# Patient Record
Sex: Male | Born: 1950
Health system: Southern US, Community
[De-identification: ages and names within clinical notes are randomized; demographics above are authoritative.]

## PROBLEM LIST (undated history)

## (undated) DIAGNOSIS — M1990 Unspecified osteoarthritis, unspecified site: Secondary | ICD-10-CM

## (undated) DIAGNOSIS — R112 Nausea with vomiting, unspecified: Secondary | ICD-10-CM

## (undated) DIAGNOSIS — K5730 Diverticulosis of large intestine without perforation or abscess without bleeding: Secondary | ICD-10-CM

## (undated) DIAGNOSIS — I2510 Atherosclerotic heart disease of native coronary artery without angina pectoris: Secondary | ICD-10-CM

## (undated) DIAGNOSIS — H269 Unspecified cataract: Secondary | ICD-10-CM

## (undated) DIAGNOSIS — Z5189 Encounter for other specified aftercare: Secondary | ICD-10-CM

## (undated) DIAGNOSIS — D509 Iron deficiency anemia, unspecified: Secondary | ICD-10-CM

## (undated) DIAGNOSIS — C801 Malignant (primary) neoplasm, unspecified: Secondary | ICD-10-CM

## (undated) DIAGNOSIS — IMO0002 Reserved for concepts with insufficient information to code with codable children: Secondary | ICD-10-CM

## (undated) DIAGNOSIS — E785 Hyperlipidemia, unspecified: Secondary | ICD-10-CM

## (undated) DIAGNOSIS — M545 Low back pain, unspecified: Secondary | ICD-10-CM

## (undated) DIAGNOSIS — N189 Chronic kidney disease, unspecified: Secondary | ICD-10-CM

## (undated) DIAGNOSIS — R945 Abnormal results of liver function studies: Principal | ICD-10-CM

## (undated) DIAGNOSIS — N200 Calculus of kidney: Secondary | ICD-10-CM

## (undated) DIAGNOSIS — E119 Type 2 diabetes mellitus without complications: Secondary | ICD-10-CM

## (undated) DIAGNOSIS — K219 Gastro-esophageal reflux disease without esophagitis: Secondary | ICD-10-CM

## (undated) DIAGNOSIS — K759 Inflammatory liver disease, unspecified: Secondary | ICD-10-CM

## (undated) DIAGNOSIS — Z9889 Other specified postprocedural states: Secondary | ICD-10-CM

## (undated) DIAGNOSIS — I10 Essential (primary) hypertension: Secondary | ICD-10-CM

## (undated) HISTORY — DX: Gastro-esophageal reflux disease without esophagitis: K21.9

## (undated) HISTORY — DX: Encounter for other specified aftercare: Z51.89

## (undated) HISTORY — DX: Unspecified cataract: H26.9

## (undated) HISTORY — DX: Type 2 diabetes mellitus without complications: E11.9

## (undated) HISTORY — DX: Diverticulosis of large intestine without perforation or abscess without bleeding: K57.30

## (undated) HISTORY — DX: Iron deficiency anemia, unspecified: D50.9

## (undated) HISTORY — PX: COLONOSCOPY: SHX174

## (undated) HISTORY — DX: Reserved for concepts with insufficient information to code with codable children: IMO0002

## (undated) HISTORY — PX: OTHER SURGICAL HISTORY: SHX169

## (undated) HISTORY — DX: Essential (primary) hypertension: I10

## (undated) HISTORY — DX: Atherosclerotic heart disease of native coronary artery without angina pectoris: I25.10

## (undated) HISTORY — DX: Low back pain, unspecified: M54.50

## (undated) HISTORY — DX: Calculus of kidney: N20.0

## (undated) HISTORY — PX: UPPER GASTROINTESTINAL ENDOSCOPY: SHX188

## (undated) HISTORY — DX: Malignant (primary) neoplasm, unspecified: C80.1

## (undated) HISTORY — DX: Hyperlipidemia, unspecified: E78.5

## (undated) HISTORY — DX: Abnormal results of liver function studies: R94.5

## (undated) HISTORY — PX: CORONARY ANGIOPLASTY WITH STENT PLACEMENT: SHX49

## (undated) HISTORY — DX: Low back pain: M54.5

---

## 1998-08-08 ENCOUNTER — Emergency Department (HOSPITAL_COMMUNITY): Admission: EM | Admit: 1998-08-08 | Discharge: 1998-08-08 | Payer: Self-pay | Admitting: Emergency Medicine

## 1998-08-08 ENCOUNTER — Ambulatory Visit (HOSPITAL_COMMUNITY): Admission: RE | Admit: 1998-08-08 | Discharge: 1998-08-08 | Payer: Self-pay | Admitting: Endocrinology

## 1998-09-13 ENCOUNTER — Other Ambulatory Visit: Admission: RE | Admit: 1998-09-13 | Discharge: 1998-09-13 | Payer: Self-pay | Admitting: Gastroenterology

## 2002-11-25 ENCOUNTER — Encounter: Payer: Self-pay | Admitting: Endocrinology

## 2002-11-25 ENCOUNTER — Ambulatory Visit (HOSPITAL_COMMUNITY): Admission: RE | Admit: 2002-11-25 | Discharge: 2002-11-25 | Payer: Self-pay | Admitting: Endocrinology

## 2004-10-05 ENCOUNTER — Ambulatory Visit: Payer: Self-pay | Admitting: Endocrinology

## 2004-10-12 ENCOUNTER — Ambulatory Visit: Payer: Self-pay | Admitting: Endocrinology

## 2004-11-14 ENCOUNTER — Ambulatory Visit: Payer: Self-pay | Admitting: *Deleted

## 2004-11-21 ENCOUNTER — Ambulatory Visit: Payer: Self-pay | Admitting: Internal Medicine

## 2004-11-30 ENCOUNTER — Ambulatory Visit: Payer: Self-pay

## 2004-11-30 HISTORY — PX: OTHER SURGICAL HISTORY: SHX169

## 2004-12-07 ENCOUNTER — Ambulatory Visit: Payer: Self-pay | Admitting: Cardiology

## 2004-12-07 ENCOUNTER — Ambulatory Visit: Payer: Self-pay | Admitting: *Deleted

## 2004-12-13 ENCOUNTER — Ambulatory Visit (HOSPITAL_COMMUNITY): Admission: RE | Admit: 2004-12-13 | Discharge: 2004-12-13 | Payer: Self-pay | Admitting: Cardiology

## 2004-12-13 ENCOUNTER — Ambulatory Visit: Payer: Self-pay | Admitting: *Deleted

## 2004-12-18 ENCOUNTER — Inpatient Hospital Stay (HOSPITAL_COMMUNITY): Admission: RE | Admit: 2004-12-18 | Discharge: 2004-12-20 | Payer: Self-pay | Admitting: *Deleted

## 2004-12-23 ENCOUNTER — Emergency Department (HOSPITAL_COMMUNITY): Admission: EM | Admit: 2004-12-23 | Discharge: 2004-12-23 | Payer: Self-pay | Admitting: Emergency Medicine

## 2004-12-28 ENCOUNTER — Ambulatory Visit: Payer: Self-pay | Admitting: Internal Medicine

## 2005-01-02 ENCOUNTER — Ambulatory Visit: Payer: Self-pay | Admitting: *Deleted

## 2005-02-21 ENCOUNTER — Ambulatory Visit: Payer: Self-pay | Admitting: *Deleted

## 2005-02-28 ENCOUNTER — Ambulatory Visit: Payer: Self-pay | Admitting: *Deleted

## 2005-03-22 ENCOUNTER — Ambulatory Visit: Payer: Self-pay | Admitting: Endocrinology

## 2005-05-16 ENCOUNTER — Ambulatory Visit: Payer: Self-pay | Admitting: Endocrinology

## 2005-05-24 ENCOUNTER — Ambulatory Visit: Payer: Self-pay | Admitting: *Deleted

## 2005-05-29 ENCOUNTER — Ambulatory Visit: Payer: Self-pay | Admitting: *Deleted

## 2005-09-16 ENCOUNTER — Ambulatory Visit: Payer: Self-pay | Admitting: *Deleted

## 2005-09-24 ENCOUNTER — Ambulatory Visit: Payer: Self-pay | Admitting: *Deleted

## 2005-09-27 ENCOUNTER — Ambulatory Visit: Payer: Self-pay | Admitting: Cardiology

## 2005-09-27 ENCOUNTER — Inpatient Hospital Stay (HOSPITAL_BASED_OUTPATIENT_CLINIC_OR_DEPARTMENT_OTHER): Admission: RE | Admit: 2005-09-27 | Discharge: 2005-09-27 | Payer: Self-pay | Admitting: Cardiology

## 2005-10-10 ENCOUNTER — Ambulatory Visit: Payer: Self-pay | Admitting: *Deleted

## 2005-10-14 ENCOUNTER — Ambulatory Visit: Payer: Self-pay | Admitting: Endocrinology

## 2005-10-22 ENCOUNTER — Ambulatory Visit: Payer: Self-pay | Admitting: Internal Medicine

## 2005-11-07 ENCOUNTER — Ambulatory Visit: Payer: Self-pay | Admitting: Internal Medicine

## 2005-11-07 HISTORY — PX: ESOPHAGOGASTRODUODENOSCOPY: SHX1529

## 2005-11-07 LAB — HM COLONOSCOPY

## 2005-12-16 ENCOUNTER — Ambulatory Visit: Payer: Self-pay | Admitting: Endocrinology

## 2006-01-20 ENCOUNTER — Ambulatory Visit: Payer: Self-pay | Admitting: Endocrinology

## 2006-02-05 ENCOUNTER — Ambulatory Visit: Payer: Self-pay | Admitting: *Deleted

## 2006-02-10 ENCOUNTER — Ambulatory Visit: Payer: Self-pay

## 2006-02-10 ENCOUNTER — Ambulatory Visit: Payer: Self-pay | Admitting: *Deleted

## 2006-02-11 ENCOUNTER — Ambulatory Visit: Payer: Self-pay | Admitting: Endocrinology

## 2006-02-13 ENCOUNTER — Ambulatory Visit: Payer: Self-pay | Admitting: Internal Medicine

## 2006-02-13 ENCOUNTER — Observation Stay (HOSPITAL_COMMUNITY): Admission: EM | Admit: 2006-02-13 | Discharge: 2006-02-14 | Payer: Self-pay | Admitting: *Deleted

## 2006-02-14 ENCOUNTER — Encounter: Payer: Self-pay | Admitting: Gastroenterology

## 2006-02-17 ENCOUNTER — Ambulatory Visit: Payer: Self-pay | Admitting: Endocrinology

## 2006-02-17 ENCOUNTER — Ambulatory Visit: Payer: Self-pay | Admitting: Gastroenterology

## 2006-02-24 ENCOUNTER — Ambulatory Visit (HOSPITAL_COMMUNITY): Admission: RE | Admit: 2006-02-24 | Discharge: 2006-02-24 | Payer: Self-pay | Admitting: Gastroenterology

## 2006-02-24 ENCOUNTER — Encounter: Payer: Self-pay | Admitting: Internal Medicine

## 2006-03-03 ENCOUNTER — Ambulatory Visit: Payer: Self-pay | Admitting: Endocrinology

## 2006-03-18 ENCOUNTER — Ambulatory Visit: Payer: Self-pay | Admitting: Internal Medicine

## 2006-04-21 ENCOUNTER — Ambulatory Visit: Payer: Self-pay | Admitting: Internal Medicine

## 2006-05-26 ENCOUNTER — Ambulatory Visit: Payer: Self-pay | Admitting: Endocrinology

## 2006-07-11 ENCOUNTER — Ambulatory Visit: Payer: Self-pay | Admitting: Endocrinology

## 2006-08-01 ENCOUNTER — Ambulatory Visit: Payer: Self-pay | Admitting: Endocrinology

## 2007-02-10 ENCOUNTER — Ambulatory Visit: Payer: Self-pay | Admitting: Endocrinology

## 2007-03-23 ENCOUNTER — Ambulatory Visit: Payer: Self-pay | Admitting: Endocrinology

## 2007-03-23 LAB — CONVERTED CEMR LAB
ALT: 30 units/L (ref 0–40)
AST: 24 units/L (ref 0–37)
Albumin: 4.3 g/dL (ref 3.5–5.2)
Alkaline Phosphatase: 39 units/L (ref 39–117)
BUN: 16 mg/dL (ref 6–23)
Basophils Absolute: 0.1 10*3/uL (ref 0.0–0.1)
Basophils Relative: 0.8 % (ref 0.0–1.0)
Bilirubin Urine: NEGATIVE
Bilirubin, Direct: 0.1 mg/dL (ref 0.0–0.3)
CO2: 33 meq/L — ABNORMAL HIGH (ref 19–32)
Calcium: 9.3 mg/dL (ref 8.4–10.5)
Chloride: 108 meq/L (ref 96–112)
Cholesterol: 153 mg/dL (ref 0–200)
Creatinine, Ser: 0.7 mg/dL (ref 0.4–1.5)
Eosinophils Absolute: 0.1 10*3/uL (ref 0.0–0.6)
Eosinophils Relative: 1.1 % (ref 0.0–5.0)
GFR calc Af Amer: 151 mL/min
GFR calc non Af Amer: 125 mL/min
Glucose, Bld: 98 mg/dL (ref 70–99)
HCT: 43.9 % (ref 39.0–52.0)
HDL: 45.8 mg/dL (ref >39.0)
Hemoglobin, Urine: NEGATIVE
Hemoglobin: 15.3 g/dL (ref 13.0–17.0)
Iron: 59 ug/dL (ref 42–165)
Ketones, ur: NEGATIVE mg/dL
LDL Cholesterol: 74 mg/dL (ref 0–99)
Leukocytes, UA: NEGATIVE
Lymphocytes Relative: 16.1 % (ref 12.0–46.0)
MCHC: 34.8 g/dL (ref 30.0–36.0)
MCV: 93.5 fL (ref 78.0–100.0)
Monocytes Absolute: 0.5 10*3/uL (ref 0.2–0.7)
Monocytes Relative: 7.8 % (ref 3.0–11.0)
Neutro Abs: 4.7 10*3/uL (ref 1.4–7.7)
Neutrophils Relative %: 74.2 % (ref 43.0–77.0)
Nitrite: NEGATIVE
PSA: 0.39 ng/mL (ref 0.10–4.00)
Platelets: 240 10*3/uL (ref 150–400)
Potassium: 4.4 meq/L (ref 3.5–5.1)
RBC: 4.69 M/uL (ref 4.22–5.81)
RDW: 13.7 % (ref 11.5–14.6)
Saturation Ratios: 14.4 % — ABNORMAL LOW (ref 20.0–50.0)
Sodium: 144 meq/L (ref 135–145)
Specific Gravity, Urine: 1.02 (ref 1.000–1.03)
TSH: 1.61 microintl units/mL (ref 0.35–5.50)
Total Bilirubin: 0.7 mg/dL (ref 0.3–1.2)
Total CHOL/HDL Ratio: 3.3
Total Protein, Urine: NEGATIVE mg/dL
Total Protein: 6.8 g/dL (ref 6.0–8.3)
Transferrin: 293.1 mg/dL (ref >=212.0)
Triglycerides: 168 mg/dL — ABNORMAL HIGH (ref 0–149)
Urine Glucose: NEGATIVE mg/dL
Urobilinogen, UA: 0.2 (ref 0.0–1.0)
VLDL: 34 mg/dL (ref 0–40)
WBC: 6.4 10*3/uL (ref 4.5–10.5)
pH: 6 (ref 5.0–8.0)

## 2007-03-30 ENCOUNTER — Ambulatory Visit: Payer: Self-pay | Admitting: Endocrinology

## 2007-03-30 HISTORY — PX: ELECTROCARDIOGRAM: SHX264

## 2007-06-02 ENCOUNTER — Encounter: Payer: Self-pay | Admitting: Endocrinology

## 2007-06-02 DIAGNOSIS — I10 Essential (primary) hypertension: Secondary | ICD-10-CM | POA: Insufficient documentation

## 2007-06-02 DIAGNOSIS — I2510 Atherosclerotic heart disease of native coronary artery without angina pectoris: Secondary | ICD-10-CM | POA: Insufficient documentation

## 2007-06-02 DIAGNOSIS — K219 Gastro-esophageal reflux disease without esophagitis: Secondary | ICD-10-CM | POA: Insufficient documentation

## 2007-06-02 DIAGNOSIS — E785 Hyperlipidemia, unspecified: Secondary | ICD-10-CM | POA: Insufficient documentation

## 2007-08-11 ENCOUNTER — Ambulatory Visit: Payer: Self-pay | Admitting: Cardiology

## 2007-08-17 ENCOUNTER — Ambulatory Visit: Payer: Self-pay

## 2007-11-10 ENCOUNTER — Encounter: Payer: Self-pay | Admitting: Internal Medicine

## 2007-11-19 ENCOUNTER — Telehealth (INDEPENDENT_AMBULATORY_CARE_PROVIDER_SITE_OTHER): Payer: Self-pay | Admitting: *Deleted

## 2007-11-20 ENCOUNTER — Ambulatory Visit: Payer: Self-pay | Admitting: Internal Medicine

## 2007-11-20 DIAGNOSIS — J069 Acute upper respiratory infection, unspecified: Secondary | ICD-10-CM | POA: Insufficient documentation

## 2008-01-20 ENCOUNTER — Ambulatory Visit: Payer: Self-pay | Admitting: Endocrinology

## 2008-01-20 DIAGNOSIS — E119 Type 2 diabetes mellitus without complications: Secondary | ICD-10-CM | POA: Insufficient documentation

## 2008-01-20 DIAGNOSIS — R05 Cough: Secondary | ICD-10-CM

## 2008-01-21 LAB — CONVERTED CEMR LAB: Hgb A1c MFr Bld: 5.8 % (ref 4.6–6.0)

## 2008-02-12 ENCOUNTER — Ambulatory Visit: Payer: Self-pay | Admitting: Endocrinology

## 2008-02-17 ENCOUNTER — Encounter: Payer: Self-pay | Admitting: Endocrinology

## 2008-05-19 ENCOUNTER — Ambulatory Visit: Payer: Self-pay | Admitting: Endocrinology

## 2008-06-06 ENCOUNTER — Ambulatory Visit: Payer: Self-pay | Admitting: Endocrinology

## 2008-06-07 LAB — CONVERTED CEMR LAB
ALT: 40 units/L (ref 0–53)
AST: 34 units/L (ref 0–37)
Albumin: 4.4 g/dL (ref 3.5–5.2)
Alkaline Phosphatase: 57 units/L (ref 39–117)
BUN: 19 mg/dL (ref 6–23)
Basophils Absolute: 0 10*3/uL (ref 0.0–0.1)
Basophils Relative: 0.7 % (ref 0.0–3.0)
Bilirubin Urine: NEGATIVE
Bilirubin, Direct: 0.1 mg/dL (ref 0.0–0.3)
CO2: 28 meq/L (ref 19–32)
Calcium: 9.1 mg/dL (ref 8.4–10.5)
Chloride: 108 meq/L (ref 96–112)
Cholesterol: 135 mg/dL (ref 0–200)
Creatinine, Ser: 0.8 mg/dL (ref 0.4–1.5)
Eosinophils Absolute: 0.1 10*3/uL (ref 0.0–0.7)
Eosinophils Relative: 1.8 % (ref 0.0–5.0)
GFR calc Af Amer: 129 mL/min
GFR calc non Af Amer: 106 mL/min
Glucose, Bld: 118 mg/dL — ABNORMAL HIGH (ref 70–99)
HCT: 42.6 % (ref 39.0–52.0)
HDL: 42.2 mg/dL (ref >39.0)
Hemoglobin, Urine: NEGATIVE
Hemoglobin: 14.5 g/dL (ref 13.0–17.0)
Ketones, ur: NEGATIVE mg/dL
LDL Cholesterol: 63 mg/dL (ref 0–99)
Leukocytes, UA: NEGATIVE
Lymphocytes Relative: 21.7 % (ref 12.0–46.0)
MCHC: 34.1 g/dL (ref 30.0–36.0)
MCV: 96.1 fL (ref 78.0–100.0)
Monocytes Absolute: 0.6 10*3/uL (ref 0.1–1.0)
Monocytes Relative: 10.4 % (ref 3.0–12.0)
Neutro Abs: 4.2 10*3/uL (ref 1.4–7.7)
Neutrophils Relative %: 65.4 % (ref 43.0–77.0)
Nitrite: NEGATIVE
PSA: 0.35 ng/mL (ref 0.10–4.00)
Platelets: 243 10*3/uL (ref 150–400)
Potassium: 4.2 meq/L (ref 3.5–5.1)
RBC: 4.43 M/uL (ref 4.22–5.81)
RDW: 13.5 % (ref 11.5–14.6)
Sodium: 143 meq/L (ref 135–145)
Specific Gravity, Urine: 1.015 (ref 1.000–1.03)
TSH: 1.36 microintl units/mL (ref 0.35–5.50)
Total Bilirubin: 0.5 mg/dL (ref 0.3–1.2)
Total CHOL/HDL Ratio: 3.2
Total Protein, Urine: NEGATIVE mg/dL
Total Protein: 6.9 g/dL (ref 6.0–8.3)
Triglycerides: 149 mg/dL (ref 0–149)
Urine Glucose: NEGATIVE mg/dL
Urobilinogen, UA: 0.2 (ref 0.0–1.0)
VLDL: 30 mg/dL (ref 0–40)
WBC: 6.2 10*3/uL (ref 4.5–10.5)
pH: 6 (ref 5.0–8.0)

## 2008-06-13 ENCOUNTER — Ambulatory Visit: Payer: Self-pay | Admitting: Endocrinology

## 2008-06-13 LAB — CONVERTED CEMR LAB: Hgb A1c MFr Bld: 5.7 % (ref 4.6–6.0)

## 2008-07-26 ENCOUNTER — Ambulatory Visit: Payer: Self-pay | Admitting: Endocrinology

## 2008-07-26 DIAGNOSIS — IMO0002 Reserved for concepts with insufficient information to code with codable children: Secondary | ICD-10-CM | POA: Insufficient documentation

## 2008-07-26 DIAGNOSIS — R61 Generalized hyperhidrosis: Secondary | ICD-10-CM | POA: Insufficient documentation

## 2008-07-26 LAB — CONVERTED CEMR LAB
Bilirubin Urine: NEGATIVE
Blood in Urine, dipstick: NEGATIVE
Glucose, Urine, Semiquant: NEGATIVE
Ketones, urine, test strip: NEGATIVE
Nitrite: NEGATIVE
Protein, U semiquant: NEGATIVE
Specific Gravity, Urine: <1.005
Urobilinogen, UA: 0.2
WBC Urine, dipstick: NEGATIVE
pH: 5

## 2008-07-28 LAB — CONVERTED CEMR LAB
Basophils Absolute: 0.1 10*3/uL (ref 0.0–0.1)
Basophils Relative: 0.8 % (ref 0.0–3.0)
Eosinophils Absolute: 0.1 10*3/uL (ref 0.0–0.7)
Eosinophils Relative: 1.9 % (ref 0.0–5.0)
HCT: 42.7 % (ref 39.0–52.0)
Hemoglobin: 15.2 g/dL (ref 13.0–17.0)
Lymphocytes Relative: 15.9 % (ref 12.0–46.0)
MCHC: 35.7 g/dL (ref 30.0–36.0)
MCV: 94.3 fL (ref 78.0–100.0)
Monocytes Absolute: 0.6 10*3/uL (ref 0.1–1.0)
Monocytes Relative: 7.6 % (ref 3.0–12.0)
Neutro Abs: 5.7 10*3/uL (ref 1.4–7.7)
Neutrophils Relative %: 73.8 % (ref 43.0–77.0)
Platelets: 205 10*3/uL (ref 150–400)
RBC: 4.52 M/uL (ref 4.22–5.81)
RDW: 13.1 % (ref 11.5–14.6)
WBC: 7.7 10*3/uL (ref 4.5–10.5)

## 2008-08-29 ENCOUNTER — Ambulatory Visit: Payer: Self-pay | Admitting: Endocrinology

## 2008-08-29 LAB — CONVERTED CEMR LAB
Basophils Absolute: 0 10*3/uL (ref 0.0–0.1)
Basophils Relative: 0.3 % (ref 0.0–3.0)
Eosinophils Absolute: 0.1 10*3/uL (ref 0.0–0.7)
Eosinophils Relative: 2.1 % (ref 0.0–5.0)
HCT: 43.8 % (ref 39.0–52.0)
Hemoglobin: 15.4 g/dL (ref 13.0–17.0)
Hgb A1c MFr Bld: 5.6 % (ref 4.6–6.0)
Iron: 74 ug/dL (ref 42–165)
Lymphocytes Relative: 20 % (ref 12.0–46.0)
MCHC: 35.1 g/dL (ref 30.0–36.0)
MCV: 95 fL (ref 78.0–100.0)
Monocytes Absolute: 0.7 10*3/uL (ref 0.1–1.0)
Monocytes Relative: 10.2 % (ref 3.0–12.0)
Neutro Abs: 4.3 10*3/uL (ref 1.4–7.7)
Neutrophils Relative %: 67.4 % (ref 43.0–77.0)
Platelets: 202 10*3/uL (ref 150–400)
RBC: 4.62 M/uL (ref 4.22–5.81)
RDW: 13.1 % (ref 11.5–14.6)
WBC: 6.4 10*3/uL (ref 4.5–10.5)

## 2008-09-01 ENCOUNTER — Ambulatory Visit: Payer: Self-pay | Admitting: Endocrinology

## 2008-09-01 DIAGNOSIS — M545 Low back pain: Secondary | ICD-10-CM

## 2008-09-01 LAB — CONVERTED CEMR LAB
Bilirubin Urine: NEGATIVE
Glucose, Urine, Semiquant: NEGATIVE
Ketones, urine, test strip: NEGATIVE
Nitrite: NEGATIVE
Protein, U semiquant: NEGATIVE
Specific Gravity, Urine: <1.005
Urobilinogen, UA: 0.2
WBC Urine, dipstick: NEGATIVE
pH: 5

## 2008-09-06 ENCOUNTER — Telehealth (INDEPENDENT_AMBULATORY_CARE_PROVIDER_SITE_OTHER): Payer: Self-pay | Admitting: *Deleted

## 2008-09-07 ENCOUNTER — Telehealth (INDEPENDENT_AMBULATORY_CARE_PROVIDER_SITE_OTHER): Payer: Self-pay | Admitting: *Deleted

## 2008-09-08 ENCOUNTER — Ambulatory Visit: Payer: Self-pay | Admitting: Endocrinology

## 2008-09-08 LAB — CONVERTED CEMR LAB
Bilirubin Urine: NEGATIVE
Glucose, Urine, Semiquant: NEGATIVE
Ketones, urine, test strip: NEGATIVE
Nitrite: NEGATIVE
Protein, U semiquant: NEGATIVE
Specific Gravity, Urine: 1.015
Urobilinogen, UA: 0.2
WBC Urine, dipstick: NEGATIVE
pH: 5

## 2008-09-09 ENCOUNTER — Ambulatory Visit: Payer: Self-pay | Admitting: Internal Medicine

## 2008-09-14 ENCOUNTER — Encounter: Payer: Self-pay | Admitting: Endocrinology

## 2008-11-18 ENCOUNTER — Ambulatory Visit: Payer: Self-pay | Admitting: Internal Medicine

## 2008-11-18 DIAGNOSIS — D509 Iron deficiency anemia, unspecified: Secondary | ICD-10-CM | POA: Insufficient documentation

## 2008-11-18 DIAGNOSIS — IMO0002 Reserved for concepts with insufficient information to code with codable children: Secondary | ICD-10-CM | POA: Insufficient documentation

## 2008-11-18 DIAGNOSIS — K5730 Diverticulosis of large intestine without perforation or abscess without bleeding: Secondary | ICD-10-CM | POA: Insufficient documentation

## 2008-11-18 HISTORY — DX: Iron deficiency anemia, unspecified: D50.9

## 2009-02-18 ENCOUNTER — Ambulatory Visit: Payer: Self-pay | Admitting: Family Medicine

## 2009-02-18 DIAGNOSIS — J019 Acute sinusitis, unspecified: Secondary | ICD-10-CM | POA: Insufficient documentation

## 2009-06-08 ENCOUNTER — Ambulatory Visit: Payer: Self-pay | Admitting: Internal Medicine

## 2009-06-30 ENCOUNTER — Ambulatory Visit: Payer: Self-pay | Admitting: Internal Medicine

## 2009-06-30 LAB — CONVERTED CEMR LAB
ALT: 42 units/L (ref 0–53)
AST: 31 units/L (ref 0–37)
Albumin: 4.5 g/dL (ref 3.5–5.2)
Alkaline Phosphatase: 52 units/L (ref 39–117)
BUN: 16 mg/dL (ref 6–23)
Basophils Absolute: 0.1 10*3/uL (ref 0.0–0.1)
Basophils Relative: 1.2 % (ref 0.0–3.0)
Bilirubin Urine: NEGATIVE
Bilirubin, Direct: 0 mg/dL (ref 0.0–0.3)
CO2: 29 meq/L (ref 19–32)
Calcium: 9.7 mg/dL (ref 8.4–10.5)
Chloride: 112 meq/L (ref 96–112)
Cholesterol: 140 mg/dL (ref 0–200)
Creatinine, Ser: 0.8 mg/dL (ref 0.4–1.5)
Eosinophils Absolute: 0.1 10*3/uL (ref 0.0–0.7)
Eosinophils Relative: 2.3 % (ref 0.0–5.0)
GFR calc non Af Amer: 105.81 mL/min (ref >60)
Glucose, Bld: 121 mg/dL — ABNORMAL HIGH (ref 70–99)
HCT: 44.6 % (ref 39.0–52.0)
HDL: 46.3 mg/dL (ref >39.00)
Hemoglobin, Urine: NEGATIVE
Hemoglobin: 15.5 g/dL (ref 13.0–17.0)
Ketones, ur: NEGATIVE mg/dL
LDL Cholesterol: 60 mg/dL (ref 0–99)
Leukocytes, UA: NEGATIVE
Lymphocytes Relative: 19.4 % (ref 12.0–46.0)
Lymphs Abs: 1.3 10*3/uL (ref 0.7–4.0)
MCHC: 34.7 g/dL (ref 30.0–36.0)
MCV: 94.8 fL (ref 78.0–100.0)
Monocytes Absolute: 0.7 10*3/uL (ref 0.1–1.0)
Monocytes Relative: 11.1 % (ref 3.0–12.0)
Neutro Abs: 4.3 10*3/uL (ref 1.4–7.7)
Neutrophils Relative %: 66 % (ref 43.0–77.0)
Nitrite: NEGATIVE
Platelets: 184 10*3/uL (ref 150.0–400.0)
Potassium: 4.3 meq/L (ref 3.5–5.1)
RBC: 4.7 M/uL (ref 4.22–5.81)
RDW: 13 % (ref 11.5–14.6)
Sodium: 145 meq/L (ref 135–145)
Specific Gravity, Urine: >=1.03 (ref 1.000–1.030)
TSH: 1.91 microintl units/mL (ref 0.35–5.50)
Total Bilirubin: 0.6 mg/dL (ref 0.3–1.2)
Total CHOL/HDL Ratio: 3
Total Protein, Urine: NEGATIVE mg/dL
Total Protein: 7.1 g/dL (ref 6.0–8.3)
Triglycerides: 170 mg/dL — ABNORMAL HIGH (ref 0.0–149.0)
Urine Glucose: NEGATIVE mg/dL
Urobilinogen, UA: 0.2 (ref 0.0–1.0)
VLDL: 34 mg/dL (ref 0.0–40.0)
WBC: 6.5 10*3/uL (ref 4.5–10.5)
pH: 5.5 (ref 5.0–8.0)

## 2009-07-01 ENCOUNTER — Encounter: Payer: Self-pay | Admitting: Internal Medicine

## 2009-07-11 DIAGNOSIS — R42 Dizziness and giddiness: Secondary | ICD-10-CM | POA: Insufficient documentation

## 2009-07-11 DIAGNOSIS — F17200 Nicotine dependence, unspecified, uncomplicated: Secondary | ICD-10-CM | POA: Insufficient documentation

## 2009-07-14 ENCOUNTER — Ambulatory Visit: Payer: Self-pay | Admitting: Cardiology

## 2009-07-19 ENCOUNTER — Telehealth: Payer: Self-pay | Admitting: Cardiology

## 2009-07-28 ENCOUNTER — Ambulatory Visit: Payer: Self-pay | Admitting: Endocrinology

## 2009-07-28 LAB — CONVERTED CEMR LAB: Hgb A1c MFr Bld: 5.9 % (ref 4.6–6.5)

## 2009-08-18 ENCOUNTER — Ambulatory Visit: Payer: Self-pay | Admitting: Internal Medicine

## 2009-08-18 ENCOUNTER — Encounter: Payer: Self-pay | Admitting: Cardiology

## 2009-08-18 ENCOUNTER — Ambulatory Visit (HOSPITAL_COMMUNITY): Admission: RE | Admit: 2009-08-18 | Discharge: 2009-08-18 | Payer: Self-pay | Admitting: Internal Medicine

## 2009-08-18 ENCOUNTER — Ambulatory Visit: Payer: Self-pay

## 2010-01-18 ENCOUNTER — Ambulatory Visit: Payer: Self-pay | Admitting: Endocrinology

## 2010-01-18 DIAGNOSIS — M25551 Pain in right hip: Secondary | ICD-10-CM | POA: Insufficient documentation

## 2010-01-18 DIAGNOSIS — M2550 Pain in unspecified joint: Secondary | ICD-10-CM | POA: Insufficient documentation

## 2010-01-18 LAB — CONVERTED CEMR LAB
BUN: 22 mg/dL (ref 6–23)
Basophils Absolute: 0.1 10*3/uL (ref 0.0–0.1)
Basophils Relative: 1.2 % (ref 0.0–3.0)
CO2: 29 meq/L (ref 19–32)
Calcium: 8.9 mg/dL (ref 8.4–10.5)
Chloride: 106 meq/L (ref 96–112)
Creatinine, Ser: 0.6 mg/dL (ref 0.4–1.5)
Eosinophils Absolute: 0.1 10*3/uL (ref 0.0–0.7)
Eosinophils Relative: 1.4 % (ref 0.0–5.0)
GFR calc non Af Amer: 147.19 mL/min (ref >60)
Glucose, Bld: 118 mg/dL — ABNORMAL HIGH (ref 70–99)
HCT: 25.6 % — ABNORMAL LOW (ref 39.0–52.0)
Hemoglobin: 8.3 g/dL — ABNORMAL LOW (ref 13.0–17.0)
Hgb A1c MFr Bld: 4.4 % — ABNORMAL LOW (ref 4.6–6.5)
Lymphocytes Relative: 15.3 % (ref 12.0–46.0)
Lymphs Abs: 1.2 10*3/uL (ref 0.7–4.0)
MCHC: 32.5 g/dL (ref 30.0–36.0)
MCV: 98.1 fL (ref 78.0–100.0)
Monocytes Absolute: 0.6 10*3/uL (ref 0.1–1.0)
Monocytes Relative: 8.1 % (ref 3.0–12.0)
Neutro Abs: 6 10*3/uL (ref 1.4–7.7)
Neutrophils Relative %: 74 % (ref 43.0–77.0)
Platelets: 240 10*3/uL (ref 150.0–400.0)
Potassium: 4.5 meq/L (ref 3.5–5.1)
RBC: 2.61 M/uL — ABNORMAL LOW (ref 4.22–5.81)
RDW: 14.7 % — ABNORMAL HIGH (ref 11.5–14.6)
Sed Rate: 13 mm/hr (ref 0–22)
Sodium: 141 meq/L (ref 135–145)
WBC: 8 10*3/uL (ref 4.5–10.5)

## 2010-01-19 ENCOUNTER — Telehealth (INDEPENDENT_AMBULATORY_CARE_PROVIDER_SITE_OTHER): Payer: Self-pay | Admitting: *Deleted

## 2010-01-19 ENCOUNTER — Ambulatory Visit: Payer: Self-pay | Admitting: Endocrinology

## 2010-01-22 LAB — CONVERTED CEMR LAB
Basophils Absolute: 0.1 10*3/uL (ref 0.0–0.1)
Basophils Relative: 0.9 % (ref 0.0–3.0)
Eosinophils Absolute: 0.1 10*3/uL (ref 0.0–0.7)
Eosinophils Relative: 1.1 % (ref 0.0–5.0)
HCT: 25.8 % — ABNORMAL LOW (ref 39.0–52.0)
Hemoglobin: 8.6 g/dL — ABNORMAL LOW (ref 13.0–17.0)
Iron: 28 ug/dL — ABNORMAL LOW (ref 42–165)
Lymphocytes Relative: 14.4 % (ref 12.0–46.0)
Lymphs Abs: 1.1 10*3/uL (ref 0.7–4.0)
MCHC: 33.4 g/dL (ref 30.0–36.0)
MCV: 99.3 fL (ref 78.0–100.0)
Monocytes Absolute: 0.6 10*3/uL (ref 0.1–1.0)
Monocytes Relative: 8.5 % (ref 3.0–12.0)
Neutro Abs: 5.4 10*3/uL (ref 1.4–7.7)
Neutrophils Relative %: 75.1 % (ref 43.0–77.0)
Platelets: 240 10*3/uL (ref 150.0–400.0)
RBC: 2.6 M/uL — ABNORMAL LOW (ref 4.22–5.81)
RDW: 15.2 % — ABNORMAL HIGH (ref 11.5–14.6)
Saturation Ratios: 6.4 % — ABNORMAL LOW (ref 20.0–50.0)
Transferrin: 313.2 mg/dL (ref 212.0–360.0)
Vitamin B-12: 520 pg/mL (ref 211–911)
WBC: 7.3 10*3/uL (ref 4.5–10.5)

## 2010-02-01 ENCOUNTER — Ambulatory Visit: Payer: Self-pay | Admitting: Endocrinology

## 2010-02-01 LAB — CONVERTED CEMR LAB
Basophils Absolute: 0.1 10*3/uL (ref 0.0–0.1)
Basophils Relative: 1.1 % (ref 0.0–3.0)
Eosinophils Absolute: 0.1 10*3/uL (ref 0.0–0.7)
Eosinophils Relative: 1.1 % (ref 0.0–5.0)
HCT: 31 % — ABNORMAL LOW (ref 39.0–52.0)
Hemoglobin: 10.1 g/dL — ABNORMAL LOW (ref 13.0–17.0)
Iron: 181 ug/dL — ABNORMAL HIGH (ref 42–165)
Lymphocytes Relative: 16.8 % (ref 12.0–46.0)
Lymphs Abs: 0.9 10*3/uL (ref 0.7–4.0)
MCHC: 32.5 g/dL (ref 30.0–36.0)
MCV: 102.5 fL — ABNORMAL HIGH (ref 78.0–100.0)
Monocytes Absolute: 0.5 10*3/uL (ref 0.1–1.0)
Monocytes Relative: 9.9 % (ref 3.0–12.0)
Neutro Abs: 3.7 10*3/uL (ref 1.4–7.7)
Neutrophils Relative %: 71.1 % (ref 43.0–77.0)
Platelets: 251 10*3/uL (ref 150.0–400.0)
RBC: 3.02 M/uL — ABNORMAL LOW (ref 4.22–5.81)
RDW: 17.2 % — ABNORMAL HIGH (ref 11.5–14.6)
Saturation Ratios: 43.5 % (ref 20.0–50.0)
Transferrin: 297.2 mg/dL (ref 212.0–360.0)
WBC: 5.3 10*3/uL (ref 4.5–10.5)

## 2010-02-16 ENCOUNTER — Encounter: Payer: Self-pay | Admitting: Endocrinology

## 2010-03-09 ENCOUNTER — Ambulatory Visit: Payer: Self-pay | Admitting: Endocrinology

## 2010-03-09 LAB — CONVERTED CEMR LAB
ALT: 30 units/L (ref 0–53)
AST: 23 units/L (ref 0–37)
Albumin: 4.5 g/dL (ref 3.5–5.2)
Alkaline Phosphatase: 46 units/L (ref 39–117)
BUN: 16 mg/dL (ref 6–23)
Basophils Absolute: 0.1 10*3/uL (ref 0.0–0.1)
Basophils Relative: 1.1 % (ref 0.0–3.0)
Bilirubin Urine: NEGATIVE
Bilirubin, Direct: 0 mg/dL (ref 0.0–0.3)
CO2: 28 meq/L (ref 19–32)
Calcium: 9.1 mg/dL (ref 8.4–10.5)
Chloride: 106 meq/L (ref 96–112)
Creatinine, Ser: 0.7 mg/dL (ref 0.4–1.5)
Eosinophils Absolute: 0 10*3/uL (ref 0.0–0.7)
Eosinophils Relative: 0.8 % (ref 0.0–5.0)
GFR calc non Af Amer: 123.14 mL/min (ref >60)
Glucose, Bld: 88 mg/dL (ref 70–99)
HCT: 32.4 % — ABNORMAL LOW (ref 39.0–52.0)
Hemoglobin, Urine: NEGATIVE
Hemoglobin: 10.8 g/dL — ABNORMAL LOW (ref 13.0–17.0)
Hgb A1c MFr Bld: 3.9 % — ABNORMAL LOW (ref 4.6–6.5)
Iron: 29 ug/dL — ABNORMAL LOW (ref 42–165)
Ketones, ur: NEGATIVE mg/dL
Lymphocytes Relative: 14.4 % (ref 12.0–46.0)
Lymphs Abs: 0.9 10*3/uL (ref 0.7–4.0)
MCHC: 33.3 g/dL (ref 30.0–36.0)
MCV: 98 fL (ref 78.0–100.0)
Monocytes Absolute: 0.6 10*3/uL (ref 0.1–1.0)
Monocytes Relative: 9.5 % (ref 3.0–12.0)
Neutro Abs: 4.5 10*3/uL (ref 1.4–7.7)
Neutrophils Relative %: 74.2 % (ref 43.0–77.0)
Nitrite: NEGATIVE
Platelets: 292 10*3/uL (ref 150.0–400.0)
Potassium: 4 meq/L (ref 3.5–5.1)
RBC: 3.31 M/uL — ABNORMAL LOW (ref 4.22–5.81)
RDW: 15.1 % — ABNORMAL HIGH (ref 11.5–14.6)
Saturation Ratios: 6.9 % — ABNORMAL LOW (ref 20.0–50.0)
Sed Rate: 10 mm/hr (ref 0–22)
Sodium: 141 meq/L (ref 135–145)
Specific Gravity, Urine: >=1.03 (ref 1.000–1.030)
TSH: 1.83 microintl units/mL (ref 0.35–5.50)
Total Bilirubin: 0.3 mg/dL (ref 0.3–1.2)
Total Protein, Urine: NEGATIVE mg/dL
Total Protein: 6.5 g/dL (ref 6.0–8.3)
Transferrin: 300.7 mg/dL (ref 212.0–360.0)
Urine Glucose: NEGATIVE mg/dL
Urobilinogen, UA: 0.2 (ref 0.0–1.0)
WBC: 6 10*3/uL (ref 4.5–10.5)
pH: 5.5 (ref 5.0–8.0)

## 2010-03-10 ENCOUNTER — Encounter: Payer: Self-pay | Admitting: Endocrinology

## 2010-06-15 ENCOUNTER — Emergency Department (HOSPITAL_COMMUNITY): Admission: EM | Admit: 2010-06-15 | Discharge: 2010-06-16 | Payer: Self-pay | Admitting: Emergency Medicine

## 2010-06-15 ENCOUNTER — Ambulatory Visit: Payer: Self-pay | Admitting: Endocrinology

## 2010-06-15 DIAGNOSIS — R109 Unspecified abdominal pain: Secondary | ICD-10-CM | POA: Insufficient documentation

## 2010-06-15 LAB — CONVERTED CEMR LAB
ALT: 26 units/L (ref 0–53)
AST: 20 units/L (ref 0–37)
Albumin: 4.3 g/dL (ref 3.5–5.2)
Alkaline Phosphatase: 35 units/L — ABNORMAL LOW (ref 39–117)
Amylase: 17 units/L — ABNORMAL LOW (ref 27–131)
BUN: 18 mg/dL (ref 6–23)
Basophils Absolute: 0 10*3/uL (ref 0.0–0.1)
Basophils Relative: 0.4 % (ref 0.0–3.0)
Bilirubin, Direct: 0.1 mg/dL (ref 0.0–0.3)
CO2: 27 meq/L (ref 19–32)
Calcium: 9.1 mg/dL (ref 8.4–10.5)
Chloride: 108 meq/L (ref 96–112)
Creatinine, Ser: 0.7 mg/dL (ref 0.4–1.5)
Eosinophils Absolute: 0 10*3/uL (ref 0.0–0.7)
Eosinophils Relative: 0.7 % (ref 0.0–5.0)
GFR calc non Af Amer: 119.09 mL/min (ref >60)
Glucose, Bld: 107 mg/dL — ABNORMAL HIGH (ref 70–99)
HCT: 23.6 % — CL (ref 39.0–52.0)
Hemoglobin: 7.9 g/dL — CL (ref 13.0–17.0)
Hgb A1c MFr Bld: 3.4 % — ABNORMAL LOW (ref 4.6–6.5)
Iron: 53 ug/dL (ref 42–165)
Lymphocytes Relative: 13.8 % (ref 12.0–46.0)
Lymphs Abs: 1 10*3/uL (ref 0.7–4.0)
MCHC: 33.4 g/dL (ref 30.0–36.0)
MCV: 101.5 fL — ABNORMAL HIGH (ref 78.0–100.0)
Monocytes Absolute: 0.6 10*3/uL (ref 0.1–1.0)
Monocytes Relative: 7.7 % (ref 3.0–12.0)
Neutro Abs: 5.6 10*3/uL (ref 1.4–7.7)
Neutrophils Relative %: 77.4 % — ABNORMAL HIGH (ref 43.0–77.0)
Platelets: 278 10*3/uL (ref 150.0–400.0)
Potassium: 4 meq/L (ref 3.5–5.1)
RBC: 2.33 M/uL — ABNORMAL LOW (ref 4.22–5.81)
RDW: 15.5 % — ABNORMAL HIGH (ref 11.5–14.6)
Saturation Ratios: 13.1 % — ABNORMAL LOW (ref 20.0–50.0)
Sodium: 142 meq/L (ref 135–145)
Total Bilirubin: 0.2 mg/dL — ABNORMAL LOW (ref 0.3–1.2)
Total Protein: 6.3 g/dL (ref 6.0–8.3)
Transferrin: 288.7 mg/dL (ref 212.0–360.0)
WBC: 7.3 10*3/uL (ref 4.5–10.5)

## 2010-06-18 ENCOUNTER — Telehealth: Payer: Self-pay | Admitting: Internal Medicine

## 2010-06-18 ENCOUNTER — Ambulatory Visit: Payer: Self-pay | Admitting: Internal Medicine

## 2010-06-18 DIAGNOSIS — K625 Hemorrhage of anus and rectum: Secondary | ICD-10-CM | POA: Insufficient documentation

## 2010-06-18 DIAGNOSIS — K648 Other hemorrhoids: Secondary | ICD-10-CM | POA: Insufficient documentation

## 2010-06-18 DIAGNOSIS — D649 Anemia, unspecified: Secondary | ICD-10-CM | POA: Insufficient documentation

## 2010-06-19 ENCOUNTER — Encounter (HOSPITAL_COMMUNITY): Admission: RE | Admit: 2010-06-19 | Discharge: 2010-06-20 | Payer: Self-pay | Admitting: Internal Medicine

## 2010-06-19 ENCOUNTER — Telehealth: Payer: Self-pay | Admitting: Nurse Practitioner

## 2010-06-21 ENCOUNTER — Telehealth: Payer: Self-pay | Admitting: Internal Medicine

## 2010-06-21 ENCOUNTER — Encounter (INDEPENDENT_AMBULATORY_CARE_PROVIDER_SITE_OTHER): Payer: Self-pay | Admitting: *Deleted

## 2010-07-04 ENCOUNTER — Telehealth: Payer: Self-pay | Admitting: Internal Medicine

## 2010-07-05 ENCOUNTER — Encounter (INDEPENDENT_AMBULATORY_CARE_PROVIDER_SITE_OTHER): Payer: Self-pay | Admitting: *Deleted

## 2010-07-06 ENCOUNTER — Ambulatory Visit: Payer: Self-pay | Admitting: Internal Medicine

## 2010-07-06 ENCOUNTER — Telehealth: Payer: Self-pay | Admitting: Internal Medicine

## 2010-07-09 LAB — CONVERTED CEMR LAB
Basophils Absolute: 0.1 10*3/uL (ref 0.0–0.1)
Basophils Relative: 1 % (ref 0.0–3.0)
Eosinophils Absolute: 0.1 10*3/uL (ref 0.0–0.7)
Eosinophils Relative: 1.3 % (ref 0.0–5.0)
Folate: 18.4 ng/mL
HCT: 28.9 % — ABNORMAL LOW (ref 39.0–52.0)
Hemoglobin: 9.6 g/dL — ABNORMAL LOW (ref 13.0–17.0)
Lymphocytes Relative: 13.6 % (ref 12.0–46.0)
Lymphs Abs: 0.8 10*3/uL (ref 0.7–4.0)
MCHC: 33.2 g/dL (ref 30.0–36.0)
MCV: 100 fL (ref 78.0–100.0)
Monocytes Absolute: 0.5 10*3/uL (ref 0.1–1.0)
Monocytes Relative: 8.5 % (ref 3.0–12.0)
Neutro Abs: 4.5 10*3/uL (ref 1.4–7.7)
Neutrophils Relative %: 75.6 % (ref 43.0–77.0)
Platelets: 307 10*3/uL (ref 150.0–400.0)
RBC: 2.89 M/uL — ABNORMAL LOW (ref 4.22–5.81)
RDW: 15.2 % — ABNORMAL HIGH (ref 11.5–14.6)
Vitamin B-12: 720 pg/mL (ref 211–911)
WBC: 6 10*3/uL (ref 4.5–10.5)

## 2010-07-13 ENCOUNTER — Ambulatory Visit: Payer: Self-pay | Admitting: Oncology

## 2010-07-13 ENCOUNTER — Telehealth: Payer: Self-pay | Admitting: Internal Medicine

## 2010-07-20 ENCOUNTER — Telehealth: Payer: Self-pay | Admitting: Internal Medicine

## 2010-07-20 ENCOUNTER — Ambulatory Visit (HOSPITAL_COMMUNITY): Admission: RE | Admit: 2010-07-20 | Discharge: 2010-07-20 | Payer: Self-pay | Admitting: Internal Medicine

## 2010-07-20 ENCOUNTER — Ambulatory Visit: Payer: Self-pay | Admitting: Internal Medicine

## 2010-07-24 ENCOUNTER — Ambulatory Visit: Payer: Self-pay | Admitting: Internal Medicine

## 2010-07-24 LAB — CONVERTED CEMR LAB
Basophils Absolute: 0.1 10*3/uL (ref 0.0–0.1)
Basophils Relative: 1.4 % (ref 0.0–3.0)
Eosinophils Absolute: 0.1 10*3/uL (ref 0.0–0.7)
Eosinophils Relative: 1.8 % (ref 0.0–5.0)
HCT: 36.8 % — ABNORMAL LOW (ref 39.0–52.0)
Hemoglobin: 12.3 g/dL — ABNORMAL LOW (ref 13.0–17.0)
Lymphocytes Relative: 12.9 % (ref 12.0–46.0)
Lymphs Abs: 0.6 10*3/uL — ABNORMAL LOW (ref 0.7–4.0)
MCHC: 33.5 g/dL (ref 30.0–36.0)
MCV: 97.1 fL (ref 78.0–100.0)
Monocytes Absolute: 0.6 10*3/uL (ref 0.1–1.0)
Monocytes Relative: 12.2 % — ABNORMAL HIGH (ref 3.0–12.0)
Neutro Abs: 3.4 10*3/uL (ref 1.4–7.7)
Neutrophils Relative %: 71.7 % (ref 43.0–77.0)
Platelets: 250 10*3/uL (ref 150.0–400.0)
RBC: 3.79 M/uL — ABNORMAL LOW (ref 4.22–5.81)
RDW: 14.6 % (ref 11.5–14.6)
WBC: 4.8 10*3/uL (ref 4.5–10.5)

## 2010-08-15 ENCOUNTER — Encounter: Payer: Self-pay | Admitting: Internal Medicine

## 2010-09-05 ENCOUNTER — Telehealth: Payer: Self-pay | Admitting: Internal Medicine

## 2010-09-05 ENCOUNTER — Ambulatory Visit: Payer: Self-pay | Admitting: Internal Medicine

## 2010-09-05 LAB — CONVERTED CEMR LAB
Basophils Absolute: 0 10*3/uL (ref 0.0–0.1)
Basophils Relative: 0.7 % (ref 0.0–3.0)
Eosinophils Absolute: 0.1 10*3/uL (ref 0.0–0.7)
Eosinophils Relative: 1.8 % (ref 0.0–5.0)
Ferritin: 69.8 ng/mL (ref 22.0–322.0)
HCT: 42.8 % (ref 39.0–52.0)
Hemoglobin: 14.5 g/dL (ref 13.0–17.0)
Lymphocytes Relative: 14.5 % (ref 12.0–46.0)
Lymphs Abs: 0.9 10*3/uL (ref 0.7–4.0)
MCHC: 33.8 g/dL (ref 30.0–36.0)
MCV: 90.8 fL (ref 78.0–100.0)
Monocytes Absolute: 0.5 10*3/uL (ref 0.1–1.0)
Monocytes Relative: 7.9 % (ref 3.0–12.0)
Neutro Abs: 4.4 10*3/uL (ref 1.4–7.7)
Neutrophils Relative %: 75.1 % (ref 43.0–77.0)
Platelets: 209 10*3/uL (ref 150.0–400.0)
RBC: 4.71 M/uL (ref 4.22–5.81)
RDW: 14 % (ref 11.5–14.6)
WBC: 5.9 10*3/uL (ref 4.5–10.5)

## 2010-09-28 ENCOUNTER — Encounter: Payer: Self-pay | Admitting: Cardiology

## 2010-10-03 ENCOUNTER — Telehealth: Payer: Self-pay | Admitting: Cardiology

## 2010-10-09 ENCOUNTER — Telehealth: Payer: Self-pay | Admitting: Cardiology

## 2010-10-12 ENCOUNTER — Ambulatory Visit: Payer: Self-pay | Admitting: Cardiology

## 2010-10-12 DIAGNOSIS — I4510 Unspecified right bundle-branch block: Secondary | ICD-10-CM | POA: Insufficient documentation

## 2010-12-11 NOTE — Letter (Signed)
Summary: [name] [name] Instructions  [facility]  [place], [name] [address]   Phone: [phone]  Fax: [phone]       [name] [name]    04/29/1951    MRN: [id number]        Procedure Day /Date:  07/20/10  Friday     Arrival Time:  8:00am      Procedure Time:  9:00am     Location of Procedure:                     _x _  [facility] ( Outpatient Registration)                        [name]   Starting 5 days prior to your procedure _ 9/4/11_ do not eat nuts, seeds, popcorn, corn, beans, peas,  salads, or any raw vegetables.  Do not take any fiber supplements (e.g. Metamucil, Citrucel, and Benefiber).  THE DAY BEFORE YOUR PROCEDURE         DATE:   07/19/10   DAY:  Thursday  1.  Drink clear liquids the entire day-NO SOLID FOOD  2.  Do not drink anything colored red or purple.  Avoid juices with pulp.  No orange juice.  3.  Drink at least 64 oz. (8 glasses) of fluid/clear liquids during the day to prevent dehydration and help the prep work efficiently.  CLEAR LIQUIDS INCLUDE: Water Jello Ice Popsicles Tea (sugar ok, no milk/cream) Powdered fruit flavored drinks Coffee (sugar ok, no milk/cream) Gatorade Juice: apple, white grape, white cranberry  Lemonade Clear bullion, consomm, broth Carbonated beverages (any kind) Strained chicken noodle soup Hard Candy                             4.  In the morning, mix first dose of MoviPrep solution:    Empty 1 Pouch A and 1 Pouch B into the disposable container    Add lukewarm drinking water to the top line of the container. Mix to dissolve    Refrigerate (mixed solution should be used within 24 hrs)  5.  Begin drinking the prep at 5:00 p.m. The MoviPrep container is divided by 4 marks.   Every 15 minutes drink the solution down to the next mark (approximately 8 oz) until the full liter is complete.   6.  Follow completed prep with 16 oz of clear liquid of  your choice (Nothing red or purple).  Continue to drink clear liquids until bedtime.  7.  Before going to bed, mix second dose of MoviPrep solution:    Empty 1 Pouch A and 1 Pouch B into the disposable container    Add lukewarm drinking water to the top line of the container. Mix to dissolve    Refrigerate  THE DAY OF YOUR PROCEDURE      DATE:   07/20/10  DAY:  Friday  Beginning at 4:00 a.m. (5 hours before procedure):         1. Every 15 minutes, drink the solution down to the next mark (approx 8 oz) until the full liter is complete.  2. Follow completed prep with 16 oz. of clear liquid of your choice.    3. You may drink clear liquids until  5:00am  (4 HOURS BEFORE PROCEDURE).   MEDICATION INSTRUCTIONS  Unless otherwise instructed, you should take regular prescription medications  with a small sip of water   as early as possible the morning of your procedure.    Stop taking Plavix on  _  _  (7 days before procedure)(Dr.Gessner's office will contact you about plavix, call  us on 07-12-10 thursday if you have not heard from [place]!)      Additional medication instructions: n/a         OTHER INSTRUCTIONS  You will need a responsible adult at least 59 years of age to accompany you and drive you home.   This person must remain in the waiting room during your procedure.  Wear loose fitting clothing that is easily removed.  Leave jewelry and other valuables at home.  However, you may wish to bring a book to read or  an iPod/MP3 player to listen to music as you wait for your procedure to start.  Remove all body piercing jewelry and leave at home.  Total time from sign-in until discharge is approximately 2-3 hours.  You should go home directly after your procedure and rest.  You can resume normal activities the  day after your procedure.  The day of your procedure you should not:   Drive   Make legal decisions   Operate machinery   Drink alcohol   Return to  work  You will receive specific instructions about eating, activities and medications before you leave.    The above instructions have been reviewed and explained to me by   [name] Aland RN  July 06, 2010 4:21 PM     I fully understand and can verbalize these instructions _____________________________ Date _________

## 2010-12-11 NOTE — Procedures (Signed)
Summary: Small Bowel Endoscopy  Patient: [name] [name] Note: All result statuses are Final unless otherwise noted.  Tests: (1) Small Bowel Endoscopy (SBE)  SBE Small Bowel Endoscopy                             DONE (C)     [facility]     [address] [place], [place]  [address]           OPERATIVE PROCEDURE REPORT           PATIENT:  [name], [name]  MR#:  [id number]     BIRTHDATE:  Sep 07, 1951, 42 yrs. old  GENDER:  male           ENDOSCOPIST:  Gatha Mayer, MD, [facility]           PROCEDURE DATE:  07/20/2010     PROCEDURE:  Small Bowel Endoscopy with Ablation of Lesions     ASA CLASS:  Class III     INDICATIONS:  1) anemia  2) melenic bleeding           MEDICATIONS:   Fentanyl 100 mcg, Versed 9 mg     TOPICAL ANESTHETIC:  Cetacaine Spray           DESCRIPTION OF PROCEDURE:   After the risks benefits and     alternatives of the procedure were thoroughly explained, informed     consent was obtained.  The  endoscope was introduced through the     mouth and advanced to the proximal jejunum, without limitations.     The instrument was slowly withdrawn as the mucosa was fully     examined.     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 Two small     AVM's (57m maximum) seen. Argon plasma coagulation ablation     performed.  Otherwise normal exam.    Retroflexed views revealed     no abnormalities.    The scope was then withdrawn from the patient     and the procedure terminated.           COMPLICATIONS:  None           ENDOSCOPIC IMPRESSION:     1) Av malformations (2) in the proximal jejunum - ablated with     APC     2) ADDENDUM: Submucosal pharyngeal nodules - appear benign, will     have ENT evaluation arranged     2) Otherwise normal exam     RECOMMENDATIONS:     resume Plavix     see colonoscopy report also           REPEAT EXAM:  In for as needed.           CGatha Mayer MD, FMarval Regal          CC:  SHilliard Clark A. ELoanne Drilling M.D.     The Patient           n.     REVISED:  07/20/2010 11:30 AM     eSIGNED:   CGatha Mayerat 07/20/2010 11:30 AM           SEber Jones [id number] Note: An exclamation mark (!) indicates a result that was not dispersed into the flowsheet. Document Creation Date: 07/20/2010 11:31 AM _______________________________________________________________________  (1) Order result status:  Final Collection or observation date-time: 07/20/2010 10:30 Requested date-time:  Receipt date-time:  Reported date-time:  Referring Physician:   Ordering Physician: Silvano Rusk [phone]) Specimen Source:  Source: Tawanna Cooler Order Number: [id number] Lab site:

## 2010-12-11 NOTE — Procedures (Signed)
Summary: Capsule: AVM x 2   Capsule Endoscopy  Procedure date:  02/24/2006  Findings:      Abnormal:AVM x 2  Performing Location: [facility]   FINDINGS:  1.  This is a completed study in which the capsule reached the colon.  2.  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 They were not actively bleeding but certainly could be a cause for his blood loss.  There may be other AVMs as well not seen on this study.  3.  Otherwise normal examination.  NAME:  EUSTACE, HUR NO.:  [id number]   MEDICAL RECORD NO.:  [id number]          PATIENT TYPE:  AMB   LOCATION:  ENDO                         FACILITY:  [facility]   PHYSICIAN:  Gatha Mayer, M.D. LHCDATE OF BIRTH:  16-Oct-1951   DATE OF PROCEDURE:  02/24/2006  DATE OF DISCHARGE:  02/24/2006                                 OPERATIVE REPORT   PROCEDURE:  Small bowel capsule endoscopy.   INDICATIONS:  Anemia, hemoglobin of 5, heme-positive stools.  Prior  colonoscopy and EGD have been unrevealing.   PROCEDURE DATA:  1.  Height:  The patient was 68 inches tall.  2.  Weight:  224 pounds.  3.  Build:  Stocky.   Gastric passage time 35 minutes.  Small bowel passage time 3 hours, 44  minutes.  These are both normal.   FINDINGS:  1.  This is a completed study in which the capsule reached the colon.  2.  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 They were not      actively bleeding but certainly could be a cause for his blood loss.      There may be other AVMs as well not seen on this study.  3.  Otherwise normal examination.   RECOMMENDATIONS:  Push enteroscopy and ablation of the AVMs will be  considered.  This needs to be discussed with the patient.      Gatha Mayer, M.D. [facility]  Electronically Signed     CEG/MEDQ  D:  03/04/2006  T:  03/05/2006  Job:  [id number]

## 2010-12-11 NOTE — Assessment & Plan Note (Signed)
Summary: FREQ NIGHT SWEATS/SAE/$50/CD   Vital Signs:  Patient profile:   60 year old male Height:      68 inches Weight:      239.13 pounds BMI:     36.49 O2 Sat:      94 % Temp:     97.7 degrees F oral Pulse rate:   69 / minute BP sitting:   114 / 80  (left arm) Cuff size:   regular  Vitals Entered By: Hector Brunswick (June 08, 2009 1:39 PM) CC: cough and congestion for 2 days   Primary Care Provider:  Loanne Drilling  CC:  cough and congestion for 2 days.  History of Present Illness: here with acute onset fever, prod cough and chest congestion for 2 days, with marked sweat at night, as some day as well; no CP, wheezing, sob, doe, orthopnea, pnd or LE edema , or ST; good compliacne with meds and denies polys or low sugars, cbg;s in the low 100's, trying to follow diet, wt overall stable   Problems Prior to Update: 1)  Bronchitis-acute  (ICD-466.0) 2)  Acute Sinusitis, Unspecified  (ICD-461.9) 3)  Cellulitis and Abscess of Upper Arm and Forearm  (ICD-682.3) 4)  Diverticulosis, Colon  (ICD-562.10) 5)  Anemia-iron Deficiency  (ICD-280.9) 6)  Hematuria Unspecified  (ICD-599.70) 7)  Back Pain, Lumbar  (ICD-724.2) 8)  Unspecified Disorder of Urethra&urinary Tract  (ICD-599.9) 9)  Sweating  (ICD-780.8) 10)  Routine General Medical Exam@health  Care Facl  (ICD-V70.0) 11)  Dm  (ICD-250.00) 12)  Screening Examination Unspec Infectious Disease  (ICD-V75.9) 13)  Cough  (ICD-786.2) 14)  Uri  (ICD-465.9) 15)  Hypertension  (ICD-401.9) 16)  Hyperlipidemia  (ICD-272.4) 17)  Gerd  (ICD-530.81) 18)  Coronary Artery Disease  (ICD-414.00)  Medications Prior to Update: 1)  Atenolol 25 Mg  Tabs (Atenolol) .... Take 1 By Mouth Qd 2)  Plavix 75 Mg  Tabs (Clopidogrel Bisulfate) .... Take 1 By Mouth Qd 3)  Lisinopril-Hydrochlorothiazide 20-12.5 Mg  Tabs (Lisinopril-Hydrochlorothiazide) .... Take 1 By Mouth Qd 4)  Aspirin 81 Mg  Tbec (Aspirin) .... Take 1 By Mouth Qg 5)  Ferrous Sulfate 324 Mg  Tabs  (Ferrous Sulfate) .... Take 1 By Mouth Qd 6)  Simvastatin 80 Mg Tabs (Simvastatin) .... Take 1 By Mouth Qd 7)  Metformin Hcl 500 Mg  Tabs (Metformin Hcl) .... Take 4 By Mouth Qd 8)  Nexium 40 Mg  Cpdr (Esomeprazole Magnesium) .... Qd  Current Medications (verified): 1)  Atenolol 25 Mg  Tabs (Atenolol) .... Take 1 By Mouth Qd 2)  Plavix 75 Mg  Tabs (Clopidogrel Bisulfate) .... Take 1 By Mouth Qd 3)  Lisinopril-Hydrochlorothiazide 20-12.5 Mg  Tabs (Lisinopril-Hydrochlorothiazide) .... Take 1 By Mouth Qd 4)  Aspirin 81 Mg  Tbec (Aspirin) .... Take 1 By Mouth Qg 5)  Ferrous Sulfate 324 Mg  Tabs (Ferrous Sulfate) .... Take 1 By Mouth Qd 6)  Simvastatin 80 Mg Tabs (Simvastatin) .... Take 1 By Mouth Qd 7)  Metformin Hcl 500 Mg  Tabs (Metformin Hcl) .... Take 4 By Mouth Qd 8)  Nexium 40 Mg  Cpdr (Esomeprazole Magnesium) .... Qd 9)  Ceftin 250 Mg Tabs (Cefuroxime Axetil) .Marland Kitchen.. 1 By Mouth Two Times A Day 10)  Tessalon Perles 100 Mg Caps (Benzonatate) .Marland Kitchen.. 1 - 2 By Mouth Three Times A Day As Needed  Allergies (verified): 1)  ! Iodine 2)  ! * Shellfish  Past History:  Past Medical History: Last updated: 11/18/2008 Smoker Chronic sinusitis DM (ICD-250.00)  SCREENING EXAMINATION UNSPEC INFECTIOUS DISEASE (ICD-V75.9) COUGH (ICD-786.2) URI (ICD-465.9) HYPERTENSION (ICD-401.9) HYPERLIPIDEMIA (ICD-272.4) GERD (ICD-530.81) CORONARY ARTERY DISEASE (ICD-414.00) Hyperlipidemia Coronary artery disease Anemia-iron deficiency DJD Diverticulosis, colon  Past Surgical History: Last updated: 06/02/2007 PTCA/stent EDG (11/07/2005) Stress Cardiolite (11/30/2004) EKG (03/30/2007)  Social History: Last updated: 11/18/2008 single works [employer] Alcohol use-yes - rare Current Smoker no children  Risk Factors: Smoking Status: current (11/18/2008)  Review of Systems       all otherwise negative   Physical Exam  General:  alert and overweight-appearing.  , mild ill appearing,  nontoxic Head:  normocephalic and atraumatic.   Eyes:  vision grossly intact, pupils equal, and pupils round.   Ears:  bilat tm's mild erythema, sinus nontender Nose:  nasal dischargemucosal pallor and mucosal erythema.   Mouth:  pharyngeal erythema and fair dentition.   Neck:  supple and no masses.   Lungs:  normal respiratory effort and normal breath sounds.   Heart:  normal rate and regular rhythm.   Extremities:  no edema, no ulcers    Impression & Recommendations:  Problem # 1:  BRONCHITIS-ACUTE (ICD-466.0)  His updated medication list for this problem includes:    Ceftin 250 Mg Tabs (Cefuroxime axetil) .Marland Kitchen... 1 by mouth two times a day    Tessalon Perles 100 Mg Caps (Benzonatate) .Marland Kitchen... 1 - 2 by mouth three times a day as needed treat as above, f/u any worsening signs or symptoms   Problem # 2:  HYPERTENSION (ICD-401.9)  His updated medication list for this problem includes:    Atenolol 25 Mg Tabs (Atenolol) .Marland Kitchen... Take 1 by mouth qd    Lisinopril-hydrochlorothiazide 20-12.5 Mg Tabs (Lisinopril-hydrochlorothiazide) .Marland Kitchen... Take 1 by mouth qd  BP today: 114/80 Prior BP: 102/62 (02/18/2009)  Labs Reviewed: K+: 4.2 (06/06/2008) Creat: : 0.8 (06/06/2008)   Chol: 135 (06/06/2008)   HDL: 42.2 (06/06/2008)   LDL: 63 (06/06/2008)   TG: 149 (06/06/2008) stable overall by hx and exam, ok to continue meds/tx as is   Problem # 3:  DM (ICD-250.00)  His updated medication list for this problem includes:    Lisinopril-hydrochlorothiazide 20-12.5 Mg Tabs (Lisinopril-hydrochlorothiazide) .Marland Kitchen... Take 1 by mouth qd    Aspirin 81 Mg Tbec (Aspirin) .Marland Kitchen... Take 1 by mouth qg    Metformin Hcl 500 Mg Tabs (Metformin hcl) .Marland Kitchen... Take 4 by mouth qd  Labs Reviewed: Creat: 0.8 (06/06/2008)    Reviewed HgBA1c results: 5.6 (08/29/2008)  5.7 (06/13/2008) stable overall by hx and exam, ok to continue meds/tx as is   Complete Medication List: 1)  Atenolol 25 Mg Tabs (Atenolol) .... Take 1 by mouth qd  2)  Plavix 75 Mg Tabs (Clopidogrel bisulfate) .... Take 1 by mouth qd 3)  Lisinopril-hydrochlorothiazide 20-12.5 Mg Tabs (Lisinopril-hydrochlorothiazide) .... Take 1 by mouth qd 4)  Aspirin 81 Mg Tbec (Aspirin) .... Take 1 by mouth qg 5)  Ferrous Sulfate 324 Mg Tabs (Ferrous sulfate) .... Take 1 by mouth qd 6)  Simvastatin 80 Mg Tabs (Simvastatin) .... Take 1 by mouth qd 7)  Metformin Hcl 500 Mg Tabs (Metformin hcl) .... Take 4 by mouth qd 8)  Nexium 40 Mg Cpdr (Esomeprazole magnesium) .... Qd 9)  Ceftin 250 Mg Tabs (Cefuroxime axetil) .Marland Kitchen.. 1 by mouth two times a day 10)  Tessalon Perles 100 Mg Caps (Benzonatate) .Marland Kitchen.. 1 - 2 by mouth three times a day as needed  Patient Instructions: 1)  Please take all new medications as prescribed 2)  Continue all medications that  you may have been taking previously  3)  Please schedule an appointment with your primary doctor as planned Prescriptions: TESSALON PERLES 100 MG CAPS (BENZONATATE) 1 - 2 by mouth three times a day as needed  #60 x 0   Entered and Authorized by:   Biagio Borg MD   Signed by:   Biagio Borg MD on 06/08/2009   Method used:   Electronically to        [facility] [phone]* (retail)       [address], [place]  [address]       Ph: [phone] or [phone]       Fax: [phone]   RxID:   [id number] CEFTIN 250 MG TABS (CEFUROXIME AXETIL) 1 by mouth two times a day  #20 x 0   Entered and Authorized by:   Biagio Borg MD   Signed by:   Biagio Borg MD on 06/08/2009   Method used:   Electronically to        [facility]. [phone]* (retail)       [address]       [place], [place]  [address]       Ph: [phone] or [phone]       Fax: [phone]   RxID:   [id number]

## 2010-12-11 NOTE — Assessment & Plan Note (Signed)
Summary: PHYSICAL---$50---STC   Vital Signs:  Patient Profile:   60 Years Old Male Height:     68 inches Weight:      240.0 pounds Temp:     98.5 degrees F oral Pulse rate:   78 / minute BP sitting:   136 / 89  (right arm) Cuff size:   regular  Pt. in pain?   no  Vitals Entered By: Tomma Lightning (June 13, 2008 3:57 PM)                  PCP:  Loanne Drilling  Chief Complaint:  CPX.  History of Present Illness: feels well.  rarely consumes etoh    Current Allergies: ! IODINE  Past Medical History:    Reviewed history from 06/02/2007 and no changes required:       Smoker       Chronic sinusitis       DM (ICD-250.00)       SCREENING EXAMINATION UNSPEC INFECTIOUS DISEASE (ICD-V75.9)       COUGH (ICD-786.2)       URI (ICD-465.9)       HYPERTENSION (ICD-401.9)       HYPERLIPIDEMIA (ICD-272.4)       GERD (ICD-530.81)       CORONARY ARTERY DISEASE (ICD-414.00)   Family History:    Reviewed history and no changes required:       sister had breast       mother had colon cancer at advanced age  Social History:    Reviewed history and no changes required:       single       works [employer]    Review of Systems       denies weight loss, blurry vision, headache, chest pain, sob, urinary frequency, cramps, excessive diaphoresis, memory loss, depression, hypoglycemia, has some easy bruising, which he attributes to plavix     Physical Exam  General:     obese.   Head:     head is normocephalic eyes: no scleral icterus no periorbital swelling perrl external ears are normal nose normal externally mouth has no lesion, including normal tongue  Neck:     no masses, thyromegaly, or abnormal cervical nodes Lungs:     clear to auscultation.  no respiratory distress  Heart:     regular rate and rhythm, S1, S2 without murmurs, rubs, gallops, or clicks Abdomen:     abdomen is soft, nontender.  no hepatosplenomegaly.   not distended.  no hernia  Rectal:      normal internal exam Prostate:     normal Msk:     no deformity or scoliosis noted with normal posture and gait Pulses:     dorsalis pedis intact bilat.  no carotid bruit  Extremities:     no deformity.  no ulcer on the feet.  feet are of normal color and temp.  no edema  Neurologic:     cn 2-12 grossly intact.   readily moves all 4's.    Skin:     normal texture and temp.  no rash.  not diaphoretic  Cervical Nodes:     no significant adenopathy Inguinal Nodes:     no significant adenopathy Psych:     alert and cooperative; normal mood and affect; normal attention span and concentration Additional Exam:     A1C%  5.7 %      Impression & Recommendations:  Problem # 1:  ROUTINE GENERAL MEDICAL EXAM@HEALTH  CARE FACL (ICD-V70.0)  Orders: Est. Patient 40-64 years ([address])   Other Orders: TLB-A1C / Hgb A1C (Glycohemoglobin) (83036-A1C)   Patient Instructions: 1)  we discussed the recommendations of the preventive services task force 2)  reduce fe to 325/d. 3)  in 3 mos come in for a1c 250.00  iron panel and cbc 280.9   ]  Appended Document: PHYSICAL---$50---STC Labs already entered/LMB

## 2010-12-11 NOTE — Assessment & Plan Note (Signed)
Summary: POST ER VISIT/ANEMIA AND RECTAL BLEEDING   (JeffreyGESSNER PT)   ...    History of Present Illness Visit Type: Initial Visit Primary GI MD: Silvano Rusk MD [facility] Primary Provider: [name], MD Chief Complaint: post ER Visit for anemia, rectal bleeding with abdominal tenderness/SOB History of Present Illness:   [name] [name] is a 60 year old male with multiple medical problems referred here for evaluation of anemia. Patient known to Dr Carlean [name] for [facility], diverticulosis, chronic anemia believed to be secondary to AVMs (jejunum) seen on capsule endoscopy in April 2007. Patient has been on Plavix for years. Stools always black on iron. No history of rectal bleeding until last Wednesday night when patient developed acute nausea, vomiting (non-bloody), mid abdominal pain and rectal bleeding.  He recovered from that but went to see PCP on 06/15/10 for SOB. Labs revealed hgb of 7.3 and patient advised to go to ER. There he was found to be heme positive with hgb. of 7.6. MCV of 100.9. WBC was normal. Normal renal function studies.    GI Review of Systems    Reports nausea.      Denies abdominal pain, acid reflux, belching, bloating, chest pain, dysphagia with liquids, dysphagia with solids, heartburn, loss of appetite, vomiting, vomiting blood, and  weight loss.        Denies anal fissure, black tarry stools, change in bowel habit, constipation, diarrhea, diverticulosis, fecal incontinence, heme positive stool, hemorrhoids, irritable bowel syndrome, jaundice, light color stool, liver problems, rectal bleeding, and  rectal pain.    Current Medications (verified): 1)  Atenolol 25 Mg  Tabs (Atenolol) .... Take 1 By Mouth Qd 2)  Plavix 75 Mg  Tabs (Clopidogrel Bisulfate) .... Take 1 By Mouth Qd 3)  Aspirin 81 Mg  Tbec (Aspirin) .... Take 1 By Mouth Qg 4)  Ferrous Sulfate 325 (65 Fe) Mg Tabs (Ferrous Sulfate) .... 3 Tablets By Mouth Once Daily 5)  Simvastatin 80 Mg Tabs (Simvastatin) .... Take 1  By Mouth Qd 6)  Nexium 40 Mg  Cpdr (Esomeprazole Magnesium) .... As Needed 7)  Lisinopril-Hydrochlorothiazide 10-12.5 Mg Tabs (Lisinopril-Hydrochlorothiazide) .Marland Kitchen.. 1 Once Daily 8)  Cefuroxime Axetil 250 Mg Tabs (Cefuroxime Axetil) .Marland Kitchen.. 1 Tab Two Times A Day 9)  Promethazine-Dm 6.25-15 Mg/55m Syrp (Promethazine-Dm) ..Marland Kitchen. 1 Teaspoon Every Every 4 Hrs As Needed For Cough  Allergies (verified): 1)  ! Iodine 2)  ! * Shellfish  Past History:  Past Medical History: CORONARY ARTERY DISEASE (ICD-414.00) POSTURAL LIGHTHEADEDNESS (ICD-780.4) HYPERTENSION (ICD-401.9) HYPERLIPIDEMIA (ICD-272.4) GERD (ICD-530.81) DM (ICD-250.00) TOBACCO USER (ICD-305.1) FEVER UNSPECIFIED (ICD-780.60) ACUTE SINUSITIS, UNSPECIFIED (ICD-461.9) CELLULITIS AND ABSCESS OF UPPER ARM AND FOREARM (ICD-682.3) DIVERTICULOSIS, COLON (ICD-562.10) ANEMIA-IRON DEFICIENCY (ICD-280.9) HEMATURIA UNSPECIFIED (ICD-599.70) BACK PAIN, LUMBAR (ICD-724.2) UNSPECIFIED DISORDER OF URETHRA&URINARY TRACT (ICD-599.9) SWEATING (ICD-780.8) ROUTINE GENERAL MEDICAL EXAM@HEALTH  CARE FACL (ICD-V70.0) SCREENING EXAMINATION UNSPEC INFECTIOUS DISEASE (ICD-V75.9) COUGH (ICD-786.2) URI (ICD-465.9) SMALL BOWEL AVMS  Past Surgical History: Reviewed history from 06/02/2007 and no changes required. PTCA/stent EDG (11/07/2005) Stress Cardiolite (11/30/2004) EKG (03/30/2007)  Family History: Reviewed history from 07/11/2009 and no changes required. sister had breast cancer mother had colon cancer at advanced age  Social History: Reviewed history from 07/11/2009 and no changes required. single works [employer] Alcohol use-yes - rare Current Smoker no children Daily Caffeine Use  Review of Systems       The patient complains of anemia, fatigue, fever, night sweats, and shortness of breath.  The patient denies allergy/sinus, anxiety-new, arthritis/joint pain, back pain, blood  in urine, breast changes/lumps, change in vision,  confusion, cough, coughing up blood, depression-new, fainting, headaches-new, hearing problems, heart murmur, heart rhythm changes, itching, menstrual pain, muscle pains/cramps, nosebleeds, pregnancy symptoms, skin rash, sleeping problems, sore throat, swelling of feet/legs, swollen lymph glands, thirst - excessive , urination - excessive , urination changes/pain, urine leakage, vision changes, and voice change.    Vital Signs:  Patient profile:   60 year old male Height:      68 inches Weight:      230 pounds BMI:     35.10 Pulse rate:   80 / minute Pulse rhythm:   regular BP sitting:   112 / 68  (left arm)  Vitals Entered By: Randye Lobo NCMA (June 18, 2010 3:19 PM)  Physical Exam  General:  Well developed, well nourished, no acute distress. Head:  Normocephalic and atraumatic. Eyes:  Conjunctiva pink, no icterus.  Neck:  no obvious masses  Lungs:  Clear throughout to auscultation. Heart:  Regular rate and rhythm; no murmurs, rubs,  or bruits. Abdomen:  Obese, soft, Non-tender, non-distended. No obvious masses.  Rectal:  No fissures. Small internal hemorrhoids on anoscopic view.  Msk:  Symmetrical with no gross deformities. Normal posture. Extremities:  No palmar erythema, no edema. ] Neurologic:  Alert and  oriented x4;  grossly normal neurologically. Skin:  Intact without significant lesions or rashes. Cervical Nodes:  No significant cervical adenopathy. Psych:  Alert and cooperative. Normal mood and affect.   Impression & Recommendations:  Problem # 1:  ANEMIA-UNSPECIFIED (ZOX-096.9) Assessment Deteriorated Chronic anemia, macrocytic at present. Profound anemia with hemoglobin of 5.0 on Plavix in April 2007. Enteroscopy and colonoscopy unrevealing. Capsule study showed two non-bleeding AVMs in first hour of study. Stlll on Plavix. Now with falling hgb which was 8.6 in March 2011. Iron started three times a day at that time. Hgb up to 10. 16 February 2010. Now down to 7.3  (still on iron). TIBC 288, 13.1 %sat.  Stool black on iron but heme positive. He complains of mild SOB but overall looks fine. Will arrange for transfusion of 2 units PRBC. Continue iron. Will talk with patient's primary gastroenterologist [name] [name] to see if he wants to pursue another enteroscopy. Will let patient know something in next few days.  Orders: Blood Transfusion (Bld Trans)  Problem # 2:  HEMORRHOIDS-INTERNAL (ICD-455.0) Assessment: Comment Only Recent episode of nausea, vomiting, abdominal pain, rectal bleeding. Symptoms have resolved. May have been viral. He has small internal hemorrhoids on exam and they may have bled.   Problem # 3:  CORONARY ARTERY DISEASE (ICD-414.00) Assessment: Comment Only On chronic Plavix  Patient Instructions: 1)  Go to [facility] Short Stay Department on Tues 8-9- at 1:00 PM. 2)  We made the appt for the Transfusion for Wed 8-10 at 8:30 AM.  3)  Copy sent to : [name] Shin, MD 4)  The medication list was reviewed and reconciled.  All changed / newly prescribed medications were explained.  A complete medication list was provided to the patient / caregiver.  Appended Document: POST ER VISIT/ANEMIA AND RECTAL BLEEDING   (JeffreyGESSNER PT)   ... I need to see his paper chart his prior endoscopic evaluation needs to be copy/pasted (preferred) or pasted into the EMR records his anemia is macrocytic and he has not been iron deficient so probably needs hematology evaluation not clear to me he needs another endoscopic eval (possibly) has he had B12/Folate? - did not see but will recheck  Appended Document: POST ER VISIT/ANEMIA AND RECTAL BLEEDING   (JeffreyGESSNER PT)   ... B12 was ok min March if he has not seen hematology he should get an appt there I will review all endoscopic records and the past notes and decide about further endo eval please get that chart in my office so I can see it before Mon (I am working this weekend)  Appended Document: POST ER  VISIT/ANEMIA AND RECTAL BLEEDING   ([name] [name] PT)   ... Ordered chart for Dr Carlean [name]  Appended Document: POST ER VISIT/ANEMIA AND RECTAL BLEEDING   (JeffreyGESSNER PT)   ... [name] [name], can you please paste endoscopy records in EMR, ask patient to come for B12 and Folate, and refer to hematology for anemia (if he hasn't ever seen one and I don't think he has) Thanks

## 2010-12-11 NOTE — Progress Notes (Signed)
Summary: re appt- appt made on 12/22    Phone Note Call from Patient Call back at Home Phone [phone]   Caller: Patient Reason for Call: Talk to Nurse Summary of Call: pt states he can not make his appt on 11/30. pt would like to be seen the following week. pt does not want to wait to long for his appt. pt states we cancel his first appt because doctor was not able to see him that day.  Initial call taken by: Regan Lemming,  October 03, 2010 2:46 PM  Follow-up for Phone Call        returned call [phone] Lorenda Hatchet  October 03, 2010 3:28 PM  Pt aware of appt on 12/22 with Dr. Romie Jumper  October 09, 2010 1:02 PM   Additional Follow-up for Phone Call Additional follow up Details #1::        I talked with patient.  Thank you! Horton Chin RN

## 2010-12-11 NOTE — Assessment & Plan Note (Signed)
Summary: F6B    Allergies: 1)  ! Iodine 2)  ! * Shellfish

## 2010-12-11 NOTE — Progress Notes (Signed)
Summary: triage   Phone Note Call from Patient Call back at Home Phone [phone]   Caller: Patient Call For: Dr. [name] Reason for Call: Talk to Nurse Summary of Call: ER advised pt to make an appt with GI today... low hemoglobin and blood in stool Initial call taken by: Lucien Mons,  June 18, 2010 9:17 AM  Follow-up for Phone Call        Pt. will see Tye Savoy NP today at 3pm. He will bring med.list/insurance card/co-pay. Follow-up by: Vivia Ewing LPN,  June 18, 9037 9:55 AM

## 2010-12-11 NOTE — Procedures (Signed)
Summary: Colonoscopy: Diverticulosis   Colonoscopy  Procedure date:  11/07/2005  Findings:      Results: Diverticulosis. Location:  [place].   Comments: NO POLYPS OR CANCER SEEN  Comments:      Repeat colonoscopy in 5 years.   Procedures Next Due Date:    Colonoscopy: 11/2010   Patient Name: [name] [name], [name] [name] MRN:  Procedure Procedures: Colonoscopy CPT: 86381.  Personnel: Endoscopist: Gatha Mayer, MD, [facility].  Referred By: Jacelyn Pi Loanne Drilling, MD.  Exam Location: Exam performed in Outpatient Clinic. Outpatient  Patient Consent: Procedure, Alternatives, Risks and Benefits discussed, consent obtained, from patient. Consent was obtained by the RN.  Indications  Increased Risk Screening: For family history of colorectal neoplasia, in  parent age at onset: 21's.  History  Current Medications: Patient is taking an non-steroidal medication. Patient is not currently taking Coumadin. Antiplatelet: Plavix (Clopidrogel) 75 mg, QD,  Allergies: Allergic to iodine.  Comments: Drug-eluting stent in LAD so procedure performed on Plavix and ASA. Pre-Exam Physical: Performed Nov 07, 2005. Cardio-pulmonary exam, Rectal exam, HEENT exam , Abdominal exam, Mental status exam WNL.  Comments: Pt. history reviewed/updated, physical exam performed prior to initiation of sedation? YES  Exam Exam: Extent of exam reached: Cecum, extent intended: Cecum.  The cecum was identified by appendiceal orifice and IC valve. Patient position: on left side. Colon retroflexion performed. Images taken. ASA Classification: III. Tolerance: excellent.  Monitoring: Pulse and BP monitoring, Oximetry used. Supplemental O2 given.  Colon Prep Used MiraLax for colon prep. Prep results: good.  Sedation Meds: Patient assessed and found to be appropriate for moderate (conscious) sedation. Fentanyl 50 mcg. given IV. Versed 6 mg. given IV.  Findings - DIVERTICULOSIS: Descending Colon to  Sigmoid Colon. ICD9: Diverticulosis, Colon: 562.10.  - NORMAL EXAM: Cecum to Descending Colon.  NORMAL EXAM: Rectum.   Assessment  Diagnoses: 562.10: Diverticulosis, Colon.   Comments: NO POLYPS OR CANCER SEEN Events  Unplanned Interventions: No intervention was required.  Plans Patient Education: Patient given standard instructions for: Diverticulosis.  Disposition: After procedure patient sent to recovery. After recovery patient sent home.  Scheduling/Referral: Colonoscopy, to Gatha Mayer, MD, [facility], 5 yrs due to family history,  EGD, to Gatha Mayer, MD, [facility] next,   CC:   [name], MD  This report was created from the original endoscopy report, which was reviewed and signed by the above listed endoscopist.

## 2010-12-11 NOTE — Letter (Signed)
Summary: [place]   Imported By: Phillis Knack 02/21/2010 08:45:41  _____________________________________________________________________  External Attachment:    Type:   Image     Comment:   External Document

## 2010-12-11 NOTE — Progress Notes (Signed)
Summary: Says pt is not scheuled todya   Phone Note Call from Patient   Call For: Tye Savoy, NP Reason for Call: Talk to Nurse Summary of Call: Pt is at short stay and they are saying he is NOT schedule and they need for [place] to call [phone] and speak to nurse on Section B Initial call taken by: Irwin Brakeman [facility],  June 19, 2010 1:21 PM  Follow-up for Phone Call        I called and checked with the nurse , section B, at [facility] for Short Stay and the pt did get his Type and Cross today.  He is scheduled for tom 06-20-10 at 8:30 AM at [facility] short stay for the transfusion.  [facility] did recieve my faxed order from yesterday. Follow-up by: Sharol Roussel,  June 19, 2010 2:46 PM

## 2010-12-11 NOTE — Progress Notes (Signed)
Summary: hematuria  Phone Note Call from Patient   Summary of Call: Pt can not get an apt for urology untill November 4th. Blood in urine is lighter in am and become darker as day progresses.  Initial call taken by: Charlsie Quest,  September 07, 2008 12:30 PM  Follow-up for Phone Call        please offer pt appt with me next 2 days if pt wishes Follow-up by: Donavan Foil MD,  September 07, 2008 4:00 PM  Additional Follow-up for Phone Call Additional follow up Details #1::        Pt coming today for appt 10/29 @3 :15pm Additional Follow-up by: Denice Paradise,  September 08, 2008 8:22 AM

## 2010-12-11 NOTE — Letter (Signed)
Summary: Out of Work  [facility]  [address] [place], [place] [address]   Phone: [phone]  Fax: [phone]    June 21, 2010   Employee:    [name] [name] DOB:    [birth year]    To Whom It May Concern:   For medical reasons, please excuse the above named employee from work through Saturday, June 23, 2010.  He may return to work on Sunday, June 24, 2010.  If you need additional information, please feel free to contact our office.        Sincerely,    Abelino Derrick CMA ([place])

## 2010-12-11 NOTE — Letter (Signed)
Summary: Anticoagulation Modification Letter  [facility]  [place], [place] [address]   Phone: [phone]  Fax: [phone]    July 05, 2010  Re:    [name] [name] DOB:    05/15/1951 MRN:    [id number]    Dear Dr.Wall:  We have scheduled the above patient for an endoscopic procedure. Our records show that  he is on anticoagulation therapy. Please advise as to how long the patient may come off their therapy of Plavix, prior to the scheduled procedure(s) on 07-20-10.   Please fax back/or route the completed form to: [phone]. Vivia Ewing LPN  Thank you for your help with this matter.  Sincerely,  Vivia Ewing LPN   GI Physician Recommendation:  Hold Plavix 7 days prior ________________  Other ______________________________     Appended Document: Anticoagulation Modification Letter ok to hold plavix for 5 days prior to procedure, restart once procedure over.  Appended Document: Anticoagulation Modification Letter Message left for pt. with above MD instructions. Pt. instructed to call back as needed.

## 2010-12-11 NOTE — Letter (Signed)
Summary: Generic Letter  Press photographer at [place]. [address], [place] [address]   Phone: [phone]  Fax: [phone]        October 12, 2010 MRN: [id number]    [facility] [place], [place]  [address]    To Whom This may Concern,       [name] may hold Plavix dose for 5 days prior to dental   procedures and resume the night of procedure.          Sincerely,      This letter has been electronically signed by your physician.

## 2010-12-11 NOTE — Procedures (Signed)
Summary: Colonoscopy  Patient: [name] [name] Note: All result statuses are Final unless otherwise noted.  Tests: (1) Colonoscopy (COL)   COL Colonoscopy           [facility]     [place], [place]  [address]           COLONOSCOPY PROCEDURE REPORT           PATIENT:  [name] [name], [name] [name]  MR#:  [id number]     BIRTHDATE:  December 26, 1950, 59 yrs. old  GENDER:  male     ENDOSCOPIST:  Gatha Mayer, MD, [facility]           PROCEDURE DATE:  07/20/2010     PROCEDURE:  Colonoscopy [phone]     ASA CLASS:  Class III     INDICATIONS:  melena, Anemia, family history of colon cancer     Having melena and anemia. Has been on chronic iron. Takes Plavix.           MEDICATIONS:   There was residual sedation effect present from     prior procedure., Versed 1 mg           DESCRIPTION OF PROCEDURE:   After the risks benefits and     alternatives of the procedure were thoroughly explained, informed     consent was obtained.  Digital rectal exam was performed and     revealed no abnormalities and normal prostate.   The  endoscope     was introduced through the anus and advanced to the terminal ileum     which was intubated for a short distance, without limitations.     The quality of the prep was excellent, using MoviPrep.  The     instrument was then slowly withdrawn as the colon was fully     examined. Insertion: 3:30 minutes Withdrawal: 7 minutes     <<PROCEDUREIMAGES>>           FINDINGS:  Moderate diverticulosis was found.  This was otherwise     a normal examination of the colon.   Retroflexed views in the     rectum revealed no abnormalities.    The scope was then withdrawn     from the patient and the procedure completed.           COMPLICATIONS:  None     ENDOSCOPIC IMPRESSION:     1) Moderate diverticulosis     2) Otherwise normal examination     RECOMMENDATIONS:     CBC today.     Continue iron.     See hematology as planned as I am not convinced that all  of     (macrocytic) anemia is due to blood loss.     REPEAT EXAM:  In 5 year(s) for routine screening colonoscopy.     Family hx colon cancer           Gatha Mayer, MD, [facility]           CC:  Donavan Foil, MD     The Patient           n.     Lorrin Mais:   Gatha Mayer at 07/20/2010 11:07 AM           Eber Jones, [id number]  Note: An exclamation mark (!) indicates a result that was not dispersed into the flowsheet. Document Creation Date: 07/20/2010 11:08 AM _______________________________________________________________________  (1)  Order result status: Final Collection or observation date-time: 07/20/2010 10:52 Requested date-time:  Receipt date-time:  Reported date-time:  Referring Physician:   Ordering Physician: Silvano Rusk [phone]) Specimen Source:  Source: [name] [name] Order Number: [id number] Lab site:   Appended Document: Colonoscopy Recall is in [place] for 07/2015.  Appended Document: Colonoscopy CBC was not obtained at hospital will arrange for him to have next week at [place]: Colonoscopy Pt. will have lab redone here at [facility] this week. Pt. instructed to call back as needed.

## 2010-12-11 NOTE — Assessment & Plan Note (Signed)
Summary: COUGH AND CONGESTION FOR A WEEK/ SOB/ $50/NWS   Vital Signs:  Patient Profile:   60 Years Old Male Height:     68 inches Weight:      241.4 pounds Temp:     97.7 degrees F oral Pulse rate:   69 / minute BP sitting:   120 / 77  (right arm) Cuff size:   regular  Pt. in pain?   no  Vitals Entered By: Tomma Lightning (May 19, 2008 9:26 AM)                  PCP:  Loanne Drilling  Chief Complaint:  cough/ chest congestion/ not able to cough up phlegm.  History of Present Illness: 15d slightly prod cough, slight sore throat, and doe.  denies earache.    Current Allergies: ! IODINE  Past Medical History:    Reviewed history from 06/02/2007 and no changes required:       Coronary artery disease       GERD       Hyperlipidemia       Hypertension       Smoker       Chronic sinusitis       Hyperglycemia     Review of Systems  The patient denies fever.     Physical Exam  General:     well developed, well nourished, in no acute distress Head:     head is normocephalic eyes: no scleral icterus no periorbital swelling perrl external ears are normal nose normal externally mouth has no lesion, including normal tongue  Ears:     TMs intact and clear with normal canals and hearing Lungs:     clear to auscultation.  no respiratory distress  Additional Exam:     cxr: nad    Impression & Recommendations:  Problem # 1:  COUGH (ICD-786.2) due to uri  Medications Added to Medication List This Visit: 1)  Vibramycin 100 Mg Caps (Doxycycline hyclate) .... Bid 2)  Nexium 40 Mg Cpdr (Esomeprazole magnesium) .... Qd 3)  Tessalon Perles 100 Mg Caps (Benzonatate) .... Three times a day as needed cough   Patient Instructions: 1)  doxyxycline 100 two times a day 2)  tessalon 100 three times a day prn   Prescriptions: TESSALON PERLES 100 MG  CAPS (BENZONATATE) three times a day as needed cough  #30 x 0   Entered and Authorized by:   Donavan Foil MD   Signed by:    Donavan Foil MD on 05/19/2008   Method used:   Electronically sent to ...       [facility] [phone]*       [address] [place], [place]  [address]       Ph: [phone] or [phone]       Fax: [phone]   RxID:   [id number] VIBRAMYCIN 100 MG  CAPS (DOXYCYCLINE HYCLATE) bid  #14 x 0   Entered and Authorized by:   Donavan Foil MD   Signed by:   Donavan Foil MD on 05/19/2008   Method used:   Electronically sent to ...       [facility] [phone]*       [address]       [place], [place]  [address]       Ph: [phone] or [phone]       Fax: [phone]  RxID:   [id number]  ]

## 2010-12-11 NOTE — Progress Notes (Signed)
Summary: referral?   Phone Note Call from Patient Call back at Home Phone [phone]   Caller: Patient Call For: Dr. [name] Reason for Call: Talk to Nurse Summary of Call: pt says he has been informed by the cancer center that he has a referral to a blood specialist by Dr. [name]... pt says he was unaware of this Initial call taken by: Lucien Mons,  July 13, 2010 2:17 PM  Follow-up for Phone Call        pt received phone call from [place] to schedule appt, but pt is unsure why he is seeing hematologist.  Advised pt appt is to eval chronic anemia.  Pt is agreeable and states appt is scheduled for 9/21. Reviewed appt time of colon with patient.  Pt want to know if colonoscopy has been approved by [employer].  Advised pt I can't see where precert has been done, but I will check with precert coordinator.   Follow-up by: Abelino Derrick CMA Deborra Medina),  July 17, 2010 12:20 PM     Appended Document: Hematology referral Clinical Lists Changes  Orders: Added new Test order of [facility]) - Signed

## 2010-12-11 NOTE — Progress Notes (Signed)
Summary: hold iron?   ---- Converted from flag ---- ---- 07/06/2010 4:43 PM, Gatha Mayer MD, [facility] wrote: yes, at least a few days, not critical but best  ---- 07/06/2010 4:25 PM, Sundra Aland RN wrote: patient in p.v. should he hold iron pills he takes 3tabs daily before his proocedures. thanks ------------------------------  Appended Document: hold iron? Left message for patient letting him know this.Sundra Aland RN  July 06, 2010 4:51 PM    Clinical Lists Changes

## 2010-12-11 NOTE — Assessment & Plan Note (Signed)
Summary: fever--sweats--sdr--stc   Vital Signs:  Patient profile:   60 year old male Height:      68 inches (172.72 cm) Weight:      233.13 pounds (105.97 kg) O2 Sat:      97 % on Room air Temp:     97.6 degrees F (36.44 degrees C) oral Pulse rate:   74 / minute BP sitting:   104 / 72  (left arm) Cuff size:   large  Vitals Entered By: Gardenia Phlegm RMA (March 09, 2010 3:35 PM)  O2 Flow:  Room air CC: Fever, sweats X1 year off and on/ CF   Primary Provider:  Loanne Drilling  CC:  Fever and sweats X1 year off and on/ CF.  History of Present Illness: he takes metformin as rx'ed. pt states 1 year of intermittent diaporesis.  he has intermittent dry-quality cough, but no pain at the ears.  no associated sore throat.  cxr was normal since the start of his sxs. he takes fe as rx'ed.  Current Medications (verified): 1)  Atenolol 25 Mg  Tabs (Atenolol) .... Take 1 By Mouth Qd 2)  Plavix 75 Mg  Tabs (Clopidogrel Bisulfate) .... Take 1 By Mouth Qd 3)  Aspirin 81 Mg  Tbec (Aspirin) .... Take 1 By Mouth Qg 4)  Ferrous Sulfate 325 (65 Fe) Mg Tabs (Ferrous Sulfate) .... Once Daily 5)  Simvastatin 80 Mg Tabs (Simvastatin) .... Take 1 By Mouth Qd 6)  Metformin Hcl 500 Mg  Tabs (Metformin Hcl) .... 2 Tab Once Daily 7)  Nexium 40 Mg  Cpdr (Esomeprazole Magnesium) .... As Needed 8)  Lisinopril-Hydrochlorothiazide 10-12.5 Mg Tabs (Lisinopril-Hydrochlorothiazide) .Marland Kitchen.. 1 Once Daily  Allergies (verified): 1)  ! Iodine 2)  ! * Shellfish  Past History:  Past Medical History: Last updated: 07/11/2009 CORONARY ARTERY DISEASE (ICD-414.00) POSTURAL LIGHTHEADEDNESS (ICD-780.4) HYPERTENSION (ICD-401.9) HYPERLIPIDEMIA (ICD-272.4) GERD (ICD-530.81) DM (ICD-250.00) TOBACCO USER (ICD-305.1) FEVER UNSPECIFIED (ICD-780.60) ACUTE SINUSITIS, UNSPECIFIED (ICD-461.9) CELLULITIS AND ABSCESS OF UPPER ARM AND FOREARM (ICD-682.3) DIVERTICULOSIS, COLON (ICD-562.10) ANEMIA-IRON DEFICIENCY (ICD-280.9) HEMATURIA  UNSPECIFIED (ICD-599.70) BACK PAIN, LUMBAR (ICD-724.2) UNSPECIFIED DISORDER OF URETHRA&URINARY TRACT (ICD-599.9) SWEATING (ICD-780.8) ROUTINE GENERAL MEDICAL EXAM@HEALTH  CARE FACL (ICD-V70.0) SCREENING EXAMINATION UNSPEC INFECTIOUS DISEASE (ICD-V75.9) COUGH (ICD-786.2) URI (ICD-465.9)  Review of Systems  The patient denies abdominal pain.         denies dysuria, n/v, rash.  he has lost a few lbs, due to his efforts  Physical Exam  General:  no distress  Head:  head: no deformity eyes: no periorbital swelling, no proptosis external nose and ears are normal mouth: no lesion seen Neck:  Supple without thyroid enlargement or tenderness.  Lungs:  Clear to auscultation bilaterally. Normal respiratory effort.  Heart:  Regular rate and rhythm without murmurs or gallops noted. Normal S1,S2.   Abdomen:  abdomen is soft, nontender.  no hepatosplenomegaly.   not distended.  no hernia  Skin:  rash Cervical Nodes:  No significant adenopathy.  Additional Exam:  Hemoglobin           [L]  10.8 g/dL                   13.0-17.0   Hematocrit           [L]  32.4 %    Hemoglobin A1C       [L]  3.9 %    Impression & Recommendations:  Problem # 1:  FEVER UNSPECIFIED (ZOX-096.04) uncertain etiology  Problem # 2:  ANEMIA-IRON DEFICIENCY (ICD-280.9) needs  increased rx  Problem # 3:  DM (ICD-250.00) overcontrolled  Medications Added to Medication List This Visit: 1)  Ferrous Sulfate 325 (65 Fe) Mg Tabs (Ferrous sulfate) .... Once daily 2)  Ferrous Sulfate 325 (65 Fe) Mg Tabs (Ferrous sulfate) .... 2 tabs two times a day  Other Orders: T-Culture, Blood Routine ([id number]) TB Skin Test (92119) Admin 1st Vaccine (41740) TLB-BMP (Basic Metabolic Panel-BMET) (81448-JEHUDJS) TLB-CBC Platelet - w/Differential (85025-CBCD) TLB-Hepatic/Liver Function Pnl (80076-HEPATIC) TLB-TSH (Thyroid Stimulating Hormone) (84443-TSH) TLB-Sedimentation Rate (ESR) (85652-ESR) TLB-A1C / Hgb A1C  (Glycohemoglobin) (83036-A1C) TLB-Udip w/ Micro (81001-URINE) TLB-IBC Pnl (Iron/FE;Transferrin) (83550-IBC) Est. Patient Level IV (97026)  Patient Instructions: 1)  blood tests today. 2)  "ppd" skin test today. 3)  pending the test results, please continue the same medications for now. 4)  (update: i left message on phone-tree:  increase fe tabs to 2 tabs two times a day.  also d/c metformin, as this may help your sxs.) Prescriptions: ATENOLOL 25 MG  TABS (ATENOLOL) take 1 by mouth qd  #90 x 3   Entered and Authorized by:   Donavan Foil MD   Signed by:   Donavan Foil MD on 03/09/2010   Method used:   Print then Give to Patient   RxID:   [id number]    Immunizations Administered:  PPD Skin Test:    Vaccine Type: PPD    Site: right forearm    Mfr: [place]    Dose: 0.1 ml    Route: ID    Given by: Gardenia Phlegm RMA    Exp. Date: 10/27/2011    Lot #: [id number]  PPD Results    Date of reading: 03/12/2010    Results: < 41m    Interpretation: negative

## 2010-12-11 NOTE — Progress Notes (Signed)
  Medications Added METFORMIN HCL 500 MG TB24 (METFORMIN HCL)  SIMVASTATIN 80 MG TABS (SIMVASTATIN)        Phone Note Call from Patient Call back at Home Phone [phone]   Caller: Patient Call For: ellison Summary of Call: chills and and fever  for about an week now per pt want to see dr Loanne Drilling can see him Initial call taken by: Nonah Mattes,  November 19, 2007 1:17 PM  Follow-up for Phone Call        i called pt 1/8/9-- ov any md 1/9/8, here or urgent care please call pt tomorrow am  ? appt avail here 1/9/9 Follow-up by: Donavan Foil MD,  November 19, 2007 5:32 PM  Additional Follow-up for Phone Call Additional follow up Details #1::        November 20, 2007 8:52 AM.. phone busy November 20, 2007 4:04 PM called left msg on machine to call dr Loanne Drilling office for follow up on msg that was left  Additional Follow-up by: Nonah Mattes,  November 20, 2007 4:05 PM    New/Updated Medications: METFORMIN HCL 500 MG TB24 (METFORMIN HCL)  SIMVASTATIN 80 MG TABS (SIMVASTATIN)

## 2010-12-11 NOTE — Procedures (Signed)
Summary: EGD:    EGD  Procedure date:  11/07/2005  Findings:      Findings: Esophagitis  Findings: Gastritis  Findings: Hiatal Hernia Location: [place]   Comments: 1) Grade B Reflux Esophagitis  2) Erosive gastrits (ASA vs. H. pylori or both) 3) Small, sliding hiatal hernia  RUT=Negative  Patient Name: [name] [name], [name] [name] MRN:  Procedure Procedures: Panendoscopy (EGD) CPT: 25956.  Personnel: Endoscopist: Gatha Mayer, MD, [facility].  Referred By: Jacelyn Pi Loanne Drilling, MD.  Exam Location: Exam performed in Outpatient Clinic. Outpatient  Patient Consent: Procedure, Alternatives, Risks and Benefits discussed, consent obtained, from patient. Consent was obtained by the RN.  Indications Symptoms: Reflux symptoms  History  Current Medications: Patient is taking a non-steroidal medication. Patient is not currently taking Coumadin. Antiplatelet: Plavix (Clopidrogel) 75 mg QD,  Allergies: Patient is allergic to iodine.  Comments: Chronic heartburn. Remains on Plavix and ASA due to drug-eluting stent in LAD. Pre-Exam Physical: Performed Nov 07, 2005  Cardio-pulmonary exam, HEENT exam, Abdominal exam, Mental status exam WNL.  Comments: Pt. history reviewed/updated, physical exam performed prior to initiation of sedation? YES Exam Exam Info: Maximum depth of insertion Duodenum, intended Duodenum. Patient position: on left side. Gastric retroflexion performed. Images taken. ASA Classification: III. Tolerance: good.  Sedation Meds: Patient assessed and found to be appropriate for moderate (conscious) sedation. Residual sedation present from prior procedure today. Cetacaine Spray 2 sprays given aerosolized.  Monitoring: BP and pulse monitoring done. Oximetry used. Supplemental O2 given  Findings Normal: Proximal Esophagus to Mid Esophagus.  ESOPHAGEAL INFLAMMATION: suspected as a result of reflux. Proximal margin 39 cm from mouth,  distal margin 40 cm.  Length of inflammation: 1 cm. [place] Classification: Grade B. ICD9: Esophagitis, Reflux: 530.11.  HIATAL HERNIA: Z-line/GE Junction 40 cm from mouth. Comments: small, sliding 1-2 cm.  Normal: Fundus to Body.  MUCOSAL ABNORMALITY: Antrum. Erosions present. Erythematous mucosa. Biopsy/Mucosal Abn taken. RUT done, results pending. ICD9: Gastritis, Unspecified: 535.50.  Normal: Duodenal Bulb to Duodenal 2nd Portion.   Assessment  Diagnoses: 530.11: Esophagitis, Reflux.  535.50: Gastritis, Unspecified.   Comments: 1) Grade B Reflux Esophagitis  2) Erosive gastrits (ASA vs. H. pylori or both) 3) Small, sliding hiatal hernia Events  Unplanned Intervention: No unplanned interventions were required.  Plans Comments: Treat H. pylori if positive. Start PPI: Prilosec OTC vs. Nexium (Tier 2). Disposition: After procedure patient sent to recovery. After recovery patient sent home.  Scheduling: Clinic Visit, to Gatha Mayer, MD, [facility], 2 months to discuss reflux   CC:   [name], MD  This report was created from the original endoscopy report, which was reviewed and signed by the above listed endoscopist.

## 2010-12-11 NOTE — Progress Notes (Signed)
Summary: Triage/Labs/Procedures Scheduled   Phone Note Call from Patient Call back at Home Phone [phone] Call back at Work Phone [phone]   Caller: Patient Call For: Dr. [name] Reason for Call: Talk to Nurse Summary of Call: would like to know what the "next step" is Initial call taken by: Lucien Mons,  July 04, 2010 11:54 AM  Follow-up for Phone Call        Pt. saw Tye Savoy NP on 06-18-10. He did have his blood transfusion. Now calling to find out "What is he going to do next?"  DR.Choua Ikner--Please advise  Follow-up by: Vivia Ewing LPN,  July 04, 6700 12:23 PM  Additional Follow-up for Phone Call Additional follow up Details #1::        I have reviewed charts 1) cbc to follow-up anemia 2) he should have a push enteroscopy and colonoscopy with possible AVM ablation (at hospital where APC available) OFF PLAVIX so we need to get clearance to hold 5-7 days 3) is he feeling better? Additional Follow-up by: Gatha Mayer MD, Marval Regal,  July 04, 2010 3:46 PM    Additional Follow-up for Phone Call Additional follow up Details #2::    Message left for patient to callback. Vivia Ewing LPN  July 04, 4102 4:34 PM  Message left for patient to callback. Vivia Ewing LPN  July 05, 130 9:11 AM  Message left for patient to callback. Vivia Ewing LPN  July 06, 4387 1:13 PM   Additional Follow-up for Phone Call Additional follow up Details #3:: Details for Additional Follow-up Action Taken: 1) Pt. will have CBC done at [facility] on 07-06-10  2) Push Enteroscopy/Colonoscopy w/ERBE standby is scheduled at [facility] on 07-20-10 at 9:00am. I will get Plavix hold clearance from Dr.Wall. (pre visit is 07-06-10 at 4pm)   3) Pt. states he is feeling better, but still some fatigue and SOB. Additional Follow-up by: Vivia Ewing LPN,  July 05, 8756 2:57 PM  OK  Gatha Mayer MD, [facility]  July 05, 2010 6:04 PM  Appended Document: Triage/Labs/Procedures  Scheduled Colletta Maryland will add B12 and folate to his CBC today. Pam is arranging for patient to see hematology.  Appended Document: Triage/Labs/Procedures Scheduled Called Dr. Azucena Freed nurse and Lm on voice mail for her to please call me. I advised Dr. Silvano Rusk and Tye Savoy RNP feel the pt should see a hemotologist due to Anemia.  Will fax records to them once they call me about appt for this pt.  Appended Document: Triage/Labs/Procedures Scheduled I got a call from Dr. Azucena Freed nurse and I faxed the pt's demographics, insurance cards,and  labs and office notes from [facility] RNP, Dr Loanne Drilling, Dr [name].

## 2010-12-11 NOTE — Assessment & Plan Note (Signed)
Summary: shortness of breathe/light headed-lb   Vital Signs:  Patient profile:   60 year old male Height:      68 inches (172.72 cm) Weight:      231.25 pounds (105.11 kg) BMI:     35.29 O2 Sat:      98 % on Room air Temp:     98.6 degrees F (37.00 degrees C) oral Pulse rate:   66 / minute BP sitting:   120 / 82  (left arm) Cuff size:   large  Vitals Entered By: Rebeca Alert MA (June 15, 2010 3:23 PM)  O2 Flow:  Room air CC: SOB x 1 week/aj Is Patient Diabetic? Yes   Primary Provider:  Loanne Drilling  CC:  SOB x 1 week/aj.  History of Present Illness: 5 days of slight wheezing in the chest, and associated pain at the periumbilical area.  he had 1 episode of n/v, but the nausea is improved.     Current Medications (verified): 1)  Atenolol 25 Mg  Tabs (Atenolol) .... Take 1 By Mouth Qd 2)  Plavix 75 Mg  Tabs (Clopidogrel Bisulfate) .... Take 1 By Mouth Qd 3)  Aspirin 81 Mg  Tbec (Aspirin) .... Take 1 By Mouth Qg 4)  Ferrous Sulfate 325 (65 Fe) Mg Tabs (Ferrous Sulfate) .... 2 Tabs Two Times A Day 5)  Simvastatin 80 Mg Tabs (Simvastatin) .... Take 1 By Mouth Qd 6)  Nexium 40 Mg  Cpdr (Esomeprazole Magnesium) .... As Needed 7)  Lisinopril-Hydrochlorothiazide 10-12.5 Mg Tabs (Lisinopril-Hydrochlorothiazide) .Marland Kitchen.. 1 Once Daily  Allergies (verified): 1)  ! Iodine 2)  ! * Shellfish  Past History:  Past Medical History: Last updated: 07/11/2009 CORONARY ARTERY DISEASE (ICD-414.00) POSTURAL LIGHTHEADEDNESS (ICD-780.4) HYPERTENSION (ICD-401.9) HYPERLIPIDEMIA (ICD-272.4) GERD (ICD-530.81) DM (ICD-250.00) TOBACCO USER (ICD-305.1) FEVER UNSPECIFIED (ICD-780.60) ACUTE SINUSITIS, UNSPECIFIED (ICD-461.9) CELLULITIS AND ABSCESS OF UPPER ARM AND FOREARM (ICD-682.3) DIVERTICULOSIS, COLON (ICD-562.10) ANEMIA-IRON DEFICIENCY (ICD-280.9) HEMATURIA UNSPECIFIED (ICD-599.70) BACK PAIN, LUMBAR (ICD-724.2) UNSPECIFIED DISORDER OF URETHRA&URINARY TRACT (ICD-599.9) SWEATING  (ICD-780.8) ROUTINE GENERAL MEDICAL EXAM@HEALTH  CARE FACL (ICD-V70.0) SCREENING EXAMINATION UNSPEC INFECTIOUS DISEASE (ICD-V75.9) COUGH (ICD-786.2) URI (ICD-465.9)  Review of Systems  The patient denies syncope and hematochezia.         he has slight lightheadedness.    Physical Exam  General:  obese.  no distress  Head:  head: no deformity eyes: no periorbital swelling, no proptosis external nose and ears are normal mouth: no lesion seen Lungs:  Clear to auscultation bilaterally. Normal respiratory effort.  Abdomen:  abdomen is soft, nontender.  no hepatosplenomegaly.   not distended.  no hernia  Additional Exam:  (update: i am advised at 16:55 that hemoglobin is 7.9)   Impression & Recommendations:  Problem # 1:  ABDOMINAL PAIN, UNSPECIFIED SITE (RDE-081.44) uncertain etiology  Problem # 2:  severe anemia ? related to #1  Problem # 3:  DM (ICD-250.00) usually well-controlled  Medications Added to Medication List This Visit: 1)  Cefuroxime Axetil 250 Mg Tabs (Cefuroxime axetil) .Marland Kitchen.. 1 tab two times a day 2)  Promethazine-dm 6.25-15 Mg/46m Syrp (Promethazine-dm) ..Marland Kitchen. 1 teaspoon every every 4 hrs as needed for cough  Other Orders: TLB-A1C / Hgb A1C (Glycohemoglobin) (83036-A1C) TLB-IBC Pnl (Iron/FE;Transferrin) (83550-IBC) TLB-CBC Platelet - w/Differential (85025-CBCD) TLB-Amylase (82150-AMYL) TLB-BMP (Basic Metabolic Panel-BMET) ([id number] TLB-Hepatic/Liver Function Pnl (80076-HEPATIC) Est. Patient Level IV ((63785   Patient Instructions: 1)  blood tests are being ordered for you today.  please call [phone] hear your test results. 2)  Please schedule a  physical appointment in 3 months. 3)  cefuroxime 250 mg two times a day. 4)  here is a sample of symbicort-160.  take 1 puff two times a day.  rinse mouth after using. 5)  (update: i called pt 06/15/10, at 17:01 at work.  he is not there.  i called him at home, and left message on voice mail.  go to er  now.) Prescriptions: PROMETHAZINE-DM 6.25-15 MG/5ML SYRP (PROMETHAZINE-DM) 1 teaspoon every every 4 hrs as needed for cough  #8 oz x 1   Entered and Authorized by:   Donavan Foil MD   Signed by:   Donavan Foil MD on 06/15/2010   Method used:   Electronically to        [facility]. [phone]* (retail)       [address] [place]  [address]       Ph: [phone] or [phone]       Fax: [phone]   RxID:   [id number] CEFUROXIME AXETIL 250 MG TABS (CEFUROXIME AXETIL) 1 tab two times a day  #14 x 0   Entered and Authorized by:   Donavan Foil MD   Signed by:   Donavan Foil MD on 06/15/2010   Method used:   Electronically to        [facility]. [phone]* (retail)       [address]       [place], [place]  [address]       Ph: [phone] or [phone]       Fax: [phone]   RxID:   [id number]

## 2010-12-11 NOTE — Assessment & Plan Note (Signed)
Summary: f1y/dfg      Allergies Added:   Visit Type:  Follow-up Primary Provider:  [name], MD  CC:  60 yr fu.  History of Present Illness: [name] [name] comes in today for evaluation and management of his coronary artery disease.  He is having no angina or ischemic symptoms.  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 His hemoglobin got down to 7. He did not have any angina or coronary symptoms. He has been on iron therapy after ablation and his last hemoglobin was above 14. All these records reviewed and also reviewed with the patient today.  His question is should he remain on Plavix. He has been on Plavix for a number of years for her coronary disease and previous stent. His stress echocardiogram last year was normal with good LV function and no ischemia. His maximum heart rate during that study was 112% of maximum.  He denies any orthopnea, PND or edema.  Patient had dental surgery soon and would like to have permission to come off the Plavix for 5 days.  Current Medications (verified): 1)  Atenolol 25 Mg  Tabs (Atenolol) .... Take 1 By Mouth Qd 2)  Plavix 75 Mg  Tabs (Clopidogrel Bisulfate) .... Take 1 By Mouth Qd 3)  Aspirin 81 Mg  Tbec (Aspirin) .... Take 1 By Mouth Qg 4)  Simvastatin 80 Mg Tabs (Simvastatin) .... Take 1 By Mouth Qd 5)  Nexium 40 Mg  Cpdr (Esomeprazole Magnesium) .... As Needed 6)  Lisinopril-Hydrochlorothiazide 10-12.5 Mg Tabs (Lisinopril-Hydrochlorothiazide) .Marland Kitchen.. 1 Once Daily  Allergies (verified): 1)  ! Iodine 2)  ! * Shellfish  Comments:  Nurse/Medical Assistant: patient reviewed med list and stated the meds are correct the only change  Dr.Gessener stopped his ferrous sulfate 325  Past History:  Past Medical History: Last updated: 06/18/2010 CORONARY ARTERY DISEASE (ICD-414.00) POSTURAL LIGHTHEADEDNESS (ICD-780.4) HYPERTENSION (ICD-401.9) HYPERLIPIDEMIA (ICD-272.4) GERD (ICD-530.81) DM (ICD-250.00) TOBACCO USER (ICD-305.1) FEVER  UNSPECIFIED (ICD-780.60) ACUTE SINUSITIS, UNSPECIFIED (ICD-461.9) CELLULITIS AND ABSCESS OF UPPER ARM AND FOREARM (ICD-682.3) DIVERTICULOSIS, COLON (ICD-562.10) ANEMIA-IRON DEFICIENCY (ICD-280.9) HEMATURIA UNSPECIFIED (ICD-599.70) BACK PAIN, LUMBAR (ICD-724.2) UNSPECIFIED DISORDER OF URETHRA&URINARY TRACT (ICD-599.9) SWEATING (ICD-780.8) ROUTINE GENERAL MEDICAL EXAM@HEALTH  CARE FACL (ICD-V70.0) SCREENING EXAMINATION UNSPEC INFECTIOUS DISEASE (ICD-V75.9) COUGH (ICD-786.2) URI (ICD-465.9) SMALL BOWEL AVMS  Past Surgical History: Last updated: 06/02/2007 PTCA/stent EDG (11/07/2005) Stress Cardiolite (11/30/2004) EKG (03/30/2007)  Family History: Last updated: 07/11/2009 sister had breast cancer mother had colon cancer at advanced age  Social History: Last updated: 06/18/2010 single works [employer] Alcohol use-yes - rare Current Smoker no children Daily Caffeine Use  Risk Factors: Smoking Status: current (11/18/2008)  Review of Systems       negative other than history of present illness  Vital Signs:  Patient profile:   60 year old male Weight:      236 pounds O2 Sat:      97 % on Room air Pulse rate:   66 / minute BP sitting:   155 / 98  (right arm)  Vitals Entered By: Doretha Sou, CNA (October 12, 2010 2:15 PM)  O2 Flow:  Room air  Physical Exam  General:  obese.   Head:  normocephalic and atraumatic Eyes:  PERRLA/EOM intact; conjunctiva and lids normal. Neck:  Neck supple, no JVD. No masses, thyromegaly or abnormal cervical nodes. Chest Travanti Mcmanus:  no deformities or breast masses noted Lungs:  Clear bilaterally to auscultation and percussion. Heart:  PMI nondisplaced, regular rate and rhythm, normal S1-S2, no  gallop, no carotid bruits Msk:  Back normal, normal gait. Muscle strength and tone normal. Pulses:  pulses normal in all 4 extremities Extremities:  No clubbing or cyanosis. Neurologic:  Alert and oriented x 3. Skin:  Intact without  lesions or rashes. Psych:  Normal affect.   EKG  Procedure date:  10/12/2010  Findings:      normal sinus rhythm, right bundle branch block, no acute changes  Impression & Recommendations:  Problem # 1:  CORONARY ARTERY DISEASE (ICD-414.00) Assessment Unchanged Currently stable. After long discussion, we decided to keep him on both Plavix and aspirin. If he has recurrent bleeding in the near future, we could discontinue it. His risk of stent thrombosis would be quite low. He is more comfortable with taking. We'll stop it for 5 days for his dental surgery. Note written. Followup with me in 59 years. His updated medication list for this problem includes:    Atenolol 25 Mg Tabs (Atenolol) .Marland Kitchen... Take 1 by mouth qd    Plavix 75 Mg Tabs (Clopidogrel bisulfate) .Marland Kitchen... Take 1 by mouth qd    Aspirin 81 Mg Tbec (Aspirin) .Marland Kitchen... Take 1 by mouth qg    Lisinopril-hydrochlorothiazide 10-12.5 Mg Tabs (Lisinopril-hydrochlorothiazide) .Marland Kitchen... 1 once daily  Problem # 2:  RBBB (ICD-426.4) Assessment: Unchanged  His updated medication list for this problem includes:    Atenolol 25 Mg Tabs (Atenolol) .Marland Kitchen... Take 1 by mouth qd    Plavix 75 Mg Tabs (Clopidogrel bisulfate) .Marland Kitchen... Take 1 by mouth qd    Aspirin 81 Mg Tbec (Aspirin) .Marland Kitchen... Take 1 by mouth qg    Lisinopril-hydrochlorothiazide 10-12.5 Mg Tabs (Lisinopril-hydrochlorothiazide) .Marland Kitchen... 1 once daily  Patient Instructions: 1)  Your physician recommends that you schedule a follow-up appointment in: 12 months 2)  Your physician recommends that you continue on your current medications as directed. Please refer to the Current Medication list given to you today.

## 2010-12-11 NOTE — Procedures (Signed)
Summary: Preparation for Colonoscopy / [facility]  Preparation for Colonoscopy / [facility]   Imported By: Rise Patience 07/10/2010 14:08:37  _____________________________________________________________________  External Attachment:    Type:   Image     Comment:   External Document

## 2010-12-11 NOTE — Progress Notes (Signed)
Summary: needs to change appt   Phone Note Call from Patient Call back at Work Phone [phone]   Caller: Patient Reason for Call: Talk to Nurse, Talk to Doctor Summary of Call: pt made an appt to see Dr. Verl Blalock earlier and cant make it cause of work wants to know can he go to [place] that day Initial call taken by: Shelda Pal,  October 09, 2010 3:31 PM  Follow-up for Phone Call        Phone Call Completed PT AWARE MD NOT IN [place] OFFICE THAT DAY (11/01/10)PT TO CALL [place] OFFICE  LATER TODAY TO RESCHEDULE APPT . Follow-up by: Devra Dopp, LPN,  October 10, 8471 3:57 PM

## 2010-12-11 NOTE — Progress Notes (Signed)
Summary: needs doc note  Phone Note Call from Patient Call back at Home Phone [phone]   Caller: Patient Call For: DR [name] Reason for Call: Talk to Nurse Summary of Call: Patient needs  note faxed over to his employer stating that it's oj for him to return back to work. fax [phone] Att: Lattie Haw Initial call taken by: Ronalee Red,  June 21, 2010 1:12 PM  Follow-up for Phone Call        per patient he needs note to return to work on Sunday, 06/24/10.  Fax number verified and it is correct. Per Nevin Bloodgood, pt is OK to return to work.  Note is written and faxed per patient request. Follow-up by: Abelino Derrick CMA Deborra Medina),  June 21, 2010 2:41 PM     Appended Document: needs doc note PC to patient @ 8:52 this morning advising him that our fax is not going through to his employer after 20 attempts by our fax machine.  Advised pt to call me with a different number or to come by the office to pick up the note.  Appended Document: needs doc note pt called with correct fax number [phone].  letter faxed to employer. (this was not done sooner as I was on vacation and pt waited to speak with me.)

## 2010-12-11 NOTE — Letter (Signed)
Summary: Chart Note / [place]  Chart Note / [place]   Imported By: Rise Patience 09/20/2010 13:57:33  _____________________________________________________________________  External Attachment:    Type:   Image     Comment:   External Document

## 2010-12-11 NOTE — Progress Notes (Signed)
Summary: cbc and ferritin normal  Phone Note Outgoing Call   Summary of Call: let him know Hgb and iron are normal. HJe should take iron for 1 more month and then can stop. Repeat CBC in 4 months Gatha Mayer MD, [facility]  September 05, 2010 6:22 PM   Follow-up for Phone Call        LM to [facility] at home number Conner Deborra Medina)  September 07, 2010 10:11 AM   Pt aware.  He is agreeable.  Order entered in [place] [facility] for labs 01/06/11 Follow-up by: Abelino Derrick CMA Deborra Medina),  September 07, 2010 10:18 AM

## 2010-12-11 NOTE — Progress Notes (Signed)
  Phone Note Other Incoming   Summary of Call: Pt came in and dropped off a letter stating that when MD sent him to [facility] when blood level was down to 4, it was found that he has a slow leak in blood vessel in upper intestines? Pt would like to know if he should up his Iron again? Please advise? Initial call taken by: Gardenia Phlegm RMA,  January 19, 2010 3:55 PM  Follow-up for Phone Call        i am aware of the old problem.  please make sure pt had lab drawn today as i requested.  if not, please ask him to do.  if so, i'll let you know about results. Follow-up by: Donavan Foil MD,  January 19, 2010 4:02 PM  Additional Follow-up for Phone Call Additional follow up Details #1::        Left message for pt to return my call. Additional Follow-up by: Gardenia Phlegm RMA,  January 19, 2010 4:15 PM    Additional Follow-up for Phone Call Additional follow up Details #2::    Phone doesn't even ring, it goes straight to voicemail. Left another message to return my call. 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 Follow-up by: Gardenia Phlegm RMA,  January 22, 2010 12:48 PM  Additional Follow-up for Phone Call Additional follow up Details #3:: Details for Additional Follow-up Action Taken: Informed pt. Additional Follow-up by: Gardenia Phlegm RMA,  January 23, 2010 3:47 PM

## 2010-12-11 NOTE — Progress Notes (Signed)
Summary: QUESTION ABOUT PREMEDS FOR DENTAL VISIT FRIDAY/LM    Phone Note Call from Patient Call back at Home Phone [phone]   Caller: Patient Reason for Call: Talk to Nurse Summary of Call: PATIENT [name] [name] KitchenMarland KitchenSHOULD HE BE PREMEDICATED PRIOR?  WOULD LIKE RECOMMENDATION IN WRITING.   Initial call taken by: Alexander Bergeron,  July 19, 2009 4:30 PM  Follow-up for Phone Call        [facility]. Carollee Sires, RN, BSN  July 19, 2009 4:57 PM   Additional Follow-up for Phone Call Additional follow up Details #1::        No antibiotic needed.  Reviewed Mar Daring, MD  Additional Follow-up by: Renella Cunas, MD, [facility],  July 19, 2009 5:26 PM     Appended Document: QUESTION ABOUT PREMEDS FOR DENTAL VISIT FRIDAY/LM  PT AWARE OKAY TO PROCEED  WITH DENTAL PROCEDURE  WITHOUT OUT ABT./CY

## 2010-12-11 NOTE — Miscellaneous (Signed)
Summary: Previsit prep/RM  Clinical Lists Changes  Medications: Added new medication of MOVIPREP 100 GM  SOLR (PEG-KCL-NACL-NASULF-NA ASC-C) As per prep instructions. - Signed Rx of MOVIPREP 100 GM  SOLR (PEG-KCL-NACL-NASULF-NA ASC-C) As per prep instructions.;  #1 x 0;  Signed;  Entered by: Sundra Aland RN;  Authorized by: Gatha Mayer MD, FACG;  Method used: Electronically to [facility] [phone] [address], [place], [place]  [address], Ph: [phone] or [phone], Fax: [phone] Observations: Added new observation of ALLERGY REV: Done (07/06/2010 16:12)    Prescriptions: MOVIPREP 100 GM  SOLR (PEG-KCL-NACL-NASULF-NA ASC-C) As per prep instructions.  #1 x 0   Entered by:   Sundra Aland RN   Authorized by:   Gatha Mayer MD, [facility]   Signed by:   Sundra Aland RN on 07/06/2010   Method used:   Electronically to        [facility] [phone]* (retail)       [address]       [place], [place]  [address]       Ph: [phone] or [phone]       Fax: [phone]   RxID:   [id number]

## 2010-12-11 NOTE — Assessment & Plan Note (Signed)
Summary: LEFT LOWER BACK PAIN--$50--ER IF WORSEN-STC   Vital Signs:  Patient Profile:   60 Years Old Male Height:     68 inches Weight:      238.6 pounds O2 Sat:      93 % O2 treatment:    Room Air Temp:     97.7 degrees F oral Pulse rate:   76 / minute BP sitting:   138 / 84  (left arm) Cuff size:   regular  Vitals Entered By: Tomma Lightning (September 01, 2008 11:00 AM)                 PCP:  Loanne Drilling  Chief Complaint:  (L) back pain.  History of Present Illness: 1 day left lumbar paravertebral pain.  he says he was sweaty, had chills, and coworker said he felt warm.  now feels much better.    Prior Medications Reviewed Using: Patient Recall  Updated Prior Medication List: ATENOLOL 25 MG  TABS (ATENOLOL) take 1 by mouth qd PLAVIX 75 MG  TABS (CLOPIDOGREL BISULFATE) take 1 by mouth qd LISINOPRIL-HYDROCHLOROTHIAZIDE 20-12.5 MG  TABS (LISINOPRIL-HYDROCHLOROTHIAZIDE) take 1 by mouth qd ASPIRIN 81 MG  TBEC (ASPIRIN) take 1 by mouth qg ZOCOR 80 MG  TABS (SIMVASTATIN) take 1 by mouth qd FERROUS SULFATE 324 MG  TABS (FERROUS SULFATE) take 1 by mouth qd SIMVASTATIN 80 MG TABS (SIMVASTATIN) take 1 by mouth qd METFORMIN HCL 500 MG  TABS (METFORMIN HCL) TAKE 4 by mouth QD [BMN] NEXIUM 40 MG  CPDR (ESOMEPRAZOLE MAGNESIUM) qd  Current Allergies (reviewed today): ! IODINE  Past Medical History:    Reviewed history from 06/13/2008 and no changes required:       Smoker       Chronic sinusitis       DM (ICD-250.00)       SCREENING EXAMINATION UNSPEC INFECTIOUS DISEASE (ICD-V75.9)       COUGH (ICD-786.2)       URI (ICD-465.9)       HYPERTENSION (ICD-401.9)       HYPERLIPIDEMIA (ICD-272.4)       GERD (ICD-530.81)       CORONARY ARTERY DISEASE (ICD-414.00)     Review of Systems  The patient denies fever.     Physical Exam  General:     healthy appearing.   Msk:     back is nontender    Impression & Recommendations:  Problem # 1:  BACK PAIN, LUMBAR (ICD-724.2)   Other Orders: UA Dipstick w/o Micro (manual) (81002) Admin 1st Vaccine (95638) Flu Vaccine 72yr + (([address] Est. Patient Level III ((32951   Patient Instructions: 1)  ret if sxs recur  Flu Vaccine Consent Questions     Do you have a history of severe allergic reactions to this vaccine? no    Any prior history of allergic reactions to egg and/or gelatin? no    Do you have a sensitivity to the preservative Thimersol? no    Do you have a past history of Guillan-Barre Syndrome? no    Do you currently have an acute febrile illness? no    Have you ever had a severe reaction to latex? no    Vaccine information given and explained to patient? yes    Are you currently pregnant? no    Lot Number:[id number]   Exp Date:05/10/2009   Site Given  Left Deltoid IM  ] Laboratory Results   Urine Tests    Routine Urinalysis   Color: yellow Appearance: Clear Glucose: negative   (  Normal Range: Negative) Bilirubin: negative   (Normal Range: Negative) Ketone: negative   (Normal Range: Negative) Spec. Gravity: <1.005   (Normal Range: 1.003-1.035) Blood: moderate   (Normal Range: Negative) pH: 5.0   (Normal Range: 5.0-8.0) Protein: negative   (Normal Range: Negative) Urobilinogen: 0.2   (Normal Range: 0-1) Nitrite: negative   (Normal Range: Negative) Leukocyte Esterace: negative   (Normal Range: Negative)

## 2010-12-11 NOTE — Assessment & Plan Note (Signed)
Summary: FEVERS AND SWEATS/NWS $50   Vital Signs:  Patient profile:   60 year old male Height:      68 inches Weight:      237 pounds BMI:     36.17 O2 Sat:      96 % Temp:     97.8 degrees F oral Pulse rate:   63 / minute BP sitting:   150 / 98  (right arm) Cuff size:   regular  Vitals Entered By: Hector Brunswick (June 30, 2009 10:51 AM) CC: fever and sweats x 1-2 weeks   Primary Care Provider:  Loanne Drilling  CC:  fever and sweats x 1-2 weeks.  History of Present Illness: here after tx last visit with course of ceftin; felt better for several days to start, but then about mid course begain feeling poorly again it seems with recurrent low grade but significant fevers , sweats, malaise and general feeling weak and bad.  Specficially denies headache, ear problems, ST, cough, wheezing, CP, sob, abd pain, dysuria, freq or urgency or hesitancy, diarrhea, rash, joint pains, wt loss, or other constitutional symtpoms.  States he has hx of anemia and would like to be checked for that as well as any other blood work that might be important.  Does not feel he needs checked for HIV as he states he was checked last year and denies any risk behavior since then.  No one else sick at home or near him otherwise that he is aware.  He does mention and wonders about TB for some reason but denies exposure in the past or hx of pos PPD .  No overt bleeding or orthostasis  Problems Prior to Update: 1)  Fever Unspecified  (ICD-780.60) 2)  Acute Sinusitis, Unspecified  (ICD-461.9) 3)  Cellulitis and Abscess of Upper Arm and Forearm  (ICD-682.3) 4)  Diverticulosis, Colon  (ICD-562.10) 5)  Anemia-iron Deficiency  (ICD-280.9) 6)  Hematuria Unspecified  (ICD-599.70) 7)  Back Pain, Lumbar  (ICD-724.2) 8)  Unspecified Disorder of Urethra&urinary Tract  (ICD-599.9) 9)  Sweating  (ICD-780.8) 10)  Routine General Medical Exam@health  Care Facl  (ICD-V70.0) 11)  Dm  (ICD-250.00) 12)  Screening Examination Unspec  Infectious Disease  (ICD-V75.9) 13)  Cough  (ICD-786.2) 14)  Uri  (ICD-465.9) 15)  Hypertension  (ICD-401.9) 16)  Hyperlipidemia  (ICD-272.4) 17)  Gerd  (ICD-530.81) 18)  Coronary Artery Disease  (ICD-414.00)  Medications Prior to Update: 1)  Atenolol 25 Mg  Tabs (Atenolol) .... Take 1 By Mouth Qd 2)  Plavix 75 Mg  Tabs (Clopidogrel Bisulfate) .... Take 1 By Mouth Qd 3)  Lisinopril-Hydrochlorothiazide 20-12.5 Mg  Tabs (Lisinopril-Hydrochlorothiazide) .... Take 1 By Mouth Qd 4)  Aspirin 81 Mg  Tbec (Aspirin) .... Take 1 By Mouth Qg 5)  Ferrous Sulfate 324 Mg  Tabs (Ferrous Sulfate) .... Take 1 By Mouth Qd 6)  Simvastatin 80 Mg Tabs (Simvastatin) .... Take 1 By Mouth Qd 7)  Metformin Hcl 500 Mg  Tabs (Metformin Hcl) .... Take 4 By Mouth Qd 8)  Nexium 40 Mg  Cpdr (Esomeprazole Magnesium) .... Qd 9)  Ceftin 250 Mg Tabs (Cefuroxime Axetil) .Marland Kitchen.. 1 By Mouth Two Times A Day 10)  Tessalon Perles 100 Mg Caps (Benzonatate) .Marland Kitchen.. 1 - 2 By Mouth Three Times A Day As Needed  Current Medications (verified): 1)  Atenolol 25 Mg  Tabs (Atenolol) .... Take 1 By Mouth Qd 2)  Plavix 75 Mg  Tabs (Clopidogrel Bisulfate) .... Take 1 By Mouth Qd 3)  Lisinopril-Hydrochlorothiazide 20-12.5 Mg  Tabs (Lisinopril-Hydrochlorothiazide) .... Take 1 By Mouth Qd 4)  Aspirin 81 Mg  Tbec (Aspirin) .... Take 1 By Mouth Qg 5)  Ferrous Sulfate 324 Mg  Tabs (Ferrous Sulfate) .... Take 1 By Mouth Qd 6)  Simvastatin 80 Mg Tabs (Simvastatin) .... Take 1 By Mouth Qd 7)  Metformin Hcl 500 Mg  Tabs (Metformin Hcl) .... Take 4 By Mouth Qd 8)  Nexium 40 Mg  Cpdr (Esomeprazole Magnesium) .... Qd 9)  Ceftin 250 Mg Tabs (Cefuroxime Axetil) .Marland Kitchen.. 1 By Mouth Two Times A Day 10)  Tessalon Perles 100 Mg Caps (Benzonatate) .Marland Kitchen.. 1 - 2 By Mouth Three Times A Day As Needed  Allergies (verified): 1)  ! Iodine 2)  ! * Shellfish  Past History:  Past Medical History: Last updated: 11/18/2008 Smoker Chronic sinusitis DM  (ICD-250.00) SCREENING EXAMINATION UNSPEC INFECTIOUS DISEASE (ICD-V75.9) COUGH (ICD-786.2) URI (ICD-465.9) HYPERTENSION (ICD-401.9) HYPERLIPIDEMIA (ICD-272.4) GERD (ICD-530.81) CORONARY ARTERY DISEASE (ICD-414.00) Hyperlipidemia Coronary artery disease Anemia-iron deficiency DJD Diverticulosis, colon  Past Surgical History: Last updated: 06/02/2007 PTCA/stent EDG (11/07/2005) Stress Cardiolite (11/30/2004) EKG (03/30/2007)  Social History: Last updated: 11/18/2008 single works [employer] Alcohol use-yes - rare Current Smoker no children  Risk Factors: Smoking Status: current (11/18/2008)  Review of Systems       all otherwise negative - 12 system review done   Physical Exam  General:  alert and overweight-appearing.  , somewhat clammy and perspiring, uncomfortable, restless Head:  normocephalic and atraumatic.   Eyes:  vision grossly intact, pupils equal, and pupils round.   Ears:  R ear normal and L ear normal.   Nose:  no external deformity and no nasal discharge.   Mouth:  no gingival abnormalities and pharynx pink and moist.   Neck:  supple and no masses.   Lungs:  normal respiratory effort and normal breath sounds.   Heart:  normal rate, regular rhythm, and no murmur.   Abdomen:  soft, non-tender, and normal bowel sounds.   Msk:  no joint tenderness and no joint swelling.   Extremities:  no edema, no erythema  Skin:  no rashes and no ulcerations.     Impression & Recommendations:  Problem # 1:  FEVER UNSPECIFIED (ICD-780.60) subjective and etiology unclear - history and PE o/w unrevealing, Ok to check labs, follow with expectant management - ? viral illness, consider echo Orders: T-Culture, Urine ([id number]) TLB-BMP (Basic Metabolic Panel-BMET) (44010-[id number]) TLB-CBC Platelet - w/Differential (85025-CBCD) TLB-Hepatic/Liver Function Pnl (80076-HEPATIC) TLB-Udip w/ Micro (81001-URINE) T-2 View CXR, Same Day (71020.5TC)  Problem # 2:   ANEMIA-IRON DEFICIENCY (ICD-280.9)  His updated medication list for this problem includes:    Ferrous Sulfate 324 Mg Tabs (Ferrous sulfate) .Marland Kitchen... Take 1 by mouth qd to check cbc as above  Problem # 3:  HYPERTENSION (ICD-401.9)  His updated medication list for this problem includes:    Atenolol 25 Mg Tabs (Atenolol) .Marland Kitchen... Take 1 by mouth qd    Lisinopril-hydrochlorothiazide 20-12.5 Mg Tabs (Lisinopril-hydrochlorothiazide) .Marland Kitchen... Take 1 by mouth qd  BP today: 150/98 Prior BP: 114/80 (06/08/2009)  Labs Reviewed: K+: 4.2 (06/06/2008) Creat: : 0.8 (06/06/2008)   Chol: 135 (06/06/2008)   HDL: 42.2 (06/06/2008)   LDL: 63 (06/06/2008)   TG: 149 (06/06/2008) mild elev today, likely situational, ok to follow, continue same treatment   Complete Medication List: 1)  Atenolol 25 Mg Tabs (Atenolol) .... Take 1 by mouth qd 2)  Plavix 75 Mg Tabs (Clopidogrel bisulfate) .... Take 1 by mouth  qd 3)  Lisinopril-hydrochlorothiazide 20-12.5 Mg Tabs (Lisinopril-hydrochlorothiazide) .... Take 1 by mouth qd 4)  Aspirin 81 Mg Tbec (Aspirin) .... Take 1 by mouth qg 5)  Ferrous Sulfate 324 Mg Tabs (Ferrous sulfate) .... Take 1 by mouth qd 6)  Simvastatin 80 Mg Tabs (Simvastatin) .... Take 1 by mouth qd 7)  Metformin Hcl 500 Mg Tabs (Metformin hcl) .... Take 4 by mouth qd 8)  Nexium 40 Mg Cpdr (Esomeprazole magnesium) .... Qd 9)  Ceftin 250 Mg Tabs (Cefuroxime axetil) .Marland Kitchen.. 1 by mouth two times a day 10)  Tessalon Perles 100 Mg Caps (Benzonatate) .Marland Kitchen.. 1 - 2 by mouth three times a day as needed  Other Orders: TLB-Lipid Panel (80061-LIPID) TLB-TSH (Thyroid Stimulating Hormone) (95844-BNL)  Patient Instructions: 1)  Please go to the Lab in the basement for your blood and/or urine tests today  2)  Please go to Radiology in the basement level for your X-Ray today  3)  depending on results, you may need further antibiotics 4)  Please schedule an appointment with your primary doctor as needed

## 2010-12-11 NOTE — Progress Notes (Signed)
Summary: ENT Appt/Labs    Phone Note Outgoing Call   Call placed by: Vivia Ewing LPN,  July 21, 6787 3:07 PM Call placed to: Patient Summary of Call: Per Endoscopy report 07-20-10/Dr.Yashvi Jasinski- 1)Pt. will see Dr.Byers at [facility] on 07-31-10 at 3:10pm. (I have faxed pt. records over)  2) Pt. will need a CBC week of 07-23-10.  I will call pt. on Monday to give him appt. information. Initial call taken by: Vivia Ewing LPN,  July 20, 9337 3:10 PM  Follow-up for Phone Call        Message left for patient to callback. Vivia Ewing LPN  July 23, 8265 10:00 AM  Above appt. informtion reviewed w/pt. He will have labs drawn this week. Pt. instructed to call back as needed.  Follow-up by: Vivia Ewing LPN,  July 24, 6647 11:27 AM

## 2010-12-11 NOTE — Procedures (Signed)
Summary: EGD:   EGD  Procedure date:  02/14/2006  Findings:      Findings: Stricture:  Location: [facility]  Findings - Normal: Proximal Esophagus to Distal Esophagus.  Stricture in Distal Esophagus.  40 cm from mouth.  Patient Name: Labib, Cwynar MRN:  Procedure Procedures: Small Bowel Enteroscopy CPT: [phone].  Personnel: Endoscopist: Sandy Salaam. Deatra Ina, MD.  Indications  Evaluation of: Anemia,   History  Current Medications: Patient is taking a non-steroidal medication. Patient is not currently taking Coumadin. Antiplatelet: Plavix (Clopidrogel) 75 mg QD,  Allergies: Patient is allergic to iodine.  Pre-Exam Physical: Performed Feb 14, 2006  Cardio-pulmonary exam, Cardio-pulmonary exam, HEENT exam, HEENT exam, Abdominal exam, Abdominal exam, Mental status exam WNL.  Exam Exam Info: Maximum depth of insertion Jejunum, intended Jejunum. Vocal cords visualized. Gastric retroflexion performed. ASA Classification: II. Tolerance: good.  Sedation Meds: Patient assessed and found to be appropriate for moderate (conscious) sedation. Robinul 0.2 given IV. Fentanyl 50 mcg. given IV. Versed 6 mg. given IV. Cetacaine Spray 2 sprays given aerosolized.  Monitoring: BP and pulse monitoring done. Oximetry used. Supplemental O2 given at 2 Liters.  Findings - Normal: Proximal Esophagus to Distal Esophagus.  STRICTURE / STENOSIS: Stricture in Distal Esophagus.  40 cm from mouth. ICD9: Esophageal Stricture: 530.3. Comment: Schatzke's Ring. Scope easily passes through.  - Normal: Fundus to Jejunum.   Assessment Abnormal examination, see findings above.  Diagnoses: 530.3: Esophageal Stricture.   Events  Unplanned Intervention: No unplanned interventions were required.  Unplanned Events: There were no complications. Plans Patient Education: Patient given standard instructions for: Stenosis / Stricture.  Scheduling: Capsule Endoscopy, to Gatha Mayer, MD,  [facility], around Feb 19, 2006.  Office Visit, to Gatha Mayer, MD, [name], around Feb 21, 2006.   CC:   Gwendolyn Grant, MD   Silvano Rusk, MD  This report was created from the original endoscopy report, which was reviewed and signed by the above listed endoscopist.

## 2010-12-11 NOTE — Assessment & Plan Note (Signed)
Summary: chills-fever-sob---told pt side door--stc   Vital Signs:  Patient profile:   60 year old male Height:      68 inches (172.72 cm) Weight:      239.13 pounds (108.70 kg) O2 Sat:      97 % on Room air Temp:     98 degrees F (36.67 degrees C) oral Pulse rate:   88 / minute BP sitting:   112 / 62  (left arm) Cuff size:   large  Vitals Entered By: Felipa Evener (January 18, 2010 2:16 PM)  O2 Flow:  Room air   Primary Provider:  Loanne Drilling   History of Present Illness: pt states his chronic intermittent doe.  he thinks he might have fever also, as he has night sweats and chills.  no associated pain in the chest.   he has 1 month of dizziness upon bending over.   no cbg record, but states cbg's are 140-200. he also says his arthralgias are worse recently.  Current Medications (verified): 1)  Atenolol 25 Mg  Tabs (Atenolol) .... Take 1 By Mouth Qd 2)  Plavix 75 Mg  Tabs (Clopidogrel Bisulfate) .... Take 1 By Mouth Qd 3)  Lisinopril-Hydrochlorothiazide 20-12.5 Mg  Tabs (Lisinopril-Hydrochlorothiazide) .... Take 1 By Mouth Qd 4)  Aspirin 81 Mg  Tbec (Aspirin) .... Take 1 By Mouth Qg 5)  Ferrous Sulfate 324 Mg  Tabs (Ferrous Sulfate) .... Take 1 By Mouth Qd 6)  Simvastatin 80 Mg Tabs (Simvastatin) .... Take 1 By Mouth Qd 7)  Metformin Hcl 500 Mg  Tabs (Metformin Hcl) .... 2 Tab Once Daily 8)  Nexium 40 Mg  Cpdr (Esomeprazole Magnesium) .... As Needed  Allergies (verified): 1)  ! Iodine 2)  ! * Shellfish  Past History:  Past Medical History: Last updated: 07/11/2009 CORONARY ARTERY DISEASE (ICD-414.00) POSTURAL LIGHTHEADEDNESS (ICD-780.4) HYPERTENSION (ICD-401.9) HYPERLIPIDEMIA (ICD-272.4) GERD (ICD-530.81) DM (ICD-250.00) TOBACCO USER (ICD-305.1) FEVER UNSPECIFIED (ICD-780.60) ACUTE SINUSITIS, UNSPECIFIED (ICD-461.9) CELLULITIS AND ABSCESS OF UPPER ARM AND FOREARM (ICD-682.3) DIVERTICULOSIS, COLON (ICD-562.10) ANEMIA-IRON DEFICIENCY (ICD-280.9) HEMATURIA  UNSPECIFIED (ICD-599.70) BACK PAIN, LUMBAR (ICD-724.2) UNSPECIFIED DISORDER OF URETHRA&URINARY TRACT (ICD-599.9) SWEATING (ICD-780.8) ROUTINE GENERAL MEDICAL EXAM@HEALTH  CARE FACL (ICD-V70.0) SCREENING EXAMINATION UNSPEC INFECTIOUS DISEASE (ICD-V75.9) COUGH (ICD-786.2) URI (ICD-465.9)  Review of Systems  The patient denies syncope.         denies n/v/brbpr  Physical Exam  General:  obese.  no distress  Head:  head: no deformity eyes: no periorbital swelling, no proptosis external nose and ears are normal mouth: no lesion seen Lungs:  Clear to auscultation bilaterally. Normal respiratory effort.  Heart:  Regular rate and rhythm without murmurs or gallops noted. Normal S1,S2.   Abdomen:  abdomen is soft, nontender.  no hepatosplenomegaly.   not distended.  no hernia. Additional Exam:  Hemoglobin           [L]  8.3 L g/dL                  13.0-17.0 Hematocrit           [L]  25.6 L %     Hemoglobin A1C       [L]  4.4 %      Impression & Recommendations:  Problem # 1:  dyspnea prob due to #3  Problem # 2:  dizziness uncertain etiology.  could be #3, or lisinopril-hctz  Problem # 3:  ANEMIA-IRON DEFICIENCY (ICD-280.9) Assessment: Deteriorated  Problem # 4:  ARTHRALGIA (ICD-719.40) Assessment: Deteriorated  Problem # 5:  DM (  ICD-250.00) well-controlled  Medications Added to Medication List This Visit: 1)  Ferrous Sulfate 325 (65 Fe) Mg Tabs (Ferrous sulfate) .Marland Kitchen.. 1 three times a day 2)  Lisinopril-hydrochlorothiazide 10-12.5 Mg Tabs (Lisinopril-hydrochlorothiazide) .Marland Kitchen.. 1 once daily  Other Orders: TLB-BMP (Basic Metabolic Panel-BMET) (20802-MVVKPQA) TLB-CBC Platelet - w/Differential (85025-CBCD) TLB-A1C / Hgb A1C (Glycohemoglobin) (83036-A1C) TLB-Sedimentation Rate (ESR) (85652-ESR) Est. Patient Level IV (44975)  Patient Instructions: 1)  reduce lisinopril-hctz to 10/12.5, 1 per day. 2)  blood tests today. 3)  tests are being ordered for you today.  a few days  after the test(s), please call [phone] to hear your test results. 4)  physical in 3-4 months 5)  (update: i left message on phone-tree:  i tried to call you at home, but it went to ans machine.  come in for iron panel, b-12, and cbc (all 285.9)). Prescriptions: FERROUS SULFATE 325 (65 FE) MG TABS (FERROUS SULFATE) 1 three times a day  #100 x 11   Entered and Authorized by:   Donavan Foil MD   Signed by:   Donavan Foil MD on 01/18/2010   Method used:   Electronically to        [facility]. [phone]* (retail)       [address], [place]  [address]       Ph: [phone] or [phone]       Fax: [phone]   RxID:   [id number] LISINOPRIL-HYDROCHLOROTHIAZIDE 10-12.5 MG TABS (LISINOPRIL-HYDROCHLOROTHIAZIDE) 1 once daily  #30 x 11   Entered and Authorized by:   Donavan Foil MD   Signed by:   Donavan Foil MD on 01/18/2010   Method used:   Electronically to        [facility]. [phone]* (retail)       [address]       [place], [place]  [address]       Ph: [phone] or [phone]       Fax: [phone]   RxID:   [id number]

## 2010-12-11 NOTE — Assessment & Plan Note (Signed)
Summary: VIRUS/NWS   Vital Signs:  Patient Profile:   60 Years Old Male Height:     68 inches Weight:      248 pounds Temp:     97.7 degrees F oral Pulse rate:   68 / minute BP sitting:   125 / 90  (left arm) Cuff size:   large  Vitals Entered By: Tomma Lightning (January 20, 2008 1:30 PM)                 Visit Type:  Acute Visit PCP:  Loanne Drilling  Chief Complaint:  cough.  History of Present Illness: 2 mos intermittently productive cough, slight sob patient states his glucoses in the morning or in the high 100s, then the low 200s in the afternoon. Patient states he is at risk for HIV    Current Allergies: ! IODINE  Past Medical History:    Reviewed history from 06/02/2007 and no changes required:       Coronary artery disease       GERD       Hyperlipidemia       Hypertension       Smoker       Chronic sinusitis       Hyperglycemia     Review of Systems       denies otalgia.  he has some diaphoresis, but is unaware of any fever.   Physical Exam  General:     normal appearance.   Ears:     external ears are normal  Mouth:     no lesion of the oral mucosa.  no erythema.  mucous membranes are moist  Lungs:     clear to auscultation  Additional Exam:     cxr: nad to my reading    Impression & Recommendations:  Problem # 1:  COUGH (ICD-786.2) dur to acute bronchitis Orders: T-2 View CXR, Same Day (38250.5TC)   Problem # 2:  DM (ICD-250.00)  The following medications were removed from the medication list:    Metformin Hcl 500 Mg Tb24 (Metformin hcl)  His updated medication list for this problem includes:    Metformin Hcl 1000 Mg Tabs (Metformin hcl) .Marland Kitchen... Take 1 by mouth qd  Orders: TLB-A1C / Hgb A1C (Glycohemoglobin) (83036-A1C) Est. Patient Level IV (53976)   Problem # 3:  at risk for hiv  Medications Added to Medication List This Visit: 1)  Simvastatin 80 Mg Tabs (Simvastatin) .... Take 1 by mouth qd 2)  Zithromax 500 Mg Tabs  (Azithromycin) .... Qd 3)  Promethazine-codeine 6.25-10 Mg/83m Syrp (Promethazine-codeine) ..Marland Kitchen. 1 teaspoon every 4 hours as needed for cough  Other Orders: T-HIV Antibody  (Reflex) (([id number]   Patient Instructions: 1)  zithromax 500/d, x3d 2)  phenergan-codeine 8 oz 3)  check hiv 4)  check a1c    Prescriptions: ZITHROMAX 500 MG  TABS (AZITHROMYCIN) qd  #3 x 0   Entered and Authorized by:   SDonavan FoilMD   Signed by:   SDonavan FoilMD on 01/20/2008   Method used:   Print then Give to Patient   RxID:   [id number] Hill6.25-10 MG/5ML  SYRP (PROMETHAZINE-CODEINE) 1 teaspoon every 4 hours as needed for cough  #8 x 0   Entered and Authorized by:   SDonavan FoilMD   Signed by:   SDonavan FoilMD on 01/20/2008   Method used:   Print then Give to Patient   RxID::   [id number] ]

## 2010-12-11 NOTE — Assessment & Plan Note (Signed)
Summary: cough,cold/sae/cd   Vital Signs:  Patient profile:   60 year old male Weight:      240 pounds BMI:     36.62 Temp:     98.4 degrees F oral BP sitting:   102 / 62  (left arm) Cuff size:   large  Vitals Entered By: Townsend Roger, CMA (February 18, 2009 10:03 AM) CC: cough, congestion x1 mth off and on   History of Present Illness: For one month has had sinus pressure, PND, ST, and a dry cough. No fever. Currently on 3 days of Cipro from his Urologist for blood in the urine.   Allergies: 1)  ! Iodine 2)  ! * Shellfish  Past History:  Past Medical History:    Reviewed history from 11/18/2008 and no changes required:    Smoker    Chronic sinusitis    DM (ICD-250.00)    SCREENING EXAMINATION UNSPEC INFECTIOUS DISEASE (ICD-V75.9)    COUGH (ICD-786.2)    URI (ICD-465.9)    HYPERTENSION (ICD-401.9)    HYPERLIPIDEMIA (ICD-272.4)    GERD (ICD-530.81)    CORONARY ARTERY DISEASE (ICD-414.00)    Hyperlipidemia    Coronary artery disease    Anemia-iron deficiency    DJD    Diverticulosis, colon  Review of Systems  The patient denies anorexia, fever, weight loss, weight gain, vision loss, decreased hearing, hoarseness, chest pain, syncope, dyspnea on exertion, peripheral edema, hemoptysis, abdominal pain, melena, hematochezia, severe indigestion/heartburn, hematuria, incontinence, genital sores, muscle weakness, suspicious skin lesions, transient blindness, difficulty walking, depression, unusual weight change, abnormal bleeding, enlarged lymph nodes, angioedema, breast masses, and testicular masses.    Physical Exam  General:  Well-developed,well-nourished,in no acute distress; alert,appropriate and cooperative throughout examination Head:  Normocephalic and atraumatic without obvious abnormalities. No apparent alopecia or balding. Eyes:  No corneal or conjunctival inflammation noted. EOMI. Perrla. Funduscopic exam benign, without hemorrhages, exudates or papilledema. Vision  grossly normal. Ears:  External ear exam shows no significant lesions or deformities.  Otoscopic examination reveals clear canals, tympanic membranes are intact bilaterally without bulging, retraction, inflammation or discharge. Hearing is grossly normal bilaterally. Nose:  External nasal examination shows no deformity or inflammation. Nasal mucosa are pink and moist without lesions or exudates. Mouth:  Oral mucosa and oropharynx without lesions or exudates.  Teeth in good repair. Neck:  No deformities, masses, or tenderness noted. Lungs:  Normal respiratory effort, chest expands symmetrically. Lungs are clear to auscultation, no crackles or wheezes.   Impression & Recommendations:  Problem # 1:  ACUTE SINUSITIS, UNSPECIFIED (ICD-461.9)  The following medications were removed from the medication list:    Doxycycline Hyclate 100 Mg Tabs (Doxycycline hyclate) .Marland Kitchen... 1 by mouth two times a day His updated medication list for this problem includes:    Augmentin 875-125 Mg Tabs (Amoxicillin-pot clavulanate) .Marland Kitchen..Marland Kitchen Two times a day  Complete Medication List: 1)  Atenolol 25 Mg Tabs (Atenolol) .... Take 1 by mouth qd 2)  Plavix 75 Mg Tabs (Clopidogrel bisulfate) .... Take 1 by mouth qd 3)  Lisinopril-hydrochlorothiazide 20-12.5 Mg Tabs (Lisinopril-hydrochlorothiazide) .... Take 1 by mouth qd 4)  Aspirin 81 Mg Tbec (Aspirin) .... Take 1 by mouth qg 5)  Ferrous Sulfate 324 Mg Tabs (Ferrous sulfate) .... Take 1 by mouth qd 6)  Simvastatin 80 Mg Tabs (Simvastatin) .... Take 1 by mouth qd 7)  Metformin Hcl 500 Mg Tabs (Metformin hcl) .... Take 4 by mouth qd 8)  Nexium 40 Mg Cpdr (Esomeprazole magnesium) .... Qd 9)  Augmentin 875-125 Mg Tabs (Amoxicillin-pot clavulanate) .... Two times a day  Patient Instructions: 1)  Please schedule a follow-up appointment as needed .  Prescriptions: AUGMENTIN 875-125 MG TABS (AMOXICILLIN-POT CLAVULANATE) two times a day  #20 x 0   Entered and Authorized by:    Laurey Morale MD   Signed by:   Laurey Morale MD on 02/18/2009   Method used:   Electronically to        [facility]. [phone]* (retail)       [address]       [place], [place]  [address]       Ph: [phone] or [phone]       Fax: [phone]   RxID:   [id number]

## 2011-01-04 ENCOUNTER — Other Ambulatory Visit: Payer: Self-pay | Admitting: Internal Medicine

## 2011-01-04 ENCOUNTER — Other Ambulatory Visit: Payer: Self-pay

## 2011-01-04 ENCOUNTER — Encounter (INDEPENDENT_AMBULATORY_CARE_PROVIDER_SITE_OTHER): Payer: Self-pay | Admitting: *Deleted

## 2011-01-04 DIAGNOSIS — D649 Anemia, unspecified: Secondary | ICD-10-CM

## 2011-01-04 LAB — CBC WITH DIFFERENTIAL/PLATELET
Basophils Absolute: 0 10*3/uL (ref 0.0–0.1)
Basophils Relative: 0.8 % (ref 0.0–3.0)
Eosinophils Absolute: 0.1 10*3/uL (ref 0.0–0.7)
Eosinophils Relative: 2 % (ref 0.0–5.0)
HCT: 43.8 % (ref 39.0–52.0)
Hemoglobin: 15.3 g/dL (ref 13.0–17.0)
Lymphocytes Relative: 18.1 % (ref 12.0–46.0)
Lymphs Abs: 1.1 10*3/uL (ref 0.7–4.0)
MCHC: 34.8 g/dL (ref 30.0–36.0)
MCV: 91.6 fl (ref 78.0–100.0)
Monocytes Absolute: 0.7 10*3/uL (ref 0.1–1.0)
Monocytes Relative: 12.1 % — ABNORMAL HIGH (ref 3.0–12.0)
Neutro Abs: 4.1 10*3/uL (ref 1.4–7.7)
Neutrophils Relative %: 67 % (ref 43.0–77.0)
Platelets: 206 10*3/uL (ref 150.0–400.0)
RBC: 4.78 Mil/uL (ref 4.22–5.81)
RDW: 15.8 % — ABNORMAL HIGH (ref 11.5–14.6)
WBC: 6.1 10*3/uL (ref 4.5–10.5)

## 2011-01-25 LAB — BASIC METABOLIC PANEL
BUN: 18 mg/dL (ref 6–23)
CO2: 27 mEq/L (ref 19–32)
Calcium: 9.1 mg/dL (ref 8.4–10.5)
Chloride: 107 mEq/L (ref 96–112)
Creatinine, Ser: 0.94 mg/dL (ref 0.4–1.5)
GFR calc Af Amer: >60 mL/min (ref >60)
GFR calc non Af Amer: >60 mL/min (ref >60)
Glucose, Bld: 118 mg/dL — ABNORMAL HIGH (ref 70–99)
Potassium: 3.6 mEq/L (ref 3.5–5.1)
Sodium: 141 mEq/L (ref 135–145)

## 2011-01-25 LAB — CBC
HCT: 22.9 % — ABNORMAL LOW (ref 39.0–52.0)
Hemoglobin: 7.6 g/dL — ABNORMAL LOW (ref 13.0–17.0)
MCH: 33.6 pg (ref 26.0–34.0)
MCHC: 33.3 g/dL (ref 30.0–36.0)
MCV: 100.9 fL — ABNORMAL HIGH (ref 78.0–100.0)
Platelets: 300 10*3/uL (ref 150–400)
RBC: 2.27 MIL/uL — ABNORMAL LOW (ref 4.22–5.81)
RDW: 15.7 % — ABNORMAL HIGH (ref 11.5–15.5)
WBC: 7.2 10*3/uL (ref 4.0–10.5)

## 2011-01-25 LAB — CROSSMATCH
ABO/RH(D): O POS
Antibody Screen: NEGATIVE

## 2011-01-25 LAB — DIFFERENTIAL
Basophils Absolute: 0 10*3/uL (ref 0.0–0.1)
Basophils Relative: 0 % (ref 0–1)
Eosinophils Absolute: 0.1 10*3/uL (ref 0.0–0.7)
Eosinophils Relative: 1 % (ref 0–5)
Lymphocytes Relative: 12 % (ref 12–46)
Lymphs Abs: 0.9 10*3/uL (ref 0.7–4.0)
Monocytes Absolute: 0.4 10*3/uL (ref 0.1–1.0)
Monocytes Relative: 6 % (ref 3–12)
Neutro Abs: 5.8 10*3/uL (ref 1.7–7.7)
Neutrophils Relative %: 81 % — ABNORMAL HIGH (ref 43–77)

## 2011-01-25 LAB — TYPE AND SCREEN
ABO/RH(D): O POS
Antibody Screen: NEGATIVE

## 2011-01-25 LAB — HEMOCCULT GUIAC POC 1CARD (OFFICE): Fecal Occult Bld: POSITIVE

## 2011-01-25 LAB — ABO/RH: ABO/RH(D): O POS

## 2011-02-06 ENCOUNTER — Other Ambulatory Visit: Payer: Self-pay | Admitting: Endocrinology

## 2011-03-14 ENCOUNTER — Other Ambulatory Visit: Payer: Self-pay | Admitting: Endocrinology

## 2011-03-26 NOTE — Assessment & Plan Note (Signed)
[facility]                            CARDIOLOGY OFFICE NOTE   [name] [name], [name] [name]                      MRN:          [id number]  DATE:08/11/2007                            DOB:          [birth year]    [name] [name] comes in today to establish with me as his cardiologist.  He  has been a former patient of Dr. Coralie Keens.   He last saw Dr. Velora Heckler on February 05, 2006.   PROBLEM LIST:  1. History of coronary artery disease with a positive stress Myoview      in 2006 showing inferior wall ischemia.  At the time, he was having      dyspnea on exertion primarily.  Catheterization showed a total      occlusion of a small right coronary artery, and he had 80% in the      proximal LAD.  He had stenting of the LAD with a Zomax study stent.      A re-look cath in November, 2007 showed a patent stent.  2. Postural dizziness.  3. Tobacco use.  4. Hyperlipidemia.  5. Hypertension.  6. Type 2 diabetes.   He is followed by Dr. Loanne Drilling as well.   He has been having increased dyspnea on exertion.  Some of this may be  smoking, as he admits.   MEDICATIONS:  1. Atenolol 25 mg  2. Plavix 75 mg a day.  3. Metformin 1000 mg a day.  4. Aspirin 81 mg a day.  5. Nexium 40 mg a day.  6. Zocor 80 mg a day.  7. Iron sulfate 2 daily.   PHYSICAL EXAMINATION:  His blood pressure today is 130/90.  His pulse is  64 and regular.  His weight is 241.  HEENT:  Normocephalic and atraumatic.  PERRLA.  Extraocular movements  are intact.  Sclerae are clear.  Facial symmetry is normal.  His skin is  slightly pale, dry.  NECK:  Carotid upstrokes are equal bilaterally without bruits.  No JVD.  Thyroid is not enlarged.  Trachea is midline.  LUNGS:  Inspiratory/expiratory rhonchi.  HEART:  A nondisplaced PMI.  He has a normal S1 and S2 without murmur,  rub or gallop.  ABDOMEN:  Protuberant.  EXTREMITIES:  No clubbing, cyanosis or edema.  Pulses are intact.  NEUROLOGIC:  Intact.  SKIN:  Unremarkable.   EKG demonstrates sinus rhythm with no ST segment changes.   ASSESSMENT:  Dyspnea on exertion:  This could be multifactorial.  It  could just be pulmonary; however, with his coronary disease and with his  history of a positive Myoview in the setting of dyspnea on exertion in  the past, we will repeat this.  It has been several years since he has  had a surveillance study.   PLAN:  1. Exercise rest-stress Myoview off of atenolol.  2. Continue current meds.  3. Reinforce not smoking.     Thomas C. Verl Blalock, MD, [facility]  Electronically Signed    TCW/MedQ  DD: 08/11/2007  DT: 08/11/2007  Job #: [id number]

## 2011-03-29 NOTE — Discharge Summary (Signed)
NAME:  [name], [name]               ACCOUNT NO.:  [id number]   MEDICAL RECORD NO.:  [id number]          PATIENT TYPE:  OIB   LOCATION:  7619                         FACILITY:  [facility]   PHYSICIAN:  Junious Silk, M.D. LHCDATE OF BIRTH:  Oct 04, 1951   DATE OF ADMISSION:  12/18/2004  DATE OF DISCHARGE:  12/20/2004                                 DISCHARGE SUMMARY   PROCEDURES:  1.  Cardiac catheterization.  2.  Coronary arteriogram.  3.  Percutaneous transluminal coronary angioplasty and drug-eluting stent to      the left anterior descending.  4.  Intravascular ultrasound.  5.  Thrombin injection and pseudoaneurysm compression.   HOSPITAL COURSE:  [name] is a 59 year old male with no previous history  of coronary artery disease.  He had a routine stress Myoview in January 2006  and it was abnormal with hypoperfusion of the inferior wall.  There was  significant ST depression  inferiorly during the test as well.  A cardiac  catheterization was performed which showed greater than 80% stenosis in the  LAD.  [name] was referred to [facility] for percutaneous intervention and  arrived for this on December 18, 2004.   On examination, he was noted to have a large area of ecchymosis in his right  groin and there was a bruit noted which had not been present on previous  exams.  An ultrasound showed a multi-chamber pseudoaneurysm.  Thrombin  injection was tried twice and two of the compartments of the pseudoaneurysm  were thrombosed.  However, the third compartment in the neck were still  active.  Manual compression was used and pseudoaneurysm was thrombosed after  30-45 minutes of compression.  A recheck on the a.m. of December 19, 2004  showed that it remained thrombosed.   [name] was taken to the cath lab and had a drug-eluting stent placed to  the proximal LAD with intravascular ultrasound guidance under the Zomax  study.  He tolerated the procedure well and the stenosis in the  LAD was  reduced from 80% to 0 with TIMI-3 flow.  He is to continue Plavix and 325  aspirin for a year.   Post procedure [name] is not having any chest pain or shortness of  breath.  His catheterization was done from the left side and this is  angiosealed.  The Angioseal dressing is without oozing or hematoma.  Mr.  [name] [name] is to be held overnight and is tentatively considered stable for  discharge on December 20, 2004 without outpatient followup arranged.   DISCHARGE DIAGNOSES:  1.  Unstable anginal pain, status post percutaneous transluminal coronary      angioplasty and drug-eluting stent to the proximal left anterior      descending under the Zomax study.  2.  Hyperlipidemia.  3.  Pseudoaneurysm, status post thrombin injection and compression.  4.  Hypertension.  5.  Dyslipidemia.  6.  Diabetes.  7.  Tobacco use.  8.  Family history of premature coronary artery disease.   ALLERGIES:  IV CONTRAST.   DISCHARGE INSTRUCTIONS:  1.  His activity  level is to include no driving for two days.  No strenuous      activity for a week.  2.  He is to call the office for any problems with the cath site.  3.  He is to stick to a low-fat, diabetic diet.  4.  He is to follow up with Dr. Lia Foyer on February 22 at 3:15 and with Dr.      Loanne Drilling as scheduled.   DISCHARGE MEDICATIONS:  1.  Plavix 75 mg daily.  2.  Coated aspirin 325 mg daily.  3.  Lipitor 40 mg daily.  4.  Atenolol 25 mg daily.  5.  Procardia 30 mg daily.  6.  Lisinopril/HCT 20/12.5 mg two daily.  7.  Nicotine patch 14 mg daily p.r.n.  8.  Sublingual nitroglycerin p.r.n.      RB/MEDQ  D:  12/19/2004  T:  12/20/2004  Job:  [id number]   cc:   Hilliard Clark A. Loanne Drilling, M.D. [facility]   Marcello Moores D. Lia Foyer, M.D. [facility]

## 2011-03-29 NOTE — Cardiovascular Report (Signed)
NAME:  TAMARICK, KOVALCIK NO.:  [id number]   MEDICAL RECORD NO.:  [id number]          PATIENT TYPE:  OIB   LOCATION:  1962                         FACILITY:  [facility]   PHYSICIAN:  Eustace Quail, M.D. [facility] DATE OF BIRTH:  [birth year]   DATE OF PROCEDURE:  09/27/2005  DATE OF DISCHARGE:                              CARDIAC CATHETERIZATION   CLINICAL HISTORY:  Mr. Laminack is 60 years old and in February of this year  had a stent placed to the proximal LAD by Dr. Vicenta Aly as part of the Zomax  trial.  He has done quite well since that time, has had no recurrent chest  pain, and was brought back today for a follow up catheterization as part of  the Zomax protocol.   PROCEDURE:  The procedure was performed via the left femoral artery using an  arterial sheath and 6 French preformed coronary catheters.  We chose the  left femoral artery because the patient had a previous false aneurysm on the  right coronary artery following an early procedure.  We first took pictures  of the right coronary artery and then did the left ventriculogram.  We then  used a 6 [place] FL4 guiding catheter with sideholes.  The patient was given  weight adjusted heparin for an ACT greater than 200 seconds.  We passed a  Luge wire down the LAD across the stent in the proximal LAD without  difficulty.  After giving nitroglycerin, we passed the Atlantis catheter  distal to the stent and did automatic pull back.  Final diagnostic studies  were then performed through the guiding catheter.  The left femoral artery  was closed with Angioseal at the end of the procedure.  The patient  tolerated the procedure well and left the laboratory in satisfactory  condition.   RESULTS:  The aortic pressure was 100/65 with a mean of 82 and the left  ventricular pressure was 100/16.   Left main coronary artery was free of significant disease.   Left anterior descending artery gave rise to four diagonal branches and  several septal perforators.  There was 0% stenosis at the stent site in the  proximal LAD.  The mid and distal LAD were small caliber and diffusely  diseased with tandem 30% stenosis in the mid vessel.   The circumflex artery gave rise to a small ramus branch, a large marginal  branch, an atrial branch, and a posterolateral branch.  There was 40%  narrowing in the proximal marginal branch which had not changed  significantly from prior studies.   The right coronary artery is completely occluded in its mid portion.  This  was unchanged from previous studies.   The left ventriculogram performed in the RAO projection showed good wall  motion with no areas of hypokinesis.  The estimated ejection fraction was  60%.   Endovascular ultrasound of the stent in the proximal LAD showed good  expansion and good apposition of the stent with dimensions that range from  3.25 to 3.5.  The transition both at the distal edge and proximal edge of  the stent was quite  smooth.  There was no tissue in growth seen.   CONCLUSION:  Nonobstructive coronary artery disease with 0% stenosis in the  Zomax study stent in the proximal LAD, 30% stenosis in the mid left anterior  descending artery, 40% narrowing in the marginal branch of the circumflex  artery, and total occlusion of a small right coronary artery with good left  ventricular function.   RECOMMENDATIONS:  The patient has developed no restenosis at the stent site  and no significant progression of disease.  We will plan to continue  secondary risk factor modification and reassurance.  We will arrange follow  up with Dr. Velora Heckler.           ______________________________  Eustace Quail, M.D. [facility]     BB/MEDQ  D:  09/27/2005  T:  09/27/2005  Job:  [id number]   cc:   Signa Kell, M.D.  [address]  [address] [place] [address]   Sean A. Loanne Drilling, M.D. [facility]  [address]  [place] [address]

## 2011-03-29 NOTE — Op Note (Signed)
NAME:  [name] [name], [name] [name] NO.:  [id number]   MEDICAL RECORD NO.:  [id number]          PATIENT TYPE:  AMB   LOCATION:  ENDO                         FACILITY:  [facility]   PHYSICIAN:  Gatha Mayer, M.D. LHCDATE OF BIRTH:  November 14, 1950   DATE OF PROCEDURE:  02/24/2006  DATE OF DISCHARGE:  02/24/2006                                 OPERATIVE REPORT   PROCEDURE:  Small bowel capsule endoscopy.   INDICATIONS:  Anemia, hemoglobin of 5, heme-positive stools.  Prior  colonoscopy and EGD have been unrevealing.   PROCEDURE DATA:  1.  Height:  The patient was 68 inches tall.  2.  Weight:  224 pounds.  3.  Build:  Stocky.   Gastric passage time 35 minutes.  Small bowel passage time 3 hours, 44  minutes.  These are both normal.   FINDINGS:  1.  This is a completed study in which the capsule reached the colon.  2.  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 They were not      actively bleeding but certainly could be a cause for his blood loss.      There may be other AVMs as well not seen on this study.  3.  Otherwise normal examination.   RECOMMENDATIONS:  Push enteroscopy and ablation of the AVMs will be  considered.  This needs to be discussed with the patient.      Gatha Mayer, M.D. [facility]  Electronically Signed     CEG/MEDQ  D:  03/04/2006  T:  03/05/2006  Job:  [id number]

## 2011-03-29 NOTE — Cardiovascular Report (Signed)
NAME:  [name] [name], DEBOW NO.:  [id number]   MEDICAL RECORD NO.:  [id number]          PATIENT TYPE:  OIB   LOCATION:  8127                         FACILITY:  [facility]   PHYSICIAN:  Junious Silk, M.D. LHCDATE OF BIRTH:  02/06/1951   DATE OF PROCEDURE:  12/19/2004  DATE OF DISCHARGE:                              CARDIAC CATHETERIZATION   REPORT:  On [facility] medical number is [id number].   DATA:  Procedure is 2806 of present, copies to Dr. [name] with Dr.  Fara Olden of hour and the cardiac catheter.   PROCEDURE PERFORMED:  Percutaneous coronary intervention with placement of a  drug-eluting stent in the proximal left anterior descending artery.  Intravascular ultrasound was utilized. The patient was enrolled in the Zomax  II study.   INDICATIONS:  Ms. Palin is a 60 year old male who has had exertional chest  pain and dyspnea. A recent stress nuclear study showed inferior ischemia.  Cardiac catheterization performed last week revealed an 80% stenosis in the  proximal LAD and a 100% occlusion of a very small codominant right coronary  artery. The patient returned for elective intervention on the left anterior  descending artery. On arrival yesterday, he was found to have a  pseudoaneurysm of the right femoral artery at the previous access site. This  was successfully treated with a combination of thrombin injection and manual  compression. This morning, a repeat ultrasound confirmed that the  pseudoaneurysm remained thrombosed. The patient was therefore brought to the  laboratory for intervention of the left anterior descending artery.   PROCEDURE NOTE:  A 6-French sheath was placed in the left femoral artery.  Angiomax was administered per protocol. Used a 6-French COS-4 guiding  catheter. An Asahi soft coronary guidewire was advanced under fluoroscopic  guidance into the distal LAD. We then performed PTCA of the proximal LAD  with a 3.5 x 12 mm Quantum  balloon inflated to 14 atmospheres. We then  positioned a 3.0 x 18 mm Zomax study stent across the diseased segment of  vessel was deployed the stent at 9 atmospheres. Following this, we went in  with a for a 3.0 x 12 mm Quantum balloon and inflated to 14 atmospheres  distal aspect of stent and 20 atmospheres in the midportion of the stent and  22 atmospheres in the proximal portion of the stent. We then performed  intravascular ultrasound. This revealed that the distal portion of the stent  was relatively well deployed. However, the very proximal stent struts were  unopposed as the vessel became significantly larger at that point. Therefore  went back in with our 3.0 x 12 mm Quantum balloon and inflated this to 22  atmospheres in  the distal edge of the stent. We then went in with a 3.5 x  12 mm Quantum balloon and inflated this to 20 atmospheres in the proximal  edge of stent and 10 atmospheres in the midportion of the stent. We  performed a second intravascular ultrasound. This revealed improvement in  the stent appearance; however, it appeared that the very proximal strut of  the stent was  not quite fully opposed. We then went back in with our 3.5 x  12 mm Quantum balloon and inflated this to 20 atmospheres in the proximal  edge of the stent,  14 atmospheres in the midportion of the stent, and then  again to 22 atmospheres in the proximal edge of the stent. Intravascular  ultrasound was then repeated revealing that the stent did appear to be well  deployed and fully opposed that point. Intermittent doses of intracoronary  nitroglycerin and verapamil were administered. Final angiographic images  were obtained revealing patency of the LAD with 0% residual stenosis at the  stent site and TIMI III flow into the distal vessel.   At the conclusion of the procedure an AngioSeal vascular closure device was  placed in the left femoral artery with good hemostasis.   COMPLICATIONS:  None.    RESULTS:  Successful PTCA with placement of a drug-eluting stent in the  proximal left anterior descending artery. An 80% stenosis was reduced to 0%  residual with TIMI III flow.   PLAN:  Angiomax will be discontinued. The patient will be treated  combination of aspirin and Plavix for one full year. He will return in nine  months for protocol mandated coronary angiography and intravascular  ultrasound.      MWP/MEDQ  D:  12/19/2004  T:  12/19/2004  Job:  [id number]   cc:   Hilliard Clark A. Loanne Drilling, M.D. [facility]   Johnette Abraham Raymond Gurney, M.D.

## 2011-03-29 NOTE — Consult Note (Signed)
NAME:  [name] [name], [name] [name] NO.:  [id number]   MEDICAL RECORD NO.:  [id number]          PATIENT TYPE:  OIB   LOCATION:  2899                         FACILITY:  [facility]   PHYSICIAN:  Junious Silk, M.D. LHCDATE OF BIRTH:  October 21, 1951   DATE OF CONSULTATION:  12/13/2004  DATE OF DISCHARGE:                                   CONSULTATION   PROCEDURE PERFORMED:  Left heart catheterization with coronary angiography  and left ventriculography.   INDICATIONS FOR PROCEDURE:  [name] is a 60 year old male with diabetes,  hypertension, dyslipidemia, and tobacco abuse,  He has been having somewhat  atypical chest pain.  A stress nuclear scan showed evidence of an inferior  perfusion defect which was partly reversible.  He was referred for cardiac  catheterization to assess his coronary anatomy.   PROCEDURAL NOTE:  A 6 French sheath was placed in the right femoral artery.  Coronary angiography was performed using Judkins 6 French catheters.  Left  ventriculography was performed with an angled pigtail catheter.  Contrast  was Omnipaque.  There were no complications.   RESULTS:  Hemodynamics:  Left ventricular pressure 144/21, aortic pressure  160/96, there is no aortic valve gradient.   Left ventriculogram:  Wall motion is normal.  Ejection fraction estimated at  60%.  There is no mitral regurgitation.   Coronary arteriography (co-dominant):  Left main is normal.  Left anterior descending artery has a 20% stenosis in the ostium followed by  an 80% stenosis in the proximal vessel.  The mid LAD has a diffuse 20 to 30%  stenosis.  The LAD gives rise to two small diagonal branches.  Left circumflex is a large co-dominant vessel.  It gives rise to a small  ramus intermedius, a large bifurcating obtuse marginal, a large first  posterolateral branch and a small second posterolateral branch.  The first  obtuse marginal branch has a tubular 30% stenosis proximally.  Right coronary  artery is very small caliber, co-dominant vessel.  There is  100% occlusion in the mid vessel with right-to-right collaterals filling the  short segment of the mid vessel.  Following this, there was a second  occlusion of the right coronary artery at the acute margin.  The distal  right coronary artery consisting of what appears to be a small posterior  descending artery fills via left-to-right collaterals.   IMPRESSION:  1.  Preserved left ventricular systolic function.  2.  Two vessel coronary artery disease characterized by chronic total      occlusion of a very small co-dominant right coronary artery.  There is      also significant disease in the proximal left anterior descending      artery.   PLAN:  Patient will be scheduled for an elective percutaneous coronary  intervention of the left anterior descending artery.  Because the patient is  unable to stay in the hospital overnight today, we will need to schedule  this on an elective basis in the next one to two weeks.  He will be started  on Plavix therapy in addition to his ongoing  aspirin and other medical  therapy.      MWP/MEDQ  D:  12/13/2004  T:  12/13/2004  Job:  [id number]   cc:   Hilliard Clark A. Loanne Drilling, M.D. [facility]   Johnette Abraham Raymond Gurney, M.D.

## 2011-04-22 ENCOUNTER — Other Ambulatory Visit: Payer: Self-pay | Admitting: Endocrinology

## 2011-05-11 ENCOUNTER — Other Ambulatory Visit: Payer: Self-pay | Admitting: Endocrinology

## 2011-05-14 ENCOUNTER — Other Ambulatory Visit: Payer: Self-pay

## 2011-05-14 MED ORDER — ATENOLOL 25 MG PO TABS: 25.0000 mg | ORAL_TABLET | Freq: Every day | ORAL | Status: DC

## 2011-05-16 ENCOUNTER — Other Ambulatory Visit: Payer: Self-pay | Admitting: *Deleted

## 2011-05-16 ENCOUNTER — Telehealth: Payer: Self-pay

## 2011-05-16 MED ORDER — CLOPIDOGREL BISULFATE 75 MG PO TABS: 75.0000 mg | ORAL_TABLET | Freq: Every day | ORAL | Status: DC

## 2011-05-16 NOTE — Telephone Encounter (Signed)
Refill done.  

## 2011-05-16 NOTE — Telephone Encounter (Signed)
Pt requested refill for Plavix on 05/14/11 and was told that it would be phoned in to pharmacy. Pharmacy does not have request. Rx done.

## 2011-05-16 NOTE — Telephone Encounter (Signed)
Please refill x 1

## 2011-05-16 NOTE — Telephone Encounter (Signed)
[facility] Triage Call Report Triage Record Num: [id number] Operator: Vaughan Sine Patient Name: [name] [name] Call Date & Time: 05/14/2011 7:05:46PM Patient Phone: [phone] PCP: [name] Patient Gender: Male PCP Fax : [phone] Patient DOB: 1951-01-30 Practice Name: [facility] Reason for Call: Pt calling about needing Plavix refill, per Pt I left message w/ Triage RN today, 05-14-11, Pt is completely out of meds. Per Pt, "I was advised to come into the office for appt w/n next 2 mths, but was informed I would have my meds refilled until I come into office". Pt has HX of Stents. Called refill for Plavix 75m Daily #3 into [facility], [phone] per standing orders. Advised Pt to f/u w/ office once reopen after holiday. Pt verbalized understanding. Protocol(s) Used: Office Note Recommended Outcome per Protocol: Information Noted and Sent to Office Reason for Outcome: Caller information to office Care Advice: ~ 05/14/2011 7:14:16PM Page 1 of 1 [email or web address]

## 2011-05-31 ENCOUNTER — Telehealth: Payer: Self-pay | Admitting: *Deleted

## 2011-05-31 DIAGNOSIS — D649 Anemia, unspecified: Secondary | ICD-10-CM

## 2011-05-31 DIAGNOSIS — E119 Type 2 diabetes mellitus without complications: Secondary | ICD-10-CM

## 2011-05-31 NOTE — Telephone Encounter (Signed)
Request for Iron panel in labs for 07/05/11 CPE

## 2011-06-01 NOTE — Telephone Encounter (Signed)
i ordered

## 2011-06-03 NOTE — Telephone Encounter (Signed)
Pt advised that labs have been order per his request via VM.

## 2011-06-18 ENCOUNTER — Other Ambulatory Visit: Payer: Self-pay | Admitting: *Deleted

## 2011-06-18 MED ORDER — LISINOPRIL-HYDROCHLOROTHIAZIDE 10-12.5 MG PO TABS: 1.0000 | ORAL_TABLET | Freq: Every day | ORAL | Status: DC

## 2011-06-18 NOTE — Telephone Encounter (Signed)
R'cd fax from [facility] for refill of pt's Lisinopril-HCTZ  Last OV-06/15/2010

## 2011-06-26 ENCOUNTER — Encounter: Payer: Self-pay | Admitting: *Deleted

## 2011-06-28 ENCOUNTER — Ambulatory Visit: Payer: BC Managed Care – PPO

## 2011-06-28 DIAGNOSIS — Z0000 Encounter for general adult medical examination without abnormal findings: Secondary | ICD-10-CM

## 2011-06-28 DIAGNOSIS — E119 Type 2 diabetes mellitus without complications: Secondary | ICD-10-CM

## 2011-06-28 DIAGNOSIS — D649 Anemia, unspecified: Secondary | ICD-10-CM

## 2011-06-28 LAB — URINALYSIS
Bilirubin Urine: NEGATIVE
Hgb urine dipstick: NEGATIVE
Ketones, ur: NEGATIVE
Leukocytes, UA: NEGATIVE
Nitrite: NEGATIVE
Specific Gravity, Urine: 1.02 (ref 1.000–1.030)
Total Protein, Urine: NEGATIVE
Urine Glucose: NEGATIVE
Urobilinogen, UA: 0.2 (ref 0.0–1.0)
pH: 7.5 (ref 5.0–8.0)

## 2011-06-28 LAB — HEMOGLOBIN A1C: Hgb A1c MFr Bld: 6.2 % (ref 4.6–6.5)

## 2011-06-28 LAB — HEPATIC FUNCTION PANEL
ALT: 65 U/L — ABNORMAL HIGH (ref 0–53)
AST: 45 U/L — ABNORMAL HIGH (ref 0–37)
Albumin: 4.6 g/dL (ref 3.5–5.2)
Alkaline Phosphatase: 58 U/L (ref 39–117)
Bilirubin, Direct: 0.1 mg/dL (ref 0.0–0.3)
Total Bilirubin: 0.6 mg/dL (ref 0.3–1.2)
Total Protein: 7.4 g/dL (ref 6.0–8.3)

## 2011-06-28 LAB — MICROALBUMIN / CREATININE URINE RATIO
Creatinine,U: 124.8 mg/dL
Microalb Creat Ratio: 0.2 mg/g (ref 0.0–30.0)
Microalb, Ur: 0.2 mg/dL (ref 0.0–1.9)

## 2011-06-28 LAB — CBC WITH DIFFERENTIAL/PLATELET
Basophils Absolute: 0 10*3/uL (ref 0.0–0.1)
Basophils Relative: 0.7 % (ref 0.0–3.0)
Eosinophils Absolute: 0.1 10*3/uL (ref 0.0–0.7)
Eosinophils Relative: 1.2 % (ref 0.0–5.0)
HCT: 46.3 % (ref 39.0–52.0)
Hemoglobin: 15.5 g/dL (ref 13.0–17.0)
Lymphocytes Relative: 17.6 % (ref 12.0–46.0)
Lymphs Abs: 1.1 10*3/uL (ref 0.7–4.0)
MCHC: 33.5 g/dL (ref 30.0–36.0)
MCV: 94.4 fl (ref 78.0–100.0)
Monocytes Absolute: 0.6 10*3/uL (ref 0.1–1.0)
Monocytes Relative: 9.5 % (ref 3.0–12.0)
Neutro Abs: 4.5 10*3/uL (ref 1.4–7.7)
Neutrophils Relative %: 71 % (ref 43.0–77.0)
Platelets: 208 10*3/uL (ref 150.0–400.0)
RBC: 4.9 Mil/uL (ref 4.22–5.81)
RDW: 14.4 % (ref 11.5–14.6)
WBC: 6.4 10*3/uL (ref 4.5–10.5)

## 2011-06-28 LAB — BASIC METABOLIC PANEL
BUN: 14 mg/dL (ref 6–23)
CO2: 28 mEq/L (ref 19–32)
Calcium: 9.3 mg/dL (ref 8.4–10.5)
Chloride: 107 mEq/L (ref 96–112)
Creatinine, Ser: 0.8 mg/dL (ref 0.4–1.5)
GFR: 99.33 mL/min (ref >60.00)
Glucose, Bld: 90 mg/dL (ref 70–99)
Potassium: 4.6 mEq/L (ref 3.5–5.1)
Sodium: 144 mEq/L (ref 135–145)

## 2011-06-28 LAB — LIPID PANEL
Cholesterol: 148 mg/dL (ref 0–200)
HDL: 54.1 mg/dL (ref >39.00)
LDL Cholesterol: 62 mg/dL (ref 0–99)
Total CHOL/HDL Ratio: 3
Triglycerides: 159 mg/dL — ABNORMAL HIGH (ref 0.0–149.0)
VLDL: 31.8 mg/dL (ref 0.0–40.0)

## 2011-06-28 LAB — IBC PANEL
Iron: 81 ug/dL (ref 42–165)
Saturation Ratios: 18.7 % — ABNORMAL LOW (ref 20.0–50.0)
Transferrin: 309.4 mg/dL (ref 212.0–360.0)

## 2011-06-28 LAB — TSH: TSH: 1.31 u[IU]/mL (ref 0.35–5.50)

## 2011-07-05 ENCOUNTER — Encounter: Payer: Self-pay | Admitting: Endocrinology

## 2011-07-05 ENCOUNTER — Ambulatory Visit (INDEPENDENT_AMBULATORY_CARE_PROVIDER_SITE_OTHER): Payer: BC Managed Care – PPO | Admitting: Endocrinology

## 2011-07-05 ENCOUNTER — Ambulatory Visit (INDEPENDENT_AMBULATORY_CARE_PROVIDER_SITE_OTHER)
Admission: RE | Admit: 2011-07-05 | Discharge: 2011-07-05 | Disposition: A | Discharge status: Home / Self Care | Payer: BC Managed Care – PPO | Source: Ambulatory Visit | Attending: Endocrinology | Admitting: Endocrinology

## 2011-07-05 VITALS — BP 142/92 | HR 69 | Temp 98.3°F | Ht 68.0 in | Wt 237.8 lb

## 2011-07-05 DIAGNOSIS — M2550 Pain in unspecified joint: Secondary | ICD-10-CM

## 2011-07-05 DIAGNOSIS — R5383 Other fatigue: Secondary | ICD-10-CM | POA: Insufficient documentation

## 2011-07-05 DIAGNOSIS — R062 Wheezing: Secondary | ICD-10-CM

## 2011-07-05 DIAGNOSIS — K769 Liver disease, unspecified: Secondary | ICD-10-CM

## 2011-07-05 DIAGNOSIS — R5381 Other malaise: Secondary | ICD-10-CM

## 2011-07-05 LAB — PSA: PSA: 0.54 ng/mL (ref 0.10–4.00)

## 2011-07-05 MED ORDER — TRAMADOL-ACETAMINOPHEN 37.5-325 MG PO TABS: 1.0000 | ORAL_TABLET | Freq: Four times a day (QID) | ORAL | Status: AC | PRN

## 2011-07-05 MED ORDER — ATORVASTATIN CALCIUM 40 MG PO TABS: 40.0000 mg | ORAL_TABLET | Freq: Every day | ORAL | Status: DC

## 2011-07-05 NOTE — Patient Instructions (Addendum)
Go to lab in 1-2 weeks for repeat liver blood tests.  Then please call [phone] to hear your test results.  You will be prompted to enter the 9-digit "MRN" number that appears at the top left of this page, followed by #.  Then you will hear the message. If your liver is better, i will advise you to resume the metformin.   please consider these measures for your health:  minimize alcohol.  do not use tobacco products.  have a colonoscopy at least every 10 years from age 27.   keep firearms safely stored.  always use seat belts.  have working smoke alarms in your home.  see an eye doctor and dentist regularly.  never drive under the influence of alcohol or drugs (including prescription drugs).  those with fair skin should take precautions against the sun. please let me know what your wishes would be, if artificial life support measures should become necessary.  it is critically important to prevent falling down (keep floor areas well-lit, dry, and free of loose objects) i have sent a prescription to your pharmacy, for joint pain. Change simvastatin to generic lipitor 40 mg daily. A chest-x-ray is being requested for you today.  please call [phone] to hear your test results.  You will be prompted to enter the 9-digit "MRN" number that appears at the top left of this page, followed by #.  Then you will hear the message. (update: we discussed code status.  pt requests full code, but would not want to be started or maintained on artificial life-support measures if there was not a reasonable chance of recovery).  We'll follow bp for now)

## 2011-07-05 NOTE — Progress Notes (Signed)
Subjective:    Patient ID: [name] [name], male    DOB: 1951-01-23, 60 y.o.   MRN: [id number]  HPI here for regular wellness examination.  He's feeling pretty well in general, and says chronic med probs are stable, except as noted below Past Medical History  Diagnosis Date  . CORONARY ARTERY DISEASE 06/02/2007  . HYPERTENSION 06/02/2007  . HYPERLIPIDEMIA 06/02/2007  . DM 01/20/2008  . ANEMIA-IRON DEFICIENCY 11/18/2008  . ANEMIA-UNSPECIFIED 06/18/2010  . RBBB 10/12/2010  . HEMORRHOIDS-INTERNAL 06/18/2010  . GERD 06/02/2007  . DIVERTICULOSIS, COLON 11/18/2008  . RECTAL BLEEDING 06/18/2010  . ARTHRALGIA 01/18/2010  . BACK PAIN, LUMBAR 09/01/2008  . POSTURAL LIGHTHEADEDNESS 07/11/2009  . Unspecified disorder of urethra and urinary tract   . Hematuria, unspecified     Past Surgical History  Procedure Date  . Coronary angioplasty with stent placement   . Esophagogastroduodenoscopy 11/07/2005  . Stress cardiolite 11/30/2004  . Electrocardiogram 03/30/2007    History   Social History  . Marital Status: Single    Spouse Name: N/A    Number of Children: 0  . 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 Smoking status: Current Everyday Smoker -- 1.0 packs/day    Types: Cigarettes  . Smokeless tobacco: Not on file  . Alcohol Use: Yes     rare  . Drug Use: Not on file  . Sexually Active: Not on file   Other Topics Concern  . Not on file   Social History Narrative  . No narrative on file    Current Outpatient Prescriptions on File Prior to Visit  Medication Sig Dispense Refill  . aspirin 81 MG tablet Take 81 mg by mouth daily.        Marland Kitchen atenolol (TENORMIN) 25 MG tablet Take 1 tablet (25 mg total) by mouth daily.  90 tablet  0  . clopidogrel (PLAVIX) 75 MG tablet Take 1 tablet (75 mg total) by mouth daily.  30 tablet  2  . esomeprazole (NEXIUM) 40 MG capsule Take 40 mg by mouth as needed.        Marland Kitchen  lisinopril-hydrochlorothiazide (PRINZIDE,ZESTORETIC) 10-12.5 MG per tablet Take 1 tablet by mouth daily.  30 tablet  1    Allergies  Allergen Reactions  . Iodine     Family History  Problem Relation Age of Onset  . Cancer Mother     Colon Cancer-at advanced age  . Cancer Sister     Breast Cancer    BP 142/92  Pulse 69  Temp(Src) 98.3 F (36.8 C) (Oral)  Ht 5' 8"  (1.727 m)  Wt 237 lb 12.8 oz (107.865 kg)  BMI 36.16 kg/m2  SpO2 98%  Review of Systems  HENT: Negative for hearing loss.   Eyes: Negative for visual disturbance.  Cardiovascular: Negative for chest pain.  Gastrointestinal: Negative for anal bleeding.  Genitourinary: Negative for hematuria.  Musculoskeletal: Negative for myalgias.  Skin: Negative for rash.  Neurological: Negative for syncope.  Hematological: Bruises/bleeds easily.  Psychiatric/Behavioral: Negative for sleep disturbance.  pt c/o fatigue     Objective:   Physical Exam VS: see vs page GEN: no distress HEAD: head: no deformity eyes: no periorbital swelling, no proptosis external nose and ears are normal mouth: no lesion seen NECK: supple, thyroid is not enlarged CHEST WALL: no deformity BREASTS:  No gynecomastia CV: reg rate and rhythm, no murmur ABD: abdomen is  soft, nontender.  no hepatosplenomegaly.  not distended.  no hernia RECTAL: normal external and internal exam.  heme neg. PROSTATE:  Normal size.  No nodule MUSCULOSKELETAL: muscle bulk and strength are grossly normal.  no obvious joint swelling.   EXTEMITIES: no deformity.  no ulcer on the feet.  feet are of normal color and temp.  no edema PULSES: dorsalis pedis intact bilat.  no carotid bruit NEURO:  cn 2-12 grossly intact.   readily moves all 4's.  sensation is intact to touch on the feet SKIN:  Normal texture and temperature.  No rash or suspicious lesion is visible.   NODES:  None palpable at the neck. PSYCH: alert, oriented x3.  Does not appear anxious nor  depressed.      Assessment & Plan:  Wellness visit today, with problems stable, except as noted:   SEPARATE EVALUATION FOLLOWS--EACH PROBLEM HERE IS NEW, NOT RESPONDING TO TREATMENT, OR POSES SIGNIFICANT RISK TO THE PATIENT'S HEALTH: HISTORY OF THE PRESENT ILLNESS: Pt states few years of slight wheezing in the chest, and assoc prod-quality cough.  He has chronic arthralgias, worst at the right knee.   He wants to change zocor to lipitor, to minimize drug interactions elev LFT, uncertain etiology PAST MEDICAL HISTORY reviewed and up to date today REVIEW OF SYSTEMS: Denies weight change and abd pain PHYSICAL EXAMINATION: VITAL SIGNS:  See vs page GENERAL: no distress LUNGS:  Clear to auscultation GAIT: normal and steady LAB/XRAY RESULTS: i reviewed hepatic panel Cxr: nad LDL=62 i reviewed spirometry IMPRESSION: elev LFT, uncertain etiology Dyslipidemia, well-controlled, but lipitor would have fever drug intractions Restrictive lung dz, possibly due to obesity Chronic oa, pain needs increased rx PLAN: See instruction page

## 2011-07-08 ENCOUNTER — Other Ambulatory Visit: Payer: Self-pay

## 2011-07-12 ENCOUNTER — Telehealth: Payer: Self-pay | Admitting: Gastroenterology

## 2011-07-12 NOTE — Telephone Encounter (Signed)
The patient was informed per Dr. Celesta Aver orders.

## 2011-07-12 NOTE — Telephone Encounter (Signed)
Message copied by Antonieta Pert on Fri Jul 12, 2011  9:33 AM ------      Message from: Silvano Rusk E      Created: Thu Jul 11, 2011 10:11 PM      Regarding: cbc ok       Please let him know that Hgb ok again      I do not plan to keep checking that he can do through Dr. Loanne Drilling

## 2011-07-12 NOTE — Telephone Encounter (Signed)
Left a message on voicemail for the patient to return my call.

## 2011-07-12 NOTE — Telephone Encounter (Signed)
Message copied by Antonieta Pert on Fri Jul 12, 2011 12:05 PM ------      Message from: Silvano Rusk E      Created: Thu Jul 11, 2011 10:11 PM      Regarding: cbc ok       Please let him know that Hgb ok again      I do not plan to keep checking that he can do through Dr. Loanne Drilling

## 2011-07-19 ENCOUNTER — Other Ambulatory Visit (INDEPENDENT_AMBULATORY_CARE_PROVIDER_SITE_OTHER): Payer: BC Managed Care – PPO

## 2011-07-19 ENCOUNTER — Other Ambulatory Visit: Payer: Self-pay | Admitting: *Deleted

## 2011-07-19 DIAGNOSIS — K769 Liver disease, unspecified: Secondary | ICD-10-CM

## 2011-07-19 DIAGNOSIS — R5383 Other fatigue: Secondary | ICD-10-CM

## 2011-07-19 DIAGNOSIS — R5381 Other malaise: Secondary | ICD-10-CM

## 2011-07-19 DIAGNOSIS — M2550 Pain in unspecified joint: Secondary | ICD-10-CM

## 2011-07-19 LAB — URIC ACID: Uric Acid, Serum: 5 mg/dL (ref 4.0–7.8)

## 2011-07-19 LAB — HEPATIC FUNCTION PANEL
ALT: 57 U/L — ABNORMAL HIGH (ref 0–53)
AST: 36 U/L (ref 0–37)
Albumin: 4.4 g/dL (ref 3.5–5.2)
Alkaline Phosphatase: 75 U/L (ref 39–117)
Bilirubin, Direct: 0.1 mg/dL (ref 0.0–0.3)
Total Bilirubin: 0.5 mg/dL (ref 0.3–1.2)
Total Protein: 7.2 g/dL (ref 6.0–8.3)

## 2011-07-19 LAB — SEDIMENTATION RATE: Sed Rate: 8 mm/h (ref 0–22)

## 2011-07-20 LAB — HEPATITIS B SURFACE ANTIBODY,QUALITATIVE: Hep B S Ab: POSITIVE — AB

## 2011-07-20 LAB — HEPATITIS C ANTIBODY: HCV Ab: NEGATIVE

## 2011-07-20 LAB — HEPATITIS B SURFACE ANTIGEN: Hepatitis B Surface Ag: NEGATIVE

## 2011-07-22 ENCOUNTER — Telehealth: Payer: Self-pay | Admitting: Endocrinology

## 2011-07-22 DIAGNOSIS — E291 Testicular hypofunction: Secondary | ICD-10-CM

## 2011-07-22 LAB — TESTOSTERONE: Testosterone: 152.99 ng/dL — ABNORMAL LOW (ref 350.00–890.00)

## 2011-07-22 NOTE — Telephone Encounter (Signed)
i left message on phone tree i've ordered LH and prolactin Caryl Pina, please notify lab to add-on.  Call pt for re-draw if necessary

## 2011-07-23 NOTE — Telephone Encounter (Signed)
Lab add on sheet faxed to lab. They will notify me if they do not still have specimen.

## 2011-07-23 NOTE — Telephone Encounter (Signed)
Lab did not have enough specimen left to run test. Pt states that he will come back in this week to have labs drawn.

## 2011-07-25 ENCOUNTER — Other Ambulatory Visit (INDEPENDENT_AMBULATORY_CARE_PROVIDER_SITE_OTHER): Payer: BC Managed Care – PPO

## 2011-07-25 DIAGNOSIS — E291 Testicular hypofunction: Secondary | ICD-10-CM

## 2011-07-25 LAB — LUTEINIZING HORMONE: LH: 2.67 m[IU]/mL (ref 1.50–9.30)

## 2011-07-26 LAB — PROLACTIN: Prolactin: 4 ng/mL (ref 2.1–17.1)

## 2011-07-29 ENCOUNTER — Telehealth: Payer: Self-pay | Admitting: *Deleted

## 2011-07-29 DIAGNOSIS — E291 Testicular hypofunction: Secondary | ICD-10-CM

## 2011-07-29 MED ORDER — CLOMIPHENE CITRATE 50 MG PO TABS: ORAL_TABLET | ORAL | Status: DC

## 2011-07-29 NOTE — Telephone Encounter (Signed)
[facility]  It prob is not covered by ins.  It is cheapest at [facility]. Go to lab in approx 6 weeks to recheck tetosterone.

## 2011-07-29 NOTE — Telephone Encounter (Signed)
Left message for pt to callback office.  

## 2011-07-29 NOTE — Telephone Encounter (Signed)
Patient requesting testosterone replacement as suggested on results message.

## 2011-07-30 NOTE — Telephone Encounter (Addendum)
Pt  Informed of MD's advisement and to return in 6 weeks for labs.

## 2011-08-14 ENCOUNTER — Other Ambulatory Visit: Payer: Self-pay | Admitting: Endocrinology

## 2011-08-20 ENCOUNTER — Telehealth: Payer: Self-pay | Admitting: *Deleted

## 2011-08-20 NOTE — Telephone Encounter (Signed)
ok 

## 2011-08-20 NOTE — Telephone Encounter (Signed)
Left message for pt's dentist regarding MD's advisement.

## 2011-08-20 NOTE — Telephone Encounter (Signed)
Pt's dentist called regarding upcoming dental extraction surgery. She wants pt to d/c Plavix 5 days pre-op to minimize any bleeding.

## 2011-09-05 ENCOUNTER — Other Ambulatory Visit: Payer: Self-pay | Admitting: Endocrinology

## 2011-09-13 ENCOUNTER — Other Ambulatory Visit (INDEPENDENT_AMBULATORY_CARE_PROVIDER_SITE_OTHER): Payer: BC Managed Care – PPO

## 2011-09-13 DIAGNOSIS — E291 Testicular hypofunction: Secondary | ICD-10-CM

## 2011-09-13 LAB — TESTOSTERONE: Testosterone: 358.18 ng/dL (ref 350.00–890.00)

## 2011-10-25 ENCOUNTER — Encounter: Payer: Self-pay | Admitting: Endocrinology

## 2011-10-25 ENCOUNTER — Ambulatory Visit (INDEPENDENT_AMBULATORY_CARE_PROVIDER_SITE_OTHER): Payer: BC Managed Care – PPO | Admitting: Endocrinology

## 2011-10-25 ENCOUNTER — Other Ambulatory Visit (INDEPENDENT_AMBULATORY_CARE_PROVIDER_SITE_OTHER): Payer: BC Managed Care – PPO

## 2011-10-25 VITALS — BP 132/72 | HR 62 | Temp 99.1°F | Ht 68.0 in | Wt 237.0 lb

## 2011-10-25 DIAGNOSIS — K769 Liver disease, unspecified: Secondary | ICD-10-CM

## 2011-10-25 DIAGNOSIS — E119 Type 2 diabetes mellitus without complications: Secondary | ICD-10-CM

## 2011-10-25 DIAGNOSIS — E291 Testicular hypofunction: Secondary | ICD-10-CM

## 2011-10-25 DIAGNOSIS — Z23 Encounter for immunization: Secondary | ICD-10-CM

## 2011-10-25 NOTE — Progress Notes (Signed)
Subjective:    Patient ID: [name] [name], male    DOB: 1951/10/26, 60 y.o.   MRN: [id number]  HPI The state of at least three ongoing medical problems is addressed today: DM: pt says his diet is good.  elev lft:  Denies abd pain.  Htn:  He takes meds as rx'ed.  Denies edema.   Past Medical History  Diagnosis Date  . CORONARY ARTERY DISEASE 06/02/2007  . HYPERTENSION 06/02/2007  . HYPERLIPIDEMIA 06/02/2007  . DM 01/20/2008  . ANEMIA-IRON DEFICIENCY 11/18/2008  . ANEMIA-UNSPECIFIED 06/18/2010  . RBBB 10/12/2010  . HEMORRHOIDS-INTERNAL 06/18/2010  . GERD 06/02/2007  . DIVERTICULOSIS, COLON 11/18/2008  . RECTAL BLEEDING 06/18/2010  . ARTHRALGIA 01/18/2010  . BACK PAIN, LUMBAR 09/01/2008  . POSTURAL LIGHTHEADEDNESS 07/11/2009  . Unspecified disorder of urethra and urinary tract   . Hematuria, unspecified     Past Surgical History  Procedure Date  . Coronary angioplasty with stent placement   . Esophagogastroduodenoscopy 11/07/2005  . Stress cardiolite 11/30/2004  . Electrocardiogram 03/30/2007    History   Social History  . Marital Status: Single    Spouse Name: N/A    Number of Children: 0  . 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 Smoking status: Current Everyday Smoker -- 1.0 packs/day    Types: Cigarettes  . Smokeless tobacco: Not on file  . Alcohol Use: Yes     rare  . Drug Use: Not on file  . Sexually Active: Not on file   Other Topics Concern  . Not on file   Social History Narrative  . No narrative on file    Current Outpatient Prescriptions on File Prior to Visit  Medication Sig Dispense Refill  . aspirin 81 MG tablet Take 81 mg by mouth daily.        Marland Kitchen atenolol (TENORMIN) 25 MG tablet TAKE 1 TABLET BY MOUTH EVERY DAY  90 tablet  2  . atorvastatin (LIPITOR) 40 MG tablet Take 1 tablet (40 mg total) by mouth daily.  30 tablet  11  . clomiPHENE (CLOMID) 50 MG tablet 1/4 tab daily  10  tablet  2  . clopidogrel (PLAVIX) 75 MG tablet TAKE 1 TABLET BY MOUTH DAILY.  30 tablet  9  . esomeprazole (NEXIUM) 40 MG capsule Take 40 mg by mouth as needed.        Marland Kitchen lisinopril-hydrochlorothiazide (PRINZIDE,ZESTORETIC) 10-12.5 MG per tablet TAKE 1 TABLET BY MOUTH DAILY.  30 tablet  9    Allergies  Allergen Reactions  . Actos (Pioglitazone Hydrochloride)     Weight gain  . Iodine     Family History  Problem Relation Age of Onset  . Cancer Mother     Colon Cancer-at advanced age  . Cancer Sister     Breast Cancer    BP 132/72  Pulse 62  Temp(Src) 99.1 F (37.3 C) (Oral)  Ht 5' 8"  (1.727 m)  Wt 237 lb (107.502 kg)  BMI 36.04 kg/m2  SpO2 94%   Review of Systems Denies chest pain and sob.      Objective:   Physical Exam VITAL SIGNS:  See vs page GENERAL: no distress Pulses: dorsalis pedis intact bilat.   Feet: no deformity.  no ulcer on the feet.  feet are of normal color and temp.  no edema Neuro: sensation is intact to touch on the feet.  Lab Results  Component Value Date   WBC 6.4 06/28/2011   HGB 15.5 06/28/2011   HCT 46.3 06/28/2011   PLT 208.0 06/28/2011   GLUCOSE 90 06/28/2011   CHOL 148 06/28/2011   TRIG 159.0* 06/28/2011   HDL 54.10 06/28/2011   LDLCALC 62 06/28/2011   ALT 57* 07/19/2011   AST 36 07/19/2011   NA 144 06/28/2011   K 4.6 06/28/2011   CL 107 06/28/2011   CREATININE 0.8 06/28/2011   BUN 14 06/28/2011   CO2 28 06/28/2011   TSH 1.31 06/28/2011   PSA 0.54 06/28/2011   HGBA1C 6.2 06/28/2011   MICROALBUR 0.2 06/28/2011      Assessment & Plan:  DM, well-controlled HTN, well-controlled NASH, persistent

## 2011-10-25 NOTE — Patient Instructions (Addendum)
blood tests are being requested for you today.  please call [phone] to hear your test results.  You will be prompted to enter the 9-digit "MRN" number that appears at the top left of this page, followed by #.  Then you will hear the message. Based on the results, i would probably advise you to resume the metformin.   Clomid [facility]

## 2011-10-29 LAB — TESTOSTERONE: Testosterone: 382.93 ng/dL (ref 350.00–890.00)

## 2011-11-01 ENCOUNTER — Encounter: Payer: Self-pay | Admitting: Cardiology

## 2011-11-01 ENCOUNTER — Ambulatory Visit (INDEPENDENT_AMBULATORY_CARE_PROVIDER_SITE_OTHER): Payer: BC Managed Care – PPO | Admitting: Cardiology

## 2011-11-01 VITALS — BP 132/74 | HR 60 | Ht 68.0 in | Wt 235.0 lb

## 2011-11-01 DIAGNOSIS — I10 Essential (primary) hypertension: Secondary | ICD-10-CM

## 2011-11-01 DIAGNOSIS — I4510 Unspecified right bundle-branch block: Secondary | ICD-10-CM

## 2011-11-01 DIAGNOSIS — I2510 Atherosclerotic heart disease of native coronary artery without angina pectoris: Secondary | ICD-10-CM

## 2011-11-01 DIAGNOSIS — F17200 Nicotine dependence, unspecified, uncomplicated: Secondary | ICD-10-CM

## 2011-11-01 DIAGNOSIS — E119 Type 2 diabetes mellitus without complications: Secondary | ICD-10-CM

## 2011-11-01 MED ORDER — NITROGLYCERIN 0.4 MG SL SUBL: 0.4000 mg | SUBLINGUAL_TABLET | SUBLINGUAL | Status: DC | PRN

## 2011-11-01 NOTE — Progress Notes (Signed)
HPI [name] returns today for evaluation and management of his coronary disease history of a coronary stent. He also is a right bundle branch block.  He has had no angina or ischemic symptoms. He denies any palpitations, presyncope or syncope.  He continues to smoke but has cut back to less than 10 a day. He has dyspnea on exertion.  Past Medical History  Diagnosis Date  . CORONARY ARTERY DISEASE 06/02/2007  . HYPERTENSION 06/02/2007  . HYPERLIPIDEMIA 06/02/2007  . DM 01/20/2008  . ANEMIA-IRON DEFICIENCY 11/18/2008  . ANEMIA-UNSPECIFIED 06/18/2010  . RBBB 10/12/2010  . HEMORRHOIDS-INTERNAL 06/18/2010  . GERD 06/02/2007  . DIVERTICULOSIS, COLON 11/18/2008  . RECTAL BLEEDING 06/18/2010  . ARTHRALGIA 01/18/2010  . BACK PAIN, LUMBAR 09/01/2008  . POSTURAL LIGHTHEADEDNESS 07/11/2009  . Unspecified disorder of urethra and urinary tract   . Hematuria, unspecified     Current Outpatient Prescriptions  Medication Sig Dispense Refill  . aspirin 81 MG tablet Take 81 mg by mouth daily.        Marland Kitchen atenolol (TENORMIN) 25 MG tablet TAKE 1 TABLET BY MOUTH EVERY DAY  90 tablet  2  . atorvastatin (LIPITOR) 40 MG tablet Take 1 tablet (40 mg total) by mouth daily.  30 tablet  11  . clopidogrel (PLAVIX) 75 MG tablet TAKE 1 TABLET BY MOUTH DAILY.  30 tablet  9  . esomeprazole (NEXIUM) 40 MG capsule Take 40 mg by mouth as needed.        Marland Kitchen lisinopril-hydrochlorothiazide (PRINZIDE,ZESTORETIC) 10-12.5 MG per tablet TAKE 1 TABLET BY MOUTH DAILY.  30 tablet  9  . clomiPHENE (CLOMID) 50 MG tablet 1/4 tab daily  10 tablet  2  . nitroGLYCERIN (NITROSTAT) 0.4 MG SL tablet Place 1 tablet (0.4 mg total) under the tongue every 5 (five) minutes as needed for chest pain.  25 tablet  6    Allergies  Allergen Reactions  . Actos (Pioglitazone Hydrochloride)     Weight gain  . Iodine     Family History  Problem Relation Age of Onset  . Cancer Mother     Colon Cancer-at advanced age  . Cancer Sister     Breast Cancer     History   Social History  . Marital Status: Single    Spouse Name: N/A    Number of Children: 0  . 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 Smoking status: Current Some Day Smoker -- 1.0 packs/day    Types: Cigarettes  . Smokeless tobacco: Not on file  . Alcohol Use: Yes     rare  . Drug Use: Not on file  . Sexually Active: Not on file   Other Topics Concern  . Not on file   Social History Narrative  . No narrative on file    ROS ALL NEGATIVE EXCEPT THOSE NOTED IN HPI  PE  General Appearance: well developed, well nourished in no acute distressObese HEENT: symmetrical face, PERRLA, good dentition  Neck: no JVD, thyromegaly, or adenopathy, trachea midline Chest: symmetric without deformity Cardiac: PMI non-displaced, RRR, normal S1, S2, no gallop or murmur Lung: clear to ausculation and percussion Vascular: all pulses full without bruits  Abdominal: nondistended, nontender, good bowel sounds, no HSM, no bruits Extremities: no cyanosis, clubbing or edema, no sign of DVT, no varicosities  Skin: normal color, no rashes Neuro: alert and oriented x 3, non-focal Pysch:  normal affect  EKG Normal sinus rhythm, right bundle branch block BMET    Component Value Date/Time   NA 144 06/28/2011 1609   K 4.6 06/28/2011 1609   CL 107 06/28/2011 1609   CO2 28 06/28/2011 1609   GLUCOSE 90 06/28/2011 1609   BUN 14 06/28/2011 1609   CREATININE 0.8 06/28/2011 1609   CALCIUM 9.3 06/28/2011 1609   GFRNONAA >60 06/15/2010 2135   GFRAA  Value: >60        The eGFR has been calculated using the MDRD equation. This calculation has not been validated in all clinical situations. eGFR's persistently <60 mL/min signify possible Chronic Kidney Disease. 06/15/2010 2135    Lipid Panel     Component Value Date/Time   CHOL 148 06/28/2011 1609   TRIG 159.0* 06/28/2011 1609   HDL 54.10 06/28/2011 1609   CHOLHDL 3  06/28/2011 1609   VLDL 31.8 06/28/2011 1609   LDLCALC 62 06/28/2011 1609    CBC    Component Value Date/Time   WBC 6.4 06/28/2011 1609   RBC 4.90 06/28/2011 1609   HGB 15.5 06/28/2011 1609   HCT 46.3 06/28/2011 1609   PLT 208.0 06/28/2011 1609   MCV 94.4 06/28/2011 1609   MCH 33.6 06/15/2010 2135   MCHC 33.5 06/28/2011 1609   RDW 14.4 06/28/2011 1609   LYMPHSABS 1.1 06/28/2011 1609   MONOABS 0.6 06/28/2011 1609   EOSABS 0.1 06/28/2011 1609   BASOSABS 0.0 06/28/2011 1609

## 2011-11-01 NOTE — Assessment & Plan Note (Signed)
Stable. I reviewed with him the importance of caring and using several nitroglycerin if needed. Also strongly encouraged him to quit smoking.

## 2011-11-01 NOTE — Patient Instructions (Signed)
Your physician recommends that you schedule a follow-up appointment in: 1 year Your physician has recommended you make the following change in your medication: Keep Nitro on hand in case of an emergency

## 2011-11-11 ENCOUNTER — Other Ambulatory Visit: Payer: Self-pay

## 2011-11-13 MED ORDER — CLOMIPHENE CITRATE 50 MG PO TABS: ORAL_TABLET | ORAL | Status: DC

## 2011-11-13 NOTE — Telephone Encounter (Signed)
Done escript

## 2012-02-28 ENCOUNTER — Other Ambulatory Visit: Payer: Self-pay

## 2012-02-28 NOTE — Telephone Encounter (Signed)
Pt called requesting a refill of Clomid, pt says it is no longer covered under insurance plan and it would be cheaper at [facility].

## 2012-03-02 MED ORDER — CLOMIPHENE CITRATE 50 MG PO TABS: ORAL_TABLET | ORAL | Status: DC

## 2012-06-04 ENCOUNTER — Other Ambulatory Visit: Payer: Self-pay | Admitting: Endocrinology

## 2012-06-10 ENCOUNTER — Ambulatory Visit (INDEPENDENT_AMBULATORY_CARE_PROVIDER_SITE_OTHER): Payer: BC Managed Care – PPO | Admitting: Internal Medicine

## 2012-06-10 ENCOUNTER — Ambulatory Visit (INDEPENDENT_AMBULATORY_CARE_PROVIDER_SITE_OTHER)
Admission: RE | Admit: 2012-06-10 | Discharge: 2012-06-10 | Disposition: A | Discharge status: Home / Self Care | Payer: BC Managed Care – PPO | Source: Ambulatory Visit | Attending: Internal Medicine | Admitting: Internal Medicine

## 2012-06-10 ENCOUNTER — Other Ambulatory Visit (INDEPENDENT_AMBULATORY_CARE_PROVIDER_SITE_OTHER): Payer: BC Managed Care – PPO

## 2012-06-10 ENCOUNTER — Encounter: Payer: Self-pay | Admitting: Internal Medicine

## 2012-06-10 VITALS — BP 160/90 | HR 72 | Temp 97.6°F | Ht 68.0 in | Wt 240.8 lb

## 2012-06-10 DIAGNOSIS — R509 Fever, unspecified: Secondary | ICD-10-CM

## 2012-06-10 DIAGNOSIS — R61 Generalized hyperhidrosis: Secondary | ICD-10-CM

## 2012-06-10 DIAGNOSIS — R7989 Other specified abnormal findings of blood chemistry: Secondary | ICD-10-CM

## 2012-06-10 DIAGNOSIS — IMO0001 Reserved for inherently not codable concepts without codable children: Secondary | ICD-10-CM

## 2012-06-10 DIAGNOSIS — R05 Cough: Secondary | ICD-10-CM

## 2012-06-10 DIAGNOSIS — E119 Type 2 diabetes mellitus without complications: Secondary | ICD-10-CM

## 2012-06-10 DIAGNOSIS — F17200 Nicotine dependence, unspecified, uncomplicated: Secondary | ICD-10-CM

## 2012-06-10 DIAGNOSIS — R945 Abnormal results of liver function studies: Secondary | ICD-10-CM

## 2012-06-10 LAB — CBC WITH DIFFERENTIAL/PLATELET
Basophils Absolute: 0 10*3/uL (ref 0.0–0.1)
Basophils Relative: 0.3 % (ref 0.0–3.0)
Eosinophils Absolute: 0.1 10*3/uL (ref 0.0–0.7)
Eosinophils Relative: 1 % (ref 0.0–5.0)
HCT: 46 % (ref 39.0–52.0)
Hemoglobin: 15.6 g/dL (ref 13.0–17.0)
Lymphocytes Relative: 16.3 % (ref 12.0–46.0)
Lymphs Abs: 1.1 10*3/uL (ref 0.7–4.0)
MCHC: 33.9 g/dL (ref 30.0–36.0)
MCV: 95.1 fl (ref 78.0–100.0)
Monocytes Absolute: 0.6 10*3/uL (ref 0.1–1.0)
Monocytes Relative: 9.2 % (ref 3.0–12.0)
Neutro Abs: 5 10*3/uL (ref 1.4–7.7)
Neutrophils Relative %: 73.2 % (ref 43.0–77.0)
Platelets: 193 10*3/uL (ref 150.0–400.0)
RBC: 4.84 Mil/uL (ref 4.22–5.81)
RDW: 14.4 % (ref 11.5–14.6)
WBC: 6.9 10*3/uL (ref 4.5–10.5)

## 2012-06-10 LAB — HEPATIC FUNCTION PANEL
ALT: 55 U/L — ABNORMAL HIGH (ref 0–53)
AST: 55 U/L — ABNORMAL HIGH (ref 0–37)
Albumin: 4.4 g/dL (ref 3.5–5.2)
Alkaline Phosphatase: 57 U/L (ref 39–117)
Bilirubin, Direct: 0.1 mg/dL (ref 0.0–0.3)
Total Bilirubin: 0.5 mg/dL (ref 0.3–1.2)
Total Protein: 7.2 g/dL (ref 6.0–8.3)

## 2012-06-10 LAB — URINALYSIS, ROUTINE W REFLEX MICROSCOPIC
Bilirubin Urine: NEGATIVE
Hgb urine dipstick: NEGATIVE
Ketones, ur: NEGATIVE
Leukocytes, UA: NEGATIVE
Nitrite: NEGATIVE
Specific Gravity, Urine: >=1.03 (ref 1.000–1.030)
Total Protein, Urine: NEGATIVE
Urine Glucose: NEGATIVE
Urobilinogen, UA: 0.2 (ref 0.0–1.0)
pH: 5.5 (ref 5.0–8.0)

## 2012-06-10 LAB — BASIC METABOLIC PANEL
BUN: 17 mg/dL (ref 6–23)
CO2: 29 mEq/L (ref 19–32)
Calcium: 9.4 mg/dL (ref 8.4–10.5)
Chloride: 106 mEq/L (ref 96–112)
Creatinine, Ser: 0.9 mg/dL (ref 0.4–1.5)
GFR: 96.36 mL/min (ref >60.00)
Glucose, Bld: 99 mg/dL (ref 70–99)
Potassium: 4.7 mEq/L (ref 3.5–5.1)
Sodium: 143 mEq/L (ref 135–145)

## 2012-06-10 LAB — HEMOGLOBIN A1C: Hgb A1c MFr Bld: 6.6 % — ABNORMAL HIGH (ref 4.6–6.5)

## 2012-06-10 LAB — SEDIMENTATION RATE: Sed Rate: 3 mm/hr (ref 0–22)

## 2012-06-10 NOTE — Progress Notes (Signed)
Subjective:    Patient ID: [name] [name], male    DOB: 06-Jul-1951, 61 y.o.   MRN: [id number]  HPI  complains of fever associated with sweating Onset 48h ago  Past Medical History  Diagnosis Date  . CORONARY ARTERY DISEASE   . HYPERTENSION   . HYPERLIPIDEMIA   . DM   . ANEMIA-IRON DEFICIENCY   . GERD   . DIVERTICULOSIS, COLON   . BACK PAIN, LUMBAR   . Unspecified disorder of urethra and urinary tract     Review of Systems  Constitutional: Positive for diaphoresis and fatigue. Negative for chills, activity change, appetite change and unexpected weight change. Fever: ? subjective "hot eyes"   HENT: Negative for ear pain, congestion, sore throat, rhinorrhea, sneezing, mouth sores, neck pain, neck stiffness, dental problem, postnasal drip and sinus pressure.   Eyes: Negative for pain, redness, itching and visual disturbance.  Respiratory: Positive for cough (dry for years - ?ACEI). Negative for shortness of breath.   Gastrointestinal: Negative for nausea, vomiting, abdominal pain and diarrhea.  Genitourinary: Negative for dysuria, urgency, frequency, hematuria, flank pain, decreased urine volume, discharge and difficulty urinating.  Musculoskeletal: Negative for joint swelling and arthralgias.  Skin: Negative for rash and wound.  Neurological: Negative for weakness, numbness and headaches.  Hematological: Does not bruise/bleed easily.  Psychiatric/Behavioral: Negative for hallucinations and confusion.       Objective:   Physical Exam BP 160/90  Pulse 72  Temp 97.6 F (36.4 C) (Oral)  Ht 5' 8"  (1.727 m)  Wt 240 lb 12.8 oz (109.226 kg)  BMI 36.61 kg/m2  SpO2 95% Wt Readings from Last 3 Encounters:  06/10/12 240 lb 12.8 oz (109.226 kg)  11/01/11 235 lb (106.595 kg)  10/25/11 237 lb (107.502 kg)   Constitutional: he is obese, but appears well-developed and well-nourished. No distress.  HENT: Head: Normocephalic and atraumatic. No sinus tenderness. Ears: B TMs ok, no  erythema or effusion; scab on L canal. Nose: Nose normal. Mouth/Throat: Oropharynx is clear and moist. No oropharyngeal exudate.  Eyes: Conjunctivae and EOM are normal. Pupils are equal, round, and reactive to light. No scleral icterus.  Neck: Normal range of motion. Neck supple. No JVD or LAD present. No thyromegaly present.  Cardiovascular: Normal rate, regular rhythm and normal heart sounds.  No murmur heard. No BLE edema. Pulmonary/Chest: Effort normal and breath sounds normal. No respiratory distress. he has no wheezes.  Abdominal: Obese. Soft. Bowel sounds are normal. he exhibits no distension. There is no tenderness. no masses Musculoskeletal: Normal range of motion, no joint effusions. No gross deformities Neurological: he is alert and oriented to person, place, and time. No cranial nerve deficit. Coordination normal.  Skin: Skin is warm and dry. No rash noted. No erythema.  Psychiatric: he has a normal mood and affect.  behavior is normal. Judgment and thought content normal.   Lab Results  Component Value Date   WBC 6.4 06/28/2011   HGB 15.5 06/28/2011   HCT 46.3 06/28/2011   PLT 208.0 06/28/2011   GLUCOSE 90 06/28/2011   CHOL 148 06/28/2011   TRIG 159.0* 06/28/2011   HDL 54.10 06/28/2011   LDLCALC 62 06/28/2011   ALT 57* 07/19/2011   AST 36 07/19/2011   NA 144 06/28/2011   K 4.6 06/28/2011   CL 107 06/28/2011   CREATININE 0.8 06/28/2011   BUN 14 06/28/2011   CO2 28 06/28/2011   TSH 1.31 06/28/2011   PSA 0.54 06/28/2011   HGBA1C 6.2 06/28/2011  MICROALBUR 0.2 06/28/2011   Lab Results  Component Value Date   ESRSEDRATE 8 07/19/2011      Assessment & Plan:  Subjective fever - few localizing symptoms on hx or exam but reports excessive sweating x 48h (beyond usual qhs hot flash on clomid)  Check labs including UA and ESR also CXR given smoker and dry cough Hold empiric abx unless abn identified Alternate between ibuprofen and tylenol for aches, pain and fever symptoms as discussed

## 2012-06-10 NOTE — Patient Instructions (Signed)
It was good to see you today. Test(s) ordered today. Your results will be called to you after review (48-72hours after test completion). If any changes need to be made, you will be notified at that time. If you develop worsening symptoms or fever, call and we can reconsider antibiotics, but it does not appear necessary to use antibiotics at this time. Alternate between ibuprofen and tylenol for aches and fever symptoms as discussed, stay hydrated and call if worse or unimproved

## 2012-06-10 NOTE — Assessment & Plan Note (Signed)
Lab Results  Component Value Date   HGBA1C 6.2 06/28/2011   Previously on meds but stopped due to good control with diet Recheck now and follow up PCP as planned

## 2012-06-10 NOTE — Addendum Note (Signed)
Addended by: Gwendolyn Grant A on: 06/10/2012 05:11 PM   Modules accepted: Orders

## 2012-06-11 ENCOUNTER — Other Ambulatory Visit: Payer: Self-pay | Admitting: Endocrinology

## 2012-06-15 ENCOUNTER — Ambulatory Visit
Admission: RE | Admit: 2012-06-15 | Discharge: 2012-06-15 | Disposition: A | Discharge status: Home / Self Care | Payer: BC Managed Care – PPO | Source: Ambulatory Visit | Attending: Internal Medicine | Admitting: Internal Medicine

## 2012-06-15 DIAGNOSIS — R509 Fever, unspecified: Secondary | ICD-10-CM

## 2012-06-15 DIAGNOSIS — R945 Abnormal results of liver function studies: Secondary | ICD-10-CM

## 2012-06-21 ENCOUNTER — Other Ambulatory Visit: Payer: Self-pay | Admitting: Endocrinology

## 2012-07-03 ENCOUNTER — Other Ambulatory Visit: Payer: Self-pay | Admitting: Endocrinology

## 2012-08-28 ENCOUNTER — Encounter: Payer: Self-pay | Admitting: Endocrinology

## 2012-08-28 ENCOUNTER — Ambulatory Visit (INDEPENDENT_AMBULATORY_CARE_PROVIDER_SITE_OTHER): Payer: BC Managed Care – PPO | Admitting: Endocrinology

## 2012-08-28 VITALS — BP 132/86 | HR 69 | Temp 98.9°F | Wt 234.0 lb

## 2012-08-28 DIAGNOSIS — E119 Type 2 diabetes mellitus without complications: Secondary | ICD-10-CM

## 2012-08-28 DIAGNOSIS — Z23 Encounter for immunization: Secondary | ICD-10-CM

## 2012-08-28 DIAGNOSIS — I10 Essential (primary) hypertension: Secondary | ICD-10-CM

## 2012-08-28 DIAGNOSIS — Z125 Encounter for screening for malignant neoplasm of prostate: Secondary | ICD-10-CM | POA: Insufficient documentation

## 2012-08-28 DIAGNOSIS — E291 Testicular hypofunction: Secondary | ICD-10-CM

## 2012-08-28 DIAGNOSIS — E785 Hyperlipidemia, unspecified: Secondary | ICD-10-CM

## 2012-08-28 DIAGNOSIS — K7689 Other specified diseases of liver: Secondary | ICD-10-CM

## 2012-08-28 DIAGNOSIS — K7581 Nonalcoholic steatohepatitis (NASH): Secondary | ICD-10-CM | POA: Insufficient documentation

## 2012-08-28 DIAGNOSIS — D509 Iron deficiency anemia, unspecified: Secondary | ICD-10-CM

## 2012-08-28 LAB — CBC WITH DIFFERENTIAL/PLATELET
Basophils Absolute: 0.1 10*3/uL (ref 0.0–0.1)
Basophils Relative: 1 % (ref 0–1)
Eosinophils Absolute: 0.1 10*3/uL (ref 0.0–0.7)
Eosinophils Relative: 1 % (ref 0–5)
HCT: 44.9 % (ref 39.0–52.0)
Hemoglobin: 15.6 g/dL (ref 13.0–17.0)
Lymphocytes Relative: 17 % (ref 12–46)
Lymphs Abs: 1.2 10*3/uL (ref 0.7–4.0)
MCH: 32.2 pg (ref 26.0–34.0)
MCHC: 34.7 g/dL (ref 30.0–36.0)
MCV: 92.6 fL (ref 78.0–100.0)
Monocytes Absolute: 0.6 10*3/uL (ref 0.1–1.0)
Monocytes Relative: 9 % (ref 3–12)
Neutro Abs: 5.2 10*3/uL (ref 1.7–7.7)
Neutrophils Relative %: 72 % (ref 43–77)
Platelets: 213 10*3/uL (ref 150–400)
RBC: 4.85 MIL/uL (ref 4.22–5.81)
RDW: 14.2 % (ref 11.5–15.5)
WBC: 7.2 10*3/uL (ref 4.0–10.5)

## 2012-08-28 LAB — HEPATIC FUNCTION PANEL
ALT: 54 U/L — ABNORMAL HIGH (ref 0–53)
AST: 43 U/L — ABNORMAL HIGH (ref 0–37)
Albumin: 4.6 g/dL (ref 3.5–5.2)
Alkaline Phosphatase: 67 U/L (ref 39–117)
Bilirubin, Direct: 0.1 mg/dL (ref 0.0–0.3)
Indirect Bilirubin: 0.3 mg/dL (ref 0.0–0.9)
Total Bilirubin: 0.4 mg/dL (ref 0.3–1.2)
Total Protein: 6.7 g/dL (ref 6.0–8.3)

## 2012-08-28 LAB — BASIC METABOLIC PANEL
BUN: 15 mg/dL (ref 6–23)
CO2: 26 mEq/L (ref 19–32)
Calcium: 9.3 mg/dL (ref 8.4–10.5)
Chloride: 108 mEq/L (ref 96–112)
Creat: 0.77 mg/dL (ref 0.50–1.35)
Glucose, Bld: 121 mg/dL — ABNORMAL HIGH (ref 70–99)
Potassium: 4.2 mEq/L (ref 3.5–5.3)
Sodium: 142 mEq/L (ref 135–145)

## 2012-08-28 LAB — HEMOGLOBIN A1C
Hgb A1c MFr Bld: 6.3 % — ABNORMAL HIGH (ref <5.7)
Mean Plasma Glucose: 134 mg/dL — ABNORMAL HIGH (ref <117)

## 2012-08-28 LAB — IRON: Iron: 68 ug/dL (ref 42–165)

## 2012-08-28 LAB — TSH: TSH: 1.69 u[IU]/mL (ref 0.350–4.500)

## 2012-08-28 LAB — LIPID PANEL
Cholesterol: 135 mg/dL (ref 0–200)
HDL: 43 mg/dL (ref >39)
LDL Cholesterol: 76 mg/dL (ref 0–99)
Total CHOL/HDL Ratio: 3.1 Ratio
Triglycerides: 80 mg/dL (ref <150)
VLDL: 16 mg/dL (ref 0–40)

## 2012-08-28 LAB — IBC PANEL
%SAT: 19 % — ABNORMAL LOW (ref 20–55)
TIBC: 367 ug/dL (ref 215–435)
UIBC: 299 ug/dL (ref 125–400)

## 2012-08-28 NOTE — Patient Instructions (Addendum)
Please call if you want to resume the actos.  This helps clear up the liver, although weight-loss is best for this. please consider these measures for your health:  minimize alcohol.  do not use tobacco products.  have a colonoscopy at least every 10 years from age 60.  Women should have an annual mammogram from age 89.  keep firearms safely stored.  always use seat belts.  have working smoke alarms in your home.  see an eye doctor and dentist regularly.  never drive under the influence of alcohol or drugs (including prescription drugs).  those with fair skin should take precautions against the sun. blood tests are being requested for you today.  You will be contacted with results.

## 2012-08-28 NOTE — Progress Notes (Signed)
Subjective:    Patient ID: [name] [name], male    DOB: 1951/08/07, 61 y.o.   MRN: [id number]  HPI here for regular wellness examination.  He's feeling pretty well in general, and says chronic med probs are stable, except as noted below. Past Medical History  Diagnosis Date  . CORONARY ARTERY DISEASE   . HYPERTENSION   . HYPERLIPIDEMIA   . DM   . ANEMIA-IRON DEFICIENCY   . GERD   . DIVERTICULOSIS, COLON   . BACK PAIN, LUMBAR   . Unspecified disorder of urethra and urinary tract     Past Surgical History  Procedure Date  . Coronary angioplasty with stent placement   . Esophagogastroduodenoscopy 11/07/2005  . Stress cardiolite 11/30/2004  . Electrocardiogram 03/30/2007    History   Social History  . Marital Status: Single    Spouse Name: N/A    Number of Children: 0  . 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 Smoking status: Current Some Day Smoker -- 1.0 packs/day    Types: Cigarettes  . Smokeless tobacco: Not on file  . Alcohol Use: Yes     rare  . Drug Use: Not on file  . Sexually Active: Not on file   Other Topics Concern  . Not on file   Social History Narrative  . No narrative on file    Current Outpatient Prescriptions on File Prior to Visit  Medication Sig Dispense Refill  . aspirin 81 MG tablet Take 81 mg by mouth daily.        Marland Kitchen atenolol (TENORMIN) 25 MG tablet TAKE 1 TABLET BY MOUTH EVERY DAY  90 tablet  1  . atorvastatin (LIPITOR) 40 MG tablet TAKE 1 TABLET BY MOUTH EVERY DAY  30 tablet  5  . clomiPHENE (CLOMID) 50 MG tablet 1/4 tab daily  10 tablet  2  . clopidogrel (PLAVIX) 75 MG tablet TAKE 1 TABLET BY MOUTH DAILY.  30 tablet  5  . lisinopril-hydrochlorothiazide (PRINZIDE,ZESTORETIC) 10-12.5 MG per tablet TAKE 1 TABLET BY MOUTH DAILY.  30 tablet  5  . nitroGLYCERIN (NITROSTAT) 0.4 MG SL tablet Place 1 tablet (0.4 mg total) under the tongue every 5 (five) minutes  as needed for chest pain.  25 tablet  6    Allergies  Allergen Reactions  . Actos (Pioglitazone Hydrochloride)     Weight gain  . Iodine     Family History  Problem Relation Age of Onset  . Cancer Mother     Colon Cancer-at advanced age  . Cancer Sister     Breast Cancer    BP 132/86  Pulse 69  Temp 98.9 F (37.2 C) (Oral)  Wt 234 lb (106.142 kg)  SpO2 96%  Review of Systems  Constitutional: Negative for fever and unexpected weight change.  HENT: Negative for hearing loss.   Eyes: Negative for visual disturbance.  Respiratory: Negative for shortness of breath.   Cardiovascular: Negative for chest pain.  Gastrointestinal: Negative for anal bleeding.  Genitourinary: Negative for hematuria and difficulty urinating.  Musculoskeletal: Negative for back pain.  Skin: Negative for rash.  Neurological: Negative for syncope, numbness and headaches.  Hematological: Bruises/bleeds easily.  Psychiatric/Behavioral: Negative for dysphoric mood.      Objective:   Physical Exam VS: see vs page GEN: no distress HEAD: head: no deformity eyes: no periorbital swelling, no proptosis external nose and ears  are normal mouth: no lesion seen NECK: supple, thyroid is not enlarged CHEST WALL: no deformity LUNGS: clear to auscultation BREASTS:  No gynecomastia CV: reg rate and rhythm, no murmur ABD: abdomen is soft, nontender.  no hepatosplenomegaly.  not distended.  no hernia. RECTAL: normal external and internal exam.  heme neg. PROSTATE:  Normal size.  No nodule MUSCULOSKELETAL: muscle bulk and strength are grossly normal.  no obvious joint swelling.  gait is normal and steady EXTEMITIES: no deformity.  no ulcer on the feet.  feet are of normal color and temp.  no edema PULSES: dorsalis pedis intact bilat.  no carotid bruit NEURO:  cn 2-12 grossly intact.   readily moves all 4's.  sensation is intact to touch on the feet SKIN:  Normal texture and temperature.  No rash or suspicious  lesion is visible.   NODES:  None palpable at the neck PSYCH: alert, oriented x3.  Does not appear anxious nor depressed.       Assessment & Plan:  Wellness visit today, with problems stable, except as noted.

## 2012-08-29 LAB — TESTOSTERONE: Testosterone: 354.14 ng/dL (ref 300–890)

## 2012-08-29 LAB — MICROALBUMIN / CREATININE URINE RATIO
Creatinine, Urine: 172.8 mg/dL
Microalb Creat Ratio: 5.3 mg/g (ref 0.0–30.0)
Microalb, Ur: 0.92 mg/dL (ref 0.00–1.89)

## 2012-08-29 LAB — PSA: PSA: 0.69 ng/mL (ref <=4.00)

## 2012-09-17 ENCOUNTER — Encounter: Payer: Self-pay | Admitting: Cardiology

## 2012-09-17 ENCOUNTER — Ambulatory Visit (INDEPENDENT_AMBULATORY_CARE_PROVIDER_SITE_OTHER): Payer: BC Managed Care – PPO | Admitting: Cardiology

## 2012-09-17 ENCOUNTER — Other Ambulatory Visit: Payer: Self-pay | Admitting: Endocrinology

## 2012-09-17 ENCOUNTER — Encounter: Payer: Self-pay | Admitting: *Deleted

## 2012-09-17 VITALS — BP 152/84 | HR 56 | Ht 68.0 in | Wt 231.0 lb

## 2012-09-17 DIAGNOSIS — I4510 Unspecified right bundle-branch block: Secondary | ICD-10-CM

## 2012-09-17 DIAGNOSIS — F17200 Nicotine dependence, unspecified, uncomplicated: Secondary | ICD-10-CM

## 2012-09-17 DIAGNOSIS — I10 Essential (primary) hypertension: Secondary | ICD-10-CM

## 2012-09-17 DIAGNOSIS — I2510 Atherosclerotic heart disease of native coronary artery without angina pectoris: Secondary | ICD-10-CM

## 2012-09-17 DIAGNOSIS — E785 Hyperlipidemia, unspecified: Secondary | ICD-10-CM

## 2012-09-17 NOTE — Assessment & Plan Note (Signed)
Stable. Continue secondary medical therapy. I have cleared him  for right hip surgery with Dr. Percell Miller. He can stop his Plavix. Like him to continue aspirin. He will start back on Plavix after surgery. He may be on Coumadin for a short period of time. Return the office in a year.

## 2012-09-17 NOTE — Assessment & Plan Note (Signed)
Encouraged to stop.

## 2012-09-17 NOTE — Assessment & Plan Note (Signed)
Stable

## 2012-09-17 NOTE — Assessment & Plan Note (Signed)
Lipids are at goal. No change in treatment.

## 2012-09-17 NOTE — Progress Notes (Signed)
HPI [name] [name] comes in for evaluation and management his coronary artery disease. He is scheduled for right hip surgery with Dr. Percell Miller in the near future. He surgical clearance as well.  He denies any angina or chest discomfort. He said orthopnea, PND or edema. He denies any symptoms of TIAs or mini strokes. He denies claudication. He continues to smoke but has cut way back he says. He seems to be compliant with his medication. He denies any presyncope or palpitations.  Past Medical History  Diagnosis Date  . CORONARY ARTERY DISEASE   . HYPERTENSION   . HYPERLIPIDEMIA   . DM   . ANEMIA-IRON DEFICIENCY   . GERD   . DIVERTICULOSIS, COLON   . BACK PAIN, LUMBAR   . Unspecified disorder of urethra and urinary tract     Current Outpatient Prescriptions  Medication Sig Dispense Refill  . aspirin 81 MG tablet Take 81 mg by mouth daily.        Marland Kitchen atenolol (TENORMIN) 25 MG tablet TAKE 1 TABLET BY MOUTH EVERY DAY  90 tablet  1  . atorvastatin (LIPITOR) 40 MG tablet TAKE 1 TABLET BY MOUTH EVERY DAY  30 tablet  5  . clomiPHENE (CLOMID) 50 MG tablet TAKE 1/4 TABLET BY MOUTH DAILY  10 tablet  2  . clopidogrel (PLAVIX) 75 MG tablet TAKE 1 TABLET BY MOUTH DAILY.  30 tablet  5  . lisinopril-hydrochlorothiazide (PRINZIDE,ZESTORETIC) 10-12.5 MG per tablet TAKE 1 TABLET BY MOUTH DAILY.  30 tablet  5  . nitroGLYCERIN (NITROSTAT) 0.4 MG SL tablet Place 1 tablet (0.4 mg total) under the tongue every 5 (five) minutes as needed for chest pain.  25 tablet  6  . [DISCONTINUED] clomiPHENE (CLOMID) 50 MG tablet 1/4 tab daily  10 tablet  2    Allergies  Allergen Reactions  . Actos (Pioglitazone Hydrochloride)     Weight gain  . Iodine     Family History  Problem Relation Age of Onset  . Cancer Mother     Colon Cancer-at advanced age  . Cancer Sister     Breast Cancer    History   Social History  . Marital Status: Single    Spouse Name: N/A    Number of Children: 0  . 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 Smoking status: Current Some Day Smoker -- 1.0 packs/day    Types: Cigarettes  . Smokeless tobacco: Not on file  . Alcohol Use: Yes     Comment: rare  . Drug Use: Not on file  . Sexually Active: Not on file   Other Topics Concern  . Not on file   Social History Narrative  . No narrative on file    ROS ALL NEGATIVE EXCEPT THOSE NOTED IN HPI  PE  General Appearance: well developed, well nourished in no acute distress, obese HEENT: symmetrical face, PERRLA, good dentition  Neck: no JVD, thyromegaly, or adenopathy, trachea midline Chest: symmetric without deformity Cardiac: PMI non-displaced, RRR, normal S1, S2, no gallop or murmur Lung: clear to ausculation and percussion Vascular: all pulses full without bruits  Abdominal: nondistended, nontender, good bowel sounds, no HSM, no bruits Extremities: no cyanosis, clubbing or edema, no sign of DVT, no varicosities  Skin: normal color, no rashes Neuro: alert and oriented x 3, non-focal Pysch: normal affect  EKG Sinus bradycardia, right bundle branch  BMET  Component Value Date/Time   NA 142 08/28/2012 0316   K 4.2 08/28/2012 0316   CL 108 08/28/2012 0316   CO2 26 08/28/2012 0316   GLUCOSE 121* 08/28/2012 0316   BUN 15 08/28/2012 0316   CREATININE 0.77 08/28/2012 0316   CREATININE 0.9 06/10/2012 1549   CALCIUM 9.3 08/28/2012 0316   GFRNONAA >60 06/15/2010 2135   GFRAA  Value: >60        The eGFR has been calculated using the MDRD equation. This calculation has not been validated in all clinical situations. eGFR's persistently <60 mL/min signify possible Chronic Kidney Disease. 06/15/2010 2135    Lipid Panel     Component Value Date/Time   CHOL 135 08/28/2012 0316   TRIG 80 08/28/2012 0316   HDL 43 08/28/2012 0316   CHOLHDL 3.1 08/28/2012 0316   VLDL 16 08/28/2012 0316   LDLCALC 76 08/28/2012 0316    CBC     Component Value Date/Time   WBC 7.2 08/28/2012 0316   RBC 4.85 08/28/2012 0316   HGB 15.6 08/28/2012 0316   HCT 44.9 08/28/2012 0316   PLT 213 08/28/2012 0316   MCV 92.6 08/28/2012 0316   MCH 32.2 08/28/2012 0316   MCHC 34.7 08/28/2012 0316   RDW 14.2 08/28/2012 0316   LYMPHSABS 1.2 08/28/2012 0316   MONOABS 0.6 08/28/2012 0316   EOSABS 0.1 08/28/2012 0316   BASOSABS 0.1 08/28/2012 0316

## 2012-09-17 NOTE — Patient Instructions (Addendum)
  You will need to stop your Plavix 5 days before your upcoming surgical procedure. Then restart it as directed by your surgeon.  Your physician recommends that you continue on your current medications as directed. Please refer to the Current Medication list given to you today.  Your physician wants you to follow-up in: 1 year with Dr. Verl Blalock. You will receive a reminder letter in the mail two months in advance. If you don't receive a letter, please call our office to schedule the follow-up appointment.

## 2012-10-15 NOTE — H&P (Signed)
  [facility] [address], [place] [address] [phone] A Division of [facility]  [name], M.D.   Robert A. Noemi Chapel, M.D.   Faythe Casa, M.D.   Johnny Bridge, M.D.   Almedia Balls, M.D Joseph Pierini, M.D.  Gunnar Bulla, M.D.   Lanier Prude, M.D.   Verner Chol, M.D. Alyson Locket. Ricard Dillon, PA-C            Kirstin A. Shepperson, PA-C Josh Pachuta, PA-C [place], [place]   RE: R[place], [place]   [id number]      DOB: Jul 08, 1951 PROGRESS NOTE: 10-13-12 61 year old white male with right hip end stage degenerative joint disease and chronic pain. Hip symptoms are unchanged from previous visit and he wants to proceed with total hip replacement as scheduled. We received pre-op clearance. Current medications: Clopidogrel, Lisinopril, Atorvastatin, aspirin, Tramadol, Oxycodone. No known drug allergies.  Past medical/surgical history: hypertension coronary artery disease with stent dental implant kidney stones 2 blood transfusions.  Family history is positive for heart disease hypertension diabetes and cancer.  Social history: he lives alone admits smoking one pack per day x41 years admits occasional alcohol use.  Review of systems: unremarkable.  EXAMINATION: Alert and oriented x3 in no acute distress. Height 5'7" weight 230 pounds. Blood pressure 140/84. Pulse 62. Temp 97.8. Gait is antalgic. Head is normal cephalic atraumatic. PERRLA and EOMI. Neck unremarkable. Lungs CTA bilaterally. No wheezes noted. Heart regular rate and rhythm no murmurs. Abdomen round non-distended. NABS x4. Soft non-tender. Right hip painful decreased range of motion. Skin warm and dry. No increase in respiratory effort.   X-RAYS: Right hip AP lateral previously show end stage degenerative joint disease with periarticular spurs bone on bone changes.  IMPRESSION: Right hip end stage degenerative joint disease with  chronic pain.  DISPOSITION: Will proceed with right total hip replacement as scheduled. Surgical procedure along with potential rehab/recovery time discussed. All questions answered. He lives alone and will require short rehab placement.   [name], M.D.  Electronically verified by [name], M.D. DFM(JMO):kh D 10-14-12 T 10-15-12

## 2012-10-19 ENCOUNTER — Encounter (HOSPITAL_COMMUNITY)
Admission: RE | Admit: 2012-10-19 | Discharge: 2012-10-19 | Disposition: A | Discharge status: Home / Self Care | Payer: BC Managed Care – PPO | Source: Ambulatory Visit | Attending: Orthopedic Surgery | Admitting: Orthopedic Surgery

## 2012-10-19 ENCOUNTER — Encounter (HOSPITAL_COMMUNITY): Payer: Self-pay

## 2012-10-19 LAB — COMPREHENSIVE METABOLIC PANEL
ALT: 47 U/L (ref 0–53)
AST: 41 U/L — ABNORMAL HIGH (ref 0–37)
Albumin: 4.1 g/dL (ref 3.5–5.2)
Alkaline Phosphatase: 83 U/L (ref 39–117)
BUN: 20 mg/dL (ref 6–23)
CO2: 26 mEq/L (ref 19–32)
Calcium: 9.6 mg/dL (ref 8.4–10.5)
Chloride: 103 mEq/L (ref 96–112)
Creatinine, Ser: 0.93 mg/dL (ref 0.50–1.35)
GFR calc Af Amer: >90 mL/min (ref >90)
GFR calc non Af Amer: 89 mL/min — ABNORMAL LOW (ref >90)
Glucose, Bld: 200 mg/dL — ABNORMAL HIGH (ref 70–99)
Potassium: 4.7 mEq/L (ref 3.5–5.1)
Sodium: 139 mEq/L (ref 135–145)
Total Bilirubin: 0.2 mg/dL — ABNORMAL LOW (ref 0.3–1.2)
Total Protein: 7 g/dL (ref 6.0–8.3)

## 2012-10-19 LAB — URINALYSIS, ROUTINE W REFLEX MICROSCOPIC
Bilirubin Urine: NEGATIVE
Glucose, UA: >1000 mg/dL — AB
Hgb urine dipstick: NEGATIVE
Ketones, ur: NEGATIVE mg/dL
Leukocytes, UA: NEGATIVE
Nitrite: NEGATIVE
Protein, ur: NEGATIVE mg/dL
Specific Gravity, Urine: 1.033 — ABNORMAL HIGH (ref 1.005–1.030)
Urobilinogen, UA: 0.2 mg/dL (ref 0.0–1.0)
pH: 5.5 (ref 5.0–8.0)

## 2012-10-19 LAB — CBC
HCT: 47.1 % (ref 39.0–52.0)
Hemoglobin: 16 g/dL (ref 13.0–17.0)
MCH: 32.8 pg (ref 26.0–34.0)
MCHC: 34 g/dL (ref 30.0–36.0)
MCV: 96.5 fL (ref 78.0–100.0)
Platelets: 191 10*3/uL (ref 150–400)
RBC: 4.88 MIL/uL (ref 4.22–5.81)
RDW: 14.3 % (ref 11.5–15.5)
WBC: 7.1 10*3/uL (ref 4.0–10.5)

## 2012-10-19 LAB — PROTIME-INR
INR: 0.88 (ref 0.00–1.49)
Prothrombin Time: 11.9 seconds (ref 11.6–15.2)

## 2012-10-19 LAB — URINE MICROSCOPIC-ADD ON

## 2012-10-19 LAB — SURGICAL PCR SCREEN
MRSA, PCR: NEGATIVE
Staphylococcus aureus: NEGATIVE

## 2012-10-19 LAB — TYPE AND SCREEN
ABO/RH(D): O POS
Antibody Screen: NEGATIVE

## 2012-10-19 LAB — APTT: aPTT: 29 seconds (ref 24–37)

## 2012-10-19 NOTE — Progress Notes (Signed)
Cardiac release from dr Mar Daring in chart.

## 2012-10-19 NOTE — Pre-Procedure Instructions (Signed)
[place]  10/19/2012   Your procedure is scheduled on:  10/28/12  Report to [facility] at 630 AM.  Call this number if you have problems the morning of surgery: [phone]   Remember:   Do not eat food:After Midnight.     Take these medicines the morning of surgery with A SIP OF WATER: atenolol,clomid   Do not wear jewelry, make-up or nail polish.  Do not wear lotions, powders, or perfumes. You may wear deodorant.  Do not shave 48 hours prior to surgery. Men may shave face and neck.  Do not bring valuables to the hospital.  Contacts, dentures or bridgework may not be worn into surgery.  Leave suitcase in the car. After surgery it may be brought to your room.  For patients admitted to the hospital, checkout time is 11:00 AM the day of discharge.   Patients discharged the day of surgery will not be allowed to drive home.  Name and phone number of your driver: family  Special Instructions: Shower using CHG 2 nights before surgery and the night before surgery.  If you shower the day of surgery use CHG.  Use special wash - you have one bottle of CHG for all showers.  You should use approximately 1/3 of the bottle for each shower.   Please read over the following fact sheets that you were given: Pain Booklet, Coughing and Deep Breathing, Blood Transfusion Information, MRSA Information and Surgical Site Infection Prevention

## 2012-10-27 MED ORDER — CEFAZOLIN SODIUM-DEXTROSE 2-3 GM-% IV SOLR
2.0000 g | INTRAVENOUS | Status: AC
  Administered 2012-10-28: 2 g via INTRAVENOUS
  Filled 2012-10-27: qty 50

## 2012-10-27 NOTE — H&P (Signed)
  [facility] [address] [place], [place] [address] [phone] A Division of [facility]  [name], M.D.   Robert A. Noemi Chapel, M.D.   Faythe Casa, M.D.   Johnny Bridge, M.D.   Almedia Balls, M.D Joseph Pierini, M.D.  Gunnar Bulla, M.D.   Lanier Prude, M.D.   Verner Chol, M.D. Alyson Locket. Ricard Dillon, PA-C            Kirstin A. Shepperson, PA-C [place], PA-C [place], [place]   RE: Jaleil, Renwick   [id number]      DOB: 30-Mar-1951 PROGRESS NOTE: 10-13-12 61 year old white male with right hip end stage degenerative joint disease and chronic pain. Hip symptoms are unchanged from previous visit and he wants to proceed with total hip replacement as scheduled. We received pre-op clearance. Current medications: Clopidogrel, Lisinopril, Atorvastatin, aspirin, Tramadol, Oxycodone. No known drug allergies.  Past medical/surgical history: hypertension coronary artery disease with stent dental implant kidney stones 2 blood transfusions.  Family history is positive for heart disease hypertension diabetes and cancer.  Social history: he lives alone admits smoking one pack per day x41 years admits occasional alcohol use.  Review of systems: unremarkable.  EXAMINATION: Alert and oriented x3 in no acute distress. Height 5'7" weight 230 pounds. Blood pressure 140/84. Pulse 62. Temp 97.8. Gait is antalgic. Head is normal cephalic atraumatic. PERRLA and EOMI. Neck unremarkable. Lungs CTA bilaterally. No wheezes noted. Heart regular rate and rhythm no murmurs. Abdomen round non-distended. NABS x4. Soft non-tender. Right hip painful decreased range of motion. Skin warm and dry. No increase in respiratory effort.   X-RAYS: Right hip AP lateral previously show end stage degenerative joint disease with periarticular spurs bone on bone changes.  IMPRESSION: Right hip end stage degenerative joint disease with  chronic pain.  DISPOSITION: Will proceed with right total hip replacement as scheduled. Surgical procedure along with potential rehab/recovery time discussed. All questions answered. He lives alone and will require short rehab placement.   [name], M.D.  Electronically verified by [name], M.D. DFM(JMO):kh D 10-14-12

## 2012-10-28 ENCOUNTER — Encounter (HOSPITAL_COMMUNITY): Payer: Self-pay | Admitting: *Deleted

## 2012-10-28 ENCOUNTER — Encounter (HOSPITAL_COMMUNITY): Payer: Self-pay | Admitting: Anesthesiology

## 2012-10-28 ENCOUNTER — Encounter (HOSPITAL_COMMUNITY)
Admission: RE | Disposition: A | Discharge status: Skilled Nursing Facility | Payer: Self-pay | Source: Ambulatory Visit | Attending: Orthopedic Surgery

## 2012-10-28 ENCOUNTER — Ambulatory Visit (HOSPITAL_COMMUNITY): Payer: BC Managed Care – PPO | Admitting: Anesthesiology

## 2012-10-28 ENCOUNTER — Inpatient Hospital Stay (HOSPITAL_COMMUNITY)
Admission: RE | Admit: 2012-10-28 | Discharge: 2012-10-30 | DRG: 818 | Disposition: A | Discharge status: Skilled Nursing Facility | Payer: BC Managed Care – PPO | Source: Ambulatory Visit | Attending: Orthopedic Surgery | Admitting: Orthopedic Surgery

## 2012-10-28 ENCOUNTER — Ambulatory Visit (HOSPITAL_COMMUNITY): Payer: BC Managed Care – PPO

## 2012-10-28 DIAGNOSIS — G8929 Other chronic pain: Secondary | ICD-10-CM | POA: Diagnosis present

## 2012-10-28 DIAGNOSIS — F17200 Nicotine dependence, unspecified, uncomplicated: Secondary | ICD-10-CM | POA: Diagnosis present

## 2012-10-28 DIAGNOSIS — Z79899 Other long term (current) drug therapy: Secondary | ICD-10-CM

## 2012-10-28 DIAGNOSIS — K219 Gastro-esophageal reflux disease without esophagitis: Secondary | ICD-10-CM | POA: Diagnosis present

## 2012-10-28 DIAGNOSIS — E119 Type 2 diabetes mellitus without complications: Secondary | ICD-10-CM | POA: Diagnosis present

## 2012-10-28 DIAGNOSIS — I2510 Atherosclerotic heart disease of native coronary artery without angina pectoris: Secondary | ICD-10-CM | POA: Diagnosis present

## 2012-10-28 DIAGNOSIS — M1610 Unilateral primary osteoarthritis, unspecified hip: Principal | ICD-10-CM | POA: Diagnosis present

## 2012-10-28 DIAGNOSIS — Z7982 Long term (current) use of aspirin: Secondary | ICD-10-CM

## 2012-10-28 DIAGNOSIS — I10 Essential (primary) hypertension: Secondary | ICD-10-CM | POA: Diagnosis present

## 2012-10-28 DIAGNOSIS — M169 Osteoarthritis of hip, unspecified: Principal | ICD-10-CM | POA: Diagnosis present

## 2012-10-28 DIAGNOSIS — Z96649 Presence of unspecified artificial hip joint: Secondary | ICD-10-CM

## 2012-10-28 HISTORY — PX: TOTAL HIP ARTHROPLASTY: SHX124

## 2012-10-28 LAB — GLUCOSE, CAPILLARY: Glucose-Capillary: 145 mg/dL — ABNORMAL HIGH (ref 70–99)

## 2012-10-28 SURGERY — ARTHROPLASTY, HIP, TOTAL,POSTERIOR APPROACH
Anesthesia: General | Site: Hip | Laterality: Right | Wound class: Clean

## 2012-10-28 MED ORDER — SENNOSIDES-DOCUSATE SODIUM 8.6-50 MG PO TABS: 1.0000 | ORAL_TABLET | Freq: Every evening | ORAL | Status: DC | PRN

## 2012-10-28 MED ORDER — OXYCODONE HCL 5 MG PO TABS: 5.0000 mg | ORAL_TABLET | Freq: Once | ORAL | Status: DC | PRN

## 2012-10-28 MED ORDER — EPHEDRINE SULFATE 50 MG/ML IJ SOLN
INTRAMUSCULAR | Status: DC | PRN
  Administered 2012-10-28: 15 mg via INTRAVENOUS
  Administered 2012-10-28: 10 mg via INTRAVENOUS

## 2012-10-28 MED ORDER — BISACODYL 10 MG RE SUPP: 10.0000 mg | Freq: Every day | RECTAL | Status: DC | PRN

## 2012-10-28 MED ORDER — CLOMIPHENE CITRATE 50 MG PO TABS
50.0000 mg | ORAL_TABLET | Freq: Every day | ORAL | Status: DC
  Filled 2012-10-28: qty 1

## 2012-10-28 MED ORDER — ATENOLOL 25 MG PO TABS
25.0000 mg | ORAL_TABLET | Freq: Every day | ORAL | Status: DC
Start: 2012-10-28 — End: 2012-10-30
  Administered 2012-10-28 – 2012-10-29 (×2): 25 mg via ORAL
  Filled 2012-10-28 (×3): qty 1

## 2012-10-28 MED ORDER — METOCLOPRAMIDE HCL 5 MG/ML IJ SOLN
5.0000 mg | Freq: Three times a day (TID) | INTRAMUSCULAR | Status: DC | PRN
  Administered 2012-10-28: 10 mg via INTRAVENOUS
  Filled 2012-10-28: qty 2

## 2012-10-28 MED ORDER — METHOCARBAMOL 100 MG/ML IJ SOLN
500.0000 mg | Freq: Four times a day (QID) | INTRAVENOUS | Status: DC | PRN
  Administered 2012-10-28: 500 mg via INTRAVENOUS
  Filled 2012-10-28: qty 5

## 2012-10-28 MED ORDER — OXYCODONE HCL 5 MG/5ML PO SOLN: 5.0000 mg | Freq: Once | ORAL | Status: DC | PRN

## 2012-10-28 MED ORDER — WARFARIN - PHARMACIST DOSING INPATIENT
Freq: Every day | Status: DC
  Administered 2012-10-29: 18:00:00

## 2012-10-28 MED ORDER — ENOXAPARIN SODIUM 40 MG/0.4ML ~~LOC~~ SOLN
40.0000 mg | SUBCUTANEOUS | Status: DC
  Administered 2012-10-29 – 2012-10-30 (×2): 40 mg via SUBCUTANEOUS
  Filled 2012-10-28 (×3): qty 0.4

## 2012-10-28 MED ORDER — CEFAZOLIN SODIUM 1-5 GM-% IV SOLN
1.0000 g | Freq: Three times a day (TID) | INTRAVENOUS | Status: AC
  Administered 2012-10-28 (×2): 1 g via INTRAVENOUS
  Filled 2012-10-28 (×2): qty 50

## 2012-10-28 MED ORDER — MAGNESIUM 400 MG PO CAPS: 1.0000 | ORAL_CAPSULE | Freq: Every day | ORAL | Status: DC

## 2012-10-28 MED ORDER — LISINOPRIL-HYDROCHLOROTHIAZIDE 10-12.5 MG PO TABS: 1.0000 | ORAL_TABLET | Freq: Every day | ORAL | Status: DC

## 2012-10-28 MED ORDER — NONFORMULARY OR COMPOUNDED ITEM
12.5000 mg | Status: DC
  Administered 2012-10-29: 12.5 mg via ORAL
  Filled 2012-10-28: qty 1

## 2012-10-28 MED ORDER — DOCUSATE SODIUM 100 MG PO CAPS
100.0000 mg | ORAL_CAPSULE | Freq: Two times a day (BID) | ORAL | Status: DC
  Administered 2012-10-28 – 2012-10-30 (×4): 100 mg via ORAL
  Filled 2012-10-28 (×5): qty 1

## 2012-10-28 MED ORDER — HYDROMORPHONE HCL PF 1 MG/ML IJ SOLN
INTRAMUSCULAR | Status: AC
  Filled 2012-10-28: qty 1

## 2012-10-28 MED ORDER — PROPOFOL 10 MG/ML IV BOLUS
INTRAVENOUS | Status: DC | PRN
  Administered 2012-10-28: 200 mg via INTRAVENOUS

## 2012-10-28 MED ORDER — SODIUM CHLORIDE 0.9 % IV SOLN
INTRAVENOUS | Status: DC | PRN
  Administered 2012-10-28: 30 mL via INTRAMUSCULAR

## 2012-10-28 MED ORDER — WARFARIN VIDEO
Freq: Once | Status: AC
  Administered 2012-10-28: 14:00:00

## 2012-10-28 MED ORDER — MIDAZOLAM HCL 5 MG/5ML IJ SOLN
INTRAMUSCULAR | Status: DC | PRN
  Administered 2012-10-28: 2 mg via INTRAVENOUS

## 2012-10-28 MED ORDER — FENTANYL CITRATE 0.05 MG/ML IJ SOLN
INTRAMUSCULAR | Status: DC | PRN
  Administered 2012-10-28 (×6): 50 ug via INTRAVENOUS

## 2012-10-28 MED ORDER — HYDROMORPHONE HCL PF 1 MG/ML IJ SOLN
0.5000 mg | INTRAMUSCULAR | Status: DC | PRN
  Administered 2012-10-28 (×2): 0.5 mg via INTRAVENOUS
  Filled 2012-10-28 (×2): qty 1

## 2012-10-28 MED ORDER — LISINOPRIL 10 MG PO TABS
10.0000 mg | ORAL_TABLET | Freq: Every day | ORAL | Status: DC
  Administered 2012-10-28 – 2012-10-29 (×2): 10 mg via ORAL
  Filled 2012-10-28 (×3): qty 1

## 2012-10-28 MED ORDER — PHENOL 1.4 % MT LIQD: 1.0000 | OROMUCOSAL | Status: DC | PRN

## 2012-10-28 MED ORDER — SODIUM CHLORIDE 0.9 % IJ SOLN
INTRAMUSCULAR | Status: AC
  Filled 2012-10-28: qty 9

## 2012-10-28 MED ORDER — WARFARIN SODIUM 7.5 MG PO TABS
7.5000 mg | ORAL_TABLET | Freq: Once | ORAL | Status: AC
  Administered 2012-10-28: 7.5 mg via ORAL
  Filled 2012-10-28: qty 1

## 2012-10-28 MED ORDER — ONDANSETRON HCL 4 MG/2ML IJ SOLN
INTRAMUSCULAR | Status: DC | PRN
  Administered 2012-10-28: 4 mg via INTRAVENOUS

## 2012-10-28 MED ORDER — HYDROCODONE-ACETAMINOPHEN 7.5-325 MG PO TABS
1.0000 | ORAL_TABLET | ORAL | Status: DC | PRN
  Administered 2012-10-28 – 2012-10-30 (×9): 2 via ORAL
  Filled 2012-10-28 (×9): qty 2

## 2012-10-28 MED ORDER — VECURONIUM BROMIDE 10 MG IV SOLR
INTRAVENOUS | Status: DC | PRN
  Administered 2012-10-28: 8 mg via INTRAVENOUS

## 2012-10-28 MED ORDER — ACETAMINOPHEN 650 MG RE SUPP: 650.0000 mg | Freq: Four times a day (QID) | RECTAL | Status: DC | PRN

## 2012-10-28 MED ORDER — SODIUM CHLORIDE 0.9 % IR SOLN
Status: DC | PRN
  Administered 2012-10-28: 1000 mL

## 2012-10-28 MED ORDER — LACTATED RINGERS IV SOLN
INTRAVENOUS | Status: DC | PRN
  Administered 2012-10-28 (×2): via INTRAVENOUS

## 2012-10-28 MED ORDER — PROMETHAZINE HCL 25 MG/ML IJ SOLN: 6.2500 mg | INTRAMUSCULAR | Status: DC | PRN

## 2012-10-28 MED ORDER — ONDANSETRON HCL 4 MG/2ML IJ SOLN
4.0000 mg | Freq: Four times a day (QID) | INTRAMUSCULAR | Status: DC | PRN
  Administered 2012-10-28: 4 mg via INTRAVENOUS
  Filled 2012-10-28: qty 2

## 2012-10-28 MED ORDER — LIDOCAINE HCL (CARDIAC) 20 MG/ML IV SOLN
INTRAVENOUS | Status: DC | PRN
  Administered 2012-10-28: 50 mg via INTRAVENOUS

## 2012-10-28 MED ORDER — VITAMIN B-1 100 MG PO TABS
100.0000 mg | ORAL_TABLET | Freq: Every day | ORAL | Status: DC
  Administered 2012-10-29 – 2012-10-30 (×3): 100 mg via ORAL
  Filled 2012-10-28 (×3): qty 1

## 2012-10-28 MED ORDER — MENTHOL 3 MG MT LOZG: 1.0000 | LOZENGE | OROMUCOSAL | Status: DC | PRN

## 2012-10-28 MED ORDER — FENTANYL CITRATE 0.05 MG/ML IJ SOLN: 50.0000 ug | Freq: Once | INTRAMUSCULAR | Status: DC

## 2012-10-28 MED ORDER — COUMADIN BOOK
Freq: Once | Status: DC
  Filled 2012-10-28: qty 1

## 2012-10-28 MED ORDER — BUPIVACAINE HCL (PF) 0.5 % IJ SOLN
INTRAMUSCULAR | Status: AC
  Filled 2012-10-28: qty 30

## 2012-10-28 MED ORDER — ALUM & MAG HYDROXIDE-SIMETH 200-200-20 MG/5ML PO SUSP
30.0000 mL | ORAL | Status: DC | PRN
  Filled 2012-10-28: qty 30

## 2012-10-28 MED ORDER — BUPIVACAINE HCL 0.5 % IJ SOLN
INTRAMUSCULAR | Status: DC | PRN
  Administered 2012-10-28: 20 mL

## 2012-10-28 MED ORDER — ATORVASTATIN CALCIUM 40 MG PO TABS
40.0000 mg | ORAL_TABLET | Freq: Every day | ORAL | Status: DC
  Administered 2012-10-28 – 2012-10-29 (×2): 40 mg via ORAL
  Filled 2012-10-28 (×3): qty 1

## 2012-10-28 MED ORDER — HYDROCHLOROTHIAZIDE 12.5 MG PO CAPS
12.5000 mg | ORAL_CAPSULE | Freq: Every day | ORAL | Status: DC
  Administered 2012-10-28 – 2012-10-29 (×2): 12.5 mg via ORAL
  Filled 2012-10-28 (×3): qty 1

## 2012-10-28 MED ORDER — GLYCOPYRROLATE 0.2 MG/ML IJ SOLN
INTRAMUSCULAR | Status: DC | PRN
  Administered 2012-10-28: 0.6 mg via INTRAVENOUS

## 2012-10-28 MED ORDER — MIDAZOLAM HCL 2 MG/2ML IJ SOLN: 1.0000 mg | INTRAMUSCULAR | Status: DC | PRN

## 2012-10-28 MED ORDER — METHOCARBAMOL 500 MG PO TABS
500.0000 mg | ORAL_TABLET | Freq: Four times a day (QID) | ORAL | Status: DC | PRN
  Administered 2012-10-28 – 2012-10-29 (×4): 500 mg via ORAL
  Filled 2012-10-28 (×5): qty 1

## 2012-10-28 MED ORDER — CYANOCOBALAMIN 250 MCG PO TABS
250.0000 ug | ORAL_TABLET | Freq: Every day | ORAL | Status: DC
  Administered 2012-10-29 – 2012-10-30 (×2): 250 ug via ORAL
  Filled 2012-10-28 (×3): qty 1

## 2012-10-28 MED ORDER — ONDANSETRON HCL 4 MG PO TABS: 4.0000 mg | ORAL_TABLET | Freq: Four times a day (QID) | ORAL | Status: DC | PRN

## 2012-10-28 MED ORDER — VITAMIN B-12 250 MCG PO TABS: 250.0000 ug | ORAL_TABLET | Freq: Every day | ORAL | Status: DC

## 2012-10-28 MED ORDER — ACETAMINOPHEN 325 MG PO TABS: 650.0000 mg | ORAL_TABLET | Freq: Four times a day (QID) | ORAL | Status: DC | PRN

## 2012-10-28 MED ORDER — POTASSIUM CHLORIDE IN NACL 20-0.9 MEQ/L-% IV SOLN
INTRAVENOUS | Status: DC
  Administered 2012-10-28 – 2012-10-29 (×2): via INTRAVENOUS
  Filled 2012-10-28 (×7): qty 1000

## 2012-10-28 MED ORDER — HYDROMORPHONE HCL PF 1 MG/ML IJ SOLN
0.2500 mg | INTRAMUSCULAR | Status: DC | PRN
  Administered 2012-10-28 (×3): 0.5 mg via INTRAVENOUS

## 2012-10-28 MED ORDER — NITROGLYCERIN 0.4 MG SL SUBL: 0.4000 mg | SUBLINGUAL_TABLET | SUBLINGUAL | Status: DC | PRN

## 2012-10-28 MED ORDER — BUPIVACAINE LIPOSOME 1.3 % IJ SUSP
20.0000 mL | Freq: Once | INTRAMUSCULAR | Status: AC
  Administered 2012-10-28: 20 mL
  Filled 2012-10-28: qty 20

## 2012-10-28 MED ORDER — METOCLOPRAMIDE HCL 10 MG PO TABS: 5.0000 mg | ORAL_TABLET | Freq: Three times a day (TID) | ORAL | Status: DC | PRN

## 2012-10-28 MED ORDER — NEOSTIGMINE METHYLSULFATE 1 MG/ML IJ SOLN
INTRAMUSCULAR | Status: DC | PRN
  Administered 2012-10-28: 3 mg via INTRAVENOUS

## 2012-10-28 SURGICAL SUPPLY — 67 items
BLADE SAW SAG 73X25 THK (BLADE) ×1
BLADE SAW SGTL 73X25 THK (BLADE) ×1 IMPLANT
BOOTCOVER CLEANROOM LRG (PROTECTIVE WEAR) ×4 IMPLANT
BRUSH FEMORAL CANAL (MISCELLANEOUS) IMPLANT
CLOTH BEACON ORANGE TIMEOUT ST (SAFETY) ×2 IMPLANT
COVER BACK TABLE 24X17X13 BIG (DRAPES) IMPLANT
COVER SURGICAL LIGHT HANDLE (MISCELLANEOUS) ×2 IMPLANT
DRAPE INCISE IOBAN 66X45 STRL (DRAPES) IMPLANT
DRAPE ORTHO SPLIT 77X108 STRL (DRAPES) ×4
DRAPE SURG ORHT 6 SPLT 77X108 (DRAPES) ×2 IMPLANT
DRAPE U-SHAPE 47X51 STRL (DRAPES) ×2 IMPLANT
DRILL BIT 7/64X5 (BIT) ×2 IMPLANT
DRSG MEPILEX BORDER 4X12 (GAUZE/BANDAGES/DRESSINGS) ×1 IMPLANT
DRSG PAD ABDOMINAL 8X10 ST (GAUZE/BANDAGES/DRESSINGS) ×2 IMPLANT
DURAPREP 26ML APPLICATOR (WOUND CARE) ×3 IMPLANT
ELECT BLADE 6.5 EXT (BLADE) IMPLANT
ELECT CAUTERY BLADE 6.4 (BLADE) ×2 IMPLANT
ELECT REM PT RETURN 9FT ADLT (ELECTROSURGICAL) ×2
ELECTRODE REM PT RTRN 9FT ADLT (ELECTROSURGICAL) ×1 IMPLANT
EVACUATOR 1/8 PVC DRAIN (DRAIN) IMPLANT
FACESHIELD LNG OPTICON STERILE (SAFETY) ×4 IMPLANT
GAUZE XEROFORM 5X9 LF (GAUZE/BANDAGES/DRESSINGS) ×1 IMPLANT
GLOVE BIO SURGEON STRL SZ7 (GLOVE) ×2 IMPLANT
GLOVE BIO SURGEON STRL SZ8.5 (GLOVE) ×1 IMPLANT
GLOVE BIOGEL PI IND STRL 8 (GLOVE) ×1 IMPLANT
GLOVE BIOGEL PI INDICATOR 8 (GLOVE) ×2
GLOVE ORTHO TXT STRL SZ7.5 (GLOVE) ×5 IMPLANT
GLOVE SURG SS PI 8.5 STRL IVOR (GLOVE) ×1
GLOVE SURG SS PI 8.5 STRL STRW (GLOVE) IMPLANT
GOWN PREVENTION PLUS XLARGE (GOWN DISPOSABLE) ×2 IMPLANT
GOWN STRL NON-REIN LRG LVL3 (GOWN DISPOSABLE) ×1 IMPLANT
GOWN STRL REIN 2XL XLG LVL4 (GOWN DISPOSABLE) ×4 IMPLANT
HANDPIECE INTERPULSE COAX TIP (DISPOSABLE)
KIT BASIN OR (CUSTOM PROCEDURE TRAY) ×2 IMPLANT
KIT ROOM TURNOVER OR (KITS) ×2 IMPLANT
MANIFOLD NEPTUNE II (INSTRUMENTS) ×2 IMPLANT
NDL HYPO 25GX1X1/2 BEV (NEEDLE) ×1 IMPLANT
NEEDLE 22X1 1/2 (OR ONLY) (NEEDLE) ×1 IMPLANT
NEEDLE HYPO 25GX1X1/2 BEV (NEEDLE) ×2 IMPLANT
NS IRRIG 1000ML POUR BTL (IV SOLUTION) ×2 IMPLANT
PACK TOTAL JOINT (CUSTOM PROCEDURE TRAY) ×2 IMPLANT
PAD ARMBOARD 7.5X6 YLW CONV (MISCELLANEOUS) ×4 IMPLANT
PASSER SUT SWANSON 36MM LOOP (INSTRUMENTS) ×2 IMPLANT
PILLOW ABDUCTION HIP (SOFTGOODS) ×2 IMPLANT
PRESSURIZER FEMORAL UNIV (MISCELLANEOUS) IMPLANT
SET HNDPC FAN SPRY TIP SCT (DISPOSABLE) IMPLANT
SPONGE GAUZE 4X4 12PLY (GAUZE/BANDAGES/DRESSINGS) ×1 IMPLANT
SPONGE LAP 4X18 X RAY DECT (DISPOSABLE) IMPLANT
STAPLER VISISTAT 35W (STAPLE) ×2 IMPLANT
SUCTION FRAZIER TIP 10 FR DISP (SUCTIONS) ×2 IMPLANT
SUT FIBERWIRE #2 38 REV NDL BL (SUTURE) ×6
SUT VIC AB 1 CTX 36 (SUTURE) ×4
SUT VIC AB 1 CTX36XBRD ANBCTR (SUTURE) ×4 IMPLANT
SUT VIC AB 2-0 CT1 27 (SUTURE) ×6
SUT VIC AB 2-0 CT1 TAPERPNT 27 (SUTURE) IMPLANT
SUT VIC AB 2-0 SH 27 (SUTURE) ×4
SUT VIC AB 2-0 SH 27XBRD (SUTURE) ×2 IMPLANT
SUT VIC AB 3-0 SH 27 (SUTURE)
SUT VIC AB 3-0 SH 27X BRD (SUTURE) IMPLANT
SUTURE FIBERWR#2 38 REV NDL BL (SUTURE) ×3 IMPLANT
SYR 30ML SLIP (SYRINGE) ×3 IMPLANT
SYR CONTROL 10ML LL (SYRINGE) ×2 IMPLANT
TOWEL OR 17X24 6PK STRL BLUE (TOWEL DISPOSABLE) ×2 IMPLANT
TOWEL OR 17X26 10 PK STRL BLUE (TOWEL DISPOSABLE) ×2 IMPLANT
TOWER CARTRIDGE SMART MIX (DISPOSABLE) IMPLANT
TRAY FOLEY CATH 14FR (SET/KITS/TRAYS/PACK) ×2 IMPLANT
WATER STERILE IRR 1000ML POUR (IV SOLUTION) ×5 IMPLANT

## 2012-10-28 NOTE — Brief Op Note (Signed)
10/28/2012  10:42 AM  PATIENT:  [name] [name]  61 y.o. male  PRE-OPERATIVE DIAGNOSIS:  DJD RIGHT HIP  POST-OPERATIVE DIAGNOSIS:  DJD RIGHT HIP  PROCEDURE:  Procedure(s) (LRB) with comments: TOTAL HIP ARTHROPLASTY (Right)  SURGEON:  Surgeon(s) and Role:    * [name], MD - Primary  PHYSICIAN ASSISTANT: Benjiman Core M    ANESTHESIA:   general  EBL:  Total I/O In: 1500 [I.V.:1500] Out: 500 [Urine:200; Blood:300]  BLOOD ADMINISTERED:none  LOCAL MEDICATIONS USED:  Exparel/injectable NS 50cc total fascia and SubQ                                                      Marcaine 0.5 % 10cc subq   SPECIMEN:  No Specimen  DISPOSITION OF SPECIMEN:  N/A  COUNTS:  YES  TOURNIQUET:  * No tourniquets in log *  PATIENT DISPOSITION:  PACU - hemodynamically stable.

## 2012-10-28 NOTE — Progress Notes (Signed)
UR completed 

## 2012-10-28 NOTE — Transfer of Care (Signed)
Immediate Anesthesia Transfer of Care Note  Patient: [name] [name]  Procedure(s) Performed: Procedure(s) (LRB) with comments: TOTAL HIP ARTHROPLASTY (Right)  Patient Location: PACU  Anesthesia Type:General  Level of Consciousness: awake, alert  and oriented  Airway & Oxygen Therapy: Patient Spontanous Breathing and Patient connected to nasal cannula oxygen  Post-op Assessment: Report given to PACU RN and Post -op Vital signs reviewed and stable  Post vital signs: Reviewed and stable  Complications: No apparent anesthesia complications

## 2012-10-28 NOTE — Progress Notes (Signed)
PHARMACIST - PHYSICIAN ORDER COMMUNICATION  CONCERNING: P&T Medication Policy on Herbal Medications/Supplements  DESCRIPTION:  This patient's order for:  Magnesium caps  has been noted.  This product(s) is classified as an "herbal" or natural product. Due to a lack of definitive safety studies or FDA approval, nonstandard manufacturing practices, plus the potential risk of unknown drug-drug interactions while on inpatient medications, the Pharmacy and Therapeutics Committee does not permit the use of "herbal" or natural products of this type within [facility].   ACTION TAKEN: The pharmacy department is unable to verify this order at this time and your patient has been informed of this safety policy. Please reevaluate patient's clinical condition at discharge and address if the herbal or natural product(s) should be resumed at that time.    Thanks.  Jerrye Beavers, PharmD, BCPS Clinical Pharmacist  Pager: [phone]

## 2012-10-28 NOTE — Preoperative (Signed)
Beta Blockers   Reason not to administer Beta Blockers:Not Applicable 

## 2012-10-28 NOTE — Progress Notes (Signed)
ANTICOAGULATION CONSULT NOTE - Initial Consult  Pharmacy Consult for Coumadin  Indication: VTE prophylaxis  Allergies  Allergen Reactions  . Actos (Pioglitazone Hydrochloride)     Weight gain   Patient Measurements: Height: 5' 8"  (172.7 cm) Weight: 227 lb (102.967 kg) IBW/kg (Calculated) : 68.4   Vital Signs: Temp: 97.5 F (36.4 C) (12/18 1153) Temp src: Oral (12/18 0700) BP: 134/63 mmHg (12/18 1145) Pulse Rate: 58  (12/18 1153)  Estimated Creatinine Clearance: 98.2 ml/min (by C-G formula based on Cr of 0.93).  Medical History: Past Medical History  Diagnosis Date  . CORONARY ARTERY DISEASE   . HYPERLIPIDEMIA   . DM   . ANEMIA-IRON DEFICIENCY   . GERD   . DIVERTICULOSIS, COLON   . BACK PAIN, LUMBAR   . Unspecified disorder of urethra and urinary tract   . HYPERTENSION     dr tom wall   Assessment: 61yoM being started on Coumadin for VTE prophylaxis s/p total right hip arthroplasty. Baseline INR 0.88, preop Hgb 16.0. Patient will also managed on SQ enoxaparin (to start 12-18 hours post-op) until INR >1.8.   Goal of Therapy:  INR 2-3 Monitor platelets by anticoagulation protocol: Yes   Plan:  1) Coumadin 7.77m PO x 1 2) Follow-up INR in AM 3) Monitor signs/symptoms bleeding 4) Coumadin education -- book & video 5) DC lovenox when INR > 1.8   KWoodroe Chen PharmD    10/28/2012   1:13 PM

## 2012-10-28 NOTE — Interval H&P Note (Signed)
History and Physical Interval Note:  10/28/2012 8:26 AM  [name] [name]  has presented today for surgery, with the diagnosis of DJD RIGHT HIP  The various methods of treatment have been discussed with the patient and family. After consideration of risks, benefits and other options for treatment, the patient has consented to  Procedure(s) (LRB) with comments: TOTAL HIP ARTHROPLASTY (Right) as a surgical intervention .  The patient's history has been reviewed, patient examined, no change in status, stable for surgery.  I have reviewed the patient's chart and labs.  Questions were answered to the patient's satisfaction.     [name]

## 2012-10-28 NOTE — Anesthesia Procedure Notes (Signed)
Procedure Name: Intubation Date/Time: 10/28/2012 8:38 AM Performed by: Neldon Newport Pre-anesthesia Checklist: Patient identified, Timeout performed, Emergency Drugs available, Suction available and Patient being monitored Patient Re-evaluated:Patient Re-evaluated prior to inductionOxygen Delivery Method: Circle system utilized Preoxygenation: Pre-oxygenation with 100% oxygen Intubation Type: IV induction and Cricoid Pressure applied Ventilation: Mask ventilation without difficulty Laryngoscope Size: Mac and 4 Grade View: Grade II Tube type: Oral Tube size: 8.0 mm Number of attempts: 1 Placement Confirmation: positive ETCO2,  breath sounds checked- equal and bilateral and ETT inserted through vocal cords under direct vision Secured at: 23 cm Tube secured with: Tape Dental Injury: Teeth and Oropharynx as per pre-operative assessment

## 2012-10-28 NOTE — Anesthesia Postprocedure Evaluation (Signed)
  Anesthesia Post-op Note  Patient: [name] [name]  Procedure(s) Performed: Procedure(s) (LRB) with comments: TOTAL HIP ARTHROPLASTY (Right)  Patient Location: PACU  Anesthesia Type:General  Level of Consciousness: awake  Airway and Oxygen Therapy: Patient Spontanous Breathing  Post-op Pain: mild  Post-op Assessment: Post-op Vital signs reviewed, Patient's Cardiovascular Status Stable, Respiratory Function Stable, Patent Airway, No signs of Nausea or vomiting and Pain level controlled  Post-op Vital Signs: stable  Complications: No apparent anesthesia complications

## 2012-10-28 NOTE — Anesthesia Preprocedure Evaluation (Addendum)
Anesthesia Evaluation  Patient identified by MRN, date of birth, ID band Patient awake    Reviewed: Allergy & Precautions, H&P , NPO status , Patient's Chart, lab work & pertinent test results  Airway Mallampati: I TM Distance: >3 FB Neck ROM: Full    Dental  (+) Dental Advidsory Given and Teeth Intact   Pulmonary  + rhonchi         Cardiovascular hypertension, + CAD Rhythm:Regular Rate:Normal     Neuro/Psych    GI/Hepatic GERD-  ,(+) Hepatitis -  Endo/Other  diabetes  Renal/GU      Musculoskeletal   Abdominal   Peds  Hematology   Anesthesia Other Findings   Reproductive/Obstetrics                          Anesthesia Physical Anesthesia Plan  ASA: III  Anesthesia Plan: General   Post-op Pain Management:    Induction: Intravenous  Airway Management Planned: Oral ETT  Additional Equipment:   Intra-op Plan:   Post-operative Plan: Extubation in OR  Informed Consent: I have reviewed the patients History and Physical, chart, labs and discussed the procedure including the risks, benefits and alternatives for the proposed anesthesia with the patient or authorized representative who has indicated his/her understanding and acceptance.   Dental Advisory Given  Plan Discussed with: CRNA, Surgeon and Anesthesiologist  Anesthesia Plan Comments:        Anesthesia Quick Evaluation

## 2012-10-29 ENCOUNTER — Encounter (HOSPITAL_COMMUNITY): Payer: Self-pay | Admitting: Orthopedic Surgery

## 2012-10-29 LAB — CBC
HCT: 37.4 % — ABNORMAL LOW (ref 39.0–52.0)
Hemoglobin: 12.1 g/dL — ABNORMAL LOW (ref 13.0–17.0)
MCH: 31.1 pg (ref 26.0–34.0)
MCHC: 32.4 g/dL (ref 30.0–36.0)
MCV: 96.1 fL (ref 78.0–100.0)
Platelets: 179 10*3/uL (ref 150–400)
RBC: 3.89 MIL/uL — ABNORMAL LOW (ref 4.22–5.81)
RDW: 14.6 % (ref 11.5–15.5)
WBC: 12.8 10*3/uL — ABNORMAL HIGH (ref 4.0–10.5)

## 2012-10-29 LAB — BASIC METABOLIC PANEL
BUN: 11 mg/dL (ref 6–23)
CO2: 29 mEq/L (ref 19–32)
Calcium: 8.5 mg/dL (ref 8.4–10.5)
Chloride: 104 mEq/L (ref 96–112)
Creatinine, Ser: 0.82 mg/dL (ref 0.50–1.35)
GFR calc Af Amer: >90 mL/min (ref >90)
GFR calc non Af Amer: >90 mL/min (ref >90)
Glucose, Bld: 104 mg/dL — ABNORMAL HIGH (ref 70–99)
Potassium: 3.7 mEq/L (ref 3.5–5.1)
Sodium: 140 mEq/L (ref 135–145)

## 2012-10-29 LAB — PROTIME-INR
INR: 1.13 (ref 0.00–1.49)
Prothrombin Time: 14.3 seconds (ref 11.6–15.2)

## 2012-10-29 MED ORDER — WARFARIN SODIUM 7.5 MG PO TABS
7.5000 mg | ORAL_TABLET | Freq: Once | ORAL | Status: AC
  Administered 2012-10-29: 7.5 mg via ORAL
  Filled 2012-10-29: qty 1

## 2012-10-29 NOTE — Anesthesia Postprocedure Evaluation (Signed)
  Anesthesia Post-op Note  Patient: [name] [name]  Procedure(s) Performed: Procedure(s) (LRB) with comments: TOTAL HIP ARTHROPLASTY (Right)  Patient Location: PACU  Anesthesia Type:General  Level of Consciousness: awake  Airway and Oxygen Therapy: Patient Spontanous Breathing  Post-op Pain: mild  Post-op Assessment: Post-op Vital signs reviewed, Patient's Cardiovascular Status Stable, Respiratory Function Stable, Patent Airway, No signs of Nausea or vomiting and Pain level controlled  Post-op Vital Signs: stable  Complications: No apparent anesthesia complications

## 2012-10-29 NOTE — Op Note (Signed)
NAME:  [name] [name], [name] [name] NO.:  [id number]  MEDICAL RECORD NO.:  [id number]  LOCATION:  5N30C                        FACILITY:  [facility]  PHYSICIAN:  [name], M.D. DATE OF BIRTH:  1951/04/26  DATE OF PROCEDURE:  10/28/2012 DATE OF DISCHARGE:                              OPERATIVE REPORT   PREOPERATIVE DIAGNOSIS:  Right hip end-stage degenerative arthritis.  POSTOPERATIVE DIAGNOSIS:  Right hip end-stage degenerative arthritis.  PROCEDURE:  Right total hip replacement at the posterolateral approach. Stryker Secur-Fit prosthesis.  Press-fit 54-mm acetabular component. Screw fixation x2.  40-mm internal diameter liner.  A press-fit #8 femoral stem, 127-degree neck angle.  A 54+ 2.5 metallic head.  SURGEON:  [name], M.D.  ASSISTANT:  Alyson Locket. Ricard Dillon, [place], present throughout the entire case and necessary for timely completion of procedure.  ANESTHESIA:  General.  BLOOD LOSS:  100 mL.  BLOOD GIVEN:  None.  SPECIMENS:  None.  CULTURES:  None.  COMPLICATION:  None.  DRESSINGS:  Soft compressive with abduction pillow.  PROCEDURE:  The patient was brought to the operating room and placed on the operating table in supine position.  After adequate anesthesia had been obtained, turned to a lateral position, right side up.  Prepped and draped in usual sterile fashion.  Incision along the shaft of femur extending posterosuperior.  Skin and subcutaneous tissue were divided. Iliotibial band was exposed and incised.  Charnley retractor was put in place.  Neurovascular structure was identified and protected.  External rotator and capsule were taken down off the back of the intertrochanteric groove of the femur, tagged with FiberWire.  Hip was dislocated.  Femoral neck cut 1 fingerbreadth above the lesser trochanter.  Acetabulum was exposed.  Grade 4 change throughout.  Spurs, redundant labrum debrided out.  Acetabulum brought up to good bleeding bone.   Sized for a 54-mm component.  Hammered in place at 45 degrees of abduction, 20 degrees of anteversion.  Augmented with 2 screws through the cup.  A 40-mm internal diameter polyethylene liner.  Femur approach. Distal proximal reaming and broaching.  Brought up to #8 component restoring anteversion.  After appropriate trials, #8 component was seated.  A 40+ 2.5 head attached.  Hip reduced.  Great stability, flexion and extension.  Full motion.  Equal leg lengths.  Wound irrigated.  External rotator and capsule were repaired through drill holes at the back of the intertrochanteric groove, tied over a bony bridge.  Charnley retractor was removed.  Iliotibial band was closed with #1 Vicryl.  Skin and subcutaneous tissue with Vicryl and staples. Prior to closure, the subcutaneous tissue was injected with Exparel. Sterile compressive dressing was applied.  Abduction pillow was applied. Anesthesia was reversed.  Brought to the recovery room.  Tolerated the surgery well.  No complications.     [name], M.D.     DFM/MEDQ  D:  10/28/2012  T:  10/29/2012  Job:  [id number]

## 2012-10-29 NOTE — Clinical Social Work Psychosocial (Addendum)
Clinical Social Work Department  BRIEF PSYCHOSOCIAL ASSESSMENT  Patient: [name] [name] Account Number: [id number] Admit date: 10/27/12  Clinical Social Worker Date/Time:  Referred by: Physician Date Referred: 10/29/12  Referred for   SNF Placement   Other Referral:  Interview type: Patient  Other interview type:  PSYCHOSOCIAL DATA  Living Status: Alone Admitted from facility:  Level of care:  Primary support name: [name] [name],[name] [name] Primary support relationship to patient: Friend Degree of support available:  Fair   CURRENT CONCERNS  Current Concerns   Post-Acute Placement   Other Concerns:  SOCIAL WORK ASSESSMENT / PLAN  CSW met with pt re: PT recommendation for SNF.   Pt lives alone  CSW explained placement process and answered questions.   Pt's  reports [name].Patient has a bed reserved at [facility] completed [place] and initiated [facility] search.   Weekday CSW to f/u with offers.   Assessment/plan status: Information/Referral to [employer]  Other assessment/ plan:  Information/referral to community resources:  SNF   PTAR   PATIENT'S/FAMILY'S RESPONSE TO PLAN OF CARE:  Pt is agreeable to SNF for the patient in order to increase strength and independence with mobility prior to return home independently. Pt verbalized understanding of placement process and appreciation for CSW assist.   [name] [name], MSW  [phone]

## 2012-10-29 NOTE — Clinical Social Work Placement (Addendum)
CLINICAL SOCIAL WORK PLACEMENT NOTE  10/29/2012  Patient: Wilbern Pennypacker Account Number: [id number]  Admit date: 10/27/2012  Clinical Social Worker: Rhea Pink CSW Date/time: 10/29/2012 11:30 AM  Clinical Social Work is seeking post-discharge placement for this patient at the following level of care: [place] (*CSW will update this form in [place] as items are completed)  10/29/2012 Patient/family provided with [place] Department of Clinical Social Work's list of facilities offering this level of care within the geographic area requested by the patient (or if unable, by the patient's family).  10/29/2012 Patient/family informed of their freedom to choose among providers that offer the needed level of care, that participate in Medicare, Medicaid or managed care program needed by the patient, have an available bed and are willing to accept the patient.  10/29/2012 Patient/family informed of [facility]' ownership interest in [facility], as well as of the fact that they are under no obligation to receive care at this facility.  PASARR submitted to EDS on 10/30/12  PASARR number received from EDS on 10/30/12  FL2 transmitted to all facilities in geographic area requested by pt/family on 10/29/2012  FL2 transmitted to all facilities within larger geographic area on  Patient informed that his/her managed care company has contracts with or will negotiate with certain facilities, including the following:  Patient/family informed of bed offers received: 10/29/2012  Patient chooses bed at [facility] Physician recommends and patient chooses bed at  Patient to be transferred to on 10/30/12 Patient to be transferred to facility by [facility] The following physician request were entered in Epic:  Additional Comments:  Rhea Pink, MSW

## 2012-10-29 NOTE — Progress Notes (Signed)
ANTICOAGULATION CONSULT NOTE - Follow Up Consult  Pharmacy Consult for Coumadin Indication: VTE prophylaxis  Allergies  Allergen Reactions  . Actos (Pioglitazone Hydrochloride)     Weight gain    Patient Measurements: Height: 5' 8"  (172.7 cm) Weight: 227 lb (102.967 kg) IBW/kg (Calculated) : 68.4   Vital Signs: Temp: 99.7 F (37.6 C) (12/19 0629) Temp src: Oral (12/19 0629) BP: 114/61 mmHg (12/19 0629) Pulse Rate: 73  (12/19 0629)  Labs:  Basename 10/29/12 0500  HGB 12.1*  HCT 37.4*  PLT 179  APTT --  LABPROT 14.3  INR 1.13  HEPARINUNFRC --  CREATININE 0.82  CKTOTAL --  CKMB --  TROPONINI --    Estimated Creatinine Clearance: 111.4 ml/min (by C-G formula based on Cr of 0.82).   Medications:  Scheduled:    . atenolol  25 mg Oral Daily  . atorvastatin  40 mg Oral q1800  . [COMPLETED]  ceFAZolin (ANCEF) IV  1 g Intravenous Q8H  . coumadin book   Does not apply Once  . docusate sodium  100 mg Oral BID  . enoxaparin (LOVENOX) injection  40 mg Subcutaneous Q24H  . hydrochlorothiazide  12.5 mg Oral Daily  . [EXPIRED] HYDROmorphone      . [EXPIRED] HYDROmorphone      . lisinopril  10 mg Oral Daily  . NONFORMULARY OR COMPOUNDED ITEM 12.5 mg  12.5 mg Oral Q24H  . thiamine  100 mg Oral Daily  . vitamin B-12  250 mcg Oral Daily  . [COMPLETED] warfarin  7.5 mg Oral ONCE-1800  . Warfarin - Pharmacist Dosing Inpatient   Does not apply q1800    Assessment: 61 yo M s/p THA 12/18 on Coumadin for post-op VTE prophylaxis.  INR subtherapeutic but trending up.  No bleeding noted.  Goal of Therapy:  INR 2-3   Plan:  Repeat Coumadin 7.5 mg dose tonight. Follow-up with INR in AM.  Horton Chin, Pharm.D., BCPS Clinical Pharmacist Pager [phone] 10/29/2012 1:37 PM

## 2012-10-29 NOTE — Evaluation (Signed)
Physical Therapy Evaluation Patient Details Name: [name] [name] MRN: [id number] DOB: 1951-01-06 Today's Date: 10/29/2012 Time: 0822-0855 PT Time Calculation (min): 33 min  PT Assessment / Plan / Recommendation Clinical Impression  Pt is a 61 y/o male admitted s/p right THA along with the below PT problem list. Pt would benefit from acute PT to maximize independence and facilitate d/c to [facility]NF.    PT Assessment  Patient needs continued PT services    Follow Up Recommendations  SNF    Does the patient have the potential to tolerate intense rehabilitation      Barriers to Discharge Decreased caregiver support      Equipment Recommendations  Rolling walker with 5" wheels    Recommendations for Other Services     Frequency 7X/week    Precautions / Restrictions Precautions Precautions: Posterior Hip Precaution Booklet Issued: Yes (comment) Precaution Comments: Educated pt on 3/3 posterior hip precautions. Restrictions Weight Bearing Restrictions: Yes RLE Weight Bearing: Touchdown weight bearing   Pertinent Vitals/Pain 5/10 in right hip. Pt repositioned with ice applied.      Mobility  Bed Mobility Bed Mobility: Supine to Sit Supine to Sit: 4: Min assist Details for Bed Mobility Assistance: Assist for right LE due to pain. Cues for sequence to protect and maintain posterior hip precautions. Transfers Transfers: Sit to Stand;Stand to Sit Sit to Stand: 4: Min assist;With upper extremity assist;From bed Stand to Sit: 3: Mod assist;With upper extremity assist;To chair/3-in-1 Details for Transfer Assistance: Assist to translate trunk anterior over BOS as well as to slow descent to chair. Cues for safest hand/right LE placement. Ambulation/Gait Ambulation/Gait Assistance: 4: Min assist Ambulation Distance (Feet): 45 Feet Assistive device: Rolling walker Ambulation/Gait Assistance Details: Assist for balance and to off weight right LE in order to maintain WBing status. Cues  for safe sequence. Gait Pattern: Step-to pattern;Decreased step length - right;Decreased stance time - right;Trunk flexed Stairs: No Wheelchair Mobility Wheelchair Mobility: No    Shoulder Instructions     Exercises Total Joint Exercises Ankle Circles/Pumps: AROM;Right;10 reps;Supine Quad Sets: AROM;Right;10 reps;Supine Heel Slides: AAROM;Right;10 reps;Supine   PT Diagnosis: Difficulty walking;Acute pain  PT Problem List: Decreased strength;Decreased activity tolerance;Decreased balance;Decreased mobility;Decreased knowledge of use of DME;Decreased knowledge of precautions;Pain PT Treatment Interventions: DME instruction;Gait training;Functional mobility training;Therapeutic activities;Therapeutic exercise;Balance training;Patient/family education   PT Goals Acute Rehab PT Goals PT Goal Formulation: With patient Time For Goal Achievement: 11/12/12 Potential to Achieve Goals: Good Pt will go Supine/Side to Sit: with modified independence PT Goal: Supine/Side to Sit - Progress: Goal set today Pt will go Sit to Supine/Side: with modified independence PT Goal: Sit to Supine/Side - Progress: Goal set today Pt will go Sit to Stand: with modified independence PT Goal: Sit to Stand - Progress: Goal set today Pt will go Stand to Sit: with modified independence PT Goal: Stand to Sit - Progress: Goal set today Pt will Ambulate: >150 feet;with modified independence;with least restrictive assistive device PT Goal: Ambulate - Progress: Goal set today Pt will Perform Home Exercise Program: Independently PT Goal: Perform Home Exercise Program - Progress: Goal set today  Visit Information  Last PT Received On: 10/29/12 Assistance Needed: +1    Subjective Data  Subjective: "How are you going to hurt me this morning? Haha." Patient Stated Goal: Get well.   Prior Functioning  Home Living Lives With: Alone Type of Home: House Home Access: Stairs to enter Entrance Stairs-Number of Steps:  2 Entrance Stairs-Rails: None Home Layout: One level Home Adaptive  Equipment: Straight cane Prior Function Level of Independence: Independent Able to Take Stairs?: Yes Driving: Yes Vocation: Full time employment Communication Communication: No difficulties Dominant Hand: Right    Cognition  Overall Cognitive Status: Appears within functional limits for tasks assessed/performed Arousal/Alertness: Awake/alert Orientation Level: Appears intact for tasks assessed Behavior During Session: [facility] for tasks performed    Extremity/Trunk Assessment Right Upper Extremity Assessment RUE ROM/Strength/Tone: Within functional levels RUE Sensation: WFL - Light Touch RUE Coordination: WFL - gross/fine motor Left Upper Extremity Assessment LUE ROM/Strength/Tone: Within functional levels LUE Sensation: WFL - Light Touch LUE Coordination: WFL - gross/fine motor Right Lower Extremity Assessment RLE ROM/Strength/Tone: Deficits;Due to pain RLE ROM/Strength/Tone Deficits: 3/5 throughout RLE Sensation: WFL - Light Touch RLE Coordination: WFL - gross motor Left Lower Extremity Assessment LLE ROM/Strength/Tone: Within functional levels LLE Sensation: WFL - Light Touch LLE Coordination: WFL - gross motor Trunk Assessment Trunk Assessment: Normal   Balance Balance Balance Assessed: No  End of Session PT - End of Session Equipment Utilized During Treatment: Gait belt Activity Tolerance: Patient tolerated treatment well Patient left: in chair;with call bell/phone within reach Nurse Communication: Mobility status  GP     Cyndia Bent 10/29/2012, 9:53 AM  10/29/2012 Cyndia Bent, PT, DPT [phone]

## 2012-10-29 NOTE — Progress Notes (Deleted)
Physical Therapy Treatment Patient Details Name: [name] [name] MRN: [id number] DOB: 22-May-1951 Today's Date: 10/29/2012 Time: 0822-0855 PT Time Calculation (min): 33 min  PT Assessment / Plan / Recommendation Comments on Treatment Session       Follow Up Recommendations  SNF     Does the patient have the potential to tolerate intense rehabilitation     Barriers to Discharge Decreased caregiver support      Equipment Recommendations  Rolling walker with 5" wheels    Recommendations for Other Services    Frequency 7X/week   Plan      Precautions / Restrictions Precautions Precautions: Posterior Hip Precaution Booklet Issued: Yes (comment) Precaution Comments: Educated pt on 3/3 posterior hip precautions. Restrictions Weight Bearing Restrictions: Yes RLE Weight Bearing: Touchdown weight bearing   Pertinent Vitals/Pain 5/10 in right hip. Pt repositioned and ice applied.    Mobility  Bed Mobility Bed Mobility: Supine to Sit Supine to Sit: 4: Min assist Details for Bed Mobility Assistance: Assist for right LE due to pain. Cues for sequence to protect and maintain posterior hip precautions. Transfers Transfers: Sit to Stand;Stand to Sit Sit to Stand: 4: Min assist;With upper extremity assist;From bed Stand to Sit: 3: Mod assist;With upper extremity assist;To chair/3-in-1 Details for Transfer Assistance: Assist to translate trunk anterior over BOS as well as to slow descent to chair. Cues for safest hand/right LE placement. Ambulation/Gait Ambulation/Gait Assistance: 4: Min assist Ambulation Distance (Feet): 45 Feet Assistive device: Rolling walker Ambulation/Gait Assistance Details: Assist for balance and to off weight right LE in order to maintain WBing status. Cues for safe sequence. Gait Pattern: Step-to pattern;Decreased step length - right;Decreased stance time - right;Trunk flexed Stairs: No Wheelchair Mobility Wheelchair Mobility: No    Exercises Total Joint  Exercises Ankle Circles/Pumps: AROM;Right;10 reps;Supine Quad Sets: AROM;Right;10 reps;Supine Heel Slides: AAROM;Right;10 reps;Supine   PT Diagnosis: Difficulty walking;Acute pain  PT Problem List: Decreased strength;Decreased activity tolerance;Decreased balance;Decreased mobility;Decreased knowledge of use of DME;Decreased knowledge of precautions;Pain PT Treatment Interventions: DME instruction;Gait training;Functional mobility training;Therapeutic activities;Therapeutic exercise;Balance training;Patient/family education   PT Goals Acute Rehab PT Goals PT Goal Formulation: With patient Time For Goal Achievement: 11/12/12 Potential to Achieve Goals: Good Pt will go Supine/Side to Sit: with modified independence PT Goal: Supine/Side to Sit - Progress: Goal set today Pt will go Sit to Supine/Side: with modified independence PT Goal: Sit to Supine/Side - Progress: Goal set today Pt will go Sit to Stand: with modified independence PT Goal: Sit to Stand - Progress: Goal set today Pt will go Stand to Sit: with modified independence PT Goal: Stand to Sit - Progress: Goal set today Pt will Ambulate: >150 feet;with modified independence;with least restrictive assistive device PT Goal: Ambulate - Progress: Goal set today Pt will Perform Home Exercise Program: Independently PT Goal: Perform Home Exercise Program - Progress: Goal set today  Visit Information  Last PT Received On: 10/29/12 Assistance Needed: +1    Subjective Data  Subjective: "How are you going to hurt me this morning? Haha." Patient Stated Goal: Get well.   Cognition  Overall Cognitive Status: Appears within functional limits for tasks assessed/performed Arousal/Alertness: Awake/alert Orientation Level: Appears intact for tasks assessed Behavior During Session: [facility] for tasks performed    Balance  Balance Balance Assessed: No  End of Session PT - End of Session Equipment Utilized During Treatment: Gait  belt Activity Tolerance: Patient tolerated treatment well Patient left: in chair;with call bell/phone within reach Nurse Communication: Mobility status   GP  Bryndan Bilyk M 10/29/2012, 9:01 AM  10/29/2012 Cyndia Bent, PT, DPT [phone]

## 2012-10-29 NOTE — Progress Notes (Signed)
Occupational Therapy Evaluation Patient Details Name: [name] [name] MRN: [id number] DOB: 07-20-1951 Today's Date: 10/29/2012 Time: 3557-3220 OT Time Calculation (min): 28 min  OT Assessment / Plan / Recommendation Clinical Impression  61 yo s/p R THA. Pt will need rehab at SNF to return to PLOF. All further OT to be addressed at SNF.    OT Assessment  All further OT needs can be met in the next venue of care    Follow Up Recommendations  SNF    Barriers to Discharge  decreased caregiver support    Equipment Recommendations  3 in 1 bedside comode;Tub/shower bench    Recommendations for Other Services  none  Frequency    eval only   Precautions / Restrictions Precautions Precautions: Posterior Hip Precaution Comments: Pt able to recall 1/3 precautions and WBS Restrictions RLE Weight Bearing: Touchdown weight bearing   Pertinent Vitals/Pain 5. Hip nsg aware    ADL  Grooming: Set up;Supervision/safety Where Assessed - Grooming: Supported sitting Upper Body Bathing: Supervision/safety;Set up Where Assessed - Upper Body Bathing: Supported sitting Lower Body Bathing: Maximal assistance Where Assessed - Lower Body Bathing: Supported sit to stand Upper Body Dressing: Supervision/safety;Set up Where Assessed - Upper Body Dressing: Supported sitting Lower Body Dressing: Maximal assistance Where Assessed - Lower Body Dressing: Supported sit to Lobbyist: Minimal assistance Toilet Transfer Method: Sit to stand;Stand pivot Science writer: Therapist, occupational and Hygiene: Minimal assistance Where Assessed - Best boy and Hygiene: Sit to stand from 3-in-1 or toilet Equipment Used: Gait belt;Rolling walker;Reacher;Sock aid;Long-handled sponge Transfers/Ambulation Related to ADLs: Min A ADL Comments: remembered use of AE from class taken prior to surgery    OT Diagnosis: Generalized weakness;Acute pain   OT Problem List: Decreased strength;Decreased range of motion;Decreased activity tolerance;Decreased safety awareness;Decreased knowledge of use of DME or AE;Decreased knowledge of precautions;Pain OT Treatment Interventions:     OT Goals Acute Rehab OT Goals OT Goal Formulation:  (eval only)  Visit Information  Last OT Received On: 10/29/12 Assistance Needed: +1    Subjective Data      Prior Functioning     Home Living Lives With: Alone Type of Home: House Home Access: Stairs to enter Technical brewer of Steps: 2 Entrance Stairs-Rails: None Home Layout: One level Home Adaptive Equipment: Straight cane Additional Comments: plans to rehab at [facility] Prior Function Level of Independence: Independent Able to Take Stairs?: Yes Driving: Yes Vocation: Full time employment Communication Communication: No difficulties         Vision/Perception     Cognition  Overall Cognitive Status: Appears within functional limits for tasks assessed/performed Arousal/Alertness: Awake/alert Orientation Level: Appears intact for tasks assessed Behavior During Session: [facility] for tasks performed    Extremity/Trunk Assessment Right Upper Extremity Assessment RUE ROM/Strength/Tone: [facility] for tasks assessed Left Upper Extremity Assessment LUE ROM/Strength/Tone: WFL for tasks assessed Right Lower Extremity Assessment RLE ROM/Strength/Tone: Deficits (R THA) Left Lower Extremity Assessment LLE ROM/Strength/Tone: WFL for tasks assessed Trunk Assessment Trunk Assessment: Normal     Mobility Bed Mobility Bed Mobility: Not assessed Transfers Transfers: Sit to Stand;Stand to Sit Sit to Stand: 4: Min assist;With upper extremity assist;From chair/3-in-1 Stand to Sit: 4: Min assist;With upper extremity assist;To chair/3-in-1 Details for Transfer Assistance: with increased time     Shoulder Instructions     Exercise     Balance  s   End of Session OT - End of  Session Equipment Utilized During Treatment: Gait belt Activity  Tolerance: Patient tolerated treatment well Patient left: in chair;with call bell/phone within reach Nurse Communication: Mobility status  GO     Aramis Weil,HILLARY 10/29/2012, 2:34 PM [facility], OTR/L  [phone] 10/29/2012

## 2012-10-29 NOTE — Progress Notes (Signed)
Subjective: Doing well.  Pain controlled.   Lives alone.  Will need rehab placement at [facility].  Objective: Vital signs in last 24 hours: Temp:  [98.5 F (36.9 C)-99.7 F (37.6 C)] 98.5 F (36.9 C) (12/19 1352) Pulse Rate:  [73-91] 81  (12/19 1352) Resp:  [13-18] 18  (12/19 1352) BP: (106-150)/(61-85) 106/71 mmHg (12/19 1352) SpO2:  [97 %-100 %] 97 % (12/19 1352)  Intake/Output from previous day: 12/18 0701 - 12/19 0700 In: 2490 [P.O.:840; I.V.:1650] Out: 2375 [Urine:2075; Blood:300] Intake/Output this shift: Total I/O In: 480 [P.O.:480] Out: 225 [Urine:225]   Basename 10/29/12 0500  HGB 12.1*    Basename 10/29/12 0500  WBC 12.8*  RBC 3.89*  HCT 37.4*  PLT 179    Basename 10/29/12 0500  NA 140  K 3.7  CL 104  CO2 29  BUN 11  CREATININE 0.82  GLUCOSE 104*  CALCIUM 8.5    Basename 10/29/12 0500  LABPT --  INR 1.13   Exam:  Dressing c/d/i.  Calf nt, nvi.    Assessment/Plan: D/c dilaudid.  Transfer to [facility] Friday or Saturday.     OWENS,JAMES M 10/29/2012, 2:07 PM

## 2012-10-30 LAB — CBC
HCT: 32.5 % — ABNORMAL LOW (ref 39.0–52.0)
Hemoglobin: 10.8 g/dL — ABNORMAL LOW (ref 13.0–17.0)
MCH: 32 pg (ref 26.0–34.0)
MCHC: 33.2 g/dL (ref 30.0–36.0)
MCV: 96.4 fL (ref 78.0–100.0)
Platelets: 164 10*3/uL (ref 150–400)
RBC: 3.37 MIL/uL — ABNORMAL LOW (ref 4.22–5.81)
RDW: 14.3 % (ref 11.5–15.5)
WBC: 12.3 10*3/uL — ABNORMAL HIGH (ref 4.0–10.5)

## 2012-10-30 LAB — BASIC METABOLIC PANEL
BUN: 13 mg/dL (ref 6–23)
CO2: 27 mEq/L (ref 19–32)
Calcium: 9.5 mg/dL (ref 8.4–10.5)
Chloride: 101 mEq/L (ref 96–112)
Creatinine, Ser: 0.85 mg/dL (ref 0.50–1.35)
GFR calc Af Amer: >90 mL/min (ref >90)
GFR calc non Af Amer: >90 mL/min (ref >90)
Glucose, Bld: 124 mg/dL — ABNORMAL HIGH (ref 70–99)
Potassium: 3.6 mEq/L (ref 3.5–5.1)
Sodium: 138 mEq/L (ref 135–145)

## 2012-10-30 LAB — PROTIME-INR
INR: 1.68 — ABNORMAL HIGH (ref 0.00–1.49)
Prothrombin Time: 19.2 seconds — ABNORMAL HIGH (ref 11.6–15.2)

## 2012-10-30 MED ORDER — WARFARIN SODIUM 4 MG PO TABS
4.0000 mg | ORAL_TABLET | Freq: Once | ORAL | Status: AC
  Administered 2012-10-30: 4 mg via ORAL
  Filled 2012-10-30: qty 1

## 2012-10-30 MED ORDER — METHOCARBAMOL 500 MG PO TABS: 500.0000 mg | ORAL_TABLET | Freq: Four times a day (QID) | ORAL | Status: DC | PRN

## 2012-10-30 MED ORDER — WARFARIN SODIUM 5 MG PO TABS: 5.0000 mg | ORAL_TABLET | Freq: Every day | ORAL | Status: DC

## 2012-10-30 MED ORDER — MAGNESIUM HYDROXIDE 400 MG/5ML PO SUSP
30.0000 mL | Freq: Every day | ORAL | Status: DC | PRN
  Administered 2012-10-30: 30 mL via ORAL
  Filled 2012-10-30: qty 30

## 2012-10-30 MED ORDER — CLOPIDOGREL BISULFATE 75 MG PO TABS: 75.0000 mg | ORAL_TABLET | Freq: Every day | ORAL | Status: DC

## 2012-10-30 MED ORDER — ENOXAPARIN SODIUM 40 MG/0.4ML ~~LOC~~ SOLN: 40.0000 mg | SUBCUTANEOUS | Status: DC

## 2012-10-30 MED ORDER — OXYCODONE-ACETAMINOPHEN 5-325 MG PO TABS: 1.0000 | ORAL_TABLET | ORAL | Status: DC | PRN

## 2012-10-30 NOTE — Progress Notes (Signed)
Clinical social worker assisted with patient discharge to skilled nursing facility, [facility].  CSW addressed all family questions and concerns. CSW copied chart and added all important documents. CSW also set up patient transportation with [employer] operational officer. Clinical Social Worker will sign off for now as social work intervention is no longer needed.   Rhea Pink, MSW, [place]

## 2012-10-30 NOTE — Progress Notes (Signed)
Physical Therapy Treatment Patient Details Name: [name] [name] MRN: [id number] DOB: 1951/10/10 Today's Date: 10/30/2012 Time: 8250-5397 PT Time Calculation (min): 25 min  PT Assessment / Plan / Recommendation Comments on Treatment Session  Patient progressing well. Need to continue to reinforce precautions with mobility    Follow Up Recommendations  SNF     Does the patient have the potential to tolerate intense rehabilitation     Barriers to Discharge        Equipment Recommendations  Rolling walker with 5" wheels    Recommendations for Other Services    Frequency 7X/week   Plan Discharge plan remains appropriate;Frequency remains appropriate    Precautions / Restrictions Precautions Precautions: Posterior Hip Precaution Comments: Patient able to recall 2/3 precautions. Cues for no crossing of legs Restrictions RLE Weight Bearing: Weight bearing as tolerated   Pertinent Vitals/Pain     Mobility  Bed Mobility Supine to Sit: 4: Min assist Details for Bed Mobility Assistance: A with R LE to protect precautions Transfers Sit to Stand: 4: Min guard;With upper extremity assist;From bed Stand to Sit: 4: Min guard;With upper extremity assist;With armrests;To chair/3-in-1 Details for Transfer Assistance: Cues for safe technique. Patient falling back into recliner. Cues for safety and to protect precautions Ambulation/Gait Ambulation/Gait Assistance: 4: Min guard Ambulation Distance (Feet): 50 Feet Assistive device: Rolling walker Ambulation/Gait Assistance Details: Cues to maintain weight bearing status Gait Pattern: Step-to pattern;Trunk flexed    Exercises Total Joint Exercises Quad Sets: AROM;Right;10 reps Short Arc Quad: 15 reps Heel Slides: AAROM;Right;10 reps Hip ABduction/ADduction: AAROM;Right;10 reps Long Arc Quad: AROM;Right;10 reps   PT Diagnosis:    PT Problem List:   PT Treatment Interventions:     PT Goals Acute Rehab PT Goals PT Goal: Supine/Side  to Sit - Progress: Progressing toward goal PT Goal: Sit to Stand - Progress: Progressing toward goal PT Goal: Stand to Sit - Progress: Progressing toward goal PT Goal: Ambulate - Progress: Progressing toward goal PT Goal: Perform Home Exercise Program - Progress: Progressing toward goal  Visit Information  Last PT Received On: 10/30/12 Assistance Needed: +1    Subjective Data      Cognition  Overall Cognitive Status: Appears within functional limits for tasks assessed/performed Arousal/Alertness: Awake/alert Orientation Level: Appears intact for tasks assessed Behavior During Session: [facility] for tasks performed    Balance     End of Session PT - End of Session Equipment Utilized During Treatment: Gait belt Activity Tolerance: Patient tolerated treatment well Patient left: in chair;with call bell/phone within reach Nurse Communication: Mobility status   GP     Jacqualyn Posey 10/30/2012, 11:48 AM 10/30/2012 Jacqualyn Posey PTA [phone] pager [phone] office

## 2012-10-30 NOTE — Discharge Summary (Signed)
Physician Discharge Summary  Patient ID: [name] [name] MRN: [id number] DOB/AGE: [birth year] 61 y.o.  Admit date: 10/28/2012 Discharge date: 10/30/2012  Admission Diagnoses: Right hip djd Discharge Diagnoses:  Right total hip replacment  Discharged Condition: good  Hospital Course:   61 yo wm with hx of end stage djd right hip and pain was taken to the OR 28 Oct 2012 for total hip replacement.  Tolerated anesthesia and surgery well without complication.  Transferred to ortho unit and protocol lovenox and coumadin started for dvt prophylaxis.  19 dec, doing well with good pain control.  Calf nt, nvi.  Therapy started.  20 dec, doing well.  No complaints.  Ready for transfer to [facility] for rehab.  Hip wound looks good, staples intact.  No drainage or signs of infection.  Calf nt, nvi.    Consults: None   Discharge Exam: Blood pressure 132/56, pulse 71, temperature 99.6 F (37.6 C), temperature source Oral, resp. rate 16, height 5' 8"  (1.727 m), weight 102.967 kg (227 lb), SpO2 100.00%.  Disposition:  transfer to [facility]   Discharge Orders    Future Orders Please Complete By Expires   Diet - low sodium heart healthy      Call MD / Call 911      Comments:   If you experience chest pain or shortness of breath, CALL 911 and be transported to the hospital emergency room.  If you develope a fever above 101 F, pus (white drainage) or increased drainage or redness at the wound, or calf pain, call your surgeon's office.   Constipation Prevention      Comments:   Drink plenty of fluids.  Prune juice may be helpful.  You may use a stool softener, such as Colace (over the counter) 100 mg twice a day.  Use MiraLax (over the counter) for constipation as needed.   Increase activity slowly as tolerated      Discharge instructions      Comments:   Ok to shower, but no tub soaking.  Do not apply any creams or ointments to incision.  Continue physical therapy protocol.   Follow the hip  precautions as taught in Physical Therapy      Scheduling Instructions:   Touchdown weightbearing until return office visit.   Change dressing      Comments:   You may change your dressing daily with sterile 4 x 4 inch gauze dressing and paper tape.   TED hose      Comments:   Use stockings (TED hose) for 3-4 weeks on both leg(s).  You may remove them at night for sleeping.       Medication List     As of 10/30/2012 12:31 PM    STOP taking these medications         aspirin 81 MG tablet      Ginkgo Biloba 40 MG Tabs      GLUCOSAMINE HCL-MSM PO      multivitamin with minerals Tabs      OVER THE COUNTER MEDICATION      oxyCODONE-acetaminophen 7.5-500 MG per tablet   Commonly known as: PERCOCET      traMADol-acetaminophen 37.5-325 MG per tablet   Commonly known as: ULTRACET      TRIPLE OMEGA COMPLEX PO      vitamin E 400 UNIT capsule      TAKE these medications         atenolol 25 MG tablet   Commonly known as:  TENORMIN   Take 25 mg by mouth daily.      atorvastatin 40 MG tablet   Commonly known as: LIPITOR   Take 40 mg by mouth daily.      clomiPHENE 50 MG tablet   Commonly known as: CLOMID   Take 12.5 mg by mouth daily. (Take 1/4 tablet daily)      clopidogrel 75 MG tablet   Commonly known as: PLAVIX   Take 1 tablet (75 mg total) by mouth daily.      Co Q 10 100 MG Caps   Take 1 capsule by mouth daily.      DHA-EPA-VIT B6-B12-FOLIC ACID PO   Take by mouth.      enoxaparin 40 MG/0.4ML injection   Commonly known as: LOVENOX   Inject 0.4 mLs (40 mg total) into the skin daily.      lisinopril-hydrochlorothiazide 10-12.5 MG per tablet   Commonly known as: PRINZIDE,ZESTORETIC   Take 1 tablet by mouth daily.      Magnesium 400 MG Caps   Take 1 capsule by mouth daily.      methocarbamol 500 MG tablet   Commonly known as: ROBAXIN   Take 1 tablet (500 mg total) by mouth every 6 (six) hours as needed (spasms).      nitroGLYCERIN 0.4 MG SL tablet    Commonly known as: NITROSTAT   Place 0.4 mg under the tongue every 5 (five) minutes as needed. For chest pain      oxyCODONE-acetaminophen 5-325 MG per tablet   Commonly known as: PERCOCET/ROXICET   Take 1-2 tablets by mouth every 4 (four) hours as needed for pain.      thiamine 100 MG tablet   Commonly known as: VITAMIN B-1   Take 100 mg by mouth daily.      vitamin B-12 250 MCG tablet   Commonly known as: CYANOCOBALAMIN   Take 250 mcg by mouth daily.      vitamin C 1000 MG tablet   Take 2,000 mg by mouth daily.      Vitamin D 2000 UNITS tablet   Take 2,000 Units by mouth daily.      warfarin 5 MG tablet   Commonly known as: COUMADIN   Take 1 tablet (5 mg total) by mouth daily.      Zinc 50 MG Tabs   Take 50 mg by mouth daily.         SignedLanae Crumbly 10/30/2012, 12:31 PM

## 2012-10-30 NOTE — Progress Notes (Signed)
Subjective: Doing well.  Pain controlled.  No bm yet.  Ready for transfer to [place] for rehab.   Objective: Vital signs in last 24 hours: Temp:  [98.5 F (36.9 C)-99.6 F (37.6 C)] 99.6 F (37.6 C) (12/20 0608) Pulse Rate:  [71-94] 71  (12/20 0608) Resp:  [16-18] 16  (12/20 0608) BP: (106-143)/(56-80) 132/56 mmHg (12/20 0608) SpO2:  [97 %-100 %] 100 % (12/20 0608)  Intake/Output from previous day: 12/19 0701 - 12/20 0700 In: 1080 [P.O.:1080] Out: 625 [Urine:625] Intake/Output this shift:     Basename 10/30/12 0520 10/29/12 0500  HGB 10.8* 12.1*    Basename 10/30/12 0520 10/29/12 0500  WBC 12.3* 12.8*  RBC 3.37* 3.89*  HCT 32.5* 37.4*  PLT 164 179    Basename 10/30/12 0520 10/29/12 0500  NA 138 140  K 3.6 3.7  CL 101 104  CO2 27 29  BUN 13 11  CREATININE 0.85 0.82  GLUCOSE 124* 104*  CALCIUM 9.5 8.5    Basename 10/30/12 0520 10/29/12 0500  LABPT -- --  INR 1.68* 1.13    Exam:  Hip wound looks good.  Staples intact.  No drainage or signs of infection.  Calf nt, nvi.    Assessment/Plan: Transfer to [facility] today if bed available.     OWENS,JAMES M 10/30/2012, 12:21 PM

## 2012-10-30 NOTE — Progress Notes (Signed)
ANTICOAGULATION CONSULT NOTE - Follow Up Consult  Pharmacy Consult for Coumadin Indication: VTE prophylaxis s/p THA  Allergies  Allergen Reactions  . Actos (Pioglitazone Hydrochloride)     Weight gain   Patient Measurements: Height: 5' 8"  (172.7 cm) Weight: 227 lb (102.967 kg) IBW/kg (Calculated) : 68.4   Vital Signs: Temp: 99.6 F (37.6 C) (12/20 0608) Temp src: Oral (12/20 0608) BP: 132/56 mmHg (12/20 0608) Pulse Rate: 71  (12/20 0608)  Labs:  Basename 10/30/12 0520 10/29/12 0500  HGB 10.8* 12.1*  HCT 32.5* 37.4*  PLT 164 179  APTT -- --  LABPROT 19.2* 14.3  INR 1.68* 1.13  HEPARINUNFRC -- --  CREATININE 0.85 0.82  CKTOTAL -- --  CKMB -- --  TROPONINI -- --   Estimated Creatinine Clearance: 107.5 ml/min (by C-G formula based on Cr of 0.85).   Assessment: 61yo male s/p THA 10/28/12 on Coumadin for post-op VTE prophylaxis. Patient also on SQ Lovenox until INR >1.8.  INR sub-therapeutic at 1.68 but trending up. Hgb low post-op but no overt bleeding reported by RN or in chart notes. Per Ortho--PA notes, patient being discharged today.  Goal of Therapy:  INR 2-3   Plan:  1) Coumadin 4 mg x 1 tonight 2) Follow-up with INR in AM (if not discharged) 3) Monitor signs/symptoms bleeding  Woodroe Chen, PharmD    10/30/2012   12:44 PM

## 2012-10-30 NOTE — Discharge Planning (Signed)
Report called to Mateo Flow at [facility].

## 2012-12-29 ENCOUNTER — Other Ambulatory Visit: Payer: Self-pay | Admitting: *Deleted

## 2012-12-29 MED ORDER — ATENOLOL 25 MG PO TABS: 25.0000 mg | ORAL_TABLET | Freq: Every day | ORAL | Status: DC

## 2013-01-12 ENCOUNTER — Other Ambulatory Visit: Payer: Self-pay

## 2013-01-12 MED ORDER — LISINOPRIL-HYDROCHLOROTHIAZIDE 10-12.5 MG PO TABS: 1.0000 | ORAL_TABLET | Freq: Every day | ORAL | Status: DC

## 2013-01-13 ENCOUNTER — Other Ambulatory Visit: Payer: Self-pay

## 2013-01-13 MED ORDER — LISINOPRIL-HYDROCHLOROTHIAZIDE 10-12.5 MG PO TABS: 1.0000 | ORAL_TABLET | Freq: Every day | ORAL | Status: DC

## 2013-01-26 ENCOUNTER — Other Ambulatory Visit: Payer: Self-pay

## 2013-01-26 MED ORDER — ATORVASTATIN CALCIUM 40 MG PO TABS: 40.0000 mg | ORAL_TABLET | Freq: Every day | ORAL | Status: DC

## 2013-02-15 ENCOUNTER — Other Ambulatory Visit: Payer: Self-pay | Admitting: Endocrinology

## 2013-02-15 ENCOUNTER — Other Ambulatory Visit: Payer: Self-pay | Admitting: *Deleted

## 2013-02-15 MED ORDER — CLOMIPHENE CITRATE 50 MG PO TABS: 12.5000 mg | ORAL_TABLET | Freq: Every day | ORAL | Status: DC

## 2013-03-08 ENCOUNTER — Other Ambulatory Visit: Payer: Self-pay | Admitting: *Deleted

## 2013-03-08 MED ORDER — CLOPIDOGREL BISULFATE 75 MG PO TABS: 75.0000 mg | ORAL_TABLET | Freq: Every day | ORAL | Status: DC

## 2013-05-09 ENCOUNTER — Other Ambulatory Visit: Payer: Self-pay | Admitting: Endocrinology

## 2013-05-27 ENCOUNTER — Other Ambulatory Visit: Payer: Self-pay | Admitting: Endocrinology

## 2013-06-16 ENCOUNTER — Other Ambulatory Visit: Payer: Self-pay

## 2013-06-17 ENCOUNTER — Other Ambulatory Visit: Payer: Self-pay

## 2013-06-17 MED ORDER — CLOMIPHENE CITRATE 50 MG PO TABS: 12.5000 mg | ORAL_TABLET | Freq: Every day | ORAL | Status: DC

## 2013-07-25 ENCOUNTER — Other Ambulatory Visit: Payer: Self-pay | Admitting: Endocrinology

## 2013-07-26 ENCOUNTER — Other Ambulatory Visit: Payer: Self-pay | Admitting: *Deleted

## 2013-07-26 MED ORDER — CLOPIDOGREL BISULFATE 75 MG PO TABS: 75.0000 mg | ORAL_TABLET | Freq: Every day | ORAL | Status: DC

## 2013-07-29 ENCOUNTER — Telehealth: Payer: Self-pay | Admitting: Cardiology

## 2013-07-29 NOTE — Telephone Encounter (Signed)
New problem  Pt states a stent was put in w/ Dr [place]/// he needs an MRI done for the lower back///to get this procedure done he needs to know what brand of stent was put in (brand name) to have the MRI done.   Pt would like for this information to be faxed to [phone] Attn kate.  MRI sch for 08/02/13

## 2013-07-29 NOTE — Telephone Encounter (Signed)
[facility] faxed cath lab note stating stent type from cath in 2006 Horton Chin RN

## 2013-08-29 ENCOUNTER — Other Ambulatory Visit: Payer: Self-pay | Admitting: Endocrinology

## 2013-09-05 ENCOUNTER — Other Ambulatory Visit: Payer: Self-pay | Admitting: Endocrinology

## 2013-09-07 ENCOUNTER — Other Ambulatory Visit: Payer: Self-pay | Admitting: Endocrinology

## 2013-09-16 ENCOUNTER — Other Ambulatory Visit: Payer: Self-pay

## 2013-09-20 ENCOUNTER — Encounter: Payer: Self-pay | Admitting: Cardiology

## 2013-09-20 ENCOUNTER — Ambulatory Visit (INDEPENDENT_AMBULATORY_CARE_PROVIDER_SITE_OTHER): Payer: BC Managed Care – PPO | Admitting: Cardiology

## 2013-09-20 VITALS — BP 140/100 | HR 64 | Ht 68.0 in | Wt 234.0 lb

## 2013-09-20 DIAGNOSIS — I2510 Atherosclerotic heart disease of native coronary artery without angina pectoris: Secondary | ICD-10-CM

## 2013-09-20 MED ORDER — LISINOPRIL-HYDROCHLOROTHIAZIDE 20-25 MG PO TABS: 1.0000 | ORAL_TABLET | Freq: Every day | ORAL | Status: DC

## 2013-09-20 NOTE — Patient Instructions (Addendum)
Your physician wants you to follow-up in: 12 months.  You will receive a reminder letter in the mail two months in advance. If you don't receive a letter, please call our office to schedule the follow-up appointment.  Your physician has recommended you make the following change in your medication:  Increase lisinopril /hydrochlorothiazide to 20/25 mg by mouth daily

## 2013-09-20 NOTE — Progress Notes (Signed)
Patient ID: [name] [name], male   DOB: Jun 10, 1951, 63 y.o.   MRN: [id number]     Patient Name: [name] [name] Date of Encounter: 09/20/2013  Primary Care Provider:  [name], MD Primary Cardiologist:  [name], H   Problem List   Past Medical History  Diagnosis Date  . CORONARY ARTERY DISEASE   . HYPERLIPIDEMIA   . DM   . ANEMIA-IRON DEFICIENCY   . GERD   . DIVERTICULOSIS, COLON   . BACK PAIN, LUMBAR   . Unspecified disorder of urethra and urinary tract   . HYPERTENSION     dr tom wall   Past Surgical History  Procedure Laterality Date  . Esophagogastroduodenoscopy  11/07/2005  . Stress cardiolite  11/30/2004  . Electrocardiogram  03/30/2007  . Coronary angioplasty with stent placement      06    dr t. wall  . Kidney stones    . Total hip arthroplasty  10/28/2012    Procedure: TOTAL HIP ARTHROPLASTY;  Surgeon: [name], MD;  Location: [place];  Service: Orthopedics;  Laterality: Right;    Allergies  Allergies  Allergen Reactions  . Actos [Pioglitazone Hydrochloride]     Weight gain    HPI  [name] [name] comes in for evaluation and management his coronary artery disease.He is a former patient of Dr Verl Blalock. He received coronary stenting after a positive stress test in 2006 with improved fatigue and SOB. He is very active and asymptomatic. No complains.   He denies any angina or chest discomfort. He said orthopnea, PND or edema. He denies any symptoms of TIAs or mini strokes. He denies claudication. He continues to smoke but has cut way back he says. He seems to be compliant with his medication. He denies any presyncope or palpitations.   Home Medications  Prior to Admission medications   Medication Sig Start Date End Date Taking? Authorizing Provider  Ascorbic Acid (VITAMIN C) 1000 MG tablet Take 2,000 mg by mouth daily.   Yes Historical Provider, MD  atenolol (TENORMIN) 25 MG tablet Take 1 tablet (25 mg total) by mouth daily. 12/29/12  Yes [name], MD  atorvastatin (LIPITOR) 40 MG tablet TAKE 1 TABLET BY MOUTH EVERY DAY 05/27/13  Yes [name], MD  Cholecalciferol (VITAMIN D) 2000 UNITS tablet Take 2,000 Units by mouth daily.   Yes Historical Provider, MD  clomiPHENE (CLOMID) 50 MG tablet Take 0.5 tablets (25 mg total) by mouth daily. (Take 1/4 tablet daily) 06/17/13  Yes [name], MD  clopidogrel (PLAVIX) 75 MG tablet Take 1 tablet (75 mg total) by mouth daily. 07/26/13  Yes [name], MD  Coenzyme Q10 (CO Q 10) 100 MG CAPS Take 1 capsule by mouth daily.   Yes Historical Provider, MD  DHA-EPA-VIT B6-B12-FOLIC ACID PO Take by mouth.   Yes Historical Provider, MD  lisinopril-hydrochlorothiazide (PRINZIDE,ZESTORETIC) 10-12.5 MG per tablet Take 1 tablet by mouth daily. 01/13/13  Yes [name], MD  Magnesium 400 MG CAPS Take 1 capsule by mouth daily.   Yes Historical Provider, MD  meloxicam (MOBIC) 15 MG tablet as needed.  07/22/13  Yes Historical Provider, MD  nitroGLYCERIN (NITROSTAT) 0.4 MG SL tablet Place 0.4 mg under the tongue every 5 (five) minutes as needed. For chest pain 11/01/11 09/20/13 Yes Renella Cunas, MD  thiamine (VITAMIN B-1) 100 MG tablet Take 100 mg by mouth daily.   Yes Historical Provider, MD  vitamin B-12 (CYANOCOBALAMIN) 250 MCG tablet Take 250 mcg by  mouth daily.   Yes Historical Provider, MD  Zinc 50 MG TABS Take 50 mg by mouth daily.   Yes Historical Provider, MD    Family History  Family History  Problem Relation Age of Onset  . Cancer Mother     Colon Cancer-at advanced age  . Cancer Sister     Breast Cancer    Social History  History   Social History  . Marital Status: Single    Spouse Name: N/A    Number of Children: 0  . 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 Smoking status: Current Every Day Smoker -- 1.00 packs/day for 42 years    Types: Cigarettes  . Smokeless tobacco: Not on file  . Alcohol  Use: Yes     Comment: rare  . Drug Use: No  . Sexual Activity: Not on file   Other Topics Concern  . Not on file   Social History Narrative  . No narrative on file     Review of Systems, as per HPI, otherwise negative General:  No chills, fever, night sweats or weight changes.  Cardiovascular:  No chest pain, dyspnea on exertion, edema, orthopnea, palpitations, paroxysmal nocturnal dyspnea. Dermatological: No rash, lesions/masses Respiratory: No cough, dyspnea Urologic: No hematuria, dysuria Abdominal:   No nausea, vomiting, diarrhea, bright red blood per rectum, melena, or hematemesis Neurologic:  No visual changes, wkns, changes in mental status. All other systems reviewed and are otherwise negative except as noted above.  Physical Exam  Blood pressure 140/100, pulse 64, height 5' 8"  (1.727 m), weight 234 lb (106.142 kg).  General: Pleasant, NAD Psych: Normal affect. Neuro: Alert and oriented X 3. Moves all extremities spontaneously. HEENT: Normal  Neck: Supple without bruits or JVD. Lungs:  Resp regular and unlabored, CTA. Heart: RRR no s3, s4, or murmurs. Abdomen: Soft, non-tender, non-distended, BS + x 4.  Extremities: No clubbing, cyanosis or edema. DP/PT/Radials 2+ and equal bilaterally.  Labs:  No results found for this basename: CKTOTAL, CKMB, TROPONINI,  in the last 72 hours Lab Results  Component Value Date   WBC 12.3* 10/30/2012   HGB 10.8* 10/30/2012   HCT 32.5* 10/30/2012   MCV 96.4 10/30/2012   PLT 164 10/30/2012   No results found for this basename: NA, K, CL, CO2, BUN, CREATININE, CALCIUM, LABALBU, PROT, BILITOT, ALKPHOS, ALT, AST, GLUCOSE,  in the last 168 hours Lab Results  Component Value Date   CHOL 135 08/28/2012   HDL 43 08/28/2012   LDLCALC 76 08/28/2012   TRIG 80 08/28/2012   No results found for this basename: DDIMER   No components found with this basename: POCBNP,   Accessory Clinical Findings   ECG - SR, 64  BPM,  RBBB   Assessment & Plan  62 year old male   1. H/o CAD, s/p PCI (? Artery) in 2006, stable asymptomatic , continue ASA, Plavix, statin, BB. No need for further work up now.  2. Hypertension - uncontrolled - increase Lisinopril-HCHTZ 10-12.5 to 20-66m daily.  3. Hyperlipidemia - WNL at the last year appointment.  He is scheduled for lab work including BMP and lipid profile at his PCP office the next week, we will follow  4. Smoking cessation - counseled  Follow up in 1 year   NDorothy Spark MD, F[facility]11/08/2013, 3:13 PM

## 2013-09-26 ENCOUNTER — Other Ambulatory Visit: Payer: Self-pay | Admitting: Endocrinology

## 2013-09-27 ENCOUNTER — Encounter: Payer: Self-pay | Admitting: Endocrinology

## 2013-09-27 ENCOUNTER — Ambulatory Visit (INDEPENDENT_AMBULATORY_CARE_PROVIDER_SITE_OTHER): Payer: BC Managed Care – PPO | Admitting: Endocrinology

## 2013-09-27 ENCOUNTER — Other Ambulatory Visit: Payer: Self-pay | Admitting: *Deleted

## 2013-09-27 VITALS — BP 150/90 | HR 68 | Temp 97.9°F | Resp 16 | Ht 68.0 in | Wt 232.1 lb

## 2013-09-27 DIAGNOSIS — K7581 Nonalcoholic steatohepatitis (NASH): Secondary | ICD-10-CM

## 2013-09-27 DIAGNOSIS — I10 Essential (primary) hypertension: Secondary | ICD-10-CM

## 2013-09-27 DIAGNOSIS — R319 Hematuria, unspecified: Secondary | ICD-10-CM

## 2013-09-27 DIAGNOSIS — K7689 Other specified diseases of liver: Secondary | ICD-10-CM

## 2013-09-27 DIAGNOSIS — E119 Type 2 diabetes mellitus without complications: Secondary | ICD-10-CM

## 2013-09-27 DIAGNOSIS — E785 Hyperlipidemia, unspecified: Secondary | ICD-10-CM

## 2013-09-27 DIAGNOSIS — E291 Testicular hypofunction: Secondary | ICD-10-CM

## 2013-09-27 DIAGNOSIS — Z23 Encounter for immunization: Secondary | ICD-10-CM

## 2013-09-27 DIAGNOSIS — Z1211 Encounter for screening for malignant neoplasm of colon: Secondary | ICD-10-CM

## 2013-09-27 DIAGNOSIS — D509 Iron deficiency anemia, unspecified: Secondary | ICD-10-CM

## 2013-09-27 DIAGNOSIS — Z125 Encounter for screening for malignant neoplasm of prostate: Secondary | ICD-10-CM

## 2013-09-27 MED ORDER — CLOPIDOGREL BISULFATE 75 MG PO TABS: 75.0000 mg | ORAL_TABLET | Freq: Every day | ORAL | Status: DC

## 2013-09-27 MED ORDER — CLOMIPHENE CITRATE 50 MG PO TABS: ORAL_TABLET | ORAL | Status: DC

## 2013-09-27 NOTE — Patient Instructions (Addendum)
please consider these measures for your health:  minimize alcohol.  do not use tobacco products.  have a colonoscopy at least every 10 years from age 62.  keep firearms safely stored.  always use seat belts.  have working smoke alarms in your home.  see an eye doctor and dentist regularly.  never drive under the influence of alcohol or drugs (including prescription drugs).  those with fair skin should take precautions against the sun.   Please come back for a follow-up appointment in 1 year.

## 2013-09-27 NOTE — Progress Notes (Signed)
Subjective:    Patient ID: [name] [name], male    DOB: 07-09-1951, 62 y.o.   MRN: [id number]  HPI Pt is here for regular wellness examination, and is feeling pretty well in general, and says chronic med probs are stable, except as noted below He declines smoking cessation rx.  He is using electronic cigarettes to reduce smoking (currently 1 ppd). Past Medical History  Diagnosis Date  . CORONARY ARTERY DISEASE   . HYPERLIPIDEMIA   . DM   . ANEMIA-IRON DEFICIENCY   . GERD   . DIVERTICULOSIS, COLON   . BACK PAIN, LUMBAR   . Unspecified disorder of urethra and urinary tract   . HYPERTENSION     dr tom wall    Past Surgical History  Procedure Laterality Date  . Esophagogastroduodenoscopy  11/07/2005  . Stress cardiolite  11/30/2004  . Electrocardiogram  03/30/2007  . Coronary angioplasty with stent placement      06    dr t. wall  . Kidney stones    . Total hip arthroplasty  10/28/2012    Procedure: TOTAL HIP ARTHROPLASTY;  Surgeon: [name], MD;  Location: [place];  Service: Orthopedics;  Laterality: Right;    History   Social History  . Marital Status: Single    Spouse Name: N/A    Number of Children: 0  . 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 Smoking status: Current Every Day Smoker -- 1.00 packs/day for 42 years    Types: Cigarettes  . Smokeless tobacco: Not on file  . Alcohol Use: Yes     Comment: rare  . Drug Use: No  . Sexual Activity: Not on file   Other Topics Concern  . Not on file   Social History Narrative  . No narrative on file    Current Outpatient Prescriptions on File Prior to Visit  Medication Sig Dispense Refill  . Ascorbic Acid (VITAMIN C) 1000 MG tablet Take 2,000 mg by mouth daily.      Marland Kitchen atenolol (TENORMIN) 25 MG tablet Take 1 tablet (25 mg total) by mouth daily.  90 tablet  3  . atorvastatin (LIPITOR) 40 MG tablet TAKE 1 TABLET BY MOUTH EVERY DAY   30 tablet  3  . Cholecalciferol (VITAMIN D) 2000 UNITS tablet Take 2,000 Units by mouth daily.      . Coenzyme Q10 (CO Q 10) 100 MG CAPS Take 1 capsule by mouth daily.      . DHA-EPA-VIT B6-B12-FOLIC ACID PO Take by mouth.      Marland Kitchen lisinopril-hydrochlorothiazide (PRINZIDE,ZESTORETIC) 20-25 MG per tablet Take 1 tablet by mouth daily.  30 tablet  11  . Magnesium 400 MG CAPS Take 1 capsule by mouth daily.      . meloxicam (MOBIC) 15 MG tablet as needed.       . thiamine (VITAMIN B-1) 100 MG tablet Take 100 mg by mouth daily.      . vitamin B-12 (CYANOCOBALAMIN) 250 MCG tablet Take 250 mcg by mouth daily.      . Zinc 50 MG TABS Take 50 mg by mouth daily.      . nitroGLYCERIN (NITROSTAT) 0.4 MG SL tablet Place 0.4 mg under the tongue every 5 (five) minutes as needed. For chest pain       No current facility-administered medications on file prior to visit.   Allergies  Allergen  Reactions  . Actos [Pioglitazone Hydrochloride]     Weight gain   Family History  Problem Relation Age of Onset  . Cancer Mother     Colon Cancer-at advanced age  . Cancer Sister     Breast Cancer   BP 150/90  Pulse 68  Temp(Src) 97.9 F (36.6 C) (Oral)  Resp 16  Ht 5' 8"  (1.727 m)  Wt 232 lb 1.6 oz (105.28 kg)  BMI 35.30 kg/m2  Review of Systems  Constitutional: Negative for fever and unexpected weight change.  HENT: Negative for hearing loss.   Eyes: Negative for visual disturbance.  Respiratory: Negative for shortness of breath.   Cardiovascular: Negative for chest pain.  Gastrointestinal: Negative for blood in stool.  Endocrine: Negative for cold intolerance.  Genitourinary: Negative for hematuria and difficulty urinating.  Musculoskeletal: Negative for back pain.  Skin: Negative for rash.  Allergic/Immunologic: Negative for environmental allergies.  Neurological: Negative for syncope.  Hematological: Bruises/bleeds easily.  Psychiatric/Behavioral: Negative for dysphoric mood.       Objective:    Physical Exam VS: see vs page GEN: no distress HEAD: head: no deformity eyes: no periorbital swelling, no proptosis external nose and ears are normal mouth: no lesion seen NECK: supple, thyroid is not enlarged CHEST WALL: no deformity LUNGS: clear to auscultation BREASTS:  No gynecomastia CV: reg rate and rhythm, no murmur ABD: abdomen is soft, nontender.  no hepatosplenomegaly.  not distended.  no hernia  RECTAL: normal external and internal exam.  heme neg. PROSTATE:  Normal size.  No nodule MUSCULOSKELETAL: muscle bulk and strength are grossly normal.  no obvious joint swelling.  gait is normal and steady EXTEMITIES: no deformity.  no ulcer on the feet.  feet are of normal color and temp.  no edema PULSES: dorsalis pedis intact bilat.  no carotid bruit NEURO:  cn 2-12 grossly intact.   readily moves all 4's.  sensation is intact to touch on the feet SKIN:  Normal texture and temperature.  No rash or suspicious lesion is visible.   NODES:  None palpable at the neck PSYCH: alert, oriented x3.  Does not appear anxious nor depressed.    i reviewed electrocardiogram     Assessment & Plan:  Wellness visit today, with problems stable, except as noted.

## 2013-09-28 LAB — BASIC METABOLIC PANEL
BUN: 17 mg/dL (ref 6–23)
CO2: 31 mEq/L (ref 19–32)
Calcium: 10.8 mg/dL — ABNORMAL HIGH (ref 8.4–10.5)
Chloride: 106 mEq/L (ref 96–112)
Creatinine, Ser: 0.9 mg/dL (ref 0.4–1.5)
GFR: 94.67 mL/min (ref >60.00)
Glucose, Bld: 123 mg/dL — ABNORMAL HIGH (ref 70–99)
Potassium: 5.2 mEq/L — ABNORMAL HIGH (ref 3.5–5.1)
Sodium: 143 mEq/L (ref 135–145)

## 2013-09-28 LAB — MICROALBUMIN / CREATININE URINE RATIO
Creatinine,U: 153.5 mg/dL
Microalb Creat Ratio: 0.8 mg/g (ref 0.0–30.0)
Microalb, Ur: 1.3 mg/dL (ref 0.0–1.9)

## 2013-09-28 LAB — URINALYSIS, ROUTINE W REFLEX MICROSCOPIC
Bilirubin Urine: NEGATIVE
Hgb urine dipstick: NEGATIVE
Ketones, ur: NEGATIVE
Leukocytes, UA: NEGATIVE
Nitrite: NEGATIVE
Specific Gravity, Urine: >=1.03 (ref 1.000–1.030)
Total Protein, Urine: NEGATIVE
Urine Glucose: NEGATIVE
Urobilinogen, UA: 0.2 (ref 0.0–1.0)
pH: 5.5 (ref 5.0–8.0)

## 2013-09-28 LAB — HEPATIC FUNCTION PANEL
ALT: 46 U/L (ref 0–53)
AST: 44 U/L — ABNORMAL HIGH (ref 0–37)
Albumin: 4.3 g/dL (ref 3.5–5.2)
Alkaline Phosphatase: 61 U/L (ref 39–117)
Bilirubin, Direct: 0 mg/dL (ref 0.0–0.3)
Total Bilirubin: 0.5 mg/dL (ref 0.3–1.2)
Total Protein: 7.2 g/dL (ref 6.0–8.3)

## 2013-09-28 LAB — LIPID PANEL
Cholesterol: 123 mg/dL (ref 0–200)
HDL: 48.4 mg/dL (ref >39.00)
LDL Cholesterol: 61 mg/dL (ref 0–99)
Total CHOL/HDL Ratio: 3
Triglycerides: 66 mg/dL (ref 0.0–149.0)
VLDL: 13.2 mg/dL (ref 0.0–40.0)

## 2013-09-28 LAB — CBC WITH DIFFERENTIAL/PLATELET
Basophils Absolute: 0 10*3/uL (ref 0.0–0.1)
Basophils Relative: 0.4 % (ref 0.0–3.0)
Eosinophils Absolute: 0.1 10*3/uL (ref 0.0–0.7)
Eosinophils Relative: 0.8 % (ref 0.0–5.0)
HCT: 44.7 % (ref 39.0–52.0)
Hemoglobin: 14.6 g/dL (ref 13.0–17.0)
Lymphocytes Relative: 13.2 % (ref 12.0–46.0)
Lymphs Abs: 1.2 10*3/uL (ref 0.7–4.0)
MCHC: 32.7 g/dL (ref 30.0–36.0)
MCV: 80.3 fl (ref 78.0–100.0)
Monocytes Absolute: 0.6 10*3/uL (ref 0.1–1.0)
Monocytes Relative: 6.6 % (ref 3.0–12.0)
Neutro Abs: 7.3 10*3/uL (ref 1.4–7.7)
Neutrophils Relative %: 79 % — ABNORMAL HIGH (ref 43.0–77.0)
Platelets: 246 10*3/uL (ref 150.0–400.0)
RBC: 5.57 Mil/uL (ref 4.22–5.81)
RDW: 22.7 % — ABNORMAL HIGH (ref 11.5–14.6)
WBC: 9.3 10*3/uL (ref 4.5–10.5)

## 2013-09-28 LAB — TESTOSTERONE: Testosterone: 310.99 ng/dL — ABNORMAL LOW (ref 350.00–890.00)

## 2013-09-28 LAB — HEMOGLOBIN A1C: Hgb A1c MFr Bld: 6.7 % — ABNORMAL HIGH (ref 4.6–6.5)

## 2013-09-28 LAB — PSA: PSA: 0.62 ng/mL (ref 0.10–4.00)

## 2013-09-28 LAB — TSH: TSH: 1.96 u[IU]/mL (ref 0.35–5.50)

## 2013-09-29 ENCOUNTER — Encounter: Payer: Self-pay | Admitting: Cardiology

## 2013-09-29 ENCOUNTER — Encounter: Payer: Self-pay | Admitting: Endocrinology

## 2013-09-29 ENCOUNTER — Other Ambulatory Visit: Payer: Self-pay | Admitting: Endocrinology

## 2013-09-29 MED ORDER — CLOMIPHENE CITRATE 50 MG PO TABS: ORAL_TABLET | ORAL | Status: DC

## 2013-10-04 ENCOUNTER — Other Ambulatory Visit (INDEPENDENT_AMBULATORY_CARE_PROVIDER_SITE_OTHER): Payer: BC Managed Care – PPO

## 2013-10-04 ENCOUNTER — Other Ambulatory Visit: Payer: Self-pay | Admitting: Endocrinology

## 2013-10-04 LAB — BASIC METABOLIC PANEL
BUN: 15 mg/dL (ref 6–23)
CO2: 26 mEq/L (ref 19–32)
Calcium: 9.4 mg/dL (ref 8.4–10.5)
Chloride: 104 mEq/L (ref 96–112)
Creatinine, Ser: 0.8 mg/dL (ref 0.4–1.5)
GFR: 107.37 mL/min (ref >60.00)
Glucose, Bld: 150 mg/dL — ABNORMAL HIGH (ref 70–99)
Potassium: 4.1 mEq/L (ref 3.5–5.1)
Sodium: 137 mEq/L (ref 135–145)

## 2013-10-05 LAB — PTH, INTACT AND CALCIUM

## 2013-10-06 ENCOUNTER — Telehealth: Payer: Self-pay | Admitting: *Deleted

## 2013-10-06 NOTE — Telephone Encounter (Signed)
[facility] called, they wanted to let you know they unable to run the [facility] test because they did not receive a frozen specimen.

## 2013-10-24 ENCOUNTER — Other Ambulatory Visit: Payer: Self-pay | Admitting: Endocrinology

## 2013-11-26 ENCOUNTER — Other Ambulatory Visit: Payer: Self-pay | Admitting: Endocrinology

## 2013-12-27 ENCOUNTER — Other Ambulatory Visit: Payer: Self-pay

## 2013-12-27 MED ORDER — ATENOLOL 25 MG PO TABS: 25.0000 mg | ORAL_TABLET | Freq: Every day | ORAL | Status: DC

## 2014-01-27 ENCOUNTER — Telehealth: Payer: Self-pay | Admitting: Cardiology

## 2014-01-27 ENCOUNTER — Other Ambulatory Visit: Payer: Self-pay | Admitting: Endocrinology

## 2014-01-27 NOTE — Telephone Encounter (Signed)
New message    Form sent over from [facility] on yesterday   Need injection in lower spine- hernia disk.     Need to stop plavix  7 days & small dosage of ASA .

## 2014-01-27 NOTE — Telephone Encounter (Signed)
Spoke with pt, aware dr [name] is on vacation and will return to the office Tuesday. Will forward for her review

## 2014-01-27 NOTE — Telephone Encounter (Signed)
[name], It is ok for this patient to stop Plavix. If he wishes he doesn't need to restart after the procedure as his stent was placed over 8 years ago. Thank you, [name]

## 2014-01-28 NOTE — Telephone Encounter (Signed)
Left message for pt to call.

## 2014-01-28 NOTE — Telephone Encounter (Signed)
Follow up     Returning call back to nurse Luisa Dago.

## 2014-01-28 NOTE — Telephone Encounter (Signed)
Spoke with pt, aware of dr nelson's recommendations. This note will be faxed to the number provided.

## 2014-02-01 ENCOUNTER — Telehealth: Payer: Self-pay | Admitting: Cardiology

## 2014-02-01 ENCOUNTER — Encounter: Payer: Self-pay | Admitting: Cardiology

## 2014-02-01 NOTE — Telephone Encounter (Signed)
**Note De-Identified Larisha Vencill Obfuscation** Phone note from 3/19 faxed to Manuela Schwartz at [phone]

## 2014-02-01 NOTE — Telephone Encounter (Deleted)
[name] [name] is advised that at the pts last OV with Dr [name] on 09/20/13 the pt stated that he was taking Plavix and that if it has been stopped we are not aware. She states that she will call pt back to see which MD stopped his Plavix.

## 2014-02-01 NOTE — Telephone Encounter (Signed)
New message     Need documentation that pt is off plavix. Please fax to [phone].  Patient is scheduled for a procedure in april

## 2014-02-02 ENCOUNTER — Telehealth: Payer: Self-pay | Admitting: Cardiology

## 2014-02-02 NOTE — Telephone Encounter (Signed)
Received request from Nurse fax box, documents faxed for surgical clearance. To: [facility] Fax number: [phone] Attention: [id number]/[id number]

## 2014-04-28 ENCOUNTER — Other Ambulatory Visit: Payer: Self-pay | Admitting: Orthopaedic Surgery

## 2014-04-28 DIAGNOSIS — M5441 Lumbago with sciatica, right side: Secondary | ICD-10-CM

## 2014-05-01 ENCOUNTER — Other Ambulatory Visit: Payer: BC Managed Care – PPO

## 2014-05-05 ENCOUNTER — Encounter (HOSPITAL_COMMUNITY): Payer: Self-pay | Admitting: Pharmacy Technician

## 2014-05-05 ENCOUNTER — Other Ambulatory Visit (HOSPITAL_COMMUNITY): Payer: Self-pay | Admitting: Orthopaedic Surgery

## 2014-05-06 ENCOUNTER — Ambulatory Visit (HOSPITAL_COMMUNITY)
Admission: RE | Admit: 2014-05-06 | Discharge: 2014-05-06 | Disposition: A | Discharge status: Home / Self Care | Payer: BC Managed Care – PPO | Source: Ambulatory Visit | Attending: Orthopaedic Surgery | Admitting: Orthopaedic Surgery

## 2014-05-06 ENCOUNTER — Encounter (HOSPITAL_COMMUNITY)
Admission: RE | Admit: 2014-05-06 | Discharge: 2014-05-06 | Disposition: A | Discharge status: Home / Self Care | Payer: BC Managed Care – PPO | Source: Ambulatory Visit | Attending: Orthopaedic Surgery | Admitting: Orthopaedic Surgery

## 2014-05-06 ENCOUNTER — Other Ambulatory Visit (HOSPITAL_COMMUNITY): Payer: Self-pay | Admitting: *Deleted

## 2014-05-06 ENCOUNTER — Ambulatory Visit
Admission: RE | Admit: 2014-05-06 | Discharge: 2014-05-06 | Disposition: A | Discharge status: Home / Self Care | Payer: BC Managed Care – PPO | Source: Ambulatory Visit | Attending: Orthopaedic Surgery | Admitting: Orthopaedic Surgery

## 2014-05-06 ENCOUNTER — Encounter (HOSPITAL_COMMUNITY): Payer: Self-pay

## 2014-05-06 DIAGNOSIS — Z01818 Encounter for other preprocedural examination: Secondary | ICD-10-CM | POA: Insufficient documentation

## 2014-05-06 DIAGNOSIS — M5441 Lumbago with sciatica, right side: Secondary | ICD-10-CM

## 2014-05-06 HISTORY — DX: Inflammatory liver disease, unspecified: K75.9

## 2014-05-06 HISTORY — DX: Other specified postprocedural states: Z98.890

## 2014-05-06 HISTORY — DX: Unspecified osteoarthritis, unspecified site: M19.90

## 2014-05-06 HISTORY — DX: Chronic kidney disease, unspecified: N18.9

## 2014-05-06 HISTORY — DX: Other specified postprocedural states: R11.2

## 2014-05-06 HISTORY — DX: Nausea with vomiting, unspecified: R11.2

## 2014-05-06 LAB — SURGICAL PCR SCREEN
MRSA, PCR: NEGATIVE
Staphylococcus aureus: NEGATIVE

## 2014-05-06 LAB — COMPREHENSIVE METABOLIC PANEL
ALT: 55 U/L — ABNORMAL HIGH (ref 0–53)
AST: 48 U/L — ABNORMAL HIGH (ref 0–37)
Albumin: 3.9 g/dL (ref 3.5–5.2)
Alkaline Phosphatase: 71 U/L (ref 39–117)
BUN: 13 mg/dL (ref 6–23)
CO2: 24 mEq/L (ref 19–32)
Calcium: 9.5 mg/dL (ref 8.4–10.5)
Chloride: 101 mEq/L (ref 96–112)
Creatinine, Ser: 0.69 mg/dL (ref 0.50–1.35)
GFR calc Af Amer: >90 mL/min (ref >90)
GFR calc non Af Amer: >90 mL/min (ref >90)
Glucose, Bld: 147 mg/dL — ABNORMAL HIGH (ref 70–99)
Potassium: 4.7 mEq/L (ref 3.7–5.3)
Sodium: 140 mEq/L (ref 137–147)
Total Bilirubin: <0.2 mg/dL — ABNORMAL LOW (ref 0.3–1.2)
Total Protein: 7 g/dL (ref 6.0–8.3)

## 2014-05-06 LAB — PROTIME-INR
INR: 0.97 (ref 0.00–1.49)
Prothrombin Time: 12.9 seconds (ref 11.6–15.2)

## 2014-05-06 LAB — CBC
HCT: 46 % (ref 39.0–52.0)
Hemoglobin: 15.1 g/dL (ref 13.0–17.0)
MCH: 30.7 pg (ref 26.0–34.0)
MCHC: 32.8 g/dL (ref 30.0–36.0)
MCV: 93.5 fL (ref 78.0–100.0)
Platelets: 193 10*3/uL (ref 150–400)
RBC: 4.92 MIL/uL (ref 4.22–5.81)
RDW: 16.3 % — ABNORMAL HIGH (ref 11.5–15.5)
WBC: 7.8 10*3/uL (ref 4.0–10.5)

## 2014-05-06 MED ORDER — GADOBENATE DIMEGLUMINE 529 MG/ML IV SOLN: 20.0000 mL | Freq: Once | INTRAVENOUS | Status: AC | PRN

## 2014-05-06 NOTE — Progress Notes (Signed)
Pt's PCP is Dr. [name]. Pt's cardiologist is Dr. Arnoldo Hooker with [facility]. Last visit with [name] in November, 2014. Pt states he's not had any heart issues (chest pain/sob) since having a stent placed in 2006. I asked pt if Dr. Lorin Mercy had said anything to him about needing a cardiac clearance and pt stated that he had not.

## 2014-05-06 NOTE — Pre-Procedure Instructions (Signed)
JEFFERSON FULLAM  05/06/2014   Your procedure is scheduled on:  Wednesday, May 19, 2014 at 12:30 PM.   Report to [facility] Entrance "A" Admitting Office at 10:30 AM.   Call this number if you have problems the morning of surgery: [phone]   Remember:   Do not eat food or drink liquids after midnight Tuesday, 05/17/14.   Take these medicines the morning of surgery with A SIP OF WATER: atenolol (TENORMIN), nitroGLYCERIN (NITROSTAT) - if needed.  Stop Vitamins, Herbal Medications and NSAIDs (Mobic, Ibuprofen, Aleve, etc.) as of 05/11/14.    Do not wear jewelry.  Do not wear lotions, powders, or cologne. You may wear deodorant.  Men may shave face and neck.  Do not bring valuables to the hospital.  [facility] is not responsible                  for any belongings or valuables.               Contacts, dentures or bridgework may not be worn into surgery.  Leave suitcase in the car. After surgery it may be brought to your room.  For patients admitted to the hospital, discharge time is determined by your                treatment team.         Special Instructions: [place] - Preparing for Surgery  Before surgery, you can play an important role.  Because skin is not sterile, your skin needs to be as free of germs as possible.  You can reduce the number of germs on you skin by washing with CHG (chlorahexidine gluconate) soap before surgery.  CHG is an antiseptic cleaner which kills germs and bonds with the skin to continue killing germs even after washing.  Please DO NOT use if you have an allergy to CHG or antibacterial soaps.  If your skin becomes reddened/irritated stop using the CHG and inform your nurse when you arrive at Short Stay.  Do not shave (including legs and underarms) for at least 48 hours prior to the first CHG shower.  You may shave your face.  Please follow these instructions carefully:   1.  Shower with CHG Soap the night before surgery and the                                 morning of Surgery.  2.  If you choose to wash your hair, wash your hair first as usual with your       normal shampoo.  3.  After you shampoo, rinse your hair and body thoroughly to remove the                      Shampoo.  4.  Use CHG as you would any other liquid soap.  You can apply chg directly       to the skin and wash gently with scrungie or a clean washcloth.  5.  Apply the CHG Soap to your body ONLY FROM THE NECK DOWN.        Do not use on open wounds or open sores.  Avoid contact with your eyes, ears, mouth and genitals (private parts).  Wash genitals (private parts) with your normal soap.  6.  Wash thoroughly, paying special attention to the area where your surgery  will be performed.  7.  Thoroughly rinse your body with warm water from the neck down.  8.  DO NOT shower/wash with your normal soap after using and rinsing off       the CHG Soap.  9.  Pat yourself dry with a clean towel.            10.  Wear clean pajamas.            11.  Place clean sheets on your bed the night of your first shower and do not        sleep with pets.  Day of Surgery  Do not apply any lotions the morning of surgery.  Please wear clean clothes to the hospital/surgery center.     Please read over the following fact sheets that you were given: Pain Booklet, Coughing and Deep Breathing, MRSA Information and Surgical Site Infection Prevention

## 2014-05-06 NOTE — Pre-Procedure Instructions (Signed)
[name] [name]  05/06/2014   Your procedure is scheduled on:  Wednesday, May 18, 2014 at 12:30 PM.   Report to [facility] Entrance "A" Admitting Office at 10:30 AM.   Call this number if you have problems the morning of surgery: [phone]   Remember:   Do not eat food or drink liquids after midnight Tuesday, 05/17/14.   Take these medicines the morning of surgery with A SIP OF WATER: atenolol (TENORMIN), nitroGLYCERIN (NITROSTAT) - if needed.  Stop Vitamins, Herbal Medications and NSAIDs (Mobic, Ibuprofen, Aleve, etc.) as of 05/11/14.    Do not wear jewelry.  Do not wear lotions, powders, or cologne. You may wear deodorant.  Men may shave face and neck.  Do not bring valuables to the hospital.  [facility] is not responsible                  for any belongings or valuables.               Contacts, dentures or bridgework may not be worn into surgery.  Leave suitcase in the car. After surgery it may be brought to your room.  For patients admitted to the hospital, discharge time is determined by your                treatment team.         Special Instructions: [place] - Preparing for Surgery  Before surgery, you can play an important role.  Because skin is not sterile, your skin needs to be as free of germs as possible.  You can reduce the number of germs on you skin by washing with CHG (chlorahexidine gluconate) soap before surgery.  CHG is an antiseptic cleaner which kills germs and bonds with the skin to continue killing germs even after washing.  Please DO NOT use if you have an allergy to CHG or antibacterial soaps.  If your skin becomes reddened/irritated stop using the CHG and inform your nurse when you arrive at Short Stay.  Do not shave (including legs and underarms) for at least 48 hours prior to the first CHG shower.  You may shave your face.  Please follow these instructions carefully:   1.  Shower with CHG Soap the night before surgery and the                                 morning of Surgery.  2.  If you choose to wash your hair, wash your hair first as usual with your       normal shampoo.  3.  After you shampoo, rinse your hair and body thoroughly to remove the                      Shampoo.  4.  Use CHG as you would any other liquid soap.  You can apply chg directly       to the skin and wash gently with scrungie or a clean washcloth.  5.  Apply the CHG Soap to your body ONLY FROM THE NECK DOWN.        Do not use on open wounds or open sores.  Avoid contact with your eyes, ears, mouth and genitals (private parts).  Wash genitals (private parts) with your normal soap.  6.  Wash thoroughly, paying special attention to the area where your surgery  will be performed.  7.  Thoroughly rinse your body with warm water from the neck down.  8.  DO NOT shower/wash with your normal soap after using and rinsing off       the CHG Soap.  9.  Pat yourself dry with a clean towel.            10.  Wear clean pajamas.            11.  Place clean sheets on your bed the night of your first shower and do not        sleep with pets.  Day of Surgery  Do not apply any lotions the morning of surgery.  Please wear clean clothes to the hospital/surgery center.     Please read over the following fact sheets that you were given: Pain Booklet, Coughing and Deep Breathing, MRSA Information and Surgical Site Infection Prevention

## 2014-05-06 NOTE — Progress Notes (Signed)
05/06/14 1520  OBSTRUCTIVE SLEEP APNEA  Have you ever been diagnosed with sleep apnea through a sleep study? No  Do you snore loudly (loud enough to be heard through closed doors)?  0  Do you often feel tired, fatigued, or sleepy during the daytime? 0  Has anyone observed you stop breathing during your sleep? 0  Do you have, or are you being treated for high blood pressure? 1  BMI more than 35 kg/m2? 0  Age over 62 years old? 1  Neck circumference greater than 40 cm/16 inches? 1  Gender: 1  Obstructive Sleep Apnea Score 4  Score 4 or greater  Results sent to PCP

## 2014-05-07 ENCOUNTER — Encounter: Payer: Self-pay | Admitting: Cardiology

## 2014-05-09 ENCOUNTER — Telehealth: Payer: Self-pay | Admitting: *Deleted

## 2014-05-09 DIAGNOSIS — I2510 Atherosclerotic heart disease of native coronary artery without angina pectoris: Secondary | ICD-10-CM

## 2014-05-09 DIAGNOSIS — E785 Hyperlipidemia, unspecified: Secondary | ICD-10-CM

## 2014-05-09 DIAGNOSIS — I10 Essential (primary) hypertension: Secondary | ICD-10-CM

## 2014-05-09 NOTE — Telephone Encounter (Signed)
Left you a voice mail about Dr [name] wanting to schedule you to come in for a CMP and lipids lab work. Also Dr [name], wanted to inform  you that stent cards are ususlly given at the time of stent placement, and yours was done in 2006. Dr [name] stated we can print you a note that you have a Zomax study stent in the proximal LAD.

## 2014-05-09 NOTE — Progress Notes (Signed)
Anesthesia Chart Review:  Patient is a 63 year old male scheduled for right L5-S1 far lateral microdiscectomy on 05/18/14 by Dr. Lorin Mercy.    History includes CAD s/p DES LAD 12/19/04, HLD, GERD, iron deficiency anemia, DM2, HTN, CKD, arthritis, hepatitis B antibodies, nephrolithiasis, diverticulosis, smoking, post-operative N/V, right THA '13.  BMI is consistent with obesity.  PCP is Dr. [name]. Cardiologist is Dr. [name], last visit 09/2013. No further work-up was recommended at that time. She told him he could stop Plavix back in 01/2014 since his stent was > 8 years ago. According to his PAT RN, he denied any SOB/chest pain at his PAT visit.  EKG on 09/20/13 showed NSR, right BBB (old).  He had a normal stress echo on 08/18/2009.  His last cath on 09/27/05 showed: Nonobstructive coronary artery disease with 0% stenosis in the Zomax study stent in the proximal LAD, 30% stenosis in the mid left anterior descending artery, 40% narrowing in the marginal branch of the circumflex artery, and total occlusion of a small right coronary artery (unchanged from 12/2004 cath) with good left ventricular function, EF 60%. Continued medical therapy was recommended.  He had a negative 2V CXR on 05/06/14. Preoperative labs noted. Glucose 147.  AST/ALT 48/55.  H/H 15.1/46.0.   I reviewed above with anesthesiologist Dr. Marcie Bal.  Patient was seen by his cardiologist six months ago with no new testing ordered.  He denied any new CV symptoms at PAT. If no acute changes or new CV symptoms then it is anticipated that he can proceed as planned.  [facility] Short Stay Center/Anesthesiology Phone [phone] 05/09/2014 6:59 PM

## 2014-05-09 NOTE — Telephone Encounter (Signed)
LMCTB about Dr [name] wanting to schedule this pt for a CMP and lipids. Also Dr [name], she wanted to inform this pt that stent cards are ususlly given at the time of stent placement, his was done in 2006. Dr [name] stated we can print him a note that he has a Zomax study stent in the proximal LAD.

## 2014-05-10 ENCOUNTER — Encounter: Payer: Self-pay | Admitting: *Deleted

## 2014-05-10 NOTE — Telephone Encounter (Signed)
Pt was notified about having his requested labs (cmp and lipids) done tomorrow.  Also informed pt that Dr [name] stated he should have received a stent card back in 2006, but we can write him a letter stating he had a Zomax study stent in the proximal LAD.  Pt verbalized understanding and agrees to this plan and states he will pick this up tomorrow at scheduled lab appt.  Letter written and will be available for pick-up at the front we pt comes to lab on 7/1.

## 2014-05-10 NOTE — Telephone Encounter (Signed)
F/u ° ° °Pt returning your call °

## 2014-05-10 NOTE — Telephone Encounter (Signed)
LMTCB about needing to have cmp and lipids scheduled per Dr [name] and to inform about stent card info.

## 2014-05-11 ENCOUNTER — Other Ambulatory Visit (INDEPENDENT_AMBULATORY_CARE_PROVIDER_SITE_OTHER): Payer: BC Managed Care – PPO

## 2014-05-11 DIAGNOSIS — I10 Essential (primary) hypertension: Secondary | ICD-10-CM

## 2014-05-11 DIAGNOSIS — I2510 Atherosclerotic heart disease of native coronary artery without angina pectoris: Secondary | ICD-10-CM

## 2014-05-11 DIAGNOSIS — E785 Hyperlipidemia, unspecified: Secondary | ICD-10-CM

## 2014-05-12 ENCOUNTER — Telehealth: Payer: Self-pay | Admitting: *Deleted

## 2014-05-12 ENCOUNTER — Encounter: Payer: Self-pay | Admitting: Cardiology

## 2014-05-12 LAB — LIPID PANEL
Cholesterol: 123 mg/dL (ref 0–200)
HDL: 50.9 mg/dL (ref >39.00)
LDL Cholesterol: 56 mg/dL (ref 0–99)
NonHDL: 72.1
Total CHOL/HDL Ratio: 2
Triglycerides: 79 mg/dL (ref 0.0–149.0)
VLDL: 15.8 mg/dL (ref 0.0–40.0)

## 2014-05-12 LAB — COMPREHENSIVE METABOLIC PANEL
ALT: 56 U/L — ABNORMAL HIGH (ref 0–53)
AST: 55 U/L — ABNORMAL HIGH (ref 0–37)
Albumin: 4.3 g/dL (ref 3.5–5.2)
Alkaline Phosphatase: 60 U/L (ref 39–117)
BUN: 17 mg/dL (ref 6–23)
CO2: 29 mEq/L (ref 19–32)
Calcium: 9.5 mg/dL (ref 8.4–10.5)
Chloride: 107 mEq/L (ref 96–112)
Creatinine, Ser: 0.9 mg/dL (ref 0.4–1.5)
GFR: 92.03 mL/min (ref >60.00)
Glucose, Bld: 114 mg/dL — ABNORMAL HIGH (ref 70–99)
Potassium: 4.4 mEq/L (ref 3.5–5.1)
Sodium: 141 mEq/L (ref 135–145)
Total Bilirubin: 0.6 mg/dL (ref 0.2–1.2)
Total Protein: 7.1 g/dL (ref 6.0–8.3)

## 2014-05-12 NOTE — Telephone Encounter (Signed)
Message copied by Nuala Alpha on Thu May 12, 2014  5:57 PM ------      Message from: Dorothy Spark      Created: Thu May 12, 2014  4:28 PM       All labs normal other than minimal elevation of his liver enzymes. Please asking if he has been involved in any alcohol drinking lately. Also asking if he has been experiencing any muscle pain and if he answered no to both of those he should continue the same dose of atorvastatin. ------

## 2014-05-12 NOTE — Telephone Encounter (Signed)
  lmtcd about labs and Dr Francesca Oman recommendations  All labs normal other than minimal elevation of his liver enzymes. Please asking if he has been involved in any alcohol drinking lately. Also asking if he has been experiencing any muscle pain and if he answered no to both of those he should continue the same dose of atorvastatin this is per Dr [name].

## 2014-05-17 ENCOUNTER — Telehealth: Payer: Self-pay | Admitting: *Deleted

## 2014-05-17 MED ORDER — CEFAZOLIN SODIUM-DEXTROSE 2-3 GM-% IV SOLR
2.0000 g | INTRAVENOUS | Status: AC
  Administered 2014-05-18: 2 g via INTRAVENOUS
  Filled 2014-05-17: qty 50

## 2014-05-17 NOTE — Telephone Encounter (Signed)
Pt was contacted about all labs were normal except for noted mildly elevated liver enzymes per Dr [name].  Per Dr [name] I asked this pt if he was drinking alcohol daily and he stated "no." Also per Dr [name], I asked this pt if he was experiencing any muscle pain, and he stated "no." Per Dr [name] I instructed this pt to continue taking his same dose of Atorvastatin daily. Pt verbalized understanding and agrees with this plan

## 2014-05-17 NOTE — Telephone Encounter (Signed)
Message copied by Nuala Alpha on Tue May 17, 2014  9:26 AM ------      Message from: Dorothy Spark      Created: Thu May 12, 2014  4:28 PM       All labs normal other than minimal elevation of his liver enzymes. Please asking if he has been involved in any alcohol drinking lately. Also asking if he has been experiencing any muscle pain and if he answered no to both of those he should continue the same dose of atorvastatin. ------

## 2014-05-18 ENCOUNTER — Observation Stay (HOSPITAL_COMMUNITY)
Admission: RE | Admit: 2014-05-18 | Discharge: 2014-05-19 | Disposition: A | Discharge status: Home / Self Care | Payer: BC Managed Care – PPO | Source: Ambulatory Visit | Attending: Orthopaedic Surgery | Admitting: Orthopaedic Surgery

## 2014-05-18 ENCOUNTER — Encounter (HOSPITAL_COMMUNITY): Payer: Self-pay | Admitting: Anesthesiology

## 2014-05-18 ENCOUNTER — Encounter (HOSPITAL_COMMUNITY): Payer: BC Managed Care – PPO | Admitting: Vascular Surgery

## 2014-05-18 ENCOUNTER — Ambulatory Visit (HOSPITAL_COMMUNITY): Payer: BC Managed Care – PPO

## 2014-05-18 ENCOUNTER — Encounter (HOSPITAL_COMMUNITY)
Admission: RE | Disposition: A | Discharge status: Home / Self Care | Payer: Self-pay | Source: Ambulatory Visit | Attending: Orthopaedic Surgery

## 2014-05-18 ENCOUNTER — Ambulatory Visit (HOSPITAL_COMMUNITY): Payer: BC Managed Care – PPO | Admitting: Anesthesiology

## 2014-05-18 DIAGNOSIS — E119 Type 2 diabetes mellitus without complications: Secondary | ICD-10-CM | POA: Insufficient documentation

## 2014-05-18 DIAGNOSIS — M5126 Other intervertebral disc displacement, lumbar region: Principal | ICD-10-CM | POA: Diagnosis present

## 2014-05-18 DIAGNOSIS — F17200 Nicotine dependence, unspecified, uncomplicated: Secondary | ICD-10-CM | POA: Insufficient documentation

## 2014-05-18 DIAGNOSIS — K5730 Diverticulosis of large intestine without perforation or abscess without bleeding: Secondary | ICD-10-CM | POA: Insufficient documentation

## 2014-05-18 DIAGNOSIS — I2510 Atherosclerotic heart disease of native coronary artery without angina pectoris: Secondary | ICD-10-CM | POA: Insufficient documentation

## 2014-05-18 DIAGNOSIS — K219 Gastro-esophageal reflux disease without esophagitis: Secondary | ICD-10-CM | POA: Insufficient documentation

## 2014-05-18 DIAGNOSIS — Z96649 Presence of unspecified artificial hip joint: Secondary | ICD-10-CM | POA: Insufficient documentation

## 2014-05-18 DIAGNOSIS — E669 Obesity, unspecified: Secondary | ICD-10-CM | POA: Insufficient documentation

## 2014-05-18 DIAGNOSIS — Z9861 Coronary angioplasty status: Secondary | ICD-10-CM | POA: Insufficient documentation

## 2014-05-18 DIAGNOSIS — K759 Inflammatory liver disease, unspecified: Secondary | ICD-10-CM | POA: Insufficient documentation

## 2014-05-18 DIAGNOSIS — I10 Essential (primary) hypertension: Secondary | ICD-10-CM | POA: Insufficient documentation

## 2014-05-18 DIAGNOSIS — E785 Hyperlipidemia, unspecified: Secondary | ICD-10-CM | POA: Insufficient documentation

## 2014-05-18 HISTORY — PX: LUMBAR LAMINECTOMY: SHX95

## 2014-05-18 LAB — GLUCOSE, CAPILLARY
Glucose-Capillary: 135 mg/dL — ABNORMAL HIGH (ref 70–99)
Glucose-Capillary: 152 mg/dL — ABNORMAL HIGH (ref 70–99)
Glucose-Capillary: 188 mg/dL — ABNORMAL HIGH (ref 70–99)

## 2014-05-18 SURGERY — MICRODISCECTOMY LUMBAR LAMINECTOMY
Anesthesia: General | Site: Back | Laterality: Right

## 2014-05-18 MED ORDER — EPHEDRINE SULFATE 50 MG/ML IJ SOLN
INTRAMUSCULAR | Status: DC | PRN
  Administered 2014-05-18 (×2): 5 mg via INTRAVENOUS

## 2014-05-18 MED ORDER — LIDOCAINE HCL (CARDIAC) 20 MG/ML IV SOLN
INTRAVENOUS | Status: DC | PRN
  Administered 2014-05-18: 100 mg via INTRAVENOUS

## 2014-05-18 MED ORDER — DOCUSATE SODIUM 100 MG PO CAPS
100.0000 mg | ORAL_CAPSULE | Freq: Two times a day (BID) | ORAL | Status: DC
  Administered 2014-05-18: 100 mg via ORAL
  Filled 2014-05-18 (×2): qty 1

## 2014-05-18 MED ORDER — METHOCARBAMOL 1000 MG/10ML IJ SOLN
500.0000 mg | Freq: Four times a day (QID) | INTRAVENOUS | Status: DC | PRN
  Filled 2014-05-18: qty 5

## 2014-05-18 MED ORDER — OXYCODONE-ACETAMINOPHEN 5-325 MG PO TABS: 1.0000 | ORAL_TABLET | ORAL | Status: DC | PRN

## 2014-05-18 MED ORDER — MENTHOL 3 MG MT LOZG: 1.0000 | LOZENGE | OROMUCOSAL | Status: DC | PRN

## 2014-05-18 MED ORDER — PHENOL 1.4 % MT LIQD: 1.0000 | OROMUCOSAL | Status: DC | PRN

## 2014-05-18 MED ORDER — MIDAZOLAM HCL 2 MG/2ML IJ SOLN
INTRAMUSCULAR | Status: AC
  Filled 2014-05-18: qty 2

## 2014-05-18 MED ORDER — ATORVASTATIN CALCIUM 40 MG PO TABS
40.0000 mg | ORAL_TABLET | Freq: Every day | ORAL | Status: DC
  Administered 2014-05-18: 40 mg via ORAL
  Filled 2014-05-18 (×2): qty 1

## 2014-05-18 MED ORDER — HYDROCHLOROTHIAZIDE 25 MG PO TABS
25.0000 mg | ORAL_TABLET | Freq: Every day | ORAL | Status: DC
  Administered 2014-05-18: 25 mg via ORAL
  Filled 2014-05-18 (×2): qty 1

## 2014-05-18 MED ORDER — SODIUM CHLORIDE 0.9 % IJ SOLN: 3.0000 mL | Freq: Two times a day (BID) | INTRAMUSCULAR | Status: DC

## 2014-05-18 MED ORDER — METHOCARBAMOL 500 MG PO TABS: 500.0000 mg | ORAL_TABLET | Freq: Four times a day (QID) | ORAL | Status: DC | PRN

## 2014-05-18 MED ORDER — ROCURONIUM BROMIDE 100 MG/10ML IV SOLN
INTRAVENOUS | Status: DC | PRN
  Administered 2014-05-18: 40 mg via INTRAVENOUS
  Administered 2014-05-18: 10 mg via INTRAVENOUS

## 2014-05-18 MED ORDER — ROCURONIUM BROMIDE 50 MG/5ML IV SOLN
INTRAVENOUS | Status: AC
  Filled 2014-05-18: qty 1

## 2014-05-18 MED ORDER — CEFAZOLIN SODIUM 1-5 GM-% IV SOLN
1.0000 g | Freq: Three times a day (TID) | INTRAVENOUS | Status: AC
  Administered 2014-05-18 – 2014-05-19 (×2): 1 g via INTRAVENOUS
  Filled 2014-05-18 (×2): qty 50

## 2014-05-18 MED ORDER — HYDROMORPHONE HCL PF 1 MG/ML IJ SOLN
INTRAMUSCULAR | Status: AC
  Filled 2014-05-18: qty 1

## 2014-05-18 MED ORDER — BISACODYL 10 MG RE SUPP: 10.0000 mg | Freq: Every day | RECTAL | Status: DC | PRN

## 2014-05-18 MED ORDER — 0.9 % SODIUM CHLORIDE (POUR BTL) OPTIME
TOPICAL | Status: DC | PRN
  Administered 2014-05-18: 1000 mL

## 2014-05-18 MED ORDER — ACETAMINOPHEN 325 MG PO TABS: 650.0000 mg | ORAL_TABLET | ORAL | Status: DC | PRN

## 2014-05-18 MED ORDER — ACETAMINOPHEN 650 MG RE SUPP: 650.0000 mg | RECTAL | Status: DC | PRN

## 2014-05-18 MED ORDER — FENTANYL CITRATE 0.05 MG/ML IJ SOLN
INTRAMUSCULAR | Status: DC | PRN
  Administered 2014-05-18: 50 ug via INTRAVENOUS
  Administered 2014-05-18: 100 ug via INTRAVENOUS
  Administered 2014-05-18 (×2): 50 ug via INTRAVENOUS

## 2014-05-18 MED ORDER — MIDAZOLAM HCL 5 MG/5ML IJ SOLN
INTRAMUSCULAR | Status: DC | PRN
  Administered 2014-05-18: 2 mg via INTRAVENOUS

## 2014-05-18 MED ORDER — FENTANYL CITRATE 0.05 MG/ML IJ SOLN
INTRAMUSCULAR | Status: AC
  Filled 2014-05-18: qty 5

## 2014-05-18 MED ORDER — MORPHINE SULFATE 2 MG/ML IJ SOLN
1.0000 mg | INTRAMUSCULAR | Status: DC | PRN
  Administered 2014-05-18 – 2014-05-19 (×2): 2 mg via INTRAVENOUS
  Filled 2014-05-18 (×2): qty 1

## 2014-05-18 MED ORDER — FLEET ENEMA 7-19 GM/118ML RE ENEM: 1.0000 | ENEMA | Freq: Once | RECTAL | Status: AC | PRN

## 2014-05-18 MED ORDER — SENNOSIDES-DOCUSATE SODIUM 8.6-50 MG PO TABS: 1.0000 | ORAL_TABLET | Freq: Every evening | ORAL | Status: DC | PRN

## 2014-05-18 MED ORDER — HYDROCODONE-ACETAMINOPHEN 5-325 MG PO TABS: 1.0000 | ORAL_TABLET | ORAL | Status: DC | PRN

## 2014-05-18 MED ORDER — DEXAMETHASONE SODIUM PHOSPHATE 4 MG/ML IJ SOLN
INTRAMUSCULAR | Status: AC
  Filled 2014-05-18: qty 2

## 2014-05-18 MED ORDER — SODIUM CHLORIDE 0.45 % IV SOLN
INTRAVENOUS | Status: DC
  Administered 2014-05-19: 03:00:00 via INTRAVENOUS

## 2014-05-18 MED ORDER — ONDANSETRON HCL 4 MG/2ML IJ SOLN: 4.0000 mg | INTRAMUSCULAR | Status: DC | PRN

## 2014-05-18 MED ORDER — PANTOPRAZOLE SODIUM 40 MG IV SOLR
40.0000 mg | Freq: Every day | INTRAVENOUS | Status: DC
  Administered 2014-05-18: 40 mg via INTRAVENOUS
  Filled 2014-05-18 (×2): qty 40

## 2014-05-18 MED ORDER — METHOCARBAMOL 500 MG PO TABS
500.0000 mg | ORAL_TABLET | Freq: Four times a day (QID) | ORAL | Status: DC | PRN
  Administered 2014-05-18: 500 mg via ORAL
  Filled 2014-05-18: qty 1

## 2014-05-18 MED ORDER — KETOROLAC TROMETHAMINE 30 MG/ML IJ SOLN
INTRAMUSCULAR | Status: AC
  Filled 2014-05-18: qty 1

## 2014-05-18 MED ORDER — INSULIN ASPART 100 UNIT/ML ~~LOC~~ SOLN: 0.0000 [IU] | Freq: Three times a day (TID) | SUBCUTANEOUS | Status: DC

## 2014-05-18 MED ORDER — NEOSTIGMINE METHYLSULFATE 10 MG/10ML IV SOLN
INTRAVENOUS | Status: DC | PRN
  Administered 2014-05-18: 2 mg via INTRAVENOUS

## 2014-05-18 MED ORDER — LISINOPRIL-HYDROCHLOROTHIAZIDE 20-25 MG PO TABS: 1.0000 | ORAL_TABLET | Freq: Every day | ORAL | Status: DC

## 2014-05-18 MED ORDER — SODIUM CHLORIDE 0.9 % IJ SOLN: 3.0000 mL | INTRAMUSCULAR | Status: DC | PRN

## 2014-05-18 MED ORDER — ATENOLOL 25 MG PO TABS
25.0000 mg | ORAL_TABLET | Freq: Every day | ORAL | Status: DC
  Filled 2014-05-18: qty 1

## 2014-05-18 MED ORDER — ONDANSETRON HCL 4 MG/2ML IJ SOLN
INTRAMUSCULAR | Status: AC
  Filled 2014-05-18: qty 2

## 2014-05-18 MED ORDER — SODIUM CHLORIDE 0.9 % IV SOLN: 250.0000 mL | INTRAVENOUS | Status: DC

## 2014-05-18 MED ORDER — LISINOPRIL 20 MG PO TABS
20.0000 mg | ORAL_TABLET | Freq: Every day | ORAL | Status: DC
  Administered 2014-05-18: 20 mg via ORAL
  Filled 2014-05-18 (×2): qty 1

## 2014-05-18 MED ORDER — LACTATED RINGERS IV SOLN
INTRAVENOUS | Status: DC
Start: 2014-05-18 — End: 2014-05-18
  Administered 2014-05-18: 11:00:00 via INTRAVENOUS

## 2014-05-18 MED ORDER — LIDOCAINE HCL (CARDIAC) 20 MG/ML IV SOLN
INTRAVENOUS | Status: AC
  Filled 2014-05-18: qty 5

## 2014-05-18 MED ORDER — LACTATED RINGERS IV SOLN
INTRAVENOUS | Status: DC | PRN
  Administered 2014-05-18 (×2): via INTRAVENOUS

## 2014-05-18 MED ORDER — NITROGLYCERIN 0.4 MG SL SUBL: 0.4000 mg | SUBLINGUAL_TABLET | SUBLINGUAL | Status: DC | PRN

## 2014-05-18 MED ORDER — PROPOFOL 10 MG/ML IV BOLUS
INTRAVENOUS | Status: DC | PRN
  Administered 2014-05-18: 150 mg via INTRAVENOUS

## 2014-05-18 MED ORDER — PROPOFOL 10 MG/ML IV BOLUS
INTRAVENOUS | Status: AC
  Filled 2014-05-18: qty 20

## 2014-05-18 MED ORDER — HYDROMORPHONE HCL PF 1 MG/ML IJ SOLN
0.2500 mg | INTRAMUSCULAR | Status: DC | PRN
  Administered 2014-05-18 (×2): 0.5 mg via INTRAVENOUS

## 2014-05-18 MED ORDER — KCL IN DEXTROSE-NACL 20-5-0.45 MEQ/L-%-% IV SOLN: INTRAVENOUS | Status: DC

## 2014-05-18 MED ORDER — CLOMIPHENE CITRATE 50 MG PO TABS
25.0000 mg | ORAL_TABLET | Freq: Every day | ORAL | Status: DC
  Filled 2014-05-18 (×3): qty 1

## 2014-05-18 MED ORDER — GLYCOPYRROLATE 0.2 MG/ML IJ SOLN
INTRAMUSCULAR | Status: DC | PRN
  Administered 2014-05-18: 0.2 mg via INTRAVENOUS

## 2014-05-18 MED ORDER — OXYCODONE-ACETAMINOPHEN 5-325 MG PO TABS
1.0000 | ORAL_TABLET | ORAL | Status: DC | PRN
  Administered 2014-05-18: 2 via ORAL
  Administered 2014-05-19: 1 via ORAL
  Administered 2014-05-19: 2 via ORAL
  Filled 2014-05-18: qty 1
  Filled 2014-05-18 (×2): qty 2

## 2014-05-18 MED ORDER — KETOROLAC TROMETHAMINE 30 MG/ML IJ SOLN
30.0000 mg | Freq: Once | INTRAMUSCULAR | Status: AC
  Administered 2014-05-18: 30 mg via INTRAVENOUS

## 2014-05-18 MED ORDER — PHENYLEPHRINE HCL 10 MG/ML IJ SOLN
INTRAMUSCULAR | Status: DC | PRN
  Administered 2014-05-18 (×2): 80 ug via INTRAVENOUS

## 2014-05-18 MED ORDER — METHOCARBAMOL 500 MG PO TABS
ORAL_TABLET | ORAL | Status: AC
  Filled 2014-05-18: qty 1

## 2014-05-18 MED ORDER — DEXAMETHASONE SODIUM PHOSPHATE 4 MG/ML IJ SOLN
INTRAMUSCULAR | Status: DC | PRN
  Administered 2014-05-18: 8 mg via INTRAVENOUS

## 2014-05-18 MED ORDER — ONDANSETRON HCL 4 MG/2ML IJ SOLN
INTRAMUSCULAR | Status: DC | PRN
  Administered 2014-05-18: 4 mg via INTRAVENOUS

## 2014-05-18 MED ORDER — ZOLPIDEM TARTRATE 5 MG PO TABS: 5.0000 mg | ORAL_TABLET | Freq: Every evening | ORAL | Status: DC | PRN

## 2014-05-18 SURGICAL SUPPLY — 50 items
ADH SKN CLS APL DERMABOND .7 (GAUZE/BANDAGES/DRESSINGS) ×1
BUR ROUND FLUTED 4 SOFT TCH (BURR) IMPLANT
BUR ROUND FLUTED 4MM SOFT TCH (BURR)
CORDS BIPOLAR (ELECTRODE) ×3 IMPLANT
COVER SURGICAL LIGHT HANDLE (MISCELLANEOUS) ×3 IMPLANT
DERMABOND ADVANCED (GAUZE/BANDAGES/DRESSINGS) ×2
DERMABOND ADVANCED .7 DNX12 (GAUZE/BANDAGES/DRESSINGS) ×1 IMPLANT
DRAPE C-ARM 42X72 X-RAY (DRAPES) ×3 IMPLANT
DRAPE MICROSCOPE LEICA (MISCELLANEOUS) ×3 IMPLANT
DRAPE PROXIMA HALF (DRAPES) ×6 IMPLANT
DRSG EMULSION OIL 3X3 NADH (GAUZE/BANDAGES/DRESSINGS) ×3 IMPLANT
DRSG MEPILEX BORDER 4X4 (GAUZE/BANDAGES/DRESSINGS) ×3 IMPLANT
DRSG MEPILEX BORDER 4X8 (GAUZE/BANDAGES/DRESSINGS) ×3 IMPLANT
DURAPREP 26ML APPLICATOR (WOUND CARE) ×3 IMPLANT
ELECT BLADE 4.0 EZ CLEAN MEGAD (MISCELLANEOUS) ×3
ELECT REM PT RETURN 9FT ADLT (ELECTROSURGICAL) ×3
ELECTRODE BLDE 4.0 EZ CLN MEGD (MISCELLANEOUS) IMPLANT
ELECTRODE REM PT RTRN 9FT ADLT (ELECTROSURGICAL) ×1 IMPLANT
GLOVE BIOGEL PI IND STRL 7.5 (GLOVE) ×1 IMPLANT
GLOVE BIOGEL PI IND STRL 8 (GLOVE) ×1 IMPLANT
GLOVE BIOGEL PI INDICATOR 7.5 (GLOVE) ×2
GLOVE BIOGEL PI INDICATOR 8 (GLOVE) ×2
GLOVE ECLIPSE 7.0 STRL STRAW (GLOVE) ×3 IMPLANT
GLOVE ORTHO TXT STRL SZ7.5 (GLOVE) ×3 IMPLANT
GOWN STRL REUS W/ TWL LRG LVL3 (GOWN DISPOSABLE) ×3 IMPLANT
GOWN STRL REUS W/TWL LRG LVL3 (GOWN DISPOSABLE) ×9
KIT BASIN OR (CUSTOM PROCEDURE TRAY) ×3 IMPLANT
KIT ROOM TURNOVER OR (KITS) ×3 IMPLANT
MANIFOLD NEPTUNE II (INSTRUMENTS) ×3 IMPLANT
NDL SPNL 18GX3.5 QUINCKE PK (NEEDLE) ×1 IMPLANT
NDL SUT .5 MAYO 1.404X.05X (NEEDLE) ×1 IMPLANT
NEEDLE 22X1 1/2 (OR ONLY) (NEEDLE) ×3 IMPLANT
NEEDLE MAYO TAPER (NEEDLE) ×3
NEEDLE SPNL 18GX3.5 QUINCKE PK (NEEDLE) ×3 IMPLANT
NS IRRIG 1000ML POUR BTL (IV SOLUTION) ×3 IMPLANT
PACK LAMINECTOMY ORTHO (CUSTOM PROCEDURE TRAY) ×3 IMPLANT
PAD ARMBOARD 7.5X6 YLW CONV (MISCELLANEOUS) ×6 IMPLANT
PATTIES SURGICAL .5 X.5 (GAUZE/BANDAGES/DRESSINGS) ×3 IMPLANT
PATTIES SURGICAL .75X.75 (GAUZE/BANDAGES/DRESSINGS) IMPLANT
SPONGE GAUZE 4X4 12PLY (GAUZE/BANDAGES/DRESSINGS) ×3 IMPLANT
SUT VIC AB 2-0 CT1 27 (SUTURE) ×3
SUT VIC AB 2-0 CT1 TAPERPNT 27 (SUTURE) ×1 IMPLANT
SUT VICRYL 0 TIES 12 18 (SUTURE) ×3 IMPLANT
SUT VICRYL 4-0 PS2 18IN ABS (SUTURE) IMPLANT
SUT VICRYL AB 2 0 TIES (SUTURE) ×3 IMPLANT
SYR 20ML ECCENTRIC (SYRINGE) IMPLANT
SYR CONTROL 10ML LL (SYRINGE) ×3 IMPLANT
TOWEL OR 17X24 6PK STRL BLUE (TOWEL DISPOSABLE) ×3 IMPLANT
TOWEL OR 17X26 10 PK STRL BLUE (TOWEL DISPOSABLE) ×3 IMPLANT
WATER STERILE IRR 1000ML POUR (IV SOLUTION) ×3 IMPLANT

## 2014-05-18 NOTE — Anesthesia Postprocedure Evaluation (Signed)
Anesthesia Post Note  Patient: [name] [name]  Procedure(s) Performed: Procedure(s) (LRB): LUMBAR LAMINECTOMY Microdiscectomy Right Lumbar five - sacral one (Right)  Anesthesia type: general  Patient location: PACU  Post pain: Pain level controlled  Post assessment: Patient's Cardiovascular Status Stable  Last Vitals:  Filed Vitals:   05/18/14 1725  BP: 116/75  Pulse: 55  Temp: 37 C  Resp: 9    Post vital signs: Reviewed and stable  Level of consciousness: sedated  Complications: No apparent anesthesia complications

## 2014-05-18 NOTE — Discharge Instructions (Signed)
No lifting greater than 10 lbs. Avoid bending, stooping and twisting. Walk in house for first week them may start to get out slowly increasing distance up to one mile by 3 weeks post op. Keep incision dry for 3 days, may use tegaderm or similar water impervious dressing. Ice packs to back daily for pain and swelling

## 2014-05-18 NOTE — Plan of Care (Signed)
Problem: Consults Goal: Diagnosis - Spinal Surgery Microdiscectomy: Right L5-S1

## 2014-05-18 NOTE — H&P (Signed)
[name] [name] is an 63 y.o. male.   Chief Complaint: right leg pain and back pain HPI:  post MRI for persistent problems with right leg pain.  MRI 02/21/2014 demonstrates a far lateral disc protrusion which is consistent with his pain that shoots down his right leg to the lateral foot and plantar surface of his foot.  There was concern that it might represent a nerve sheath tumor.  He does not have any particularly night pain.  He had a MRI 08/02/2014 which did not show any far lateral disc at that point.  No evidence of enlargement that would suggest it was a nerve sheath tumor.  He states the last couple of days he has been feeling significantly better.  He previously had a paracentral disc herniation but this has resolved on the present scan.  Patient has never had any surgery.  Some of the present scan is degraded by motion.  Past Medical History  Diagnosis Date  . CORONARY ARTERY DISEASE   . HYPERLIPIDEMIA   . ANEMIA-IRON DEFICIENCY   . GERD   . DIVERTICULOSIS, COLON   . BACK PAIN, LUMBAR   . Unspecified disorder of urethra and urinary tract   . HYPERTENSION     dr tom wall  . DM     on no medication ?prediabetes  . Chronic kidney disease     kidney stone  . Arthritis   . PONV (postoperative nausea and vomiting)     little nausea after having his hip replacement  . Hepatitis     States he was tested for Hep B and he had antibodies    Past Surgical History  Procedure Laterality Date  . Esophagogastroduodenoscopy  11/07/2005  . Stress cardiolite  11/30/2004  . Electrocardiogram  03/30/2007  . Kidney stones    . Total hip arthroplasty  10/28/2012    Procedure: TOTAL HIP ARTHROPLASTY;  Surgeon: [name], MD;  Location: [facility];  Service: Orthopedics;  Laterality: Right;  . Coronary angioplasty with stent placement      06    dr t. wall    Family History  Problem Relation Age of Onset  . Cancer Mother     Colon Cancer-at advanced age  . Cancer Sister     Breast Cancer   . Heart disease Father    Social History:  reports that he has been smoking Cigarettes.  He has a 42 pack-year smoking history. He has never used smokeless tobacco. He reports that he drinks alcohol. He reports that he does not use illicit drugs.  Allergies:  Allergies  Allergen Reactions  . Actos [Pioglitazone Hydrochloride]     Weight gain    Medications Prior to Admission  Medication Sig Dispense Refill  . Ascorbic Acid (VITAMIN C) 1000 MG tablet Take 2,000 mg by mouth daily.      Marland Kitchen atenolol (TENORMIN) 25 MG tablet Take 1 tablet (25 mg total) by mouth daily.  90 tablet  3  . atorvastatin (LIPITOR) 40 MG tablet Take 40 mg by mouth at bedtime.      . Cholecalciferol (VITAMIN D) 2000 UNITS tablet Take 2,000 Units by mouth daily.      . clomiPHENE (CLOMID) 50 MG tablet Take 25 mg by mouth daily.      . Coenzyme Q10 (CO Q 10) 100 MG CAPS Take 100 mg by mouth daily.       . DHA-EPA-VIT B6-B12-FOLIC ACID PO Take 1 tablet by mouth daily.       Marland Kitchen  lisinopril-hydrochlorothiazide (PRINZIDE,ZESTORETIC) 20-25 MG per tablet Take 1 tablet by mouth daily.  30 tablet  11  . Magnesium 400 MG CAPS Take 400 mg by mouth daily.       . meloxicam (MOBIC) 15 MG tablet Take 15 mg by mouth daily as needed for pain.       Marland Kitchen thiamine (VITAMIN B-1) 100 MG tablet Take 100 mg by mouth daily.      . vitamin B-12 (CYANOCOBALAMIN) 250 MCG tablet Take 250 mcg by mouth daily.      . Zinc 50 MG TABS Take 50 mg by mouth daily.      . nitroGLYCERIN (NITROSTAT) 0.4 MG SL tablet Place 0.4 mg under the tongue every 5 (five) minutes as needed for chest pain.        Results for orders placed during the hospital encounter of 05/18/14 (from the past 48 hour(s))  GLUCOSE, CAPILLARY     Status: Abnormal   Collection Time    05/18/14 10:39 AM      Result Value Ref Range   Glucose-Capillary 135 (*) 70 - 99 mg/dL   No results found.  Review of Systems  Musculoskeletal: Positive for back pain.       Right leg pain  All  other systems reviewed and are negative.   Blood pressure 142/87, pulse 64, temperature 97.7 F (36.5 C), temperature source Oral, resp. rate 20, weight 103.42 kg (228 lb), SpO2 96.00%. Physical Exam  Constitutional: He is oriented to person, place, and time. He appears well-developed and well-nourished.  HENT:  Head: Normocephalic and atraumatic.  Eyes: EOM are normal. Pupils are equal, round, and reactive to light.  Neck: Normal range of motion.  Cardiovascular: Normal rate.   Respiratory: Effort normal.  GI: Soft.  Musculoskeletal:  + SLR right.  DTR equal and symmetric in LEs.  No focal weakness.LEs  Neurological: He is alert and oriented to person, place, and time.  Skin: Skin is warm and dry.     Assessment/Plan Right L5-S1 far lateral recess HNP   PLAN:  Microdiscectomy right L5-S1 with excision of HNP  [name] [name] M 05/18/2014, 11:31 AM

## 2014-05-18 NOTE — Brief Op Note (Signed)
05/18/2014  4:06 PM  PATIENT:  [name] [name]  63 y.o. male  PRE-OPERATIVE DIAGNOSIS:  Right L5-S1 far lateral HNP  POST-OPERATIVE DIAGNOSIS:  right Lumbar five-sacral one herniated nucleus pulposis  PROCEDURE:  Procedure(s): LUMBAR LAMINECTOMY Microdiscectomy Right Lumbar five - sacral one (Right)  SURGEON:  Surgeon(s) and Role:    * [name], MD - Primary  PHYSICIAN ASSISTANT: Phillips Hay PAC  ASSISTANTS: none   ANESTHESIA:   general  EBL:  Total I/O In: 1200 [I.V.:1200] Out: 100 [Blood:100]  BLOOD ADMINISTERED:none  DRAINS: none   LOCAL MEDICATIONS USED:  NONE  SPECIMEN:  No Specimen  DISPOSITION OF SPECIMEN:  N/A  COUNTS:  YES  TOURNIQUET:  * No tourniquets in log *  DICTATION: .Note written in EPIC  PLAN OF CARE: Admit for overnight observation  PATIENT DISPOSITION:  PACU - hemodynamically stable.   Delay start of Pharmacological VTE agent (>24hrs) due to surgical blood loss or risk of bleeding: yes

## 2014-05-18 NOTE — Progress Notes (Signed)
Patient ambulated around the unit again (around the circle x 2) this time independently.  Patient tolerated walk well.

## 2014-05-18 NOTE — Transfer of Care (Signed)
Immediate Anesthesia Transfer of Care Note  Patient: [name] [name]  Procedure(s) Performed: Procedure(s): LUMBAR LAMINECTOMY Microdiscectomy Right Lumbar five - sacral one (Right)  Patient Location: PACU  Anesthesia Type:General  Level of Consciousness: awake, alert , oriented and patient cooperative  Airway & Oxygen Therapy: Patient Spontanous Breathing  Post-op Assessment: Report given to PACU RN, Post -op Vital signs reviewed and stable and Patient moving all extremities  Post vital signs: Reviewed and stable  Complications: No apparent anesthesia complications

## 2014-05-18 NOTE — Anesthesia Preprocedure Evaluation (Signed)
Anesthesia Evaluation  Patient identified by MRN, date of birth, ID band Patient awake    Reviewed: Allergy & Precautions, H&P , NPO status , Patient's Chart, lab work & pertinent test results  History of Anesthesia Complications (+) PONV and history of anesthetic complications  Airway Mallampati: II TM Distance: >3 FB Neck ROM: Full    Dental  (+) Teeth Intact, Dental Advisory Given   Pulmonary Current Smoker,    Pulmonary exam normal       Cardiovascular hypertension, Pt. on medications + CAD and + Cardiac Stents + dysrhythmias     Neuro/Psych negative psych ROS   GI/Hepatic GERD-  Medicated,(+) Hepatitis -  Endo/Other  diabetes  Renal/GU negative Renal ROS     Musculoskeletal   Abdominal   Peds  Hematology   Anesthesia Other Findings   Reproductive/Obstetrics                           Anesthesia Physical Anesthesia Plan  ASA: III  Anesthesia Plan: General   Post-op Pain Management:    Induction: Intravenous  Airway Management Planned: Oral ETT  Additional Equipment:   Intra-op Plan:   Post-operative Plan: Extubation in OR  Informed Consent: I have reviewed the patients History and Physical, chart, labs and discussed the procedure including the risks, benefits and alternatives for the proposed anesthesia with the patient or authorized representative who has indicated his/her understanding and acceptance.   Dental advisory given  Plan Discussed with: CRNA, Anesthesiologist and Surgeon  Anesthesia Plan Comments:         Anesthesia Quick Evaluation

## 2014-05-18 NOTE — Interval H&P Note (Signed)
History and Physical Interval Note:  05/18/2014 12:33 PM  [name] [name]  has presented today for surgery, with the diagnosis of Right L5-S1 far lateral HNP  The various methods of treatment have been discussed with the patient and family. After consideration of risks, benefits and other options for treatment, the patient has consented to  Procedure(s) with comments: MICRODISCECTOMY LUMBAR LAMINECTOMY (Right) - Right L5-S1 Far Lateral Microdiscectomy as a surgical intervention .  The patient's history has been reviewed, patient examined, no change in status, stable for surgery.  I have reviewed the patient's chart and labs.  Questions were answered to the patient's satisfaction.     Robyn Nohr C

## 2014-05-18 NOTE — Progress Notes (Signed)
Patient ambulated around the unit circle twice with the nurse tech.  Patient tolerated walk very well.

## 2014-05-19 ENCOUNTER — Telehealth: Payer: Self-pay | Admitting: *Deleted

## 2014-05-19 DIAGNOSIS — I4510 Unspecified right bundle-branch block: Secondary | ICD-10-CM

## 2014-05-19 DIAGNOSIS — K7581 Nonalcoholic steatohepatitis (NASH): Secondary | ICD-10-CM

## 2014-05-19 DIAGNOSIS — I2510 Atherosclerotic heart disease of native coronary artery without angina pectoris: Secondary | ICD-10-CM

## 2014-05-19 DIAGNOSIS — D509 Iron deficiency anemia, unspecified: Secondary | ICD-10-CM

## 2014-05-19 DIAGNOSIS — E119 Type 2 diabetes mellitus without complications: Secondary | ICD-10-CM

## 2014-05-19 DIAGNOSIS — I10 Essential (primary) hypertension: Secondary | ICD-10-CM

## 2014-05-19 DIAGNOSIS — E785 Hyperlipidemia, unspecified: Secondary | ICD-10-CM

## 2014-05-19 LAB — GLUCOSE, CAPILLARY: Glucose-Capillary: 130 mg/dL — ABNORMAL HIGH (ref 70–99)

## 2014-05-19 MED ORDER — ATORVASTATIN CALCIUM 20 MG PO TABS: 20.0000 mg | ORAL_TABLET | Freq: Every day | ORAL | Status: DC

## 2014-05-19 NOTE — Telephone Encounter (Signed)
Contacted pt to let him know that per Dr [name] he has chronically mildly elevated liver enzymes.  Informed pt that per Dr [name], she recommends for him todecrease his atorvastatin to 20 mg po daily.  Also informed pt that per Dr [name], she noted that he has elevated blood glucose.  Informed pt that per Dr [name] he should  come in and have repeat CMP and HbA1c 1 month from now.  Informed pt that Per Dr [name] all the rest of the labs are normal.  Confirmed pharmacy of choice with pt and scheduled him to come in for lab work on 8/10.  Pt verbalized understanding and agrees with this plan.

## 2014-05-19 NOTE — Progress Notes (Signed)
Subjective: 1 Day Post-Op Procedure(s) (LRB): LUMBAR LAMINECTOMY Microdiscectomy Right Lumbar five - sacral one (Right) Patient reports pain as mild.    Objective: Vital signs in last 24 hours: Temp:  [97.6 F (36.4 C)-98.6 F (37 C)] 98.1 F (36.7 C) (07/09 0446) Pulse Rate:  [46-64] 54 (07/09 0446) Resp:  [9-20] 16 (07/09 0446) BP: (106-151)/(60-87) 121/70 mmHg (07/09 0446) SpO2:  [92 %-98 %] 98 % (07/09 0446) Weight:  [103.42 kg (228 lb)] 103.42 kg (228 lb) (07/08 1036)  Intake/Output from previous day: 07/08 0701 - 07/09 0700 In: 1550 [P.O.:250; I.V.:1300] Out: 100 [Blood:100] Intake/Output this shift:    No results found for this basename: HGB,  in the last 72 hours No results found for this basename: WBC, RBC, HCT, PLT,  in the last 72 hours No results found for this basename: NA, K, CL, CO2, BUN, CREATININE, GLUCOSE, CALCIUM,  in the last 72 hours No results found for this basename: LABPT, INR,  in the last 72 hours  Neurologically intact  Assessment/Plan: 1 Day Post-Op Procedure(s) (LRB): LUMBAR LAMINECTOMY Microdiscectomy Right Lumbar five - sacral one (Right) Plan: discharge Home. Office 1 to 2 wks. Has appt. rx on chart  Ethridge Sollenberger C 05/19/2014, 7:51 AM

## 2014-05-19 NOTE — Op Note (Signed)
NAM[place]AMILA, [place] NO.:  [id number]  MEDICAL RECORD NO.:  [id number]  LOCATION:  5N07C                        FACILITY:  [facility]  PHYSICIAN:  Chermaine Schnyder C. Lorin Mercy, M.D.    DATE OF BIRTH:  1951-05-25  DATE OF PROCEDURE: DATE OF DISCHARGE:                              OPERATIVE REPORT   PREOPERATIVE DIAGNOSIS:  Far lateral to L5-S1 herniated nucleus pulposus.  POSTOPERATIVE DIAGNOSIS:  Far lateral to L5-S1 herniated nucleus pulposus.  PROCEDURE:  Attempted far lateral,midline  exposure right L5-S1 microdiskectomy with foraminotomy and removal of HNP.  SURGEON:  Ilamae Geng C. Lorin Mercy, M.D.  ASSISTANT:  Epimenio Foot, PA-C.  ANESTHESIA:  General.  ESTIMATED BLOOD LOSS:  Minimal.  INDICATIONS:  This is a 63 year old male, who was obese has had persistent problems with back pain, right leg pain, and far lateral disk herniation, extraforaminal with some foraminal component.  Review of imaging studies suggested that it was tight, but there may be room for exposure of the L5-S1 extraforaminal exposure.  On the opposite side, he definitely had much more overhang of the iliac crest, and overhang of the facet, but on the involved side, it appeared that there was room and discussed with patient that if exposure was unable to be performed, a standard microdiskectomy and foraminotomy will be performed.  DESCRIPTION OF PROCEDURE:  The patient was intubated, preoperative antibiotics were given, standard prepping and draping with the patient placed on prone on chest rolls.  Boss McCulloch retractors with extra deep blades were available and used as expected.  Needle localization at L5-S1 level with the needle placed at the expected level of the disk fragment.  Needle confirmed it was at L5-S1 level and 3-4 cm off the midline.  A paramedian longitudinal incision was made from cephalad to caudad and extra deep cerebellar were used to reach down to the fascial layer.  Fascia was  split and retractors were placed.  Due to the overhanging of the iliac crest, the patient had muscle splitting and ended up coming down to the inner laminar space rather than the transverse processes between L5 and S1.  Ligament was removed.  Dura was exposed and lateral ligaments were removed.  The iliac crest was more prominent and axial cuts on the MRI suggested and a straight shot was not available even if starting more cephalad in order to get down to the L5-S1 level.  Some retractor was placed, but option was either due removal portion of the iliac crest or move the outer one-half of the facet.  With lateral gutter decompressed chunks of ligament were removed.  Dura was retracted and anulus was incised for standard microdiskectomy on the right using the extra long Epstein curettes.  A pieces of disks were pushed down from the right side and some pieces were teased that were in the foraminal component out from the area where it had extruded cephalad adjacent to the L5 endplate.  L5 nerve root was well visualized and then palpated proximally.  Continued passes were made until a hockey stick was able be pushed and compressing down pushing disk fragments down toward the midline.  From the opposite side, micropituitary and straight upbiting pituitaries  were used with care taken to make sure it was inside the disk.  Continued pushing down with the hockey stick, going underneath the nerve root, not lateral until micropituitary was used for grasping of piece and a large chunks of disk was removed.  That gave good relief of the L5 nerve root, and hockey stick could be passed out laterally.  Measuring the distance from the lateral aspect of the facet after partial facetectomy removing the medial one-third of the facet.  There was good decompression and hockey stick was passed far enough so that it was out 4 mm past the lateral aspect of the facet joint.  This area was free.  Continued passes  were made to the midline of the disk and removal remaining fragments. Copious irrigation and standard closure of fascia with 2-0 Vicryl, the subcutaneous tissue, subcuticular skin closure, postop dressing, and transferred to the recovery room.  The patient tolerated the procedure well.     Shaquayla Klimas C. Lorin Mercy, M.D.     MCY/MEDQ  D:  05/18/2014  T:  05/19/2014  Job:  [id number]

## 2014-05-19 NOTE — Telephone Encounter (Signed)
LVM for pt to contact the office in regards to recent labs and new changes in meds and new lab orders per Dr [name].  Pt was sent a my chart message to follow-up on 7/10

## 2014-05-20 ENCOUNTER — Encounter (HOSPITAL_COMMUNITY): Payer: Self-pay | Admitting: Orthopaedic Surgery

## 2014-05-20 ENCOUNTER — Encounter: Payer: Self-pay | Admitting: Cardiology

## 2014-06-02 ENCOUNTER — Telehealth: Payer: Self-pay

## 2014-06-02 NOTE — Telephone Encounter (Signed)
Called pt and gave a reminder that his CPE will be due in November.

## 2014-06-06 NOTE — Discharge Summary (Signed)
Physician Discharge Summary  Patient ID: [name] [name] MRN: [id number] DOB/AGE: 1951/05/06 63 y.o.  Admit date: 05/18/2014 Discharge date: 05/19/2014  Admission Diagnoses:  HNP (herniated nucleus pulposus), lumbar right L5-S1  Discharge Diagnoses:  Principal Problem:   HNP (herniated nucleus pulposus), lumbar right L5-S1  Past Medical History  Diagnosis Date  . CORONARY ARTERY DISEASE   . HYPERLIPIDEMIA   . ANEMIA-IRON DEFICIENCY   . GERD   . DIVERTICULOSIS, COLON   . BACK PAIN, LUMBAR   . Unspecified disorder of urethra and urinary tract   . HYPERTENSION     dr tom wall  . DM     on no medication ?prediabetes  . Chronic kidney disease     kidney stone  . Arthritis   . PONV (postoperative nausea and vomiting)     little nausea after having his hip replacement  . Hepatitis     States he was tested for Hep B and he had antibodies    Surgeries: Procedure(s): LUMBAR LAMINECTOMY Microdiscectomy Right Lumbar five - sacral one on 05/18/2014   Consultants (if any):  none  Discharged Condition: Improved  Hospital Course: [name] [name] is an 63 y.o. male who was admitted 05/18/2014 with a diagnosis of HNP (herniated nucleus pulposus), lumbar and went to the operating room on 05/18/2014 and underwent the above named procedures.    He was given perioperative antibiotics:      Anti-infectives   Start     Dose/Rate Route Frequency Ordered Stop   05/18/14 2100  ceFAZolin (ANCEF) IVPB 1 g/50 mL premix     1 g 100 mL/hr over 30 Minutes Intravenous Every 8 hours 05/18/14 1805 05/19/14 0516   05/18/14 0600  ceFAZolin (ANCEF) IVPB 2 g/50 mL premix     2 g 100 mL/hr over 30 Minutes Intravenous On call to O.R. 05/17/14 1407 05/18/14 1311    .  He was given sequential compression devices, early ambulation for DVT prophylaxis.  He benefited maximally from the hospital stay and there were no complications.    Recent vital signs:  Filed Vitals:   05/19/14 0446  BP: 121/70  Pulse:  54  Temp: 98.1 F (36.7 C)  Resp: 16    Recent laboratory studies:  Lab Results  Component Value Date   HGB 15.1 05/06/2014   HGB 14.6 09/27/2013   HGB 10.8* 10/30/2012   Lab Results  Component Value Date   WBC 7.8 05/06/2014   PLT 193 05/06/2014   Lab Results  Component Value Date   INR 0.97 05/06/2014   Lab Results  Component Value Date   NA 141 05/11/2014   K 4.4 05/11/2014   CL 107 05/11/2014   CO2 29 05/11/2014   BUN 17 05/11/2014   CREATININE 0.9 05/11/2014   GLUCOSE 114* 05/11/2014    Discharge Medications:     Medication List         atenolol 25 MG tablet  Commonly known as:  TENORMIN  Take 1 tablet (25 mg total) by mouth daily.     clomiPHENE 50 MG tablet  Commonly known as:  CLOMID  Take 25 mg by mouth daily.     Co Q 10 100 MG Caps  Take 100 mg by mouth daily.     DHA-EPA-VIT B6-B12-FOLIC ACID PO  Take 1 tablet by mouth daily.     lisinopril-hydrochlorothiazide 20-25 MG per tablet  Commonly known as:  PRINZIDE,ZESTORETIC  Take 1 tablet by mouth daily.     Magnesium  400 MG Caps  Take 400 mg by mouth daily.     meloxicam 15 MG tablet  Commonly known as:  MOBIC  Take 15 mg by mouth daily as needed for pain.     methocarbamol 500 MG tablet  Commonly known as:  ROBAXIN  Take 1 tablet (500 mg total) by mouth every 6 (six) hours as needed for muscle spasms (spasm).     nitroGLYCERIN 0.4 MG SL tablet  Commonly known as:  NITROSTAT  Place 0.4 mg under the tongue every 5 (five) minutes as needed for chest pain.     oxyCODONE-acetaminophen 5-325 MG per tablet  Commonly known as:  ROXICET  Take 1-2 tablets by mouth every 4 (four) hours as needed.     thiamine 100 MG tablet  Commonly known as:  VITAMIN B-1  Take 100 mg by mouth daily.     vitamin B-12 250 MCG tablet  Commonly known as:  CYANOCOBALAMIN  Take 250 mcg by mouth daily.     vitamin C 1000 MG tablet  Take 2,000 mg by mouth daily.     Vitamin D 2000 UNITS tablet  Take 2,000 Units by mouth  daily.     Zinc 50 MG Tabs  Take 50 mg by mouth daily.        Diagnostic Studies: Dg Lumbar Spine 2-3 Views  05/18/2014   CLINICAL DATA:  Back pain.  EXAM: LUMBAR SPINE - 2-3 VIEW  COMPARISON:  Lumbar MRI 05/06/2014.  FINDINGS: Intraoperative lateral film taken at 1345 hr demonstrates a needle directed most closely at the L5 vertebral body and L5 spinous process.  IMPRESSION: As above.   Electronically Signed   By: Rolla Flatten M.D.   On: 05/18/2014 16:41    Disposition: 01-Home or Self Care  DISCHARGE INSTRUCTIONS:  No lifting greater than 10 lbs. Avoid bending, stooping and twisting. Walk in house for first week them may start to get out slowly increasing distance up to one mile by 3 weeks post op. Keep incision dry for 3 days, may use tegaderm or similar water impervious dressing. Ice packs to back daily for pain and swelling  Follow-up Information   Follow up with YATES,MARK C, MD. Schedule an appointment as soon as possible for a visit in 2 weeks.   Specialty:  Orthopedic Surgery   Contact information:   [place] [address] [phone]        Signed: Epimenio Foot 06/06/2014, 11:13 AM

## 2014-06-20 ENCOUNTER — Other Ambulatory Visit (INDEPENDENT_AMBULATORY_CARE_PROVIDER_SITE_OTHER): Payer: BC Managed Care – PPO

## 2014-06-20 DIAGNOSIS — K7581 Nonalcoholic steatohepatitis (NASH): Secondary | ICD-10-CM

## 2014-06-20 DIAGNOSIS — E785 Hyperlipidemia, unspecified: Secondary | ICD-10-CM

## 2014-06-20 DIAGNOSIS — E119 Type 2 diabetes mellitus without complications: Secondary | ICD-10-CM

## 2014-06-20 DIAGNOSIS — I2510 Atherosclerotic heart disease of native coronary artery without angina pectoris: Secondary | ICD-10-CM

## 2014-06-20 DIAGNOSIS — I4510 Unspecified right bundle-branch block: Secondary | ICD-10-CM

## 2014-06-20 DIAGNOSIS — I10 Essential (primary) hypertension: Secondary | ICD-10-CM

## 2014-06-20 DIAGNOSIS — D509 Iron deficiency anemia, unspecified: Secondary | ICD-10-CM

## 2014-06-20 DIAGNOSIS — K7689 Other specified diseases of liver: Secondary | ICD-10-CM

## 2014-06-20 LAB — HEMOGLOBIN A1C: Hgb A1c MFr Bld: 6.6 % — ABNORMAL HIGH (ref 4.6–6.5)

## 2014-06-20 LAB — COMPREHENSIVE METABOLIC PANEL
ALT: 64 U/L — ABNORMAL HIGH (ref 0–53)
AST: 61 U/L — ABNORMAL HIGH (ref 0–37)
Albumin: 4.1 g/dL (ref 3.5–5.2)
Alkaline Phosphatase: 53 U/L (ref 39–117)
BUN: 15 mg/dL (ref 6–23)
CO2: 27 mEq/L (ref 19–32)
Calcium: 9.9 mg/dL (ref 8.4–10.5)
Chloride: 105 mEq/L (ref 96–112)
Creatinine, Ser: 0.9 mg/dL (ref 0.4–1.5)
GFR: 94.44 mL/min (ref >60.00)
Glucose, Bld: 160 mg/dL — ABNORMAL HIGH (ref 70–99)
Potassium: 4.2 mEq/L (ref 3.5–5.1)
Sodium: 139 mEq/L (ref 135–145)
Total Bilirubin: 0.5 mg/dL (ref 0.2–1.2)
Total Protein: 7.2 g/dL (ref 6.0–8.3)

## 2014-06-29 ENCOUNTER — Encounter: Payer: Self-pay | Admitting: Cardiology

## 2014-06-29 DIAGNOSIS — K760 Fatty (change of) liver, not elsewhere classified: Secondary | ICD-10-CM

## 2014-06-29 NOTE — Telephone Encounter (Signed)
Informed pt that per Dr [name] he has abnormal HbA1c consistent with uncontrolled diabetes, he should his PCP for that. Informed him that he has mildly elevated liver enzymes that are secondary to "fattly liver" and we will just follow for now,and repeat this lab in 6 months. Pt verbalized understanding and scheduled lab appt for 12/30/14.  Pt agrees with this plan

## 2014-07-01 ENCOUNTER — Encounter: Payer: Self-pay | Admitting: Endocrinology

## 2014-09-25 ENCOUNTER — Encounter: Payer: Self-pay | Admitting: Cardiology

## 2014-09-26 ENCOUNTER — Telehealth: Payer: Self-pay | Admitting: Cardiology

## 2014-09-26 NOTE — Telephone Encounter (Signed)
New Message:  [name] [name] is calling in about a refill request for Lisinopril HCTZ. Please call  Thanks

## 2014-09-27 ENCOUNTER — Other Ambulatory Visit: Payer: Self-pay

## 2014-09-27 MED ORDER — LISINOPRIL-HYDROCHLOROTHIAZIDE 20-25 MG PO TABS
1.0000 | ORAL_TABLET | Freq: Every day | ORAL | Status: DC
Start: 2014-09-27 — End: 2015-01-24

## 2014-09-30 ENCOUNTER — Encounter: Payer: BC Managed Care – PPO | Admitting: Endocrinology

## 2014-10-08 ENCOUNTER — Other Ambulatory Visit: Payer: Self-pay | Admitting: Endocrinology

## 2014-10-10 NOTE — Telephone Encounter (Signed)
Rx sent to pharamacy.

## 2014-10-10 NOTE — Telephone Encounter (Signed)
Please advise if ok to refill. Pt was last seen back in 2013. Thanks!

## 2014-10-10 NOTE — Telephone Encounter (Signed)
Please refill x 1 cpx is due

## 2014-10-14 ENCOUNTER — Ambulatory Visit (INDEPENDENT_AMBULATORY_CARE_PROVIDER_SITE_OTHER): Payer: BC Managed Care – PPO | Admitting: Endocrinology

## 2014-10-14 ENCOUNTER — Other Ambulatory Visit: Payer: Self-pay

## 2014-10-14 ENCOUNTER — Encounter: Payer: Self-pay | Admitting: Endocrinology

## 2014-10-14 VITALS — BP 132/80 | HR 67 | Temp 98.3°F | Ht 68.0 in | Wt 227.0 lb

## 2014-10-14 DIAGNOSIS — E119 Type 2 diabetes mellitus without complications: Secondary | ICD-10-CM

## 2014-10-14 DIAGNOSIS — I10 Essential (primary) hypertension: Secondary | ICD-10-CM

## 2014-10-14 DIAGNOSIS — Z0000 Encounter for general adult medical examination without abnormal findings: Secondary | ICD-10-CM

## 2014-10-14 DIAGNOSIS — E291 Testicular hypofunction: Secondary | ICD-10-CM

## 2014-10-14 DIAGNOSIS — E785 Hyperlipidemia, unspecified: Secondary | ICD-10-CM

## 2014-10-14 DIAGNOSIS — Z125 Encounter for screening for malignant neoplasm of prostate: Secondary | ICD-10-CM

## 2014-10-14 DIAGNOSIS — Z23 Encounter for immunization: Secondary | ICD-10-CM

## 2014-10-14 MED ORDER — ATORVASTATIN CALCIUM 20 MG PO TABS: 20.0000 mg | ORAL_TABLET | Freq: Every day | ORAL | Status: DC

## 2014-10-14 NOTE — Progress Notes (Signed)
Subjective:    Patient ID: [name] [name], male    DOB: 31-Mar-1951, 63 y.o.   MRN: [id number]  HPI Pt is here for regular wellness examination, and is feeling pretty well in general, and says chronic med probs are stable, except as noted below Past Medical History  Diagnosis Date  . CORONARY ARTERY DISEASE   . HYPERLIPIDEMIA   . ANEMIA-IRON DEFICIENCY   . GERD   . DIVERTICULOSIS, COLON   . BACK PAIN, LUMBAR   . Unspecified disorder of urethra and urinary tract   . HYPERTENSION     dr tom wall  . DM     on no medication ?prediabetes  . Chronic kidney disease     kidney stone  . Arthritis   . PONV (postoperative nausea and vomiting)     little nausea after having his hip replacement  . Hepatitis     States he was tested for Hep B and he had antibodies    Past Surgical History  Procedure Laterality Date  . Esophagogastroduodenoscopy  11/07/2005  . Stress cardiolite  11/30/2004  . Electrocardiogram  03/30/2007  . Kidney stones    . Total hip arthroplasty  10/28/2012    Procedure: TOTAL HIP ARTHROPLASTY;  Surgeon: [name], MD;  Location: [facility];  Service: Orthopedics;  Laterality: Right;  . Coronary angioplasty with stent placement      06    dr t. wall  . Lumbar laminectomy Right 05/18/2014    Procedure: LUMBAR LAMINECTOMY Microdiscectomy Right Lumbar five - sacral one;  Surgeon: [name], MD;  Location: [facility];  Service: Orthopedics;  Laterality: Right;    History   Social History  . Marital Status: Single    Spouse Name: N/A    Number of Children: 0  . 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 Smoking status: Current Every Day Smoker -- 1.00 packs/day for 42 years    Types: Cigarettes  . Smokeless tobacco: Never Used  . Alcohol Use: Yes     Comment: rare  . Drug Use: No  . Sexual Activity: Not on file   Other Topics Concern  . Not on file   Social History Narrative      Current Outpatient Prescriptions on File Prior to Visit  Medication Sig Dispense Refill  . Ascorbic Acid (VITAMIN C) 1000 MG tablet Take 2,000 mg by mouth daily.    Marland Kitchen atenolol (TENORMIN) 25 MG tablet Take 1 tablet (25 mg total) by mouth daily. 90 tablet 3  . Cholecalciferol (VITAMIN D) 2000 UNITS tablet Take 2,000 Units by mouth daily.    . clomiPHENE (CLOMID) 50 MG tablet Take 1/4 tablet by mouth once a day. APPOINTMENT NEEDED FOR FURTHER REFILLS. 30 tablet 5  . Coenzyme Q10 (CO Q 10) 100 MG CAPS Take 100 mg by mouth daily.     . DHA-EPA-VIT B6-B12-FOLIC ACID PO Take 1 tablet by mouth daily.     Marland Kitchen lisinopril-hydrochlorothiazide (PRINZIDE,ZESTORETIC) 20-25 MG per tablet Take 1 tablet by mouth daily. 30 tablet 3  . Magnesium 400 MG CAPS Take 400 mg by mouth daily.     . meloxicam (MOBIC) 15 MG tablet Take 15 mg by mouth daily as needed for pain.     . nitroGLYCERIN (NITROSTAT) 0.4 MG SL tablet Place 0.4 mg under the tongue every 5 (five) minutes as needed for chest  pain.    . thiamine (VITAMIN B-1) 100 MG tablet Take 100 mg by mouth daily.    . vitamin B-12 (CYANOCOBALAMIN) 250 MCG tablet Take 250 mcg by mouth daily.    . Zinc 50 MG TABS Take 50 mg by mouth daily.     No current facility-administered medications on file prior to visit.    Allergies  Allergen Reactions  . Actos [Pioglitazone Hydrochloride]     Weight gain    Family History  Problem Relation Age of Onset  . Cancer Mother     Colon Cancer-at advanced age  . Cancer Sister     Breast Cancer  . Heart disease Father     BP 132/80 mmHg  Pulse 67  Temp(Src) 98.3 F (36.8 C) (Oral)  Ht 5' 8"  (1.727 m)  Wt 227 lb (102.967 kg)  BMI 34.52 kg/m2  SpO2 94%     Review of Systems  Constitutional: Negative for fever.  HENT: Negative for hearing loss.   Eyes: Negative for visual disturbance.  Respiratory: Negative for shortness of breath.   Cardiovascular: Negative for chest pain.  Gastrointestinal: Negative for  anal bleeding.  Endocrine: Negative for cold intolerance.  Genitourinary: Negative for hematuria.  Musculoskeletal:       Low-back pain is improved  Skin: Negative for rash.  Allergic/Immunologic: Negative for environmental allergies.  Neurological: Negative for syncope.  Hematological: Does not bruise/bleed easily.  Psychiatric/Behavioral: Negative for dysphoric mood.       Objective:   Physical Exam VS: see vs page GEN: no distress HEAD: head: no deformity eyes: no periorbital swelling, no proptosis external nose and ears are normal mouth: no lesion seen NECK: supple, thyroid is not enlarged CHEST WALL: no deformity LUNGS: clear to auscultation BREASTS:  No gynecomastia CV: reg rate and rhythm, no murmur ABD: abdomen is soft, nontender.  no hepatosplenomegaly.  not distended.  no hernia GENITALIA:  Normal male.   RECTAL: normal external and internal exam.  heme neg. PROSTATE:  Normal size.  No nodule MUSCULOSKELETAL: muscle bulk and strength are grossly normal.  no obvious joint swelling.  gait is normal and steady. PULSES:  no carotid bruit NEURO:  cn 2-12 grossly intact.   readily moves all 4's.   SKIN:  Normal texture and temperature.  No rash or suspicious lesion is visible.   NODES:  None palpable at the neck.   PSYCH: alert, well-oriented.  Does not appear anxious nor depressed.    ecg machine is down today.      Assessment & Plan:  Wellness visit today, with problems stable, except as noted.   SEPARATE EVALUATION FOLLOWS--EACH PROBLEM HERE IS NEW, NOT RESPONDING TO TREATMENT, OR POSES SIGNIFICANT RISK TO THE PATIENT'S HEALTH: HISTORY OF THE PRESENT ILLNESS: Pt returns for f/u of diabetes mellitus: DM type: 2 Dx'ed: 9381 Complications: CAD Therapy: none DKA: never Severe hypoglycemia: never Pancreatitis: never Other: he has never been on insulin; he took metformin in the past Interval history: he brings a record of his cbg's which i have reviewed today.   It varies from 89-227.  There is no trend throughout the day.  He denies numbness. PAST MEDICAL HISTORY reviewed and up to date today REVIEW OF SYSTEMS: Denies weight change PHYSICAL EXAMINATION: VITAL SIGNS:  See vs page GENERAL: no distress Pulses: dorsalis pedis intact bilat.   Feet: no deformity.  no edema Skin:  no ulcer on the feet.  normal color and temp. Neuro: sensation is intact to touch on the  feet IMPRESSION: DM: he may need to resume metformin PLAN:  check a1c.  i advised diet.

## 2014-10-14 NOTE — Patient Instructions (Addendum)
please consider these measures for your health:  minimize alcohol.  do not use tobacco products.  have a colonoscopy at least every 10 years from age 63.   keep firearms safely stored.  always use seat belts.  have working smoke alarms in your home.  see an eye doctor and dentist regularly.  never drive under the influence of alcohol or drugs (including prescription drugs).  those with fair skin should take precautions against the sun. blood tests are being requested for you today.  We'll let you know about the results.   Based on the results, you might need to resume the metformin.

## 2014-10-16 LAB — CBC WITH DIFFERENTIAL/PLATELET
Basophils Absolute: 0 10*3/uL (ref 0.0–0.1)
Basophils Relative: 0.5 % (ref 0.0–3.0)
Eosinophils Absolute: 0.1 10*3/uL (ref 0.0–0.7)
Eosinophils Relative: 1.5 % (ref 0.0–5.0)
HCT: 48.6 % (ref 39.0–52.0)
Hemoglobin: 16 g/dL (ref 13.0–17.0)
Lymphocytes Relative: 13.8 % (ref 12.0–46.0)
Lymphs Abs: 1.1 10*3/uL (ref 0.7–4.0)
MCHC: 33.1 g/dL (ref 30.0–36.0)
MCV: 96.2 fl (ref 78.0–100.0)
Monocytes Absolute: 0.6 10*3/uL (ref 0.1–1.0)
Monocytes Relative: 7.8 % (ref 3.0–12.0)
Neutro Abs: 6 10*3/uL (ref 1.4–7.7)
Neutrophils Relative %: 76.4 % (ref 43.0–77.0)
Platelets: 183 10*3/uL (ref 150.0–400.0)
RBC: 5.05 Mil/uL (ref 4.22–5.81)
RDW: 14.7 % (ref 11.5–15.5)
WBC: 7.8 10*3/uL (ref 4.0–10.5)

## 2014-10-16 LAB — URINALYSIS, ROUTINE W REFLEX MICROSCOPIC
Bilirubin Urine: NEGATIVE
Hgb urine dipstick: NEGATIVE
Ketones, ur: NEGATIVE
Leukocytes, UA: NEGATIVE
Nitrite: NEGATIVE
Specific Gravity, Urine: 1.025 (ref 1.000–1.030)
Total Protein, Urine: NEGATIVE
Urine Glucose: 500 — AB
Urobilinogen, UA: 0.2 — AB (ref 0.0–1.0)
pH: 5.5 (ref 5.0–8.0)

## 2014-10-16 LAB — LIPID PANEL
Cholesterol: 150 mg/dL (ref 0–200)
HDL: 44.1 mg/dL (ref >39.00)
LDL Cholesterol: 84 mg/dL (ref 0–99)
NonHDL: 105.9
Total CHOL/HDL Ratio: 3
Triglycerides: 111 mg/dL (ref 0.0–149.0)
VLDL: 22.2 mg/dL (ref 0.0–40.0)

## 2014-10-16 LAB — PSA: PSA: 0.38 ng/mL (ref 0.10–4.00)

## 2014-10-16 LAB — HEMOGLOBIN A1C: Hgb A1c MFr Bld: 7.3 % — ABNORMAL HIGH (ref 4.6–6.5)

## 2014-10-16 LAB — MICROALBUMIN / CREATININE URINE RATIO
Creatinine,U: 105.7 mg/dL
Microalb Creat Ratio: 0.4 mg/g (ref 0.0–30.0)
Microalb, Ur: 0.4 mg/dL (ref 0.0–1.9)

## 2014-10-16 LAB — TSH: TSH: 1.98 u[IU]/mL (ref 0.35–4.50)

## 2014-10-17 ENCOUNTER — Other Ambulatory Visit: Payer: Self-pay | Admitting: Endocrinology

## 2014-10-17 MED ORDER — METFORMIN HCL ER 500 MG PO TB24: 500.0000 mg | ORAL_TABLET | Freq: Every day | ORAL | Status: DC

## 2014-10-28 ENCOUNTER — Encounter: Payer: Self-pay | Admitting: Cardiology

## 2014-10-28 ENCOUNTER — Ambulatory Visit (INDEPENDENT_AMBULATORY_CARE_PROVIDER_SITE_OTHER): Payer: BC Managed Care – PPO | Admitting: Cardiology

## 2014-10-28 VITALS — BP 128/86 | HR 58 | Ht 68.0 in | Wt 225.0 lb

## 2014-10-28 DIAGNOSIS — I2583 Coronary atherosclerosis due to lipid rich plaque: Secondary | ICD-10-CM

## 2014-10-28 DIAGNOSIS — I2510 Atherosclerotic heart disease of native coronary artery without angina pectoris: Secondary | ICD-10-CM

## 2014-10-28 DIAGNOSIS — R7989 Other specified abnormal findings of blood chemistry: Secondary | ICD-10-CM

## 2014-10-28 DIAGNOSIS — R945 Abnormal results of liver function studies: Secondary | ICD-10-CM

## 2014-10-28 DIAGNOSIS — E785 Hyperlipidemia, unspecified: Secondary | ICD-10-CM

## 2014-10-28 DIAGNOSIS — R079 Chest pain, unspecified: Secondary | ICD-10-CM

## 2014-10-28 DIAGNOSIS — I10 Essential (primary) hypertension: Secondary | ICD-10-CM

## 2014-10-28 NOTE — Patient Instructions (Signed)
Your physician wants you to follow-up in:   [place] will receive a reminder letter in the mail two months in advance. If you don't receive a letter, please call our office to schedule the follow-up appointment. Your physician recommends that you continue on your current medications as directed. Please refer to the Current Medication list given to you today. Your physician has requested that you have en exercise stress myoview. For further information please visit [email or web address]. Please follow instruction sheet, as given.

## 2014-10-28 NOTE — Progress Notes (Signed)
Patient ID: THEOPHILE HARVIE, male   DOB: Aug 07, 1951, 63 y.o.   MRN: [id number]     Patient Name: [name] [name] Date of Encounter: 10/28/2014  Primary Care Provider:  [name], MD Primary Cardiologist: Dorothy Spark   Problem List   Past Medical History  Diagnosis Date  . CORONARY ARTERY DISEASE   . HYPERLIPIDEMIA   . ANEMIA-IRON DEFICIENCY   . GERD   . DIVERTICULOSIS, COLON   . BACK PAIN, LUMBAR   . Unspecified disorder of urethra and urinary tract   . HYPERTENSION     dr tom wall  . DM     on no medication ?prediabetes  . Chronic kidney disease     kidney stone  . Arthritis   . PONV (postoperative nausea and vomiting)     little nausea after having his hip replacement  . Hepatitis     States he was tested for Hep B and he had antibodies   Past Surgical History  Procedure Laterality Date  . Esophagogastroduodenoscopy  11/07/2005  . Stress cardiolite  11/30/2004  . Electrocardiogram  03/30/2007  . Kidney stones    . Total hip arthroplasty  10/28/2012    Procedure: TOTAL HIP ARTHROPLASTY;  Surgeon: [name], MD;  Location: [facility];  Service: Orthopedics;  Laterality: Right;  . Coronary angioplasty with stent placement      06    dr t. wall  . Lumbar laminectomy Right 05/18/2014    Procedure: LUMBAR LAMINECTOMY Microdiscectomy Right Lumbar five - sacral one;  Surgeon: [name], MD;  Location: [facility];  Service: Orthopedics;  Laterality: Right;    Allergies  Allergies  Allergen Reactions  . Actos [Pioglitazone Hydrochloride]     Weight gain    HPI  [name] [name] comes in for evaluation and management his coronary artery disease.He is a former patient of Dr Verl Blalock. He received coronary stenting after a positive stress test in 2006 with improved fatigue and SOB. He is very active and asymptomatic. No complains.   He denies any angina or chest discomfort. He said orthopnea, PND or edema. He denies any symptoms of TIAs or mini strokes. He denies  claudication. He continues to smoke but has cut way back he says. He seems to be compliant with his medication. He denies any presyncope or palpitations.  10/28/2014 - the patient is coming after 1 year. He stays active, working for [employer] and denies DOE, but occasionally feels short episodes of chest pain. They are not associated with diaphoresis, palpitations or dizziness. No LE edema, orthopnea or PND. He is complaint with his meds, just had lipids checked and all are at goal. He underwent a hip replacement in March without any complications. We stopped Plavix at that time.  Home Medications  Prior to Admission medications   Medication Sig Start Date End Date Taking? Authorizing Provider  Ascorbic Acid (VITAMIN C) 1000 MG tablet Take 2,000 mg by mouth daily.   Yes Historical Provider, MD  atenolol (TENORMIN) 25 MG tablet Take 1 tablet (25 mg total) by mouth daily. 12/27/13  Yes [name], MD  atorvastatin (LIPITOR) 20 MG tablet Take 1 tablet (20 mg total) by mouth daily. 10/14/14  Yes [name], MD  Cholecalciferol (VITAMIN D) 2000 UNITS tablet Take 2,000 Units by mouth daily.   Yes Historical Provider, MD  clomiPHENE (CLOMID) 50 MG tablet Take 1/4 tablet by mouth once a day. APPOINTMENT NEEDED FOR FURTHER REFILLS. 10/10/14  Yes [name], MD  Coenzyme Q10 (CO Q 10) 100 MG CAPS Take 100 mg by mouth daily.    Yes Historical Provider, MD  DHA-EPA-VIT B6-B12-FOLIC ACID PO Take 1 tablet by mouth daily.    Yes Historical Provider, MD  lisinopril-hydrochlorothiazide (PRINZIDE,ZESTORETIC) 20-25 MG per tablet Take 1 tablet by mouth daily. 09/27/14  Yes Dorothy Spark, MD  Magnesium 400 MG CAPS Take 400 mg by mouth daily.    Yes Historical Provider, MD  meloxicam (MOBIC) 15 MG tablet Take 15 mg by mouth daily as needed for pain.  07/22/13  Yes Historical Provider, MD  metFORMIN (GLUCOPHAGE-XR) 500 MG 24 hr tablet Take 1 tablet (500 mg total) by mouth daily with breakfast. 10/17/14  Yes [name], MD  nitroGLYCERIN (NITROSTAT) 0.4 MG SL tablet Place 0.4 mg under the tongue every 5 (five) minutes as needed for chest pain.   Yes Historical Provider, MD  Zinc 50 MG TABS Take 50 mg by mouth daily.   Yes Historical Provider, MD    Family History  Family History  Problem Relation Age of Onset  . Cancer Mother     Colon Cancer-at advanced age  . Cancer Sister     Breast Cancer  . Heart disease Father     Social History  History   Social History  . Marital Status: Single    Spouse Name: N/A    Number of Children: 0  . 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 Smoking status: Current Every Day Smoker -- 1.00 packs/day for 42 years    Types: Cigarettes  . Smokeless tobacco: Never Used  . Alcohol Use: Yes     Comment: rare  . Drug Use: No  . Sexual Activity: Not on file   Other Topics Concern  . Not on file   Social History Narrative     Review of Systems, as per HPI, otherwise negative General:  No chills, fever, night sweats or weight changes.  Cardiovascular:  No chest pain, dyspnea on exertion, edema, orthopnea, palpitations, paroxysmal nocturnal dyspnea. Dermatological: No rash, lesions/masses Respiratory: No cough, dyspnea Urologic: No hematuria, dysuria Abdominal:   No nausea, vomiting, diarrhea, bright red blood per rectum, melena, or hematemesis Neurologic:  No visual changes, wkns, changes in mental status. All other systems reviewed and are otherwise negative except as noted above.  Physical Exam  Blood pressure 128/86, pulse 58, height 5' 8"  (1.727 m), weight 225 lb (102.059 kg).  General: Pleasant, NAD Psych: Normal affect. Neuro: Alert and oriented X 3. Moves all extremities spontaneously. HEENT: Normal  Neck: Supple without bruits or JVD. Lungs:  Resp regular and unlabored, CTA. Heart: RRR no s3, s4, or murmurs. Abdomen: Soft, non-tender, non-distended,  BS + x 4.  Extremities: No clubbing, cyanosis or edema. DP/PT/Radials 2+ and equal bilaterally.  Labs:  No results for input(s): CKTOTAL, CKMB, TROPONINI in the last 72 hours. Lab Results  Component Value Date   WBC 7.8 10/14/2014   HGB 16.0 10/14/2014   HCT 48.6 10/14/2014   MCV 96.2 10/14/2014   PLT 183.0 10/14/2014    No results found for: DDIMER Invalid input(s): POCBNP    Component Value Date/Time   NA 139 06/20/2014 1506   K 4.2 06/20/2014 1506   CL 105 06/20/2014 1506   CO2 27 06/20/2014 1506   GLUCOSE 160* 06/20/2014 1506   BUN 15 06/20/2014 1506   CREATININE  0.9 06/20/2014 1506   CREATININE 0.77 08/28/2012 0316   CALCIUM 9.9 06/20/2014 1506   PROT 7.2 06/20/2014 1506   ALBUMIN 4.1 06/20/2014 1506   AST 61* 06/20/2014 1506   ALT 64* 06/20/2014 1506   ALKPHOS 53 06/20/2014 1506   BILITOT 0.5 06/20/2014 1506   GFRNONAA >90 05/06/2014 1536   GFRAA >90 05/06/2014 1536   Lab Results  Component Value Date   CHOL 150 10/14/2014   HDL 44.10 10/14/2014   LDLCALC 84 10/14/2014   TRIG 111.0 10/14/2014    Accessory Clinical Findings  Echocardiogram - Stress echo: 2010 Stress results: Maximal heart rate during stress was 184bpm (112% of maximal predicted heart rate). The maximal predicted heart rate was 166bpm. The target heart rate was achieved. There was a normal resting blood pressure with a normal response to stress. The rate-pressure product for the peak heart rate and blood pressure was 26470m Hg/min. Functional capacity was normal. Stress ECG: No significant ST changes to suggest ischemia. Baseline: Peak stress: Stress echo results: No inducible wall motion abnormalities with exercise. Left ventricular ejection fraction was normal at rest and with stress. Normal echo stress   ECG - SR, RBBB, unchanged from 2014    Assessment & Plan  63year old male   1. H/o CAD, s/p PCI (? Artery) in 2006, stable asymptomatic , continue ASA, Plavix,  statin, BB. Now new atypical chest pain - since diabetic and > 8 years since the last stress test we will order an exercise nuclear stress test.   2. Hypertension - controlled -after we increased to 20-267mdaily the last year.  3. Hyperlipidemia - WNL at the last year appointment, mildly elevated LFTs, elevated in 2014, we will follow.  He is scheduled for lab work including BMP at his PCP office in February, we will follow.  4. Smoking cessation - counseled  5. DM - uncontrolled, HbA1c 7.2%, recently started on Metformin.  Follow up in 1 year unless abnormal stress test.    NEDorothy SparkMD, FACapitol Surgery Center LLC Dba [facility]2/18/2015, 2:56 PM

## 2014-11-07 ENCOUNTER — Encounter: Payer: Self-pay | Admitting: Cardiology

## 2014-11-08 ENCOUNTER — Ambulatory Visit (HOSPITAL_COMMUNITY): Payer: BC Managed Care – PPO | Attending: Cardiology | Admitting: Radiology

## 2014-11-08 DIAGNOSIS — R0602 Shortness of breath: Secondary | ICD-10-CM | POA: Insufficient documentation

## 2014-11-08 DIAGNOSIS — I10 Essential (primary) hypertension: Secondary | ICD-10-CM | POA: Insufficient documentation

## 2014-11-08 DIAGNOSIS — R079 Chest pain, unspecified: Secondary | ICD-10-CM

## 2014-11-08 DIAGNOSIS — I2510 Atherosclerotic heart disease of native coronary artery without angina pectoris: Secondary | ICD-10-CM | POA: Diagnosis not present

## 2014-11-08 DIAGNOSIS — E119 Type 2 diabetes mellitus without complications: Secondary | ICD-10-CM | POA: Diagnosis not present

## 2014-11-08 DIAGNOSIS — I4510 Unspecified right bundle-branch block: Secondary | ICD-10-CM | POA: Insufficient documentation

## 2014-11-08 MED ORDER — TECHNETIUM TC 99M SESTAMIBI GENERIC - CARDIOLITE
33.0000 | Freq: Once | INTRAVENOUS | Status: AC | PRN
  Administered 2014-11-08: 33 via INTRAVENOUS

## 2014-11-08 MED ORDER — TECHNETIUM TC 99M SESTAMIBI GENERIC - CARDIOLITE
11.0000 | Freq: Once | INTRAVENOUS | Status: AC | PRN
  Administered 2014-11-08: 11 via INTRAVENOUS

## 2014-11-08 NOTE — Progress Notes (Signed)
[name] [name] [address] [place], [place] [address] [phone]    Cardiology Nuclear Med Study  [name] [name] is a 63 y.o. male     MRN : [id number]     DOB: 08-31-1951  Procedure Date: 11/08/2014  Nuclear Med Background Indication for Stress Test:  Evaluation for Ischemia and Follow up CAD History:  CAD-Stent, '08 MPI: EF: 64% Ischemia Cardiac Risk Factors: Hypertension, NIDDM and RBBB  Symptoms:  DOE and SOB   Nuclear Pre-Procedure Caffeine/Decaff Intake:  12:00am NPO After: 12:00am   Lungs: clear O2 Sat: 95% on room air. IV 0.9% NS with Angio Cath:  22g  IV Site: R Hand  IV Started by:  Matilde Haymaker, RN  Chest Size (in):  46 Cup Size: n/a  Height: 5' 8"  (1.727 m)  Weight:  222 lb (100.699 kg)  BMI:  Body mass index is 33.76 kg/(m^2). Tech Comments:  No Atenolol x 36 hrs    Nuclear Med Study 1 or 2 day study: 1 day  Stress Test Type:  Stress  Reading MD: n/a  Order Authorizing Provider:  Filiberto Pinks  Resting Radionuclide: Technetium 60mSestamibi  Resting Radionuclide Dose: 11.0 mCi   Stress Radionuclide:  Technetium 938mestamibi  Stress Radionuclide Dose: 33.0 mCi           Stress Protocol Rest HR: 51 Stress HR: 139  Rest BP: 144/81 Stress BP: 199/92  Exercise Time (min): 6:45 METS: 8.10   Predicted Max HR: 158 bpm % Max HR: 87.97 bpm Rate Pressure Product: [phone] Dose of Adenosine (mg):  n/a Dose of Lexiscan: n/a mg  Dose of Atropine (mg): n/a Dose of Dobutamine: n/a mcg/kg/min (at max HR)  Stress Test Technologist: SaPerrin MalteseEMT-P  Nuclear Technologist:  ElEarl ManyCNMT     Rest Procedure:  Myocardial perfusion imaging was performed at rest 45 minutes following the intravenous administration of Technetium 9952mstamibi. Rest ECG: NSR-RBBB  Stress Procedure:  The patient exercised on the treadmill utilizing the Bruce Protocol for 6:45 minutes. The patient stopped due to sob, fatigue and denied  any chest pain.  Technetium 57m67mtamibi was injected at peak exercise and myocardial perfusion imaging was performed after a brief delay. Stress ECG: No significant ST segment change suggestive of ischemia.  QPS Raw Data Images:  Mild diaphragmatic attenuation.  Normal left ventricular size. Stress Images:  Normal homogeneous uptake in all areas of the myocardium. Rest Images:  Normal homogeneous uptake in all areas of the myocardium. Subtraction (SDS):  There is a fixed inferior defect that is most consistent with diaphragmatic attenuation. Transient Ischemic Dilatation (Normal <1.22):  0.92 Lung/Heart Ratio (Normal <0.45):  0.28  Quantitative Gated Spect Images QGS EDV:  105 ml QGS ESV:  37 ml  Impression Exercise Capacity:  Fair exercise capacity. BP Response:  Normal blood pressure response. Clinical Symptoms:  No chest pain. ECG Impression:  No significant ST segment change suggestive of ischemia. Comparison with Prior Nuclear Study: No significant change from previous study  Overall Impression:  Low risk stress nuclear study.  There is decreased inferior wall uptake on stress and rest images consistent with diaphragmatic attenuation.  No reversible ischemia. Wall motion of inferior wall is normal. Patient has RBBB. The secondary ST-T changes of RBBB are more prominent with exercise.  LV Ejection Fraction: 65%.  LV Wall Motion:  NL LV Function; NL Wall Motion  ThomDarlin Coco

## 2014-11-12 ENCOUNTER — Encounter: Payer: Self-pay | Admitting: Cardiology

## 2014-11-14 ENCOUNTER — Telehealth: Payer: Self-pay | Admitting: Cardiology

## 2014-11-14 NOTE — Telephone Encounter (Signed)
Contacted the pt to inform him that per Dr [name] his stress test was normal with normal LVEF.  Pt verbalized understanding.

## 2014-11-14 NOTE — Telephone Encounter (Signed)
New Message     Patient is returning nurses call please call patient. Thanks

## 2014-12-30 ENCOUNTER — Other Ambulatory Visit: Payer: BC Managed Care – PPO

## 2015-01-02 ENCOUNTER — Other Ambulatory Visit: Payer: Self-pay | Admitting: Endocrinology

## 2015-01-24 ENCOUNTER — Other Ambulatory Visit: Payer: Self-pay | Admitting: Cardiology

## 2015-01-27 ENCOUNTER — Other Ambulatory Visit (INDEPENDENT_AMBULATORY_CARE_PROVIDER_SITE_OTHER): Payer: BLUE CROSS/BLUE SHIELD | Admitting: *Deleted

## 2015-01-27 DIAGNOSIS — K760 Fatty (change of) liver, not elsewhere classified: Secondary | ICD-10-CM

## 2015-01-27 LAB — HEPATIC FUNCTION PANEL
ALT: 82 U/L — ABNORMAL HIGH (ref 0–53)
AST: 76 U/L — ABNORMAL HIGH (ref 0–37)
Albumin: 4.3 g/dL (ref 3.5–5.2)
Alkaline Phosphatase: 63 U/L (ref 39–117)
Bilirubin, Direct: 0.1 mg/dL (ref 0.0–0.3)
Total Bilirubin: 0.4 mg/dL (ref 0.2–1.2)
Total Protein: 6.9 g/dL (ref 6.0–8.3)

## 2015-01-30 ENCOUNTER — Other Ambulatory Visit: Payer: Self-pay | Admitting: *Deleted

## 2015-02-06 ENCOUNTER — Ambulatory Visit (INDEPENDENT_AMBULATORY_CARE_PROVIDER_SITE_OTHER): Payer: BLUE CROSS/BLUE SHIELD | Admitting: Family Medicine

## 2015-02-06 VITALS — BP 126/82 | HR 71 | Temp 98.0°F | Resp 17 | Ht 68.5 in | Wt 221.0 lb

## 2015-02-06 DIAGNOSIS — J209 Acute bronchitis, unspecified: Secondary | ICD-10-CM | POA: Diagnosis not present

## 2015-02-06 DIAGNOSIS — R059 Cough, unspecified: Secondary | ICD-10-CM

## 2015-02-06 DIAGNOSIS — R05 Cough: Secondary | ICD-10-CM

## 2015-02-06 MED ORDER — DOXYCYCLINE HYCLATE 100 MG PO CAPS: 100.0000 mg | ORAL_CAPSULE | Freq: Two times a day (BID) | ORAL | Status: DC

## 2015-02-06 MED ORDER — BENZONATATE 100 MG PO CAPS: 100.0000 mg | ORAL_CAPSULE | Freq: Three times a day (TID) | ORAL | Status: DC | PRN

## 2015-02-06 NOTE — Patient Instructions (Signed)
We are going to treat you for bronchitis with doxycycline (antibiotic)- take it twice a day with food and water Use the tessalon as needed for cough Let me know if you do not feel better soon!

## 2015-02-06 NOTE — Progress Notes (Signed)
[facility] [address], [place] [address] [phone]  Date:  02/06/2015   Name:  [name] [name]   DOB:  Feb 02, 1951   MRN:  [id number]  PCP:  [name], MD    Chief Complaint: Cough; Nasal Congestion; URI; and Sore Throat   History of Present Illness:  [name] [name] is a 64 y.o. very pleasant male patient who presents with the following:  Here today as a new pt.  2 weeks ago he first noted a dry cough, now getting worse and more productive.  He has noted fatigue, subjective fever.  He has not noted body aches.  He has noted some ST from cough.  No earache, no nasal sx. No GI symptoms  He has tried some OTC cough syrups.  No fever reducers today No sick contacts, he lives alone with his dog He is a smoker, suffers from hypogonadism   Patient Active Problem List   Diagnosis Date Noted  . HNP (herniated nucleus pulposus), lumbar 05/18/2014  . Hypercalcemia 09/29/2013  . Special screening for malignant neoplasms, colon 09/27/2013  . NASH (nonalcoholic steatohepatitis) 08/28/2012  . Screening for prostate cancer 08/28/2012  . Hypogonadism male 08/28/2012  . Other testicular hypofunction 07/22/2011  . Fatigue 07/05/2011  . RBBB 10/12/2010  . HEMORRHOIDS-INTERNAL 06/18/2010  . RECTAL BLEEDING 06/18/2010  . ARTHRALGIA 01/18/2010  . TOBACCO USER 07/11/2009  . POSTURAL LIGHTHEADEDNESS 07/11/2009  . FEVER UNSPECIFIED 06/30/2009  . ANEMIA-IRON DEFICIENCY 11/18/2008  . DIVERTICULOSIS, COLON 11/18/2008  . BACK PAIN, LUMBAR 09/01/2008  . SWEATING 07/26/2008  . Diabetes 01/20/2008  . COUGH 01/20/2008  . Dyslipidemia 06/02/2007  . Essential hypertension 06/02/2007  . CORONARY ARTERY DISEASE 06/02/2007  . GERD 06/02/2007    Past Medical History  Diagnosis Date  . CORONARY ARTERY DISEASE   . HYPERLIPIDEMIA   . ANEMIA-IRON DEFICIENCY   . GERD   . DIVERTICULOSIS, COLON   . BACK PAIN, LUMBAR   . Unspecified disorder of urethra and urinary  tract   . HYPERTENSION     dr tom wall  . DM     on no medication ?prediabetes  . Chronic kidney disease     kidney stone  . Arthritis   . PONV (postoperative nausea and vomiting)     little nausea after having his hip replacement  . Hepatitis     States he was tested for Hep B and he had antibodies    Past Surgical History  Procedure Laterality Date  . Esophagogastroduodenoscopy  11/07/2005  . Stress cardiolite  11/30/2004  . Electrocardiogram  03/30/2007  . Kidney stones    . Total hip arthroplasty  10/28/2012    Procedure: TOTAL HIP ARTHROPLASTY;  Surgeon: [name], MD;  Location: [facility];  Service: Orthopedics;  Laterality: Right;  . Coronary angioplasty with stent placement      06    dr t. wall  . Lumbar laminectomy Right 05/18/2014    Procedure: LUMBAR LAMINECTOMY Microdiscectomy Right Lumbar five - sacral one;  Surgeon: [name], MD;  Location: [facility];  Service: Orthopedics;  Laterality: Right;    History  Substance Use Topics  . Smoking status: Current Every Day Smoker -- 1.00 packs/day for 44 years    Types: Cigarettes  . Smokeless tobacco: Never Used  . Alcohol Use: 0.0 oz/week    0 Standard drinks or equivalent per week     Comment: rare    Family History  Problem Relation Age  of Onset  . Cancer Mother     Colon Cancer-at advanced age  . Cancer Sister     Breast Cancer  . Heart disease Father     Allergies  Allergen Reactions  . Actos [Pioglitazone Hydrochloride]     Weight gain    Medication list has been reviewed and updated.  Current Outpatient Prescriptions on File Prior to Visit  Medication Sig Dispense Refill  . Ascorbic Acid (VITAMIN C) 1000 MG tablet Take 2,000 mg by mouth daily.    Marland Kitchen atenolol (TENORMIN) 25 MG tablet TAKE 1 TABLET BY MOUTH EVERY DAY 90 tablet 3  . atorvastatin (LIPITOR) 20 MG tablet Take 0.5 tablets (10 mg total) by mouth daily. 90 tablet 3  . Cholecalciferol (VITAMIN D) 2000 UNITS tablet Take 2,000 Units by mouth  daily.    . clomiPHENE (CLOMID) 50 MG tablet Take 1/4 tablet by mouth once a day. APPOINTMENT NEEDED FOR FURTHER REFILLS. 30 tablet 5  . Coenzyme Q10 (CO Q 10) 100 MG CAPS Take 100 mg by mouth daily.     . DHA-EPA-VIT B6-B12-FOLIC ACID PO Take 1 tablet by mouth daily.     Marland Kitchen lisinopril-hydrochlorothiazide (PRINZIDE,ZESTORETIC) 20-25 MG per tablet TAKE 1 TABLET BY MOUTH EVERY DAY 30 tablet 6  . Magnesium 400 MG CAPS Take 400 mg by mouth daily.     . meloxicam (MOBIC) 15 MG tablet Take 15 mg by mouth daily as needed for pain.     . metFORMIN (GLUCOPHAGE-XR) 500 MG 24 hr tablet Take 1 tablet (500 mg total) by mouth daily with breakfast. 30 tablet 11  . nitroGLYCERIN (NITROSTAT) 0.4 MG SL tablet Place 0.4 mg under the tongue every 5 (five) minutes as needed for chest pain.    . Zinc 50 MG TABS Take 50 mg by mouth daily.     No current facility-administered medications on file prior to visit.    Review of Systems:  As per HPI- otherwise negative.   Physical Examination: Filed Vitals:   02/06/15 1511  BP: 126/82  Pulse: 71  Temp: 98 F (36.7 C)  Resp: 17   Filed Vitals:   02/06/15 1511  Height: 5' 8.5" (1.74 m)  Weight: 221 lb (100.245 kg)   Body mass index is 33.11 kg/(m^2). Ideal Body Weight: Weight in (lb) to have BMI = 25: 166.5  GEN: WDWN, NAD, Non-toxic, A & O x 3, overweight, looks well HEENT: Atraumatic, Normocephalic. Neck supple. No masses, No LAD.  Bilateral TM wnl, oropharynx normal.  PEERL,EOMI.   Ears and Nose: No external deformity. CV: RRR, No M/G/R. No JVD. No thrill. No extra heart sounds. PULM: CTA B, no wheezes, crackles, rhonchi. No retractions. No resp. distress. No accessory muscle use. EXTR: No c/c/e NEURO Normal gait.  PSYCH: Normally interactive. Conversant. Not depressed or anxious appearing.  Calm demeanor.    Assessment and Plan: Acute bronchitis, unspecified organism - Plan: doxycycline (VIBRAMYCIN) 100 MG capsule  Cough - Plan: benzonatate  (TESSALON) 100 MG capsule  Treat for persistent cough with doxycycline and tessalon perles.  He will let me know if not feeling better soon!   Signed Lamar Blinks, MD

## 2015-03-03 ENCOUNTER — Ambulatory Visit (INDEPENDENT_AMBULATORY_CARE_PROVIDER_SITE_OTHER): Payer: BLUE CROSS/BLUE SHIELD | Admitting: Endocrinology

## 2015-03-03 ENCOUNTER — Telehealth: Payer: Self-pay | Admitting: Endocrinology

## 2015-03-03 ENCOUNTER — Encounter: Payer: Self-pay | Admitting: Endocrinology

## 2015-03-03 ENCOUNTER — Ambulatory Visit
Admission: RE | Admit: 2015-03-03 | Discharge: 2015-03-03 | Disposition: A | Discharge status: Home / Self Care | Payer: BLUE CROSS/BLUE SHIELD | Source: Ambulatory Visit | Attending: Endocrinology | Admitting: Endocrinology

## 2015-03-03 VITALS — BP 138/94 | HR 71 | Temp 98.0°F | Ht 68.0 in | Wt 219.0 lb

## 2015-03-03 DIAGNOSIS — E119 Type 2 diabetes mellitus without complications: Secondary | ICD-10-CM

## 2015-03-03 DIAGNOSIS — R059 Cough, unspecified: Secondary | ICD-10-CM

## 2015-03-03 DIAGNOSIS — R05 Cough: Secondary | ICD-10-CM

## 2015-03-03 LAB — HEMOGLOBIN A1C: Hgb A1c MFr Bld: 6.8 % — ABNORMAL HIGH (ref 4.6–6.5)

## 2015-03-03 MED ORDER — FLUTICASONE PROPIONATE 50 MCG/ACT NA SUSP: 2.0000 | Freq: Every day | NASAL | Status: DC

## 2015-03-03 MED ORDER — ESOMEPRAZOLE MAGNESIUM 40 MG PO CPDR: 40.0000 mg | DELAYED_RELEASE_CAPSULE | Freq: Every day | ORAL | Status: DC

## 2015-03-03 NOTE — Telephone Encounter (Signed)
PA placed on your desk.

## 2015-03-03 NOTE — Progress Notes (Signed)
Subjective:    Patient ID: [name] [name], male    DOB: 06/23/1951, 64 y.o.   MRN: [id number]  HPI Pt returns for f/u of diabetes mellitus: DM type: 2 Dx'ed: 7096 Complications: CAD Therapy: metofrmin DKA: never Severe hypoglycemia: never Pancreatitis: never Other: he has never been on insulin; pioglitizone was favored due to NASH, but he did not tolerate (weight gain) Interval history: he says cbg's are well-controlled.   Pt reports 1 month of moderate prod-quality cough in the chest, and slight assoc wheezing.  Pt says his chgronic cough is different from his current sxs. Past Medical History  Diagnosis Date  . CORONARY ARTERY DISEASE   . HYPERLIPIDEMIA   . ANEMIA-IRON DEFICIENCY   . GERD   . DIVERTICULOSIS, COLON   . BACK PAIN, LUMBAR   . Unspecified disorder of urethra and urinary tract   . HYPERTENSION     dr tom wall  . DM     on no medication ?prediabetes  . Chronic kidney disease     kidney stone  . Arthritis   . PONV (postoperative nausea and vomiting)     little nausea after having his hip replacement  . Hepatitis     States he was tested for Hep B and he had antibodies    Past Surgical History  Procedure Laterality Date  . Esophagogastroduodenoscopy  11/07/2005  . Stress cardiolite  11/30/2004  . Electrocardiogram  03/30/2007  . Kidney stones    . Total hip arthroplasty  10/28/2012    Procedure: TOTAL HIP ARTHROPLASTY;  Surgeon: [name], MD;  Location: [facility];  Service: Orthopedics;  Laterality: Right;  . Coronary angioplasty with stent placement      06    dr t. wall  . Lumbar laminectomy Right 05/18/2014    Procedure: LUMBAR LAMINECTOMY Microdiscectomy Right Lumbar five - sacral one;  Surgeon: [name], MD;  Location: [facility];  Service: Orthopedics;  Laterality: Right;    History   Social History  . Marital Status: Single    Spouse Name: N/A  . Number of Children: 0  . 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 Smoking status: Current Every Day Smoker -- 1.00 packs/day for 44 years    Types: Cigarettes  . Smokeless tobacco: Never Used  . Alcohol Use: 0.0 oz/week    0 Standard drinks or equivalent per week     Comment: rare  . Drug Use: No  . Sexual Activity: Not on file   Other Topics Concern  . Not on file   Social History Narrative    Current Outpatient Prescriptions on File Prior to Visit  Medication Sig Dispense Refill  . Ascorbic Acid (VITAMIN C) 1000 MG tablet Take 2,000 mg by mouth daily.    Marland Kitchen atenolol (TENORMIN) 25 MG tablet TAKE 1 TABLET BY MOUTH EVERY DAY 90 tablet 3  . atorvastatin (LIPITOR) 20 MG tablet Take 0.5 tablets (10 mg total) by mouth daily. 90 tablet 3  . benzonatate (TESSALON) 100 MG capsule Take 1 capsule (100 mg total) by mouth 3 (three) times daily as needed for cough. 40 capsule 0  . Cholecalciferol (VITAMIN D) 2000 UNITS tablet Take 2,000 Units by mouth daily.    . clomiPHENE (CLOMID) 50 MG tablet Take 1/4 tablet by mouth once a day. APPOINTMENT NEEDED FOR FURTHER REFILLS. (Patient taking differently: Take 1/2 tablet by  mouth once a day. APPOINTMENT NEEDED FOR FURTHER REFILLS.) 30 tablet 5  . Coenzyme Q10 (CO Q 10) 100 MG CAPS Take 100 mg by mouth daily.     . DHA-EPA-VIT B6-B12-FOLIC ACID PO Take 1 tablet by mouth daily.     Marland Kitchen lisinopril-hydrochlorothiazide (PRINZIDE,ZESTORETIC) 20-25 MG per tablet TAKE 1 TABLET BY MOUTH EVERY DAY 30 tablet 6  . Magnesium 400 MG CAPS Take 400 mg by mouth daily.     . meloxicam (MOBIC) 15 MG tablet Take 15 mg by mouth daily as needed for pain.     . metFORMIN (GLUCOPHAGE-XR) 500 MG 24 hr tablet Take 1 tablet (500 mg total) by mouth daily with breakfast. 30 tablet 11  . nitroGLYCERIN (NITROSTAT) 0.4 MG SL tablet Place 0.4 mg under the tongue every 5 (five) minutes as needed for chest pain.    . Zinc 50 MG TABS Take 50 mg by mouth daily.     No current  facility-administered medications on file prior to visit.    Allergies  Allergen Reactions  . Actos [Pioglitazone Hydrochloride]     Weight gain    Family History  Problem Relation Age of Onset  . Cancer Mother     Colon Cancer-at advanced age  . Cancer Sister     Breast Cancer  . Heart disease Father     BP 138/94 mmHg  Pulse 71  Temp(Src) 98 F (36.7 C) (Oral)  Ht 5' 8"  (1.727 m)  Wt 219 lb (99.338 kg)  BMI 33.31 kg/m2  SpO2 97%  Review of Systems Denies fever and sob.    Objective:   Physical Exam VITAL SIGNS:  See vs page GENERAL: no distress LUNGS:  Clear to auscultation   Lab Results  Component Value Date   HGBA1C 6.8* 03/03/2015   CXR: NAD     Assessment & Plan:  Allergic bronchitis, new DM: well-controlled Chronic cough, recurrent HTN; poss exac by cough. We'll follow on same rx for now.   Patient is advised the following: Patient Instructions  blood tests and a chest-x-ray are requested for you today.  We'll let you know about the results. i have sent a prescription to your pharmacy, for an If the cough persists, we'll have to change the lisinopril to a different BP med.  Loratadine-d (non-prescription) may help your cough.   i have sent a prescription to your pharmacy, for nexium and flonase

## 2015-03-03 NOTE — Patient Instructions (Addendum)
blood tests and a chest-x-ray are requested for you today.  We'll let you know about the results. i have sent a prescription to your pharmacy, for an If the cough persists, we'll have to change the lisinopril to a different BP med.  Loratadine-d (non-prescription) may help your cough.   i have sent a prescription to your pharmacy, for nexium and flonase

## 2015-03-03 NOTE — Telephone Encounter (Signed)
i need pa for clomiphene please

## 2015-03-03 NOTE — Telephone Encounter (Signed)
done

## 2015-03-06 NOTE — Telephone Encounter (Signed)
Pa form faxed to insurance company.

## 2015-04-07 ENCOUNTER — Telehealth: Payer: Self-pay

## 2015-04-07 NOTE — Telephone Encounter (Signed)
Patient called and stated he would call our office next week to have his Blood Pressure Rechecked.

## 2015-05-24 ENCOUNTER — Other Ambulatory Visit: Payer: Self-pay | Admitting: Cardiology

## 2015-08-04 ENCOUNTER — Encounter: Payer: Self-pay | Admitting: Internal Medicine

## 2015-08-09 ENCOUNTER — Ambulatory Visit: Payer: BLUE CROSS/BLUE SHIELD | Admitting: *Deleted

## 2015-08-17 ENCOUNTER — Encounter: Payer: BLUE CROSS/BLUE SHIELD | Admitting: Internal Medicine

## 2015-08-20 ENCOUNTER — Other Ambulatory Visit: Payer: Self-pay | Admitting: Cardiology

## 2015-09-18 ENCOUNTER — Other Ambulatory Visit (INDEPENDENT_AMBULATORY_CARE_PROVIDER_SITE_OTHER): Payer: BLUE CROSS/BLUE SHIELD

## 2015-09-18 ENCOUNTER — Encounter: Payer: Self-pay | Admitting: Internal Medicine

## 2015-09-18 ENCOUNTER — Ambulatory Visit: Payer: BLUE CROSS/BLUE SHIELD | Admitting: Internal Medicine

## 2015-09-18 ENCOUNTER — Ambulatory Visit (INDEPENDENT_AMBULATORY_CARE_PROVIDER_SITE_OTHER): Payer: BLUE CROSS/BLUE SHIELD | Admitting: Internal Medicine

## 2015-09-18 VITALS — BP 134/84 | HR 64 | Ht 67.25 in | Wt 223.0 lb

## 2015-09-18 DIAGNOSIS — Z862 Personal history of diseases of the blood and blood-forming organs and certain disorders involving the immune mechanism: Secondary | ICD-10-CM | POA: Diagnosis not present

## 2015-09-18 DIAGNOSIS — K219 Gastro-esophageal reflux disease without esophagitis: Secondary | ICD-10-CM

## 2015-09-18 LAB — CBC WITH DIFFERENTIAL/PLATELET
Basophils Absolute: 0 10*3/uL (ref 0.0–0.1)
Basophils Relative: 0.4 % (ref 0.0–3.0)
Eosinophils Absolute: 0.1 10*3/uL (ref 0.0–0.7)
Eosinophils Relative: 1.6 % (ref 0.0–5.0)
HCT: 46.5 % (ref 39.0–52.0)
Hemoglobin: 15.8 g/dL (ref 13.0–17.0)
Lymphocytes Relative: 15.6 % (ref 12.0–46.0)
Lymphs Abs: 1.3 10*3/uL (ref 0.7–4.0)
MCHC: 33.9 g/dL (ref 30.0–36.0)
MCV: 93.6 fl (ref 78.0–100.0)
Monocytes Absolute: 0.6 10*3/uL (ref 0.1–1.0)
Monocytes Relative: 8 % (ref 3.0–12.0)
Neutro Abs: 6 10*3/uL (ref 1.4–7.7)
Neutrophils Relative %: 74.4 % (ref 43.0–77.0)
Platelets: 184 10*3/uL (ref 150.0–400.0)
RBC: 4.97 Mil/uL (ref 4.22–5.81)
RDW: 14.1 % (ref 11.5–15.5)
WBC: 8.1 10*3/uL (ref 4.0–10.5)

## 2015-09-18 NOTE — Progress Notes (Signed)
Quick Note:  This looks great, normal CBC notified through my chart ______

## 2015-09-18 NOTE — Patient Instructions (Addendum)
Your physician has requested that you go to the basement for the following lab work before leaving today: CBC/diff  Today you have been given a GERD handout to read and follow.   We will put you in our system for a colonoscopy recall for September 2021.  Please follow up with Dr [name] in Jan/Feb of 2017.  Take your omprazole daily.   I appreciate the opportunity to care for you. Silvano Rusk, MD, [facility]

## 2015-09-18 NOTE — Progress Notes (Signed)
Subjective:  Cc: heartburn  Patient ID: [name] [name], male    DOB: [birth year], 64 y.o.   MRN: [id number]  HPI Patient is here for follow-up. He was seen was going to be seen for a previsit for a routine colonoscopy was complaining about heartburn and indigestion at that time and was referred for a doctor visit. He had a colonoscopy in 2011 without any neoplasia, at that time a repeat colonoscopy was recommended for 5 years given his mother's history of colon cancer though now I understand that she was elderly well beyond her 52s when that occurred.  He is describing 2 or 3 days out of the week having heartburn and indigestion. He still smokes she realizes that that is unhealthy. He drinks 2-3 cups of coffee daily disease cutting back. He does not take her regular PPI though he is intermittently when he has upper respiratory infection he has a generic Nexium prescription. He does also have a history of AVM ablation at small bowel enteroscopy, he had iron deficiency anemia due to chronic blood loss he was on clopidogrel at the time as well. He has a history of some pharyngeal nodules and I sent him to ENT in 2011, I don't have the record of that at this point probably related to the computer changes. As best we can remember there was no major problem with that but the patient is wondering about that today. He saw Dr. Melissa Montane. Medications, allergies, past medical history, past surgical history, family history and social history are reviewed and updated in the EMR.  Review of Systems As above eyes negative    Objective:   Physical Exam @BP  134/84 mmHg  Pulse 64  Ht 5' 7.25" (1.708 m)  Wt 223 lb (101.152 kg)  BMI 34.67 kg/m2@  General:  Well-developed, well-nourished and in no acute distress Eyes:  anicteric. Lungs: Clear to auscultation bilaterally. Heart:  S1S2, no rubs, murmurs, gallops. Abdomen: Obese soft, non-tender, no hepatosplenomegaly, hernia, or mass and BS+.  Extremities:     no edema, cyanosis or clubbing Skin   no rash. Neuro:  A&O x 3.  Psych:  appropriate mood and  Affect.   Data Reviewed: Lab Results  Component Value Date   WBC 8.1 09/18/2015   HGB 15.8 09/18/2015   HCT 46.5 09/18/2015   MCV 93.6 09/18/2015   PLT 184.0 09/18/2015   Small bowel enteroscopy and colonoscopy reports 2011       Assessment & Plan:  Gastroesophageal reflux disease, esophagitis presence not specified  History of anemia  Review old ENT note re: pharyngeal nodules   I don't think he needs any endoscopic evaluation at this point. He has no danger or warning signs with the reflux symptoms. I have advised she use a GERD diet and try to continue to reduce caffeine, try to quit smoking and to lose weight. He will take his generic Nexium daily and see me in 2-3 months at which point we may consider weaning back on that.  His colonoscopy recall can be postponed for about 5 years because his mother was elderly so he didn't need one 5 years after the last originally thought cousin was unaware of the age at that time and because of updated guidelines. Repeat colonoscopy on a routine basis plan for September 2021, approximately.  Given his history of bleeding and AVMs, I will repeat a CBC he has not had one in 11 months. I appreciate the opportunity to care for this patient. CC:  [name], MD

## 2015-10-02 ENCOUNTER — Other Ambulatory Visit: Payer: Self-pay | Admitting: Endocrinology

## 2015-10-08 ENCOUNTER — Other Ambulatory Visit: Payer: Self-pay | Admitting: Endocrinology

## 2015-10-09 ENCOUNTER — Encounter: Payer: Self-pay | Admitting: Internal Medicine

## 2015-10-18 ENCOUNTER — Other Ambulatory Visit: Payer: Self-pay

## 2015-10-18 ENCOUNTER — Ambulatory Visit (INDEPENDENT_AMBULATORY_CARE_PROVIDER_SITE_OTHER): Payer: BLUE CROSS/BLUE SHIELD | Admitting: Endocrinology

## 2015-10-18 ENCOUNTER — Encounter: Payer: Self-pay | Admitting: Endocrinology

## 2015-10-18 VITALS — BP 144/90 | HR 72 | Temp 98.1°F | Ht 68.0 in | Wt 222.0 lb

## 2015-10-18 DIAGNOSIS — E119 Type 2 diabetes mellitus without complications: Secondary | ICD-10-CM

## 2015-10-18 DIAGNOSIS — Z125 Encounter for screening for malignant neoplasm of prostate: Secondary | ICD-10-CM | POA: Diagnosis not present

## 2015-10-18 DIAGNOSIS — Z0000 Encounter for general adult medical examination without abnormal findings: Secondary | ICD-10-CM

## 2015-10-18 DIAGNOSIS — K7581 Nonalcoholic steatohepatitis (NASH): Secondary | ICD-10-CM

## 2015-10-18 DIAGNOSIS — Z23 Encounter for immunization: Secondary | ICD-10-CM | POA: Diagnosis not present

## 2015-10-18 DIAGNOSIS — Z720 Tobacco use: Secondary | ICD-10-CM | POA: Diagnosis not present

## 2015-10-18 DIAGNOSIS — E291 Testicular hypofunction: Secondary | ICD-10-CM

## 2015-10-18 DIAGNOSIS — F17200 Nicotine dependence, unspecified, uncomplicated: Secondary | ICD-10-CM | POA: Insufficient documentation

## 2015-10-18 DIAGNOSIS — Z0189 Encounter for other specified special examinations: Secondary | ICD-10-CM

## 2015-10-18 DIAGNOSIS — R030 Elevated blood-pressure reading, without diagnosis of hypertension: Secondary | ICD-10-CM | POA: Diagnosis not present

## 2015-10-18 LAB — URINALYSIS, ROUTINE W REFLEX MICROSCOPIC
Bilirubin Urine: NEGATIVE
Hgb urine dipstick: NEGATIVE
Leukocytes, UA: NEGATIVE
Nitrite: NEGATIVE
Specific Gravity, Urine: 1.025 (ref 1.000–1.030)
Total Protein, Urine: NEGATIVE
Urine Glucose: NEGATIVE
Urobilinogen, UA: 0.2 (ref 0.0–1.0)
pH: 5.5 (ref 5.0–8.0)

## 2015-10-18 LAB — POCT GLYCOSYLATED HEMOGLOBIN (HGB A1C): Hemoglobin A1C: 7.2

## 2015-10-18 LAB — BASIC METABOLIC PANEL
BUN: 14 mg/dL (ref 6–23)
CO2: 28 mEq/L (ref 19–32)
Calcium: 9.8 mg/dL (ref 8.4–10.5)
Chloride: 102 mEq/L (ref 96–112)
Creatinine, Ser: 0.75 mg/dL (ref 0.40–1.50)
GFR: 111.61 mL/min (ref >60.00)
Glucose, Bld: 257 mg/dL — ABNORMAL HIGH (ref 70–99)
Potassium: 4.8 mEq/L (ref 3.5–5.1)
Sodium: 139 mEq/L (ref 135–145)

## 2015-10-18 LAB — HEPATIC FUNCTION PANEL
ALT: 105 U/L — ABNORMAL HIGH (ref 0–53)
AST: 124 U/L — ABNORMAL HIGH (ref 0–37)
Albumin: 4.3 g/dL (ref 3.5–5.2)
Alkaline Phosphatase: 85 U/L (ref 39–117)
Bilirubin, Direct: 0.1 mg/dL (ref 0.0–0.3)
Total Bilirubin: 0.4 mg/dL (ref 0.2–1.2)
Total Protein: 7 g/dL (ref 6.0–8.3)

## 2015-10-18 LAB — MICROALBUMIN / CREATININE URINE RATIO
Creatinine,U: 103 mg/dL
Microalb Creat Ratio: 2.3 mg/g (ref 0.0–30.0)
Microalb, Ur: 2.4 mg/dL — ABNORMAL HIGH (ref 0.0–1.9)

## 2015-10-18 LAB — LIPID PANEL
Cholesterol: 169 mg/dL (ref 0–200)
HDL: 37.9 mg/dL — ABNORMAL LOW (ref >39.00)
LDL Cholesterol: 101 mg/dL — ABNORMAL HIGH (ref 0–99)
NonHDL: 131.3
Total CHOL/HDL Ratio: 4
Triglycerides: 151 mg/dL — ABNORMAL HIGH (ref 0.0–149.0)
VLDL: 30.2 mg/dL (ref 0.0–40.0)

## 2015-10-18 LAB — PSA: PSA: 0.37 ng/mL (ref 0.10–4.00)

## 2015-10-18 LAB — TSH: TSH: 2.49 u[IU]/mL (ref 0.35–4.50)

## 2015-10-18 MED ORDER — CLOMIPHENE CITRATE 50 MG PO TABS: 25.0000 mg | ORAL_TABLET | Freq: Every day | ORAL | Status: DC

## 2015-10-18 MED ORDER — METFORMIN HCL ER 500 MG PO TB24: 1000.0000 mg | ORAL_TABLET | Freq: Every day | ORAL | Status: DC

## 2015-10-18 NOTE — Patient Instructions (Addendum)
i have sent a prescription to your pharmacy, to double the metformin.   please consider these measures for your health:  minimize alcohol.  do not use tobacco products.  have a colonoscopy at least every 10 years from age 63.  keep firearms safely stored.  always use seat belts.  have working smoke alarms in your home.  see an eye doctor and dentist regularly.  never drive under the influence of alcohol or drugs (including prescription drugs).  those with fair skin should take precautions against the sun.  it is critically important to prevent falling down (keep floor areas well-lit, dry, and free of loose objects.  If you have a cane, walker, or wheelchair, you should use it, even for short trips around the house.  Also, try not to rush).   Please come back for a follow-up appointment in 4-6 months.

## 2015-10-18 NOTE — Progress Notes (Signed)
Subjective:    Patient ID: [name] [name], male    DOB: 1951/01/01, 64 y.o.   MRN: [id number]  HPI Pt is here for regular wellness examination, and is feeling pretty well in general, and says chronic med probs are stable, except as noted below Past Medical History  Diagnosis Date  . CORONARY ARTERY DISEASE   . HYPERLIPIDEMIA   . ANEMIA-IRON DEFICIENCY   . GERD   . DIVERTICULOSIS, COLON   . BACK PAIN, LUMBAR   . Unspecified disorder of urethra and urinary tract   . HYPERTENSION     dr tom wall  . DM     on no medication ?prediabetes  . Chronic kidney disease     kidney stone  . Arthritis   . PONV (postoperative nausea and vomiting)     little nausea after having his hip replacement  . Hepatitis     States he was tested for Hep B and he had antibodies    Past Surgical History  Procedure Laterality Date  . Esophagogastroduodenoscopy  11/07/2005  . Stress cardiolite  11/30/2004  . Electrocardiogram  03/30/2007  . Kidney stones    . Total hip arthroplasty  10/28/2012    Procedure: TOTAL HIP ARTHROPLASTY;  Surgeon: [name], MD;  Location: [place];  Service: Orthopedics;  Laterality: Right;  . Coronary angioplasty with stent placement      06    dr t. wall  . Lumbar laminectomy Right 05/18/2014    Procedure: LUMBAR LAMINECTOMY Microdiscectomy Right Lumbar five - sacral one;  Surgeon: [name], MD;  Location: [place];  Service: Orthopedics;  Laterality: Right;  . Colonoscopy      Social History   Social History  . Marital Status: Single    Spouse Name: N/A  . Number of Children: 0  . 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 Smoking status: Current Every Day Smoker -- 1.00 packs/day for 44 years    Types: Cigarettes  . Smokeless tobacco: Never Used  . Alcohol Use: 0.0 oz/week    0 Standard drinks or equivalent per week     Comment: rare  . Drug Use: No  . Sexual Activity: Not  on file   Other Topics Concern  . Not on file   Social History Narrative    Current Outpatient Prescriptions on File Prior to Visit  Medication Sig Dispense Refill  . Ascorbic Acid (VITAMIN C) 1000 MG tablet Take 2,000 mg by mouth daily.    Marland Kitchen atenolol (TENORMIN) 25 MG tablet TAKE 1 TABLET BY MOUTH EVERY DAY 90 tablet 3  . atorvastatin (LIPITOR) 20 MG tablet TAKE 1 TABLET BY MOUTH EVERY DAY (Patient taking differently: TAKE 1/2 TABLET BY MOUTH EVERY DAY) 90 tablet 1  . b complex vitamins tablet Take 1 tablet by mouth daily.    . Cholecalciferol (VITAMIN D) 2000 UNITS tablet Take 2,000 Units by mouth daily.    . Coenzyme Q10 (CO Q 10) 100 MG CAPS Take 100 mg by mouth daily.     . Cyanocobalamin (VITAMIN B-12 PO) Take 1 tablet by mouth daily.    . DHA-EPA-VIT B6-B12-FOLIC ACID PO Take 1 tablet by mouth daily.     Marland Kitchen esomeprazole (NEXIUM) 40 MG capsule Take 1 capsule (40 mg total) by mouth daily at 12 noon. 30 capsule 5  . fluticasone (FLONASE) 50 MCG/ACT nasal  spray Place 2 sprays into both nostrils daily. 16 g 6  . FOLIC ACID PO Take 1 tablet by mouth daily.    . Ginkgo Biloba (GINKOBA PO) Take 1 tablet by mouth daily.    Marland Kitchen lisinopril-hydrochlorothiazide (PRINZIDE,ZESTORETIC) 20-25 MG tablet TAKE 1 TABLET BY MOUTH EVERY DAY 30 tablet 3  . Magnesium 400 MG CAPS Take 400 mg by mouth daily.     . meloxicam (MOBIC) 15 MG tablet Take 15 mg by mouth daily as needed for pain.     . Multiple Vitamin (MULTIVITAMIN) tablet Take 1 tablet by mouth daily.    . nitroGLYCERIN (NITROSTAT) 0.4 MG SL tablet Place 0.4 mg under the tongue every 5 (five) minutes as needed for chest pain.    Marland Kitchen VITAMIN A PO Take 1 tablet by mouth every other day.    . vitamin E 100 UNIT capsule Take 100 Units by mouth daily.    . Zinc 50 MG TABS Take 50 mg by mouth daily.     No current facility-administered medications on file prior to visit.    Allergies  Allergen Reactions  . Actos [Pioglitazone Hydrochloride]      Weight gain    Family History  Problem Relation Age of Onset  . Colon cancer Mother     Colon Cancer-at advanced age  . Breast cancer Sister   . Heart disease Father     BP 144/90 mmHg  Pulse 72  Temp(Src) 98.1 F (36.7 C) (Oral)  Ht 5' 8"  (1.727 m)  Wt 222 lb (100.699 kg)  BMI 33.76 kg/m2  SpO2 97%   Review of Systems  Constitutional: Negative for fever.  HENT: Negative for hearing loss.   Eyes: Negative for visual disturbance.  Respiratory: Negative for shortness of breath.   Cardiovascular: Negative for chest pain.  Gastrointestinal: Negative for anal bleeding.  Endocrine: Negative for cold intolerance.  Genitourinary: Negative for hematuria and difficulty urinating.  Musculoskeletal: Negative for back pain.  Skin: Negative for rash.  Allergic/Immunologic: Positive for environmental allergies.  Neurological: Negative for syncope and headaches.  Hematological: Does not bruise/bleed easily.  Psychiatric/Behavioral: Negative for dysphoric mood.       Objective:   Physical Exam VS: see vs page GEN: no distress HEAD: head: no deformity eyes: no periorbital swelling, no proptosis external nose and ears are normal mouth: no lesion seen NECK: supple, thyroid is not enlarged CHEST WALL: no deformity LUNGS: clear to auscultation BREASTS:  No gynecomastia CV: reg rate and rhythm, no murmur ABD: abdomen is soft, nontender.  no hepatosplenomegaly.  not distended.  no hernia RECTAL: normal external and internal exam.  heme neg. PROSTATE:  Normal size.  No nodule MUSCULOSKELETAL: muscle bulk and strength are grossly normal.  no obvious joint swelling.  gait is normal and steady PULSES: no carotid bruit NEURO:  cn 2-12 grossly intact.   readily moves all 4's.   SKIN:  Normal texture and temperature.  No rash or suspicious lesion is visible.   NODES:  None palpable at the neck PSYCH: alert, well-oriented.  Does not appear anxious nor depressed.    Assessment & Plan:    Wellness visit today, with problems stable, except as noted.   SEPARATE EVALUATION FOLLOWS--EACH PROBLEM HERE IS NEW, NOT RESPONDING TO TREATMENT, OR POSES SIGNIFICANT RISK TO THE PATIENT'S HEALTH: HISTORY OF THE PRESENT ILLNESS: Pt returns for f/u of DM. DM type: 2 Dx'ed: 1740 Complications: CAD Therapy: metofrmin DKA: never Severe hypoglycemia: never Pancreatitis: never Other: he has never  been on insulin; pioglitizone was favored due to NASH, but he did not tolerate (weight gain) Interval history: he says cbg's are well-controlled.    PAST MEDICAL HISTORY reviewed and up to date today REVIEW OF SYSTEMS: Denies weight change PHYSICAL EXAMINATION: VITAL SIGNS:  See vs page. GENERAL: no distress. Pulses: dorsalis pedis intact bilat.   MSK: no deformity of the feet CV: trace bilat leg edema Skin:  no ulcer on the feet.  normal color and temp on the feet. Neuro: sensation is intact to touch on the feet. LAB/XRAY RESULTS: Lab Results  Component Value Date   ALT 105* 10/18/2015   AST 124* 10/18/2015   ALKPHOS 85 10/18/2015   BILITOT 0.4 10/18/2015   Lab Results  Component Value Date   HGBA1C 7.2 10/18/2015  IMPRESSION: DM: Needs increased rx, if it can be done with a regimen that avoids or minimizes hypoglycemia. NASH: worse Edema: this precludes resumption of pioglitizone.   HTN: with ? of situational component.  We'll follow.  PLAN:  i have sent a prescription to your pharmacy, to double the metformin.   Weight loss is advised.

## 2015-10-18 NOTE — Progress Notes (Signed)
we discussed code status.  pt requests full code, but would not want to be started or maintained on artificial life-support measures if there was not a reasonable chance of recovery 

## 2015-10-21 LAB — TESTOSTERONE,FREE AND TOTAL
Testosterone, Free: 3.3 pg/mL — ABNORMAL LOW (ref 6.6–18.1)
Testosterone: 318 ng/dL — ABNORMAL LOW (ref 348–1197)

## 2015-10-27 ENCOUNTER — Telehealth: Payer: Self-pay | Admitting: Endocrinology

## 2015-10-27 ENCOUNTER — Inpatient Hospital Stay: Admission: RE | Admit: 2015-10-27 | Payer: BLUE CROSS/BLUE SHIELD | Source: Ambulatory Visit

## 2015-10-27 DIAGNOSIS — F17200 Nicotine dependence, unspecified, uncomplicated: Secondary | ICD-10-CM

## 2015-10-27 NOTE — Telephone Encounter (Signed)
[facility] needs to verify if the CT scheduled for today is a follow up from the Chest Xray or checking for Lung Cancer.  Please advise, Thanks!

## 2015-10-27 NOTE — Telephone Encounter (Signed)
[facility] notified new order has been placed and to initiate the PA.

## 2015-10-27 NOTE — Telephone Encounter (Signed)
checking for Lung Cancer.

## 2015-10-27 NOTE — Telephone Encounter (Signed)
Baker Janus with [facility] [phone] pt is scheduled for ct today

## 2015-10-27 NOTE — Telephone Encounter (Signed)
i ordered

## 2015-10-27 NOTE — Telephone Encounter (Signed)
I contacted the [facility] and advised of note below. Order will need to be changed to low dose Lung Cancer Screening for the pt to have this test completed.  Please advise, Thanks!

## 2015-11-03 ENCOUNTER — Telehealth: Payer: Self-pay | Admitting: Endocrinology

## 2015-11-03 NOTE — Telephone Encounter (Signed)
i think he should have it.  i did not know there was a PA needed.  How is the PA done?

## 2015-11-03 NOTE — Telephone Encounter (Signed)
Patient need a prior Auth of CT chest Lung Screening Code [id number] Appt Dec 30 16, please advise

## 2015-11-08 ENCOUNTER — Telehealth: Payer: Self-pay | Admitting: Endocrinology

## 2015-11-08 NOTE — Telephone Encounter (Signed)
Dorian Pod called from [facility] stated patient needed a new Authorization for the CT Low Dose Lung Cancer Screening.  Call  [place] Phone # [phone]

## 2015-11-08 NOTE — Telephone Encounter (Signed)
Please see below.

## 2015-11-09 ENCOUNTER — Telehealth: Payer: Self-pay | Admitting: Endocrinology

## 2015-11-09 NOTE — Telephone Encounter (Signed)
Dorian Pod from [facility] called stated patient is scheduled for a Cat Scan tomorrow and need a Prior Auth, phone # [phone]

## 2015-11-09 NOTE — Telephone Encounter (Signed)
Returned Ellen's call, had to leave a vm advising her that we do not have a [place] in office who handles PA's for procedures. Advised her to contact Ophelia Charter or Lelon Frohlich at the [place] office. Gave her the numbers to reach either one of them.

## 2015-11-10 ENCOUNTER — Encounter: Payer: Self-pay | Admitting: Endocrinology

## 2015-11-10 ENCOUNTER — Ambulatory Visit
Admission: RE | Admit: 2015-11-10 | Discharge: 2015-11-10 | Disposition: A | Discharge status: Home / Self Care | Payer: BLUE CROSS/BLUE SHIELD | Source: Ambulatory Visit | Attending: Endocrinology | Admitting: Endocrinology

## 2015-11-10 ENCOUNTER — Telehealth: Payer: Self-pay | Admitting: Acute Care

## 2015-11-10 DIAGNOSIS — F17200 Nicotine dependence, unspecified, uncomplicated: Secondary | ICD-10-CM

## 2015-11-10 NOTE — Telephone Encounter (Signed)
i called today: It is approved (must be done by 12/09/15) auth # is [id number]

## 2015-11-10 NOTE — Telephone Encounter (Signed)
Spoke with [name] [name]. She reports [facility] is calling her asking to change a code on the CT scan. Pt is scheduled for CT this afternoon Please advise Sarah. Thanks.

## 2015-11-14 NOTE — Telephone Encounter (Signed)
I contacted [facility] about this information and the pt had the test done on 11/10/2015. Results are in epic.

## 2015-11-14 NOTE — Telephone Encounter (Signed)
[name] [name], has this been taken care of? Please advise.

## 2015-11-14 NOTE — Telephone Encounter (Signed)
I contacted [facility] about this information and was advise the CT scan was preformed on 11/10/2015 and the results are in epic.

## 2015-11-16 NOTE — Telephone Encounter (Signed)
Sarah please advise. Thanks.

## 2015-11-17 ENCOUNTER — Encounter: Payer: Self-pay | Admitting: Cardiology

## 2015-11-17 ENCOUNTER — Ambulatory Visit (INDEPENDENT_AMBULATORY_CARE_PROVIDER_SITE_OTHER): Payer: BLUE CROSS/BLUE SHIELD | Admitting: Cardiology

## 2015-11-17 VITALS — BP 158/80 | HR 84 | Ht 68.0 in | Wt 217.0 lb

## 2015-11-17 DIAGNOSIS — Z9861 Coronary angioplasty status: Secondary | ICD-10-CM

## 2015-11-17 DIAGNOSIS — I10 Essential (primary) hypertension: Secondary | ICD-10-CM | POA: Diagnosis not present

## 2015-11-17 DIAGNOSIS — R945 Abnormal results of liver function studies: Secondary | ICD-10-CM

## 2015-11-17 DIAGNOSIS — E785 Hyperlipidemia, unspecified: Secondary | ICD-10-CM | POA: Diagnosis not present

## 2015-11-17 DIAGNOSIS — I2583 Coronary atherosclerosis due to lipid rich plaque: Secondary | ICD-10-CM

## 2015-11-17 DIAGNOSIS — I2510 Atherosclerotic heart disease of native coronary artery without angina pectoris: Secondary | ICD-10-CM

## 2015-11-17 DIAGNOSIS — R7989 Other specified abnormal findings of blood chemistry: Secondary | ICD-10-CM

## 2015-11-17 MED ORDER — ATORVASTATIN CALCIUM 20 MG PO TABS: 10.0000 mg | ORAL_TABLET | Freq: Every day | ORAL | Status: DC

## 2015-11-17 MED ORDER — NITROGLYCERIN 0.4 MG SL SUBL: 0.4000 mg | SUBLINGUAL_TABLET | SUBLINGUAL | Status: DC | PRN

## 2015-11-17 MED ORDER — HYDROCHLOROTHIAZIDE 25 MG PO TABS: 25.0000 mg | ORAL_TABLET | Freq: Every day | ORAL | Status: DC

## 2015-11-17 MED ORDER — LISINOPRIL 40 MG PO TABS: 40.0000 mg | ORAL_TABLET | Freq: Every day | ORAL | Status: DC

## 2015-11-17 NOTE — Patient Instructions (Signed)
Medication Instructions:   STOP ZESTORETIC NOW  START LISINOPRIL 40 MG ONCE DAILY  START HYDROCHLOROTHIAZIDE 25 MG ONCE DAILY     Follow-Up:  Your physician wants you to follow-up in: [place] will receive a reminder letter in the mail two months in advance. If you don't receive a letter, please call our office to schedule the follow-up appointment.     If you need a refill on your cardiac medications before your next appointment, please call your pharmacy.

## 2015-11-17 NOTE — Progress Notes (Signed)
Patient ID: [name] [name], male   DOB: May 22, 1951, 65 y.o.   MRN: [id number]     Patient Name: [name] [name] Date of Encounter: 11/17/2015  Primary Care Provider:  [name], MD Primary Cardiologist: [name] [name]   Problem List   Past Medical History  Diagnosis Date  . CORONARY ARTERY DISEASE   . HYPERLIPIDEMIA   . ANEMIA-IRON DEFICIENCY   . GERD   . DIVERTICULOSIS, COLON   . BACK PAIN, LUMBAR   . Unspecified disorder of urethra and urinary tract   . HYPERTENSION     dr [name] [name]  . DM     on no medication ?prediabetes  . Chronic kidney disease     kidney stone  . Arthritis   . PONV (postoperative nausea and vomiting)     little nausea after having his hip replacement  . Hepatitis     States he was tested for Hep B and he had antibodies   Past Surgical History  Procedure Laterality Date  . Esophagogastroduodenoscopy  11/07/2005  . Stress cardiolite  11/30/2004  . Electrocardiogram  03/30/2007  . Kidney stones    . Total hip arthroplasty  10/28/2012    Procedure: TOTAL HIP ARTHROPLASTY;  Surgeon: [name] Lights, MD;  Location: [place];  Service: Orthopedics;  Laterality: Right;  . Coronary angioplasty with stent placement      06    dr t. [name]  . Lumbar laminectomy Right 05/18/2014    Procedure: LUMBAR LAMINECTOMY Microdiscectomy Right Lumbar five - sacral one;  Surgeon: [name] Killings, MD;  Location: [place];  Service: Orthopedics;  Laterality: Right;  . Colonoscopy      Allergies  Allergies  Allergen Reactions  . Actos [Pioglitazone Hydrochloride]     Weight gain    HPI  [name] comes in for evaluation and management his coronary artery disease.He is a former patient of Dr [name] [name]. He received coronary stenting after a positive stress test in 2006 with improved fatigue and SOB. He is very active and asymptomatic. No complains.   He denies any angina or chest discomfort. He said orthopnea, PND or edema. He denies any symptoms of TIAs or mini strokes. He  denies claudication. He continues to smoke but has cut way back he says. He seems to be compliant with his medication. He denies any presyncope or palpitations.  11/17/2015 - the patient is coming after 1 year. He stays active, working for [employer] and denies DOE or chest pain. They are not associated with diaphoresis, palpitations or dizziness. No LE edema, orthopnea or PND. He is complaint with his meds, just had lipids checked and all are at goal. Off Plavix since March 2015. A nuclear stress test in Dec 2015 was normal, normal LVEF as well.  Home Medications  Prior to Admission medications   Medication Sig Start Date End Date Taking? Authorizing Provider  Ascorbic Acid (VITAMIN C) 1000 MG tablet Take 2,000 mg by mouth daily.   Yes Historical Provider, MD  atenolol (TENORMIN) 25 MG tablet Take 1 tablet (25 mg total) by mouth daily. 12/27/13  Yes [name] Shin, MD  atorvastatin (LIPITOR) 20 MG tablet Take 1 tablet (20 mg total) by mouth daily. 10/14/14  Yes [name] Shin, MD  Cholecalciferol (VITAMIN D) 2000 UNITS tablet Take 2,000 Units by mouth daily.   Yes Historical Provider, MD  clomiPHENE (CLOMID) 50 MG tablet Take 1/4 tablet by mouth once a day. APPOINTMENT NEEDED FOR FURTHER REFILLS. 10/10/14  Yes [name] Clark  Loanne Drilling, MD  Coenzyme Q10 (CO Q 10) 100 MG CAPS Take 100 mg by mouth daily.    Yes Historical Provider, MD  DHA-EPA-VIT B6-B12-FOLIC ACID PO Take 1 tablet by mouth daily.    Yes Historical Provider, MD  lisinopril-hydrochlorothiazide (PRINZIDE,ZESTORETIC) 20-25 MG per tablet Take 1 tablet by mouth daily. 09/27/14  Yes [name] Spark, MD  Magnesium 400 MG CAPS Take 400 mg by mouth daily.    Yes Historical Provider, MD  meloxicam (MOBIC) 15 MG tablet Take 15 mg by mouth daily as needed for pain.  07/22/13  Yes Historical Provider, MD  metFORMIN (GLUCOPHAGE-XR) 500 MG 24 hr tablet Take 1 tablet (500 mg total) by mouth daily with breakfast. 10/17/14  Yes [name] Shin, MD  nitroGLYCERIN (NITROSTAT) 0.4  MG SL tablet Place 0.4 mg under the tongue every 5 (five) minutes as needed for chest pain.   Yes Historical Provider, MD  Zinc 50 MG TABS Take 50 mg by mouth daily.   Yes Historical Provider, MD    Family History  Family History  Problem Relation Age of Onset  . Colon cancer Mother     Colon Cancer-at advanced age  . Breast cancer Sister   . Heart disease Father     Social History  Social History   Social History  . Marital Status: Single    Spouse Name: N/A  . Number of Children: 0  . 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 Smoking status: Current Every Day Smoker -- 1.00 packs/day for 44 years    Types: Cigarettes  . Smokeless tobacco: Never Used  . Alcohol Use: 0.0 oz/week    0 Standard drinks or equivalent per week     Comment: rare  . Drug Use: No  . Sexual Activity: Not on file   Other Topics Concern  . Not on file   Social History Narrative     Review of Systems, as per HPI, otherwise negative General:  No chills, fever, night sweats or weight changes.  Cardiovascular:  No chest pain, dyspnea on exertion, edema, orthopnea, palpitations, paroxysmal nocturnal dyspnea. Dermatological: No rash, lesions/masses Respiratory: No cough, dyspnea Urologic: No hematuria, dysuria Abdominal:   No nausea, vomiting, diarrhea, bright red blood per rectum, melena, or hematemesis Neurologic:  No visual changes, wkns, changes in mental status. All other systems reviewed and are otherwise negative except as noted above.  Physical Exam  Blood pressure 158/80, pulse 84, height 5' 8"  (1.727 m), weight 217 lb (98.431 kg), SpO2 95 %.  General: Pleasant, NAD Psych: Normal affect. Neuro: Alert and oriented X 3. Moves all extremities spontaneously. HEENT: Normal  Neck: Supple without bruits or JVD. Lungs:  Resp regular and unlabored, CTA. Heart: RRR no s3, s4, or murmurs. Abdomen: Soft,  non-tender, non-distended, BS + x 4.  Extremities: No clubbing, cyanosis or edema. DP/PT/Radials 2+ and equal bilaterally.  Labs:  No results for input(s): CKTOTAL, CKMB, TROPONINI in the last 72 hours. Lab Results  Component Value Date   WBC 8.1 09/18/2015   HGB 15.8 09/18/2015   HCT 46.5 09/18/2015   MCV 93.6 09/18/2015   PLT 184.0 09/18/2015    No results found for: DDIMER Invalid input(s): POCBNP    Component Value Date/Time   NA 139 10/18/2015 1419   K 4.8 10/18/2015 1419   CL 102 10/18/2015 1419   CO2 28 10/18/2015 1419  GLUCOSE 257* 10/18/2015 1419   BUN 14 10/18/2015 1419   CREATININE 0.75 10/18/2015 1419   CREATININE 0.77 08/28/2012 0316   CALCIUM 9.8 10/18/2015 1419   PROT 7.0 10/18/2015 1419   ALBUMIN 4.3 10/18/2015 1419   AST 124* 10/18/2015 1419   ALT 105* 10/18/2015 1419   ALKPHOS 85 10/18/2015 1419   BILITOT 0.4 10/18/2015 1419   GFRNONAA >90 05/06/2014 1536   GFRAA >90 05/06/2014 1536   Lab Results  Component Value Date   CHOL 169 10/18/2015   HDL 37.90* 10/18/2015   LDLCALC 101* 10/18/2015   TRIG 151.0* 10/18/2015    Accessory Clinical Findings  Echocardiogram - Stress echo: 2010 Stress results: Maximal heart rate during stress was 184bpm (112% of maximal predicted heart rate). The maximal predicted heart rate was 166bpm. The target heart rate was achieved. There was a normal resting blood pressure with a normal response to stress. The rate-pressure product for the peak heart rate and blood pressure was 26479m Hg/min. Functional capacity was normal. Stress ECG: No significant ST changes to suggest ischemia. Baseline: Peak stress: Stress echo results: No inducible [name] motion abnormalities with exercise. Left ventricular ejection fraction was normal at rest and with stress. Normal echo stress   ECG - SR, RBBB, unchanged from 2014    Assessment & Plan  65year old male   1. H/o CAD, s/p PCI (? Artery) in 2006, stable  asymptomatic , continue ASA, statin, BB. Normal nuclear stress test in December 2015.   2. Hypertension - uncontrolled -increase lisinopril to 40 mg po daily.  3. Hyperlipidemia - WNL at the last year appointment, elevated LFTs that continue to rise, we will consult with lipid clinic.  4. Smoking cessation - counseled  5. DM - uncontrolled, HbA1c 7.2%, recently started on Metformin.  Follow up in 1 year.    NDorothy Spark MD, FThe Surgical Center Of [facility] Inc1/04/2016, 4:01 PM

## 2015-11-18 DIAGNOSIS — R945 Abnormal results of liver function studies: Secondary | ICD-10-CM

## 2015-11-18 DIAGNOSIS — Z9861 Coronary angioplasty status: Secondary | ICD-10-CM | POA: Insufficient documentation

## 2015-11-18 DIAGNOSIS — R7989 Other specified abnormal findings of blood chemistry: Secondary | ICD-10-CM

## 2015-11-18 DIAGNOSIS — I2510 Atherosclerotic heart disease of native coronary artery without angina pectoris: Secondary | ICD-10-CM | POA: Insufficient documentation

## 2015-11-18 DIAGNOSIS — I2583 Coronary atherosclerosis due to lipid rich plaque: Secondary | ICD-10-CM

## 2015-11-18 HISTORY — DX: Other specified abnormal findings of blood chemistry: R79.89

## 2015-11-22 ENCOUNTER — Telehealth: Payer: Self-pay | Admitting: *Deleted

## 2015-11-22 DIAGNOSIS — R945 Abnormal results of liver function studies: Secondary | ICD-10-CM

## 2015-11-22 DIAGNOSIS — E785 Hyperlipidemia, unspecified: Secondary | ICD-10-CM

## 2015-11-22 DIAGNOSIS — R7989 Other specified abnormal findings of blood chemistry: Secondary | ICD-10-CM

## 2015-11-22 DIAGNOSIS — I10 Essential (primary) hypertension: Secondary | ICD-10-CM

## 2015-11-22 MED ORDER — ATORVASTATIN CALCIUM 20 MG PO TABS: 10.0000 mg | ORAL_TABLET | Freq: Every day | ORAL | Status: DC

## 2015-11-22 NOTE — Telephone Encounter (Signed)
-----   Message from Dorothy Spark, MD sent at 11/22/2015  8:41 AM EST ----- Karlene Einstein,  Please hold lipitor and recheck CMP in 1 month. K  ----- Message -----    From: Aris Georgia, [facility]    Sent: 11/21/2015   3:24 PM      To: Dorothy Spark, MD  I would hold Lipitor for a month and see if they go back to baseline.  That will help [place] know if it is statin related or not.  If they do go back to baseline, can retry with another statin- maybe Crestor 8m daily since LDL still elevated.   SGay Filler ----- Message -----    From: KDorothy Spark MD    Sent: 11/18/2015  10:25 PM      To: SAris Georgia RKearney I have been following this patient, 65yo, DM, CAD, s/p PCI in the past, he has had elevated LFTs, started in 564, in 817 a year ago, so I just watched, now around 100, on 10 mg of atorvastatin daily. His LDL is 101, TG 151 and HDL 37.  Any suggestions? Thank you! KHouston Siren

## 2015-11-22 NOTE — Telephone Encounter (Signed)
Notified the pt that per Dr [name] and our Pharm D Elberta Leatherwood, they recommend that we hold his lipitor x one month and have him come in for a repeat CMET on 12/22/15, for recent elevated LFTs.  Scheduled the pt a lab appt for 12/22/15 at our office to check a CMET.  Instructed him to start holding his Lipitor as of today.  Pt recommends this note with recommendations to be sent to Dr Loanne Drilling.  Informed the pt that I will route this message to Dr Loanne Drilling through epic.  Pt verbalized understanding and agrees with this plan.

## 2015-11-22 NOTE — Telephone Encounter (Signed)
Left a message for the pt to call back to endorse recommendations mentioned below per Dr [name] and Elberta Leatherwood PharmD.

## 2015-12-17 ENCOUNTER — Other Ambulatory Visit: Payer: Self-pay | Admitting: Endocrinology

## 2015-12-21 ENCOUNTER — Other Ambulatory Visit (INDEPENDENT_AMBULATORY_CARE_PROVIDER_SITE_OTHER): Payer: BLUE CROSS/BLUE SHIELD | Admitting: *Deleted

## 2015-12-21 DIAGNOSIS — I10 Essential (primary) hypertension: Secondary | ICD-10-CM | POA: Diagnosis not present

## 2015-12-21 DIAGNOSIS — R7989 Other specified abnormal findings of blood chemistry: Secondary | ICD-10-CM

## 2015-12-21 DIAGNOSIS — E785 Hyperlipidemia, unspecified: Secondary | ICD-10-CM

## 2015-12-21 DIAGNOSIS — R945 Abnormal results of liver function studies: Secondary | ICD-10-CM

## 2015-12-21 LAB — COMPREHENSIVE METABOLIC PANEL
ALT: 67 U/L — ABNORMAL HIGH (ref 9–46)
AST: 61 U/L — ABNORMAL HIGH (ref 10–35)
Albumin: 4 g/dL (ref 3.6–5.1)
Alkaline Phosphatase: 65 U/L (ref 40–115)
BUN: 15 mg/dL (ref 7–25)
CO2: 19 mmol/L — ABNORMAL LOW (ref 20–31)
Calcium: 9.1 mg/dL (ref 8.6–10.3)
Chloride: 107 mmol/L (ref 98–110)
Creat: 0.83 mg/dL (ref 0.70–1.25)
Glucose, Bld: 292 mg/dL — ABNORMAL HIGH (ref 65–99)
Potassium: 4.2 mmol/L (ref 3.5–5.3)
Sodium: 137 mmol/L (ref 135–146)
Total Bilirubin: 0.3 mg/dL (ref 0.2–1.2)
Total Protein: 7.1 g/dL (ref 6.1–8.1)

## 2015-12-22 ENCOUNTER — Other Ambulatory Visit: Payer: BLUE CROSS/BLUE SHIELD

## 2015-12-23 ENCOUNTER — Encounter: Payer: Self-pay | Admitting: Cardiology

## 2015-12-26 ENCOUNTER — Telehealth: Payer: Self-pay | Admitting: Cardiology

## 2015-12-26 ENCOUNTER — Telehealth: Payer: Self-pay | Admitting: *Deleted

## 2015-12-26 DIAGNOSIS — E785 Hyperlipidemia, unspecified: Secondary | ICD-10-CM

## 2015-12-26 DIAGNOSIS — R945 Abnormal results of liver function studies: Secondary | ICD-10-CM

## 2015-12-26 DIAGNOSIS — R7989 Other specified abnormal findings of blood chemistry: Secondary | ICD-10-CM

## 2015-12-26 MED ORDER — ROSUVASTATIN CALCIUM 5 MG PO TABS: 5.0000 mg | ORAL_TABLET | Freq: Every day | ORAL | Status: DC

## 2015-12-26 NOTE — Telephone Encounter (Signed)
New message     Patient returning call back to nurse from today

## 2015-12-26 NOTE — Telephone Encounter (Signed)
Expand All Collapse All    No, we will try rosuvastatin 5 mg po daily instead, and recheck CMP in 1 month. If still elevated we will refer to lipid clinic for [place] 9 inhibitors     Notified the pt of above recommendations per Dr [name], based on the pts recent lab results from 12/21/15.  Informed the pt that Dr [name] would like for him to start taking rosuvastatin 5 mg po daily and have repeat CMET in one month.  Informed the pt that per Dr [name], if still elevated we will refer to lipid clinic for [place] 9 inhibitors.  Scheduled the pt a lab appt for 01/23/16 at our office.  Confirmed the pharmacy of choice with the pt.  Pt verbalized understanding and agrees with this plan.

## 2015-12-26 NOTE — Telephone Encounter (Signed)
No, we will try rosuvastatin 5 mg po daily instead, and recheck CMP in 1 month. If still elevated we will refer to lipid clinic for [place] 9 inhibitors     Notified the pt of above recommendations per Dr [name], based on the pts recent lab results from 12/21/15.  Informed the pt that Dr [name] would like for him to start taking rosuvastatin 5 mg po daily and have repeat CMET in one month.  Informed the pt that per Dr [name], if still elevated we will refer to lipid clinic for [place] 9 inhibitors.  Scheduled the pt a lab appt for 01/23/16 at our office.  Confirmed the pharmacy of choice with the pt.  Pt verbalized understanding and agrees with this plan.

## 2016-01-23 ENCOUNTER — Other Ambulatory Visit (INDEPENDENT_AMBULATORY_CARE_PROVIDER_SITE_OTHER): Payer: BLUE CROSS/BLUE SHIELD | Admitting: *Deleted

## 2016-01-23 ENCOUNTER — Ambulatory Visit (INDEPENDENT_AMBULATORY_CARE_PROVIDER_SITE_OTHER): Payer: BLUE CROSS/BLUE SHIELD | Admitting: Internal Medicine

## 2016-01-23 ENCOUNTER — Encounter: Payer: Self-pay | Admitting: Internal Medicine

## 2016-01-23 VITALS — BP 120/66 | HR 64 | Ht 67.25 in | Wt 221.2 lb

## 2016-01-23 DIAGNOSIS — R7989 Other specified abnormal findings of blood chemistry: Secondary | ICD-10-CM | POA: Diagnosis not present

## 2016-01-23 DIAGNOSIS — E785 Hyperlipidemia, unspecified: Secondary | ICD-10-CM | POA: Diagnosis not present

## 2016-01-23 DIAGNOSIS — R945 Abnormal results of liver function studies: Secondary | ICD-10-CM

## 2016-01-23 DIAGNOSIS — K219 Gastro-esophageal reflux disease without esophagitis: Secondary | ICD-10-CM

## 2016-01-23 LAB — COMPREHENSIVE METABOLIC PANEL
ALT: 66 U/L — ABNORMAL HIGH (ref 9–46)
AST: 75 U/L — ABNORMAL HIGH (ref 10–35)
Albumin: 4.3 g/dL (ref 3.6–5.1)
Alkaline Phosphatase: 63 U/L (ref 40–115)
BUN: 19 mg/dL (ref 7–25)
CO2: 24 mmol/L (ref 20–31)
Calcium: 9.7 mg/dL (ref 8.6–10.3)
Chloride: 101 mmol/L (ref 98–110)
Creat: 0.76 mg/dL (ref 0.70–1.25)
Glucose, Bld: 233 mg/dL — ABNORMAL HIGH (ref 65–99)
Potassium: 4.7 mmol/L (ref 3.5–5.3)
Sodium: 138 mmol/L (ref 135–146)
Total Bilirubin: 0.4 mg/dL (ref 0.2–1.2)
Total Protein: 7.1 g/dL (ref 6.1–8.1)

## 2016-01-23 MED ORDER — ESOMEPRAZOLE MAGNESIUM 20 MG PO CPDR: 20.0000 mg | DELAYED_RELEASE_CAPSULE | Freq: Every day | ORAL | Status: DC

## 2016-01-23 NOTE — Progress Notes (Signed)
   Subjective:    Patient ID: [name] [name], male    DOB: 01/21/1951, 65 y.o.   MRN: [id number] Chief complaint: Follow-up reflux HPI  The patient returns, he has been on Nexium 40 mg daily with good results. He tried to stop it but he couldn't go for 2 days or so before getting heartburn. He admits some concern about the possibility of kidney disease from Nexium. Medications, allergies, past medical history, past surgical history, family history and social history are reviewed and updated in the EMR.  Review of Systems As above    Objective:   Physical Exam BP 120/66 mmHg  Pulse 64  Ht 5' 7.25" (1.708 m)  Wt 221 lb 4 oz (100.358 kg)  BMI 34.40 kg/m2 No acute distress    Assessment & Plan:   1. Gastroesophageal reflux disease without esophagitis    He's had chronic problems and I think he needs a chronic PPI. Since he's had a prior endoscopy that did not show any signs of Barrett's esophagus he does not need a repeat endoscopy. We will try to reduce the dose of generic Nexium deceiving get by on 20 mg. If not will go back to 40. We reviewed the possible potential side effects of bone loss, mentioned and kidney disease. I explained that these are associations and it's difficult to sort this out and that it sounds to me like he has a good indication for chronic PPI. He wondered if he should have own density testing or to determine if he has thin bones I asked him to check with Dr. Loanne Drilling, the patient does suffer from hypogonadism and maybe there are some criteria to do that though I don't think chronic PPI use is 1 necessarily. I will see him back as needed.   15 minutes time spent with patient > half in counseling coordination of care

## 2016-01-23 NOTE — Patient Instructions (Signed)
We have sent the following medications to your pharmacy for you to pick up at your convenience: Nexium, we are decreasing your dose.  Please check with Dr Loanne Drilling to see if you need a bone density test done.    I appreciate the opportunity to care for you. Silvano Rusk, MD, [facility]

## 2016-02-02 ENCOUNTER — Ambulatory Visit (INDEPENDENT_AMBULATORY_CARE_PROVIDER_SITE_OTHER): Payer: BLUE CROSS/BLUE SHIELD | Admitting: Pharmacist

## 2016-02-02 DIAGNOSIS — E785 Hyperlipidemia, unspecified: Secondary | ICD-10-CM

## 2016-02-02 MED ORDER — ROSUVASTATIN CALCIUM 10 MG PO TABS: 10.0000 mg | ORAL_TABLET | Freq: Every day | ORAL | Status: DC

## 2016-02-02 NOTE — Progress Notes (Signed)
Patient ID: [name] [name], male   DOB: August 18, 1951, 65 y.o.   MRN: [id number]     Patient Name: [name] [name] Date of Encounter: 02/02/2016  Primary Care Provider:  [name], MD Primary Cardiologist: [name], MD  HPI  [name] is a 65 yo pt of [name] who was referred to the Lipid clinic for elevated LFTs on statin therapy. He has a PMH significant for CAD s/p PCI in 2006,  HTN, and DM.  He has been treated with statins for several years.  He was taking Lipitor 20 mg daily and then his LFTs increased with AST 124 and ALT 105.  The lipitor was stopped and repeat LFTs in Feb 2017 were still slightly elevated with AST 61 and ALT 67.  This was close to his historical baseline.  Pt does have extra abdominal fat so ? Some issue with fatty liver.  Abdominal ultrasound in 2013 suggested fatty infiltration.   Pt was placed on Crestor 51m daily in February.  Repeat LFTs on Crestor were similar to his new baseline.    RF: CAD, DM LDL goal < 70 mg/dL Current therapy: Crestor 514mdaily Intolerance: Lipitor 2082mLFT increase   Labs:  01/23/16- AST 75, ALT 66 (on Crestor 5mg30m/9/17- AST 61, ALT 67 (on no therapy) 10/18/15- AST 124, ALT 105, TC 169, TG 151, HDL 38, LDL 101 (on Lipitor 20mg1mast Medical History  Diagnosis Date  . CORONARY ARTERY DISEASE   . HYPERLIPIDEMIA   . ANEMIA-IRON DEFICIENCY   . GERD   . DIVERTICULOSIS, COLON   . BACK PAIN, LUMBAR   . Unspecified disorder of urethra and urinary tract   . HYPERTENSION     dr tom wall  . DM     on no medication ?prediabetes  . Chronic kidney disease     kidney stone  . Arthritis   . PONV (postoperative nausea and vomiting)     little nausea after having his hip replacement  . Hepatitis     States he was tested for Hep B and he had antibodies   Current Outpatient Prescriptions  Medication Sig Dispense Refill  . Ascorbic Acid (VITAMIN C) 1000 MG tablet Take 2,000 mg by mouth daily.    . ateMarland Kitchenolol (TENORMIN) 25 MG  tablet TAKE 1 TABLET BY MOUTH EVERY DAY 90 tablet 3  . b complex vitamins tablet Take 1 tablet by mouth daily.    . Cholecalciferol (VITAMIN D) 2000 UNITS tablet Take 2,000 Units by mouth daily.    . clomiPHENE (CLOMID) 50 MG tablet Take 50 mg by mouth daily.    . Coenzyme Q10 (CO Q 10) 100 MG CAPS Take 100 mg by mouth daily.     . Cyanocobalamin (VITAMIN B-12 PO) Take 1 tablet by mouth daily.    . DHA-EPA-VIT B6-B12-FOLIC ACID PO Take 1 tablet by mouth daily.     . esoMarland Kitcheneprazole (NEXIUM) 20 MG capsule Take 1 capsule (20 mg total) by mouth daily at 12 noon. 30 capsule 11  . fluticasone (FLONASE) 50 MCG/ACT nasal spray Place 2 sprays into both nostrils daily. 16 g 6  . FOLIC ACID PO Take 1 tablet by mouth daily.    . Ginkgo Biloba (GINKOBA PO) Take 1 tablet by mouth daily.    . hydrochlorothiazide (HYDRODIURIL) 25 MG tablet Take 1 tablet (25 mg total) by mouth daily. 90 tablet 3  . lisinopril (PRINIVIL,ZESTRIL) 40 MG tablet Take 1 tablet (40 mg total) by mouth  daily. 90 tablet 3  . Magnesium 400 MG CAPS Take 400 mg by mouth daily.     . meloxicam (MOBIC) 15 MG tablet Take 15 mg by mouth daily as needed for pain.     . metFORMIN (GLUCOPHAGE-XR) 500 MG 24 hr tablet Take 2 tablets (1,000 mg total) by mouth daily with breakfast. 60 tablet 11  . Multiple Vitamin (MULTIVITAMIN) tablet Take 1 tablet by mouth daily.    . nitroGLYCERIN (NITROSTAT) 0.4 MG SL tablet Place 1 tablet (0.4 mg total) under the tongue every 5 (five) minutes as needed for chest pain. 30 tablet 11  . rosuvastatin (CRESTOR) 10 MG tablet Take 1 tablet (10 mg total) by mouth daily. 30 tablet 6  . VITAMIN A PO Take 1 tablet by mouth every other day.    . vitamin E 100 UNIT capsule Take 100 Units by mouth daily.    . Zinc 50 MG TABS Take 50 mg by mouth daily.     No current facility-administered medications for this visit.    Family History  Family History  Problem Relation Age of Onset  . Colon cancer Mother     Colon  Cancer-at advanced age  . Breast cancer Sister   . Heart disease Father     Social History  Social History   Social History  . Marital Status: Single    Spouse Name: N/A  . Number of Children: 0  . 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 Smoking status: Current Every Day Smoker -- 1.00 packs/day for 44 years    Types: Cigarettes  . Smokeless tobacco: Never Used  . Alcohol Use: 0.0 oz/week    0 Standard drinks or equivalent per week     Comment: rare  . Drug Use: No  . Sexual Activity: Not on file   Other Topics Concern  . Not on file   Social History Narrative    Assessment & Plan  65 year old male   1. Hyperlipidemia- Pt's LFTs stable on Crestor.  Baseline elevated likely fatty liver and so not a contraindication for statin therapy.  Pt has history of CAD and DM so would like to keep on statin if at all possible.  LDL has not been checked on Crestor but was elevated on Lipitor 68m.  Unlikely 533mof Crestor will have him reach his goal.  Will increase to 1081mnd recheck LDL and LFTS in 1 month.  If LFTs increase, will consider PCSK-9 inhibitor therapy at that time.    Keaundre Thelin R,PharmD  02/02/2016, 2:07 PM

## 2016-02-02 NOTE — Patient Instructions (Signed)
Increase Crestor to 74m daily.  If you have any problems, please call SGay Fillerat [phone]   We will recheck your blood work in 1 month.

## 2016-02-27 ENCOUNTER — Other Ambulatory Visit (INDEPENDENT_AMBULATORY_CARE_PROVIDER_SITE_OTHER): Payer: BLUE CROSS/BLUE SHIELD | Admitting: *Deleted

## 2016-02-27 DIAGNOSIS — E785 Hyperlipidemia, unspecified: Secondary | ICD-10-CM | POA: Diagnosis not present

## 2016-02-28 ENCOUNTER — Telehealth: Payer: Self-pay | Admitting: Cardiology

## 2016-02-28 DIAGNOSIS — E785 Hyperlipidemia, unspecified: Secondary | ICD-10-CM

## 2016-02-28 DIAGNOSIS — R945 Abnormal results of liver function studies: Secondary | ICD-10-CM

## 2016-02-28 DIAGNOSIS — R7989 Other specified abnormal findings of blood chemistry: Secondary | ICD-10-CM

## 2016-02-28 LAB — LIPID PANEL
Cholesterol: 151 mg/dL (ref 125–200)
HDL: 32 mg/dL — ABNORMAL LOW (ref >=40)
LDL Cholesterol: 83 mg/dL (ref <130)
Total CHOL/HDL Ratio: 4.7 Ratio (ref <=5.0)
Triglycerides: 181 mg/dL — ABNORMAL HIGH (ref <150)
VLDL: 36 mg/dL — ABNORMAL HIGH (ref <30)

## 2016-02-28 LAB — HEPATIC FUNCTION PANEL
ALT: 65 U/L — ABNORMAL HIGH (ref 9–46)
AST: 78 U/L — ABNORMAL HIGH (ref 10–35)
Albumin: 4.4 g/dL (ref 3.6–5.1)
Alkaline Phosphatase: 60 U/L (ref 40–115)
Bilirubin, Direct: 0.1 mg/dL (ref <=0.2)
Indirect Bilirubin: 0.3 mg/dL (ref 0.2–1.2)
Total Bilirubin: 0.4 mg/dL (ref 0.2–1.2)
Total Protein: 7 g/dL (ref 6.1–8.1)

## 2016-02-28 NOTE — Telephone Encounter (Signed)
-----   Message from Dorothy Spark, MD sent at 02/28/2016  8:38 AM EDT ----- Karlene Einstein, Please tell him that his LFTs remain the same, but we will need to recheck CMP in 3 months. Thank you, K  ----- Message -----    From: Aris Georgia, [facility]    Sent: 02/28/2016   8:33 AM      To: Dorothy Spark, MD  I am ok with this elevation since it is stable.  He had elevated LFTs in 2013 and CT showed fatty liver.  His LDL may decrease further on 8m of Crestor so would keep his dose the same for now and recheck in 3 months.   ----- Message -----    From: KDorothy Spark MD    Sent: 02/28/2016   7:27 AM      To: SAris Georgia RPH  HValarie Merino  His LFTs are still elevated, is it acceptable?  This was your last note:  1. Hyperlipidemia- Pt's LFTs stable on Crestor. Baseline elevated likely fatty liver and so not a contraindication for statin therapy. Pt has history of CAD and DM so would like to keep on statin if at all possible. LDL has not been checked on Crestor but was elevated on Lipitor 268m Unlikely 48m248mf Crestor will have him reach his goal. Will increase to 54m28md recheck LDL and LFTS in 1 month. If LFTs increase, will consider PCSK-9 inhibitor therapy at that time.   KataHouston Siren---- Message -----    From: [facility] Interface    Sent: 02/28/2016  12:58 AM      To: KataDorothy Spark

## 2016-02-28 NOTE — Telephone Encounter (Signed)
New message     Returning a call to the nurse to get lab results

## 2016-02-28 NOTE — Telephone Encounter (Signed)
Notified the pt that per Dr [name] and Elberta Leatherwood PharmD, his LFTs remain the same (elevated), and they both recommend that we recheck a CMET in 3 months.  Advised the pt to continue with his current med regimen.  Scheduled the pt a lab appt for 05/29/16 at our office to check a cmet.  Pt verbalized understanding and agrees with this plan.

## 2016-04-17 ENCOUNTER — Encounter: Payer: Self-pay | Admitting: Endocrinology

## 2016-04-17 ENCOUNTER — Ambulatory Visit (INDEPENDENT_AMBULATORY_CARE_PROVIDER_SITE_OTHER): Payer: BLUE CROSS/BLUE SHIELD | Admitting: Endocrinology

## 2016-04-17 VITALS — BP 132/80 | HR 80 | Ht 67.25 in | Wt 219.0 lb

## 2016-04-17 DIAGNOSIS — I10 Essential (primary) hypertension: Secondary | ICD-10-CM

## 2016-04-17 DIAGNOSIS — E119 Type 2 diabetes mellitus without complications: Secondary | ICD-10-CM

## 2016-04-17 DIAGNOSIS — E785 Hyperlipidemia, unspecified: Secondary | ICD-10-CM

## 2016-04-17 LAB — POCT GLYCOSYLATED HEMOGLOBIN (HGB A1C): Hemoglobin A1C: 7.8

## 2016-04-17 MED ORDER — HYDROCHLOROTHIAZIDE 25 MG PO TABS: 12.5000 mg | ORAL_TABLET | Freq: Every day | ORAL | Status: DC

## 2016-04-17 MED ORDER — GLUCOSE BLOOD VI STRP
1.0000 | ORAL_STRIP | Freq: Every day | Status: DC
Start: 2016-04-17 — End: 2017-04-15

## 2016-04-17 MED ORDER — CANAGLIFLOZIN 100 MG PO TABS: 100.0000 mg | ORAL_TABLET | Freq: Every day | ORAL | Status: DC

## 2016-04-17 NOTE — Progress Notes (Signed)
Subjective:    Patient ID: [name] [name], male    DOB: 18-Nov-1950, 65 y.o.   MRN: [id number]  HPI Pt returns for f/u of DM. DM type: 2 Dx'ed: 6063 Complications: CAD Therapy: metformin DKA: never Severe hypoglycemia: never.  Pancreatitis: never Other: he has never been on insulin; pioglitizone was favored due to NASH, but he did not tolerate (weight gain).   Interval history: he says cbg's are well-controlled.   Past Medical History  Diagnosis Date  . CORONARY ARTERY DISEASE   . HYPERLIPIDEMIA   . ANEMIA-IRON DEFICIENCY   . GERD   . DIVERTICULOSIS, COLON   . BACK PAIN, LUMBAR   . Unspecified disorder of urethra and urinary tract   . HYPERTENSION     dr tom wall  . DM     on no medication ?prediabetes  . Chronic kidney disease     kidney stone  . Arthritis   . PONV (postoperative nausea and vomiting)     little nausea after having his hip replacement  . Hepatitis     States he was tested for Hep B and he had antibodies    Past Surgical History  Procedure Laterality Date  . Esophagogastroduodenoscopy  11/07/2005  . Stress cardiolite  11/30/2004  . Electrocardiogram  03/30/2007  . Kidney stones    . Total hip arthroplasty  10/28/2012    Procedure: TOTAL HIP ARTHROPLASTY;  Surgeon: [name], MD;  Location: [place];  Service: Orthopedics;  Laterality: Right;  . Coronary angioplasty with stent placement      06    dr t. wall  . Lumbar laminectomy Right 05/18/2014    Procedure: LUMBAR LAMINECTOMY Microdiscectomy Right Lumbar five - sacral one;  Surgeon: [name], MD;  Location: [place];  Service: Orthopedics;  Laterality: Right;  . Colonoscopy      Social History   Social History  . Marital Status: Single    Spouse Name: N/A  . Number of Children: 0  . 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 Smoking status: Current Every Day Smoker -- 1.00 packs/day for 44 years    Types:  Cigarettes  . Smokeless tobacco: Never Used  . Alcohol Use: 0.0 oz/week    0 Standard drinks or equivalent per week     Comment: rare  . Drug Use: No  . Sexual Activity: Not on file   Other Topics Concern  . Not on file   Social History Narrative    Current Outpatient Prescriptions on File Prior to Visit  Medication Sig Dispense Refill  . Ascorbic Acid (VITAMIN C) 1000 MG tablet Take 2,000 mg by mouth daily.    Marland Kitchen atenolol (TENORMIN) 25 MG tablet TAKE 1 TABLET BY MOUTH EVERY DAY 90 tablet 3  . b complex vitamins tablet Take 1 tablet by mouth daily.    . Cholecalciferol (VITAMIN D) 2000 UNITS tablet Take 2,000 Units by mouth daily.    . clomiPHENE (CLOMID) 50 MG tablet Take 50 mg by mouth daily.    . Coenzyme Q10 (CO Q 10) 100 MG CAPS Take 100 mg by mouth daily.     . Cyanocobalamin (VITAMIN B-12 PO) Take 1 tablet by mouth daily.    . DHA-EPA-VIT B6-B12-FOLIC ACID PO Take 1 tablet by mouth daily.     Marland Kitchen esomeprazole (NEXIUM) 20 MG capsule Take 1 capsule (20 mg  total) by mouth daily at 12 noon. 30 capsule 11  . fluticasone (FLONASE) 50 MCG/ACT nasal spray Place 2 sprays into both nostrils daily. 16 g 6  . FOLIC ACID PO Take 1 tablet by mouth daily.    . Ginkgo Biloba (GINKOBA PO) Take 1 tablet by mouth daily.    Marland Kitchen lisinopril (PRINIVIL,ZESTRIL) 40 MG tablet Take 1 tablet (40 mg total) by mouth daily. 90 tablet 3  . Magnesium 400 MG CAPS Take 400 mg by mouth daily.     . meloxicam (MOBIC) 15 MG tablet Take 15 mg by mouth daily as needed for pain.     . metFORMIN (GLUCOPHAGE-XR) 500 MG 24 hr tablet Take 2 tablets (1,000 mg total) by mouth daily with breakfast. 60 tablet 11  . Multiple Vitamin (MULTIVITAMIN) tablet Take 1 tablet by mouth daily.    . nitroGLYCERIN (NITROSTAT) 0.4 MG SL tablet Place 1 tablet (0.4 mg total) under the tongue every 5 (five) minutes as needed for chest pain. 30 tablet 11  . rosuvastatin (CRESTOR) 10 MG tablet Take 1 tablet (10 mg total) by mouth daily. 30 tablet  6  . VITAMIN A PO Take 1 tablet by mouth every other day.    . vitamin E 100 UNIT capsule Take 100 Units by mouth daily.    . Zinc 50 MG TABS Take 50 mg by mouth daily.     No current facility-administered medications on file prior to visit.    Allergies  Allergen Reactions  . Actos [Pioglitazone Hydrochloride]     Weight gain    Family History  Problem Relation Age of Onset  . Colon cancer Mother     Colon Cancer-at advanced age  . Breast cancer Sister   . Heart disease Father     BP 132/80 mmHg  Pulse 80  Ht 5' 7.25" (1.708 m)  Wt 219 lb (99.338 kg)  BMI 34.05 kg/m2  SpO2 96%  Review of Systems He denies edema.    Objective:   Physical Exam VITAL SIGNS:  See vs page GENERAL: no distress Pulses: dorsalis pedis intact bilat.   MSK: no deformity of the feet CV: no leg edema Skin:  no ulcer on the feet.  normal color and temp on the feet. Neuro: sensation is intact to touch on the feet  A1c=7.8%    Assessment & Plan:  Type 2 DM: worse.  HTN: well-controlled, but with the addition of invokana, he'll need less HCTZ.   Patient is advised the following: Patient Instructions  i have sent a prescription to your pharmacy, to add invokana.   Please reduce the HCTZ. Please come back for a regular physical appointment in 6 months (must be after 10/17/16).     [name], MD

## 2016-04-17 NOTE — Patient Instructions (Signed)
i have sent a prescription to your pharmacy, to add invokana.   Please reduce the HCTZ. Please come back for a regular physical appointment in 6 months (must be after 10/17/16).

## 2016-04-22 ENCOUNTER — Other Ambulatory Visit: Payer: Self-pay | Admitting: Endocrinology

## 2016-04-22 NOTE — Telephone Encounter (Signed)
Please advise if ok to refill. Rx is listed under a historical provider. Thanks!

## 2016-04-27 ENCOUNTER — Encounter: Payer: Self-pay | Admitting: Endocrinology

## 2016-04-29 ENCOUNTER — Other Ambulatory Visit: Payer: Self-pay

## 2016-04-29 MED ORDER — CLOMIPHENE CITRATE 50 MG PO TABS: ORAL_TABLET | ORAL | Status: DC

## 2016-04-30 NOTE — Telephone Encounter (Signed)
[facility] will not fill prescription for clomiPHENE (CLOMID) 50 MG tablet,is it possible for Dr Loanne Drilling to send in a prescription for generic brand testosterone cream. Please advise

## 2016-05-01 ENCOUNTER — Other Ambulatory Visit: Payer: Self-pay | Admitting: Endocrinology

## 2016-05-01 MED ORDER — CLOMIPHENE CITRATE 50 MG PO TABS: ORAL_TABLET | ORAL | Status: DC

## 2016-05-03 ENCOUNTER — Telehealth: Payer: Self-pay

## 2016-05-03 NOTE — Telephone Encounter (Signed)
There is no alternative [facility]

## 2016-05-03 NOTE — Telephone Encounter (Signed)
Received a fax from [facility] stating clomiphene is currently unavailable and has been for some time. Please change the medication.

## 2016-05-03 NOTE — Telephone Encounter (Signed)
Pt called back and stated the medication is too [facility] and does not no how to proceed from here.

## 2016-05-03 NOTE — Telephone Encounter (Signed)
Clomiphene is too expensive anywhere but [facility]

## 2016-05-03 NOTE — Telephone Encounter (Signed)
I contacted the pt and left a vm advising of note below. Requested a call back from the pt to verify how he would like to proceed.

## 2016-05-05 NOTE — Telephone Encounter (Signed)
[facility] 15 [facility] for $24.  Taking 1/4 tab per day, this $24 would last you 2 months.  Do you want me to send to a [place]?

## 2016-05-06 NOTE — Telephone Encounter (Signed)
Ok, so that would make your monthly cost approx $50.  Let me know if you want to send rx

## 2016-05-06 NOTE — Telephone Encounter (Signed)
I contacted the pt and advised of note below. Requested a call back to verify.

## 2016-05-06 NOTE — Telephone Encounter (Signed)
Pt called back and stated his dosage has been increased to 1 tablet daily, please advise, Thanks!

## 2016-05-06 NOTE — Telephone Encounter (Signed)
Pt stated 50$ was too expensive.

## 2016-05-07 MED ORDER — CLOMIPHENE CITRATE 50 MG PO TABS: ORAL_TABLET | ORAL | Status: DC

## 2016-05-07 NOTE — Telephone Encounter (Signed)
I contacted the pt. He stated he would like to the rx to [facility] [address]. Rx submitted and pt notified.

## 2016-05-07 NOTE — Telephone Encounter (Signed)
Please let me know if you want this rx.  Alternatives are more expensive

## 2016-05-29 ENCOUNTER — Other Ambulatory Visit (INDEPENDENT_AMBULATORY_CARE_PROVIDER_SITE_OTHER): Payer: BLUE CROSS/BLUE SHIELD | Admitting: *Deleted

## 2016-05-29 DIAGNOSIS — E785 Hyperlipidemia, unspecified: Secondary | ICD-10-CM | POA: Diagnosis not present

## 2016-05-29 DIAGNOSIS — R945 Abnormal results of liver function studies: Secondary | ICD-10-CM

## 2016-05-29 DIAGNOSIS — R7989 Other specified abnormal findings of blood chemistry: Secondary | ICD-10-CM

## 2016-05-29 LAB — COMPREHENSIVE METABOLIC PANEL
ALT: 59 U/L — ABNORMAL HIGH (ref 9–46)
AST: 54 U/L — ABNORMAL HIGH (ref 10–35)
Albumin: 4.3 g/dL (ref 3.6–5.1)
Alkaline Phosphatase: 59 U/L (ref 40–115)
BUN: 14 mg/dL (ref 7–25)
CO2: 19 mmol/L — ABNORMAL LOW (ref 20–31)
Calcium: 9.4 mg/dL (ref 8.6–10.3)
Chloride: 106 mmol/L (ref 98–110)
Creat: 0.81 mg/dL (ref 0.70–1.25)
Glucose, Bld: 226 mg/dL — ABNORMAL HIGH (ref 65–99)
Potassium: 4.5 mmol/L (ref 3.5–5.3)
Sodium: 141 mmol/L (ref 135–146)
Total Bilirubin: 0.3 mg/dL (ref 0.2–1.2)
Total Protein: 7.1 g/dL (ref 6.1–8.1)

## 2016-06-04 ENCOUNTER — Encounter: Payer: Self-pay | Admitting: Cardiology

## 2016-06-04 ENCOUNTER — Telehealth: Payer: Self-pay | Admitting: Cardiology

## 2016-06-04 DIAGNOSIS — I2583 Coronary atherosclerosis due to lipid rich plaque: Secondary | ICD-10-CM

## 2016-06-04 DIAGNOSIS — I2510 Atherosclerotic heart disease of native coronary artery without angina pectoris: Secondary | ICD-10-CM

## 2016-06-04 DIAGNOSIS — R945 Abnormal results of liver function studies: Principal | ICD-10-CM

## 2016-06-04 DIAGNOSIS — R7989 Other specified abnormal findings of blood chemistry: Secondary | ICD-10-CM

## 2016-06-04 NOTE — Telephone Encounter (Signed)
Notified the pt that per Elberta Leatherwood PharmD in lipid clinic, the pts labs showed that his LFTs are still elevated but better than previous check on Crestor.  Informed the pt that per Gay Filler, his baseline LFT ultrasound showed fatty infiltrates, and she recommends that he continue his current therapy with Crestor. Informed the pt that Dr [name] has not reviewed his labs yet, but I will go ahead and order his repeat labs and schedule him a lab appt for 3 months out, to check lipids and a cmet.  Scheduled the pts repeat lipid and cmet for 09/03/16, as pt requested.  Informed the pt that he needs to come fasting to this lab appt.  Informed the pt that if Dr [name] orders in addition to current plan, I will notify him of that.  Pt verbalized understanding and agrees with this plan.

## 2016-06-04 NOTE — Telephone Encounter (Signed)
-----   Message from Aris Georgia, [facility] sent at 06/03/2016  2:32 PM EDT ----- LFTs still elevated but better than previous check on Crestor.  His baseline LFTs are slightly above normal and previous ultrasound showed fatty infiltrates.  Would continue current therapy with Crestor and recheck lipids/liver in 3 months.

## 2016-06-04 NOTE — Telephone Encounter (Signed)
New Message  Pt call requesting to speak with RN about lab results, please call back to discuss

## 2016-08-31 ENCOUNTER — Other Ambulatory Visit: Payer: Self-pay | Admitting: Cardiology

## 2016-09-03 ENCOUNTER — Other Ambulatory Visit: Payer: BLUE CROSS/BLUE SHIELD | Admitting: *Deleted

## 2016-09-03 DIAGNOSIS — R945 Abnormal results of liver function studies: Principal | ICD-10-CM

## 2016-09-03 DIAGNOSIS — I2510 Atherosclerotic heart disease of native coronary artery without angina pectoris: Secondary | ICD-10-CM

## 2016-09-03 DIAGNOSIS — I2583 Coronary atherosclerosis due to lipid rich plaque: Secondary | ICD-10-CM

## 2016-09-03 DIAGNOSIS — R7989 Other specified abnormal findings of blood chemistry: Secondary | ICD-10-CM

## 2016-09-03 LAB — COMPREHENSIVE METABOLIC PANEL
ALT: 79 U/L — ABNORMAL HIGH (ref 9–46)
AST: 75 U/L — ABNORMAL HIGH (ref 10–35)
Albumin: 4.4 g/dL (ref 3.6–5.1)
Alkaline Phosphatase: 57 U/L (ref 40–115)
BUN: 18 mg/dL (ref 7–25)
CO2: 21 mmol/L (ref 20–31)
Calcium: 9.4 mg/dL (ref 8.6–10.3)
Chloride: 106 mmol/L (ref 98–110)
Creat: 0.86 mg/dL (ref 0.70–1.25)
Glucose, Bld: 148 mg/dL — ABNORMAL HIGH (ref 65–99)
Potassium: 4.5 mmol/L (ref 3.5–5.3)
Sodium: 139 mmol/L (ref 135–146)
Total Bilirubin: 0.3 mg/dL (ref 0.2–1.2)
Total Protein: 7.1 g/dL (ref 6.1–8.1)

## 2016-09-03 LAB — LIPID PANEL
Cholesterol: 145 mg/dL (ref 125–200)
HDL: 41 mg/dL (ref >=40)
LDL Cholesterol: 79 mg/dL (ref <130)
Total CHOL/HDL Ratio: 3.5 Ratio (ref <=5.0)
Triglycerides: 126 mg/dL (ref <150)
VLDL: 25 mg/dL (ref <30)

## 2016-09-05 ENCOUNTER — Telehealth: Payer: Self-pay | Admitting: *Deleted

## 2016-09-05 MED ORDER — ROSUVASTATIN CALCIUM 5 MG PO TABS: 5.0000 mg | ORAL_TABLET | ORAL | 2 refills | Status: DC

## 2016-09-05 NOTE — Telephone Encounter (Signed)
Notified the pt that per Dr [name], his labs showed elevated LFTs, and she recommends that we decrease rosuvastatin to 5 mg po three times a week.  Confirmed the pharmacy of choice with the pt.  Pt verbalized understanding and agrees with this plan.   Pt did however want to ask Dr [name], and Pharmacist in lipid clinic, if he is still being considered for PCSK-9 Inhibitors?  Informed the pt that I will route this message to both Dr [name] and Fuller [place] Pharmacist for further review and recommendation, and someone will follow-up with the pt thereafter.  Pt verbalized understanding and agrees with this plan.

## 2016-09-05 NOTE — Telephone Encounter (Signed)
-----   Message from Dorothy Spark, MD sent at 09/05/2016  1:04 PM EDT ----- Elevated LFTs, decrease rosuvastatin to 5 mg po three times a week.   ----- Message ----- From: Interface, [facility] Sent: 09/03/2016  11:54 PM To: Dorothy Spark, MD

## 2016-09-06 NOTE — Telephone Encounter (Signed)
With LDL so close to goal and in the 70s, PCSK9i unlikely to be covered. Would have pt continue on Crestor and add Zetia 44m daily. This should bring his LDL to goal and will be more cost affordable in the long run.

## 2016-09-09 NOTE — Telephone Encounter (Signed)
Dr [name], would you like to add Zetia or keep to regimen you advised on last week, of decreasing his Crestor to 5 mg po three times weekly? Thanks!

## 2016-09-09 NOTE — Telephone Encounter (Signed)
I would just do crestor

## 2016-09-09 NOTE — Telephone Encounter (Signed)
Left a detailed message on the pts confirmed VM, that per Dr [name] and Fuller [place] PharmD, being his LDL is so close to goal and in the 70's, PCSK 9-inhibitors unlikely to be covered.  Left a detailed message for the pt to continue as advised by Dr [name], and take Crestor 5 mg po three times weekly.  Left a detailed message for the pt to call back, with any additional questions regarding this recommendation.

## 2016-10-16 ENCOUNTER — Encounter: Payer: BLUE CROSS/BLUE SHIELD | Admitting: Endocrinology

## 2016-10-18 ENCOUNTER — Encounter: Payer: BLUE CROSS/BLUE SHIELD | Admitting: Endocrinology

## 2016-10-27 ENCOUNTER — Other Ambulatory Visit: Payer: Self-pay | Admitting: Endocrinology

## 2016-11-10 ENCOUNTER — Other Ambulatory Visit: Payer: Self-pay | Admitting: Cardiology

## 2016-11-10 DIAGNOSIS — I10 Essential (primary) hypertension: Secondary | ICD-10-CM

## 2016-11-10 DIAGNOSIS — E785 Hyperlipidemia, unspecified: Secondary | ICD-10-CM

## 2016-11-14 ENCOUNTER — Telehealth: Payer: Self-pay | Admitting: Endocrinology

## 2016-11-14 MED ORDER — CANAGLIFLOZIN 100 MG PO TABS: 100.0000 mg | ORAL_TABLET | Freq: Every day | ORAL | 11 refills | Status: DC

## 2016-11-14 NOTE — Telephone Encounter (Signed)
Refill submitted. 

## 2016-11-14 NOTE — Telephone Encounter (Signed)
Pt needs his Invokana prescription sent into the [facility] on [address].

## 2016-11-17 NOTE — Progress Notes (Signed)
Subjective:    Patient ID: [name] [name], male    DOB: February 16, 1951, 66 y.o.   MRN: [id number]  HPI Pt is here for regular wellness examination, and is feeling pretty well in general, and says chronic med probs are stable, except as noted below Past Medical History:  Diagnosis Date  . ANEMIA-IRON DEFICIENCY   . Arthritis   . BACK PAIN, LUMBAR   . Chronic kidney disease    kidney stone  . CORONARY ARTERY DISEASE   . DIVERTICULOSIS, COLON   . DM    on no medication ?prediabetes  . Elevated LFTs 11/18/2015  . GERD   . Hepatitis    States he was tested for Hep B and he had antibodies  . HYPERLIPIDEMIA   . HYPERTENSION    dr tom wall  . PONV (postoperative nausea and vomiting)    little nausea after having his hip replacement  . Unspecified disorder of urethra and urinary tract     Past Surgical History:  Procedure Laterality Date  . COLONOSCOPY    . CORONARY ANGIOPLASTY WITH STENT PLACEMENT     06    dr t. wall  . ELECTROCARDIOGRAM  03/30/2007  . ESOPHAGOGASTRODUODENOSCOPY  11/07/2005  . kidney stones    . LUMBAR LAMINECTOMY Right 05/18/2014   Procedure: LUMBAR LAMINECTOMY Microdiscectomy Right Lumbar five - sacral one;  Surgeon: [name], MD;  Location: [place];  Service: Orthopedics;  Laterality: Right;  . Stress Cardiolite  11/30/2004  . TOTAL HIP ARTHROPLASTY  10/28/2012   Procedure: TOTAL HIP ARTHROPLASTY;  Surgeon: [name], MD;  Location: [place];  Service: Orthopedics;  Laterality: Right;    Social History   Social History  . Marital status: Single    Spouse name: N/A  . Number of children: 0  . Years of education: N/A   Occupational History  . [employer]    works [employer]   Social History Main Topics  . Smoking status: Current Every Day Smoker    Packs/day: 1.00    Years: 44.00    Types: Cigarettes  . Smokeless tobacco: Never Used  . Alcohol use 0.0 oz/week     Comment: rare  . Drug use: No  . Sexual activity: Not on file     Other Topics Concern  . Not on file   Social History Narrative  . No narrative on file    Current Outpatient Prescriptions on File Prior to Visit  Medication Sig Dispense Refill  . Ascorbic Acid (VITAMIN C) 1000 MG tablet Take 2,000 mg by mouth daily.    Marland Kitchen atenolol (TENORMIN) 25 MG tablet TAKE 1 TABLET BY MOUTH EVERY DAY 90 tablet 3  . b complex vitamins tablet Take 1 tablet by mouth daily.    . canagliflozin (INVOKANA) 100 MG TABS tablet Take 1 tablet (100 mg total) by mouth daily before breakfast. 30 tablet 11  . Cholecalciferol (VITAMIN D) 2000 UNITS tablet Take 2,000 Units by mouth daily.    . clomiPHENE (CLOMID) 50 MG tablet 1 tablet daily (GENERIC PLEASE) 30 tablet 5  . Coenzyme Q10 (CO Q 10) 100 MG CAPS Take 100 mg by mouth daily.     . Cyanocobalamin (VITAMIN B-12 PO) Take 1 tablet by mouth daily.    . DHA-EPA-VIT B6-B12-FOLIC ACID PO Take 1 tablet by mouth daily.     Marland Kitchen esomeprazole (NEXIUM) 20 MG capsule Take 1 capsule (20 mg total) by mouth daily at 12 noon. [place]  capsule 11  . fluticasone (FLONASE) 50 MCG/ACT nasal spray Place 2 sprays into both nostrils daily. 16 g 6  . FOLIC ACID PO Take 1 tablet by mouth daily.    . Ginkgo Biloba (GINKOBA PO) Take 1 tablet by mouth daily.    Marland Kitchen glucose blood (ONETOUCH VERIO) test strip 1 each by Other route daily. And lancets 1/day 30 each 12  . hydrochlorothiazide (HYDRODIURIL) 25 MG tablet Take 0.5 tablets (12.5 mg total) by mouth daily. 45 tablet 3  . lisinopril (PRINIVIL,ZESTRIL) 40 MG tablet TAKE 1 TABLET BY MOUTH EVERY DAY 90 tablet 3  . Magnesium 400 MG CAPS Take 400 mg by mouth daily.     . meloxicam (MOBIC) 15 MG tablet Take 15 mg by mouth daily as needed for pain.     . metFORMIN (GLUCOPHAGE-XR) 500 MG 24 hr tablet TAKE 2 TABLETS BY MOUTH EVERY DAY WITH BREAKFAST 60 tablet 11  . Multiple Vitamin (MULTIVITAMIN) tablet Take 1 tablet by mouth daily.    . nitroGLYCERIN (NITROSTAT) 0.4 MG SL tablet Place 1 tablet (0.4 mg total) under  the tongue every 5 (five) minutes as needed for chest pain. 30 tablet 11  . rosuvastatin (CRESTOR) 5 MG tablet Take 1 tablet (5 mg total) by mouth 3 (three) times a week. 30 tablet 2  . VITAMIN A PO Take 1 tablet by mouth every other day.    . vitamin E 100 UNIT capsule Take 100 Units by mouth daily.    . Zinc 50 MG TABS Take 50 mg by mouth daily.     No current facility-administered medications on file prior to visit.     Allergies  Allergen Reactions  . Actos [Pioglitazone Hydrochloride]     Weight gain    Family History  Problem Relation Age of Onset  . Colon cancer Mother     Colon Cancer-at advanced age  . Breast cancer Sister   . Heart disease Father     BP 132/84   Pulse (!) 59   Ht 5' 7.25" (1.708 m)   Wt 206 lb (93.4 kg)   SpO2 97%   BMI 32.02 kg/m   Review of Systems Constitutional: Negative for fever.  HENT: Negative for hearing loss.   Eyes: Negative for visual disturbance.  Respiratory: Negative for shortness of breath.   Cardiovascular: Negative for chest pain.  Gastrointestinal: Negative for anal bleeding.  Endocrine: Negative for cold intolerance.  Genitourinary: Negative for hematuria and difficulty urinating.  Musculoskeletal: Negative for back pain.  Skin: Negative for rash.  Allergic/Immunologic: no environmental allergies.  Neurological: Negative for syncope and headaches.  Hematological: Does not bruise/bleed easily.   Psychiatric/Behavioral: Negative for dysphoric mood.      Objective:   Physical Exam VS: see vs page GEN: no distress HEAD: head: no deformity eyes: no periorbital swelling, no proptosis external nose and ears are normal mouth: no lesion seen NECK: supple, thyroid is not enlarged.   CHEST WALL: no deformity.   LUNGS: clear to auscultation.  BREASTS:  No gynecomastia.  CV: reg rate and rhythm, no murmur ABD: abdomen is soft, nontender.  no hepatosplenomegaly.  not distended.  no hernia RECTAL: normal external and  internal exam.  heme neg. PROSTATE:  Normal size.  No nodule MUSCULOSKELETAL: muscle bulk and strength are grossly normal.  no obvious joint swelling.  gait is normal and steady PULSES: no carotid bruit.   NEURO:  cn 2-12 grossly intact.   readily moves all 4's.  SKIN:  Normal texture and temperature.  No rash or suspicious lesion is visible.   NODES:  None palpable at the neck.  PSYCH: alert, well-oriented.  Does not appear anxious nor depressed.       Assessment & Plan:  Wellness visit today, with problems stable, except as noted. Patient is advised the following: Patient Instructions  Please consider these measures for your health:  minimize alcohol.  Do not use tobacco products.  Have a colonoscopy at least every 10 years from age 64.  Women should have an annual mammogram from age 33.  Keep firearms safely stored.  Always use seat belts.  have working smoke alarms in your home.  See an eye doctor and dentist regularly.  Never drive under the influence of alcohol or drugs (including prescription drugs).  Those with fair skin should take precautions against the sun, and should carefully examine their skin once per month, for any new or changed moles. It is critically important to prevent falling down (keep floor areas well-lit, dry, and free of loose objects.  If you have a cane, walker, or wheelchair, you should use it, even for short trips around the house.  Wear flat-soled shoes.  Also, try not to rush).  blood tests are requested for you today.  We'll let you know about the results.  Please come back for a follow-up appointment in 6 months.

## 2016-11-19 ENCOUNTER — Ambulatory Visit (INDEPENDENT_AMBULATORY_CARE_PROVIDER_SITE_OTHER): Payer: BLUE CROSS/BLUE SHIELD | Admitting: Endocrinology

## 2016-11-19 ENCOUNTER — Encounter: Payer: Self-pay | Admitting: Endocrinology

## 2016-11-19 VITALS — BP 132/84 | HR 59 | Ht 67.25 in | Wt 206.0 lb

## 2016-11-19 DIAGNOSIS — E119 Type 2 diabetes mellitus without complications: Secondary | ICD-10-CM | POA: Diagnosis not present

## 2016-11-19 DIAGNOSIS — Z23 Encounter for immunization: Secondary | ICD-10-CM

## 2016-11-19 DIAGNOSIS — D509 Iron deficiency anemia, unspecified: Secondary | ICD-10-CM

## 2016-11-19 DIAGNOSIS — F17200 Nicotine dependence, unspecified, uncomplicated: Secondary | ICD-10-CM | POA: Diagnosis not present

## 2016-11-19 DIAGNOSIS — Z0000 Encounter for general adult medical examination without abnormal findings: Secondary | ICD-10-CM | POA: Diagnosis not present

## 2016-11-19 LAB — CBC WITH DIFFERENTIAL/PLATELET
Basophils Absolute: 0.1 10*3/uL (ref 0.0–0.1)
Basophils Relative: 0.8 % (ref 0.0–3.0)
Eosinophils Absolute: 0.1 10*3/uL (ref 0.0–0.7)
Eosinophils Relative: 1.4 % (ref 0.0–5.0)
HCT: 37.6 % — ABNORMAL LOW (ref 39.0–52.0)
Hemoglobin: 12 g/dL — ABNORMAL LOW (ref 13.0–17.0)
Lymphocytes Relative: 20.7 % (ref 12.0–46.0)
Lymphs Abs: 1.7 10*3/uL (ref 0.7–4.0)
MCHC: 31.9 g/dL (ref 30.0–36.0)
MCV: 71.9 fl — ABNORMAL LOW (ref 78.0–100.0)
Monocytes Absolute: 0.7 10*3/uL (ref 0.1–1.0)
Monocytes Relative: 8.2 % (ref 3.0–12.0)
Neutro Abs: 5.7 10*3/uL (ref 1.4–7.7)
Neutrophils Relative %: 68.9 % (ref 43.0–77.0)
Platelets: 271 10*3/uL (ref 150.0–400.0)
RBC: 5.22 Mil/uL (ref 4.22–5.81)
RDW: 18.1 % — ABNORMAL HIGH (ref 11.5–15.5)
WBC: 8.3 10*3/uL (ref 4.0–10.5)

## 2016-11-19 LAB — URINALYSIS, ROUTINE W REFLEX MICROSCOPIC
Bilirubin Urine: NEGATIVE
Hgb urine dipstick: NEGATIVE
Ketones, ur: NEGATIVE
Leukocytes, UA: NEGATIVE
Nitrite: NEGATIVE
Specific Gravity, Urine: 1.02 (ref 1.000–1.030)
Total Protein, Urine: NEGATIVE
Urine Glucose: >=1000 — AB
Urobilinogen, UA: 0.2 (ref 0.0–1.0)
pH: 6 (ref 5.0–8.0)

## 2016-11-19 LAB — POCT GLYCOSYLATED HEMOGLOBIN (HGB A1C): Hemoglobin A1C: 6.3

## 2016-11-19 LAB — PSA: PSA: 0.44 ng/mL (ref 0.10–4.00)

## 2016-11-19 LAB — TSH: TSH: 2.63 u[IU]/mL (ref 0.35–4.50)

## 2016-11-19 LAB — MICROALBUMIN / CREATININE URINE RATIO
Creatinine,U: 79.7 mg/dL
Microalb Creat Ratio: 1.1 mg/g (ref 0.0–30.0)
Microalb, Ur: 0.9 mg/dL (ref 0.0–1.9)

## 2016-11-19 NOTE — Patient Instructions (Addendum)
Please consider these measures for your health:  minimize alcohol.  Do not use tobacco products.  Have a colonoscopy at least every 10 years from age 65.  Women should have an annual mammogram from age 42.  Keep firearms safely stored.  Always use seat belts.  have working smoke alarms in your home.  See an eye doctor and dentist regularly.  Never drive under the influence of alcohol or drugs (including prescription drugs).  Those with fair skin should take precautions against the sun, and should carefully examine their skin once per month, for any new or changed moles. It is critically important to prevent falling down (keep floor areas well-lit, dry, and free of loose objects.  If you have a cane, walker, or wheelchair, you should use it, even for short trips around the house.  Wear flat-soled shoes.  Also, try not to rush).  blood tests are requested for you today.  We'll let you know about the results.  Please come back for a follow-up appointment in 6 months.

## 2016-11-20 ENCOUNTER — Other Ambulatory Visit: Payer: Self-pay | Admitting: Endocrinology

## 2016-11-20 ENCOUNTER — Other Ambulatory Visit (INDEPENDENT_AMBULATORY_CARE_PROVIDER_SITE_OTHER): Payer: BLUE CROSS/BLUE SHIELD

## 2016-11-20 DIAGNOSIS — D509 Iron deficiency anemia, unspecified: Secondary | ICD-10-CM | POA: Diagnosis not present

## 2016-11-20 LAB — IBC PANEL
Iron: 26 ug/dL — ABNORMAL LOW (ref 42–165)
Saturation Ratios: 4.2 % — ABNORMAL LOW (ref 20.0–50.0)
Transferrin: 442 mg/dL — ABNORMAL HIGH (ref 212.0–360.0)

## 2016-11-23 ENCOUNTER — Encounter: Payer: Self-pay | Admitting: Endocrinology

## 2016-12-01 ENCOUNTER — Other Ambulatory Visit: Payer: Self-pay | Admitting: Endocrinology

## 2016-12-03 ENCOUNTER — Other Ambulatory Visit (INDEPENDENT_AMBULATORY_CARE_PROVIDER_SITE_OTHER): Payer: BLUE CROSS/BLUE SHIELD

## 2016-12-03 DIAGNOSIS — D509 Iron deficiency anemia, unspecified: Secondary | ICD-10-CM | POA: Diagnosis not present

## 2016-12-03 LAB — CBC WITH DIFFERENTIAL/PLATELET
Basophils Absolute: 0.1 10*3/uL (ref 0.0–0.1)
Basophils Relative: 0.6 % (ref 0.0–3.0)
Eosinophils Absolute: 0.1 10*3/uL (ref 0.0–0.7)
Eosinophils Relative: 1.5 % (ref 0.0–5.0)
HCT: 40.8 % (ref 39.0–52.0)
Hemoglobin: 13.1 g/dL (ref 13.0–17.0)
Lymphocytes Relative: 22.2 % (ref 12.0–46.0)
Lymphs Abs: 2 10*3/uL (ref 0.7–4.0)
MCHC: 32.1 g/dL (ref 30.0–36.0)
MCV: 74.3 fl — ABNORMAL LOW (ref 78.0–100.0)
Monocytes Absolute: 0.8 10*3/uL (ref 0.1–1.0)
Monocytes Relative: 9 % (ref 3.0–12.0)
Neutro Abs: 6 10*3/uL (ref 1.4–7.7)
Neutrophils Relative %: 66.7 % (ref 43.0–77.0)
Platelets: 246 10*3/uL (ref 150.0–400.0)
RBC: 5.48 Mil/uL (ref 4.22–5.81)
RDW: 20.5 % — ABNORMAL HIGH (ref 11.5–15.5)
WBC: 9 10*3/uL (ref 4.0–10.5)

## 2016-12-03 LAB — IBC PANEL
Iron: 513 ug/dL — ABNORMAL HIGH (ref 42–165)
Saturation Ratios: 90.5 % — ABNORMAL HIGH (ref 20.0–50.0)
Transferrin: 405 mg/dL — ABNORMAL HIGH (ref 212.0–360.0)

## 2016-12-03 NOTE — Telephone Encounter (Signed)
Pt is asking for more info about the stool tests mentioned in the lab results

## 2016-12-03 NOTE — Telephone Encounter (Signed)
See message and please advise, Thanks!  

## 2016-12-10 ENCOUNTER — Other Ambulatory Visit: Payer: Self-pay | Admitting: Cardiology

## 2016-12-10 DIAGNOSIS — I10 Essential (primary) hypertension: Secondary | ICD-10-CM

## 2016-12-10 DIAGNOSIS — E785 Hyperlipidemia, unspecified: Secondary | ICD-10-CM

## 2016-12-25 ENCOUNTER — Telehealth: Payer: Self-pay | Admitting: Acute Care

## 2016-12-25 DIAGNOSIS — F17210 Nicotine dependence, cigarettes, uncomplicated: Principal | ICD-10-CM

## 2016-12-25 NOTE — Telephone Encounter (Signed)
Spoke with pt and scheduled for [facility] 12/27/16 at 4:00 CT ordered Nothing further needed

## 2016-12-27 ENCOUNTER — Encounter: Payer: Self-pay | Admitting: Acute Care

## 2016-12-27 ENCOUNTER — Ambulatory Visit
Admission: RE | Admit: 2016-12-27 | Discharge: 2016-12-27 | Disposition: A | Discharge status: Home / Self Care | Payer: BLUE CROSS/BLUE SHIELD | Source: Ambulatory Visit | Attending: Acute Care | Admitting: Acute Care

## 2016-12-27 ENCOUNTER — Ambulatory Visit (INDEPENDENT_AMBULATORY_CARE_PROVIDER_SITE_OTHER): Payer: BLUE CROSS/BLUE SHIELD | Admitting: Acute Care

## 2016-12-27 DIAGNOSIS — F17210 Nicotine dependence, cigarettes, uncomplicated: Secondary | ICD-10-CM

## 2016-12-27 NOTE — Progress Notes (Signed)
Shared Decision Making Visit Lung Cancer Screening Program [phone])   Eligibility:  Age 66 y.o.  Pack Years Smoking History Calculation :48-pack-year smoking history (# packs/per year x # years smoked)  Recent History of coughing up blood  no  Unexplained weight loss? no ( >Than 15 pounds within the last 6 months )  Prior History Lung / other cancer no (Diagnosis within the last 5 years already requiring surveillance chest CT Scans).  Smoking Status Current Smoker  Former Smokers: Years since quit: NA  Quit Date: *NA  Visit Components:  Discussion included one or more decision making aids.yes  Discussion included risk/benefits of screening. yes  Discussion included potential follow up diagnostic testing for abnormal scans. yes  Discussion included meaning and risk of over diagnosis. yes  Discussion included meaning and risk of False Positives. yes  Discussion included meaning of total radiation exposure. yes  Counseling Included:  Importance of adherence to annual lung cancer LDCT screening. yes  Impact of comorbidities on ability to participate in the program. yes  Ability and willingness to under diagnostic treatment. yes  Smoking Cessation Counseling:  Current Smokers:   Discussed importance of smoking cessation. yes  Information about tobacco cessation classes and interventions provided to patient. yes  Patient provided with "ticket" for LDCT Scan. yes  Symptomatic Patient. no  CounselingNA  Diagnosis Code: Tobacco Use Z72.0  Asymptomatic Patient yes  Counseling (Intermediate counseling: > three minutes counseling) [id number]  Former Smokers:   Discussed the importance of maintaining cigarette abstinence. yes  Diagnosis Code: Personal History of Nicotine Dependence. R15.400  Information about tobacco cessation classes and interventions provided to patient. Yes  Patient provided with "ticket" for LDCT Scan. yes  Written Order for Lung Cancer  Screening with LDCT placed in Epic. Yes (CT Chest Lung Cancer Screening Low Dose W/O CM) [id number] Z12.2-Screening of respiratory organs Z87.891-Personal history of nicotine dependence   I have spent 25 minutes of face to face time with [name] [name] discussing the risks and benefits of lung cancer screening. We viewed a power point together that explained in detail the above noted topics. We paused at intervals to allow for questions to be asked and answered to ensure understanding.We discussed that the single most powerful action that he can take to decrease his risk of developing lung cancer is to quit smoking. We discussed whether or not he is ready to commit to setting a quit date. He is currently not ready to set a quit date. We discussed options for tools to aid in quitting smoking including nicotine replacement therapy, non-nicotine medications, support groups, Quit Smart classes, and behavior modification. We discussed that often times setting smaller, more achievable goals, such as eliminating 1 cigarette a day for a week and then 2 cigarettes a day for a week can be helpful in slowly decreasing the number of cigarettes smoked. This allows for a sense of accomplishment as well as providing a clinical benefit. I gave him the " Be Stronger Than Your Excuses" card with contact information for community resources, classes, free nicotine replacement therapy, and access to mobile apps, text messaging, and on-line smoking cessation help. I have also given [name] [name] my card and contact information in the event he needs to contact me. We discussed the time and location of the scan, and that either June Leap, CMA, or I will call with the results within 24-48 hours of receiving them. I have provided him with a copy of the power point we viewed  as  a resource in the event they need reinforcement of the concepts we discussed today in the office. The patient verbalized understanding of all of  the above and had no  further questions upon leaving the office. They have my contact information in the event they have any further questions.  We discussed the high incidence of findings of coronary artery disease on these scans. Explained that in this non-gated exam degree or severity could not be determined. [name] [name] is currently on statin therapy per his primary care provider. He is also followed by a cardiologist.  I spent between 4 and 5 minutes counseling on smoking cessation this visit.  Magdalen Spatz, NP 12/27/2016

## 2017-01-02 ENCOUNTER — Other Ambulatory Visit: Payer: Self-pay | Admitting: Acute Care

## 2017-01-02 DIAGNOSIS — F17210 Nicotine dependence, cigarettes, uncomplicated: Principal | ICD-10-CM

## 2017-01-03 ENCOUNTER — Encounter: Payer: Self-pay | Admitting: Cardiology

## 2017-01-03 ENCOUNTER — Ambulatory Visit (INDEPENDENT_AMBULATORY_CARE_PROVIDER_SITE_OTHER): Payer: BLUE CROSS/BLUE SHIELD | Admitting: Cardiology

## 2017-01-03 VITALS — BP 124/70 | HR 68 | Ht 67.25 in | Wt 206.0 lb

## 2017-01-03 DIAGNOSIS — I2510 Atherosclerotic heart disease of native coronary artery without angina pectoris: Secondary | ICD-10-CM | POA: Diagnosis not present

## 2017-01-03 DIAGNOSIS — I10 Essential (primary) hypertension: Secondary | ICD-10-CM

## 2017-01-03 DIAGNOSIS — E785 Hyperlipidemia, unspecified: Secondary | ICD-10-CM | POA: Diagnosis not present

## 2017-01-03 DIAGNOSIS — Z9861 Coronary angioplasty status: Secondary | ICD-10-CM

## 2017-01-03 DIAGNOSIS — R7989 Other specified abnormal findings of blood chemistry: Secondary | ICD-10-CM

## 2017-01-03 DIAGNOSIS — Z789 Other specified health status: Secondary | ICD-10-CM

## 2017-01-03 DIAGNOSIS — R945 Abnormal results of liver function studies: Secondary | ICD-10-CM

## 2017-01-03 DIAGNOSIS — I2583 Coronary atherosclerosis due to lipid rich plaque: Secondary | ICD-10-CM

## 2017-01-03 LAB — COMPREHENSIVE METABOLIC PANEL
ALT: 44 IU/L (ref 0–44)
AST: 34 IU/L (ref 0–40)
Albumin/Globulin Ratio: 2 (ref 1.2–2.2)
Albumin: 4.5 g/dL (ref 3.6–4.8)
Alkaline Phosphatase: 60 IU/L (ref 39–117)
BUN/Creatinine Ratio: 18 (ref 10–24)
BUN: 15 mg/dL (ref 8–27)
Bilirubin Total: <0.2 mg/dL (ref 0.0–1.2)
CO2: 20 mmol/L (ref 18–29)
Calcium: 10 mg/dL (ref 8.6–10.2)
Chloride: 102 mmol/L (ref 96–106)
Creatinine, Ser: 0.84 mg/dL (ref 0.76–1.27)
GFR calc Af Amer: 107 mL/min/{1.73_m2} (ref >59)
GFR calc non Af Amer: 93 mL/min/{1.73_m2} (ref >59)
Globulin, Total: 2.3 g/dL (ref 1.5–4.5)
Glucose: 156 mg/dL — ABNORMAL HIGH (ref 65–99)
Potassium: 4.4 mmol/L (ref 3.5–5.2)
Sodium: 142 mmol/L (ref 134–144)
Total Protein: 6.8 g/dL (ref 6.0–8.5)

## 2017-01-03 LAB — LIPID PANEL
Chol/HDL Ratio: 3.3 ratio units (ref 0.0–5.0)
Cholesterol, Total: 166 mg/dL (ref 100–199)
HDL: 51 mg/dL (ref >39)
LDL Calculated: 85 mg/dL (ref 0–99)
Triglycerides: 148 mg/dL (ref 0–149)
VLDL Cholesterol Cal: 30 mg/dL (ref 5–40)

## 2017-01-03 NOTE — Progress Notes (Signed)
Patient ID: [name] [name], male   DOB: 1951-10-06, 66 y.o.   MRN: [id number]     Patient Name: [name] [name] Date of Encounter: 01/03/2017  Primary Care Provider:  [name], MD Primary Cardiologist: [name]   Problem List   Past Medical History:  Diagnosis Date  . ANEMIA-IRON DEFICIENCY   . Arthritis   . BACK PAIN, LUMBAR   . Chronic kidney disease    kidney stone  . CORONARY ARTERY DISEASE   . DIVERTICULOSIS, COLON   . DM    on no medication ?prediabetes  . Elevated LFTs 11/18/2015  . GERD   . Hepatitis    States he was tested for Hep B and he had antibodies  . HYPERLIPIDEMIA   . HYPERTENSION    dr tom wall  . PONV (postoperative nausea and vomiting)    little nausea after having his hip replacement  . Unspecified disorder of urethra and urinary tract    Past Surgical History:  Procedure Laterality Date  . COLONOSCOPY    . CORONARY ANGIOPLASTY WITH STENT PLACEMENT     06    dr t. wall  . ELECTROCARDIOGRAM  03/30/2007  . ESOPHAGOGASTRODUODENOSCOPY  11/07/2005  . kidney stones    . LUMBAR LAMINECTOMY Right 05/18/2014   Procedure: LUMBAR LAMINECTOMY Microdiscectomy Right Lumbar five - sacral one;  Surgeon: [name], MD;  Location: [place];  Service: Orthopedics;  Laterality: Right;  . Stress Cardiolite  11/30/2004  . TOTAL HIP ARTHROPLASTY  10/28/2012   Procedure: TOTAL HIP ARTHROPLASTY;  Surgeon: [name], MD;  Location: [place];  Service: Orthopedics;  Laterality: Right;    Allergies  Allergies  Allergen Reactions  . Actos [Pioglitazone Hydrochloride]     Weight gain    HPI  [name] comes in for evaluation and management his coronary artery disease.He is a former patient of Dr Verl Blalock. He received coronary stenting after a positive stress test in 2006 with improved fatigue and SOB. He is very active and asymptomatic. No complains.   He denies any angina or chest discomfort. He said orthopnea, PND or edema. He denies any symptoms of TIAs or  mini strokes. He denies claudication. He continues to smoke but has cut way back he says. He seems to be compliant with his medication. He denies any presyncope or palpitations.  11/17/2015 - the patient is coming after 1 year. He stays active, working for [employer] and denies DOE or chest pain. They are not associated with diaphoresis, palpitations or dizziness. No LE edema, orthopnea or PND. He is complaint with his meds, just had lipids checked and all are at goal. Off Plavix since March 2015. A nuclear stress test in Dec 2015 was normal, normal LVEF as well.  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 He continues to smoke. He experiences occasional sharp second lasting pains in his left chest while in bed but no associated shortness of breath or dizziness.  Home Medications  Prior to Admission medications   Medication Sig Start Date End Date Taking? Authorizing Provider  Ascorbic Acid (VITAMIN C) 1000 MG tablet Take 2,000 mg by mouth daily.   Yes Historical Provider, MD  atenolol (TENORMIN) 25 MG tablet Take 1 tablet (25 mg total) by  mouth daily. 12/27/13  Yes [name], MD  atorvastatin (LIPITOR) 20 MG tablet Take 1 tablet (20 mg total) by mouth daily. 10/14/14  Yes [name], MD  Cholecalciferol (VITAMIN D) 2000 UNITS tablet Take 2,000 Units by mouth daily.   Yes Historical Provider, MD  clomiPHENE (CLOMID) 50 MG tablet Take 1/4 tablet by mouth once a day. APPOINTMENT NEEDED FOR FURTHER REFILLS. 10/10/14  Yes [name], MD  Coenzyme Q10 (CO Q 10) 100 MG CAPS Take 100 mg by mouth daily.    Yes Historical Provider, MD  DHA-EPA-VIT B6-B12-FOLIC ACID PO Take 1 tablet by mouth daily.    Yes Historical Provider, MD  lisinopril-hydrochlorothiazide (PRINZIDE,ZESTORETIC) 20-25 MG per  tablet Take 1 tablet by mouth daily. 09/27/14  Yes Dorothy Spark, MD  Magnesium 400 MG CAPS Take 400 mg by mouth daily.    Yes Historical Provider, MD  meloxicam (MOBIC) 15 MG tablet Take 15 mg by mouth daily as needed for pain.  07/22/13  Yes Historical Provider, MD  metFORMIN (GLUCOPHAGE-XR) 500 MG 24 hr tablet Take 1 tablet (500 mg total) by mouth daily with breakfast. 10/17/14  Yes [name], MD  nitroGLYCERIN (NITROSTAT) 0.4 MG SL tablet Place 0.4 mg under the tongue every 5 (five) minutes as needed for chest pain.   Yes Historical Provider, MD  Zinc 50 MG TABS Take 50 mg by mouth daily.   Yes Historical Provider, MD    Family History  Family History  Problem Relation Age of Onset  . Colon cancer Mother     Colon Cancer-at advanced age  . Breast cancer Sister   . Heart disease Father     Social History  Social History   Social History  . Marital status: Single    Spouse name: N/A  . Number of children: 0  . Years of education: N/A   Occupational History  . [employer]    works [employer]   Social History Main Topics  . Smoking status: Current Every Day Smoker    Packs/day: 1.00    Years: 48.00    Types: Cigarettes  . Smokeless tobacco: Never Used  . Alcohol use 0.0 oz/week     Comment: rare  . Drug use: No  . Sexual activity: Not on file   Other Topics Concern  . Not on file   Social History Narrative  . No narrative on file     Review of Systems, as per HPI, otherwise negative General:  No chills, fever, night sweats or weight changes.  Cardiovascular:  No chest pain, dyspnea on exertion, edema, orthopnea, palpitations, paroxysmal nocturnal dyspnea. Dermatological: No rash, lesions/masses Respiratory: No cough, dyspnea Urologic: No hematuria, dysuria Abdominal:   No nausea, vomiting, diarrhea, bright red blood per rectum, melena, or hematemesis Neurologic:  No visual changes, wkns, changes in mental status. All other systems  reviewed and are otherwise negative except as noted above.  Physical Exam  Blood pressure 124/70, pulse 68, height 5' 7.25" (1.708 m), weight 206 lb (93.4 kg).  General: Pleasant, NAD Psych: Normal affect. Neuro: Alert and oriented X 3. Moves all extremities spontaneously. HEENT: Normal  Neck: Supple without bruits or JVD. Lungs:  Resp regular and unlabored, CTA. Heart: RRR no s3, s4, or murmurs. Abdomen: Soft, non-tender, non-distended, BS + x 4.  Extremities: No clubbing, cyanosis or edema. DP/PT/Radials 2+ and equal bilaterally.  Labs:  No results for input(s): CKTOTAL, CKMB, TROPONINI in the last 72 hours. Lab Results  Component  Value Date   WBC 9.0 12/03/2016   HGB 13.1 12/03/2016   HCT 40.8 12/03/2016   MCV 74.3 (L) 12/03/2016   PLT 246.0 12/03/2016    No results found for: DDIMER Invalid input(s): POCBNP    Component Value Date/Time   NA 139 09/03/2016 1348   K 4.5 09/03/2016 1348   CL 106 09/03/2016 1348   CO2 21 09/03/2016 1348   GLUCOSE 148 (H) 09/03/2016 1348   BUN 18 09/03/2016 1348   CREATININE 0.86 09/03/2016 1348   CALCIUM 9.4 09/03/2016 1348   PROT 7.1 09/03/2016 1348   ALBUMIN 4.4 09/03/2016 1348   AST 75 (H) 09/03/2016 1348   ALT 79 (H) 09/03/2016 1348   ALKPHOS 57 09/03/2016 1348   BILITOT 0.3 09/03/2016 1348   GFRNONAA >90 05/06/2014 1536   GFRAA >90 05/06/2014 1536   Lab Results  Component Value Date   CHOL 145 09/03/2016   HDL 41 09/03/2016   LDLCALC 79 09/03/2016   TRIG 126 09/03/2016    Accessory Clinical Findings  Echocardiogram - Stress echo: 2010 Stress results: Maximal heart rate during stress was 184bpm (112% of maximal predicted heart rate). The maximal predicted heart rate was 166bpm. The target heart rate was achieved. There was a normal resting blood pressure with a normal response to stress. The rate-pressure product for the peak heart rate and blood pressure was 26449m Hg/min. Functional capacity was  normal. Stress ECG: No significant ST changes to suggest ischemia. Baseline: Peak stress: Stress echo results: No inducible wall motion abnormalities with exercise. Left ventricular ejection fraction was normal at rest and with stress. Normal echo stress   ECG - SR, RBBB, unchanged from 2014    Assessment & Plan  66year old male   1. H/o CAD, s/p PCI (? Artery) in 2006, stable asymptomatic , continue ASA, statin, BB. Normal nuclear stress test in December 2015. He has no change in symptoms. His EKG is unchanged from last year. I have reviewed his chest CT done on 12/27/2016 and it shows stent in LAD, calcification in proximal circumflex and RCA.  2. Hypertension - controlled on current regimen.  3. Hyperlipidemia - Crestor decreased to 5 mg po 3x/week. Elevated LFTs in 10/17 - recheck LFTs and lipids today.  4. Smoking cessation - counseled  5. DM - uncontrolled, HbA1c 7.2%, recently started on Metformin.  Follow up in 1 year.    KEna Dawley MD, [facility]2/23/2018, 10:09 AM

## 2017-01-03 NOTE — Patient Instructions (Addendum)
Medication Instructions:  Your physician recommends that you continue on your current medications as directed. Please refer to the Current Medication list given to you today.   Labwork: TODAY - cholesterol, complete metabolic panel   Testing/Procedures: None Ordered   Follow-Up: Your physician wants you to follow-up in: 1 year with [name].  You will receive a reminder letter in the mail two months in advance. If you don't receive a letter, please call our office to schedule the follow-up appointment.   If you need a refill on your cardiac medications before your next appointment, please call your pharmacy.   Thank you for choosing [facility]! Christen Bame, RN [phone]

## 2017-01-06 ENCOUNTER — Telehealth: Payer: Self-pay | Admitting: Cardiology

## 2017-01-06 NOTE — Telephone Encounter (Signed)
Released normal labs including lipid results per Dr [name], to pts active mychart account, and he has reviewed this.

## 2017-01-06 NOTE — Telephone Encounter (Signed)
Left a message for the pt to call back to endorse normal labs per Dr [name].

## 2017-01-06 NOTE — Telephone Encounter (Signed)
[name] [name] is returning a call about his lab results . Thanks

## 2017-03-09 ENCOUNTER — Other Ambulatory Visit: Payer: Self-pay | Admitting: Internal Medicine

## 2017-03-09 ENCOUNTER — Other Ambulatory Visit: Payer: Self-pay | Admitting: Cardiology

## 2017-03-10 IMAGING — CT CT CHEST LUNG CANCER SCREENING LOW DOSE W/O CM
1 of 2 series · 10 of 20 positions shown, 13 images · non-contrast
Comparison: Low-dose CT chest lung cancer screening dated
11/10/2015

CLINICAL DATA: 65-year-old male current smoker, with 48 pack-year
history of smoking, for follow-up lung cancer screening

EXAM:
CT CHEST WITHOUT CONTRAST LOW-DOSE FOR LUNG CANCER SCREENING
TECHNIQUE: Multidetector CT imaging of the chest was performed following the
standard protocol without IV contrast.

[ct lung segmentation data · axial · 0.71mm/px · z∈[-374,-374]mm · 10 of 324 frames shown]
[frame 1/324  mediastinal]
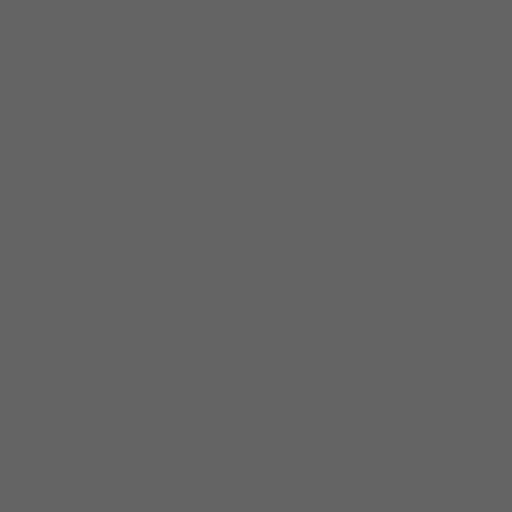
[frame 1/324  lung]
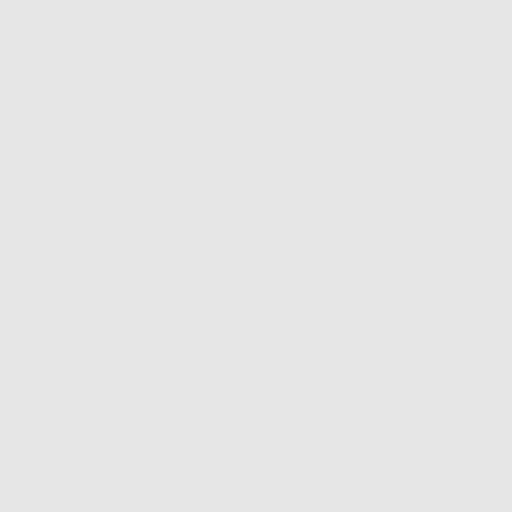
[frame 36/324  lung]
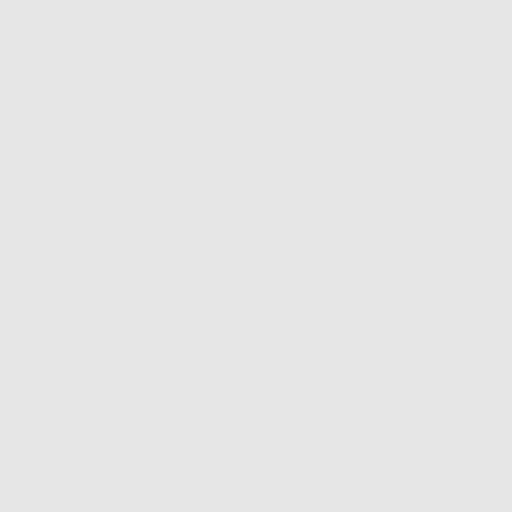
[frame 72/324  lung]
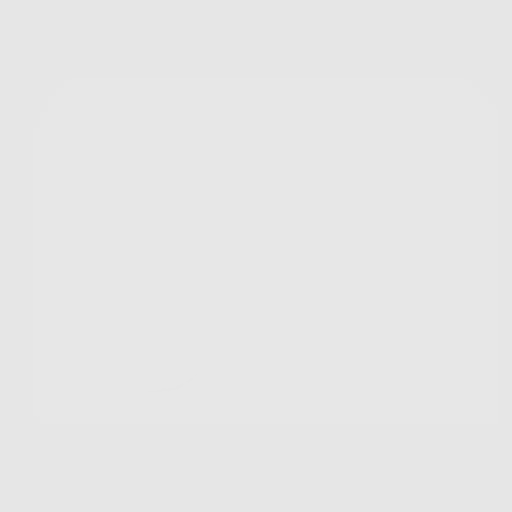
[frame 108/324  lung]
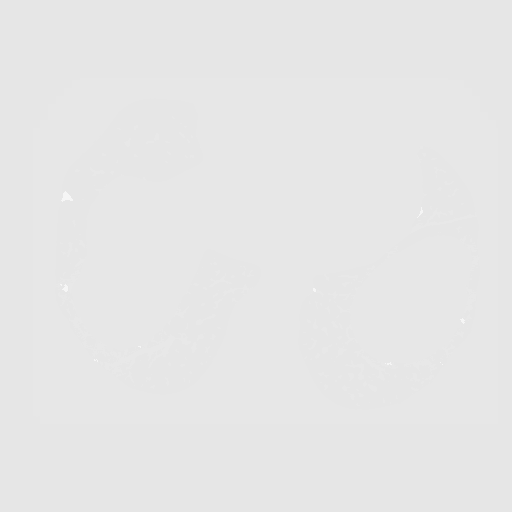
[frame 144/324  mediastinal]
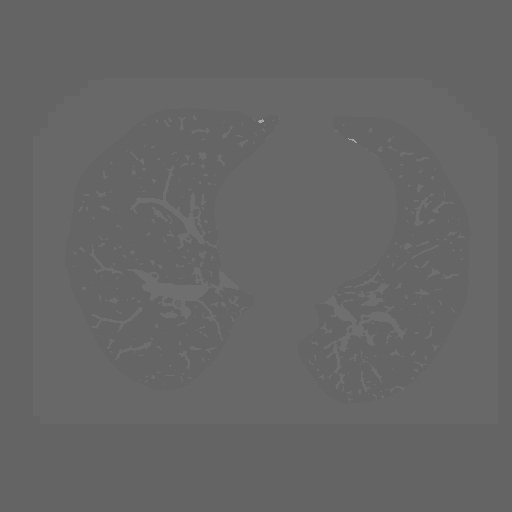
[frame 144/324  lung]
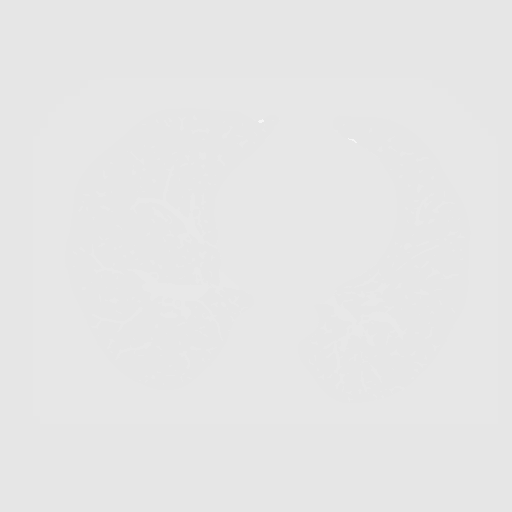
[frame 180/324  lung]
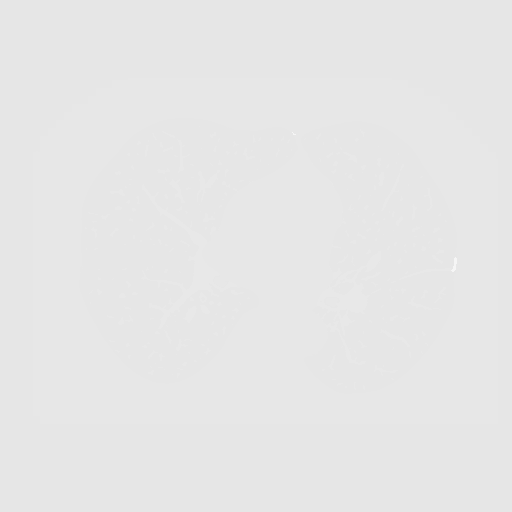
[frame 216/324  lung]
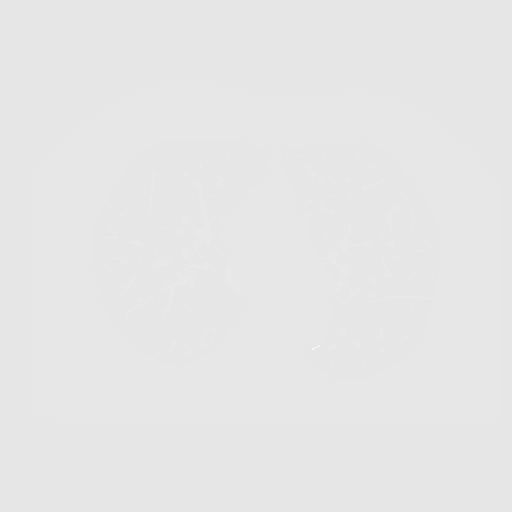
[frame 252/324  lung]
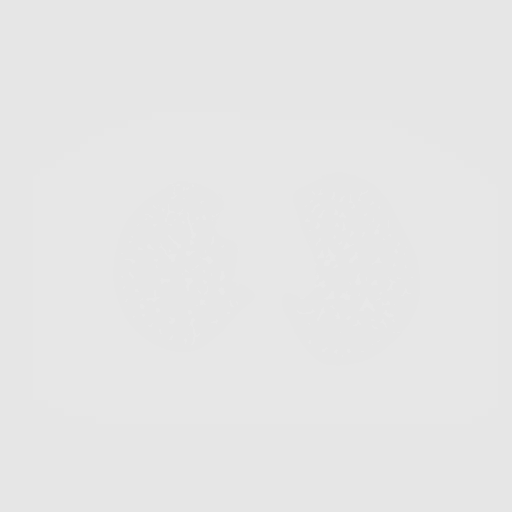
[frame 288/324  mediastinal]
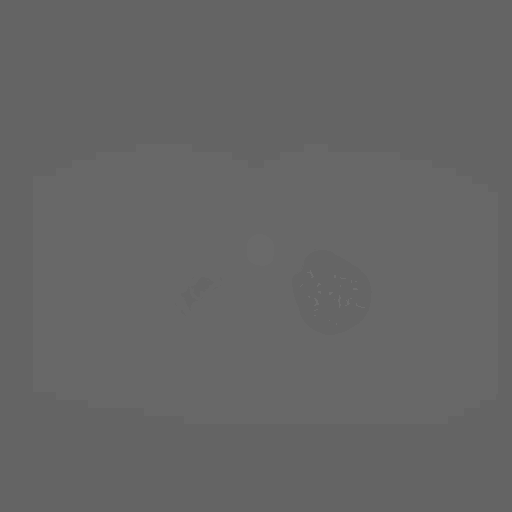
[frame 288/324  lung]
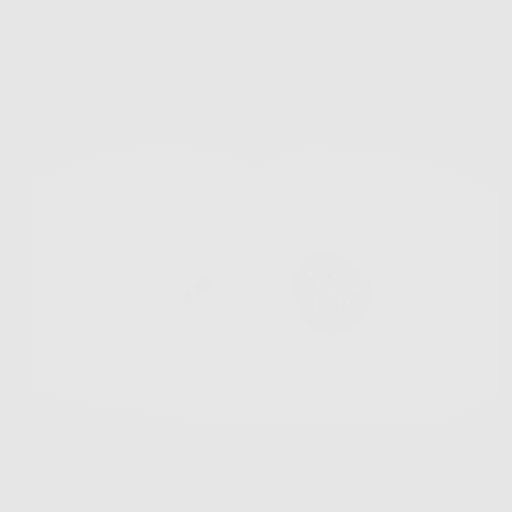
[frame 324/324  lung]
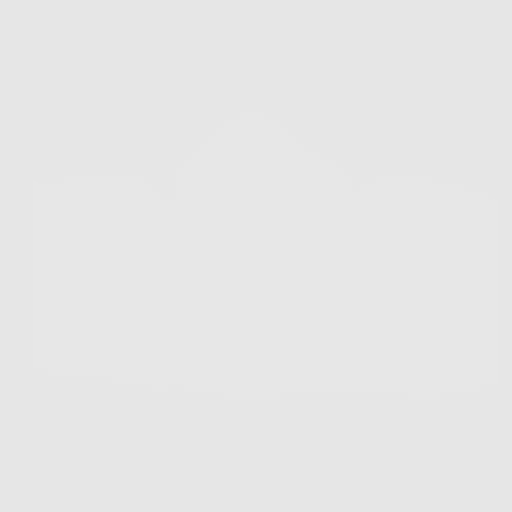

[10 of 20 positions shown; findings below may reference images not displayed]

FINDINGS: Cardiovascular: The heart is normal in size. No pericardial
effusion.

Three vessel coronary atherosclerosis.

Mild ectasia of the ascending thoracic aorta, measuring 3.9 cm. Mild
atherosclerotic calcifications aortic arch.

Mediastinum/Nodes: No suspicious mediastinal lymphadenopathy.

Visualized right thyroid is notable for a 6 mm calcified nodule
(series 2/image 8).

Lungs/Pleura: Mild centrilobular and paraseptal emphysematous
changes, upper lobe predominant. Mild subpleural
reticulation/fibrosis in the bilateral lower lobes.

2.9 mm (volumetric mean) right upper lobe pulmonary nodule (series
3/ image 82), unchanged.

No focal consolidation.

No pleural effusion or pneumothorax.

Upper Abdomen: Visualized upper abdomen is notable for moderate
hepatic steatosis.

Musculoskeletal: Mild degenerative changes of the visualized
thoracolumbar spine.
IMPRESSION: 2.9 mm right upper lobe pulmonary nodule, unchanged. Lung-RADS
Category 2, benign appearance or behavior. Continue annual screening
with low-dose chest CT without contrast in 12 months.

## 2017-04-08 ENCOUNTER — Other Ambulatory Visit: Payer: Self-pay | Admitting: Endocrinology

## 2017-04-15 ENCOUNTER — Other Ambulatory Visit: Payer: Self-pay | Admitting: Endocrinology

## 2017-04-29 ENCOUNTER — Ambulatory Visit (INDEPENDENT_AMBULATORY_CARE_PROVIDER_SITE_OTHER): Payer: BLUE CROSS/BLUE SHIELD | Admitting: Orthopaedic Surgery

## 2017-04-29 ENCOUNTER — Encounter (INDEPENDENT_AMBULATORY_CARE_PROVIDER_SITE_OTHER): Payer: Self-pay | Admitting: Orthopaedic Surgery

## 2017-04-29 ENCOUNTER — Ambulatory Visit (INDEPENDENT_AMBULATORY_CARE_PROVIDER_SITE_OTHER): Payer: BLUE CROSS/BLUE SHIELD

## 2017-04-29 VITALS — BP 129/82 | HR 64 | Ht 68.0 in | Wt 206.0 lb

## 2017-04-29 DIAGNOSIS — I2583 Coronary atherosclerosis due to lipid rich plaque: Secondary | ICD-10-CM | POA: Diagnosis not present

## 2017-04-29 DIAGNOSIS — M25562 Pain in left knee: Secondary | ICD-10-CM | POA: Diagnosis not present

## 2017-04-29 DIAGNOSIS — I2510 Atherosclerotic heart disease of native coronary artery without angina pectoris: Secondary | ICD-10-CM

## 2017-04-29 NOTE — Progress Notes (Signed)
Office Visit Note   Patient: [name] [name]           Date of Birth: Jul 12, 1951           MRN: [id number] Visit Date: 04/29/2017              Requested by: [name], MD [address] [place], [place] [address] PCP: [name], MD   Assessment & Plan: Visit Diagnoses:  1. Acute pain of left knee        Mild to moderate knee osteoarthritis  Plan: We discussed options including occasional anti-inflammatories lungs it doesn't bother her stomach, use of Tylenol intermittent ice. We discussed were correct activities will bother his knee. He has about viscus supplements as well as cortisone injection and presently is doing well enough that we don't need to consider these options. We discussed problems with hyperglycemia with the cortisone injection. We'll defer knee injection and recheck him again in 6 months.  Follow-Up Instructions: Return if symptoms worsen or fail to improve.   Orders:  Orders Placed This Encounter  Procedures  . XR Knee 1-2 Views Left   No orders of the defined types were placed in this encounter.     Procedures: No procedures performed   Clinical Data: No additional findings.   Subjective: Chief Complaint  Patient presents with  . Left Knee - Pain    HPI patient turn she still having some intermittent symptoms in his knee with occasional sharp pain when he rotates primarily at the lateral joint line left knee. He's had some intermittent back symptoms mild. Total hip arthroplasty left has been doing well for last 5 years. Occasionally has some mild knee swelling. He's lost about 5 inches off his waist with diet and exercise and states his diabetes is doing better. He denies fever chills negative history for gout.  Review of Systems 14 point review of systems positive for tobacco use, hypertension, CAD, right bundle branch block, GERD, back pain, mild to moderate knee osteoarthritis. Otherwise negative as it pertains history of  present illness.   Objective: Vital Signs: BP 129/82   Pulse 64   Ht 5' 8"  (1.727 m)   Wt 206 lb (93.4 kg)   BMI 31.32 kg/m   Physical Exam  Constitutional: He is oriented to person, place, and time. He appears well-developed and well-nourished.  HENT:  Head: Normocephalic and atraumatic.  Eyes: EOM are normal. Pupils are equal, round, and reactive to light.  Neck: No tracheal deviation present. No thyromegaly present.  Cardiovascular: Normal rate.   Pulmonary/Chest: Effort normal. He has no wheezes.  Abdominal: Soft. Bowel sounds are normal.  Musculoskeletal:  Healed left total hip arthroplasty. He is able heel and toe walk. Has minimal crepitus with knee extension. Negative popliteal compression test no trochanteric bursal tenderness. He flexes are 2+ distal pulses are palpable no venous stasis changes no pitting edema.  Neurological: He is alert and oriented to person, place, and time.  Skin: Skin is warm and dry. Capillary refill takes less than 2 seconds.  Psychiatric: He has a normal mood and affect. His behavior is normal. Judgment and thought content normal.    Ortho Exam  Specialty Comments:  No specialty comments available.  Imaging: Xr Knee 1-2 Views Left  Result Date: 04/29/2017 Standing AP both knees and lateral left knee are obtained. This shows some lateral osteophyte formation on the left knee. Mild joint space narrowing both knees medially with narrowing of approximately 20-30%  of the joint space. Lateral x-ray shows some posterior bone behind the PCL. Some irregularity of the condyles. Impression: Mild to moderate osteoarthritic changes in both knees slightly worse lateral compartment left knee.    PMFS History: Patient Active Problem List   Diagnosis Date Noted  . S/P PTCA (percutaneous transluminal coronary angioplasty) 11/18/2015  . Coronary artery disease due to lipid rich plaque 11/18/2015  . Elevated LFTs 11/18/2015  . Wellness examination  10/18/2015  . Smoker 10/18/2015  . HNP (herniated nucleus pulposus), lumbar 05/18/2014  . Hypercalcemia 09/29/2013  . Special screening for malignant neoplasms, colon 09/27/2013  . NASH (nonalcoholic steatohepatitis) 08/28/2012  . Screening for prostate cancer 08/28/2012  . Hypogonadism male 08/28/2012  . Other testicular hypofunction 07/22/2011  . Fatigue 07/05/2011  . RBBB 10/12/2010  . HEMORRHOIDS-INTERNAL 06/18/2010  . RECTAL BLEEDING 06/18/2010  . ARTHRALGIA 01/18/2010  . TOBACCO USER 07/11/2009  . POSTURAL LIGHTHEADEDNESS 07/11/2009  . FEVER UNSPECIFIED 06/30/2009  . Anemia, iron deficiency 11/18/2008  . DIVERTICULOSIS, COLON 11/18/2008  . BACK PAIN, LUMBAR 09/01/2008  . SWEATING 07/26/2008  . Diabetes ([place]) 01/20/2008  . COUGH 01/20/2008  . Dyslipidemia 06/02/2007  . Essential hypertension 06/02/2007  . CORONARY ARTERY DISEASE 06/02/2007  . GERD 06/02/2007   Past Medical History:  Diagnosis Date  . ANEMIA-IRON DEFICIENCY   . Arthritis   . BACK PAIN, LUMBAR   . Chronic kidney disease    kidney stone  . CORONARY ARTERY DISEASE   . DIVERTICULOSIS, COLON   . DM    on no medication ?prediabetes  . Elevated LFTs 11/18/2015  . GERD   . Hepatitis    States he was tested for Hep B and he had antibodies  . HYPERLIPIDEMIA   . HYPERTENSION    dr tom wall  . PONV (postoperative nausea and vomiting)    little nausea after having his hip replacement  . Unspecified disorder of urethra and urinary tract     Family History  Problem Relation Age of Onset  . Colon cancer Mother        Colon Cancer-at advanced age  . Breast cancer Sister   . Heart disease Father     Past Surgical History:  Procedure Laterality Date  . COLONOSCOPY    . CORONARY ANGIOPLASTY WITH STENT PLACEMENT     06    dr t. wall  . ELECTROCARDIOGRAM  03/30/2007  . ESOPHAGOGASTRODUODENOSCOPY  11/07/2005  . kidney stones    . LUMBAR LAMINECTOMY Right 05/18/2014   Procedure: LUMBAR LAMINECTOMY  Microdiscectomy Right Lumbar five - sacral one;  Surgeon: [name], MD;  Location: [facility];  Service: Orthopedics;  Laterality: Right;  . Stress Cardiolite  11/30/2004  . TOTAL HIP ARTHROPLASTY  10/28/2012   Procedure: TOTAL HIP ARTHROPLASTY;  Surgeon: [name], MD;  Location: [facility];  Service: Orthopedics;  Laterality: Right;   Social History   Occupational History  . [employer]    works [employer]   Social History Main Topics  . Smoking status: Current Every Day Smoker    Packs/day: 1.00    Years: 48.00    Types: Cigarettes  . Smokeless tobacco: Never Used  . Alcohol use 0.0 oz/week     Comment: rare  . Drug use: No  . Sexual activity: Not on file

## 2017-05-20 ENCOUNTER — Ambulatory Visit (INDEPENDENT_AMBULATORY_CARE_PROVIDER_SITE_OTHER): Payer: BLUE CROSS/BLUE SHIELD | Admitting: Endocrinology

## 2017-05-20 ENCOUNTER — Encounter: Payer: Self-pay | Admitting: Endocrinology

## 2017-05-20 VITALS — BP 132/84 | HR 55 | Ht 68.0 in | Wt 204.0 lb

## 2017-05-20 DIAGNOSIS — Z0000 Encounter for general adult medical examination without abnormal findings: Secondary | ICD-10-CM

## 2017-05-20 DIAGNOSIS — E119 Type 2 diabetes mellitus without complications: Secondary | ICD-10-CM | POA: Diagnosis not present

## 2017-05-20 DIAGNOSIS — Z23 Encounter for immunization: Secondary | ICD-10-CM

## 2017-05-20 DIAGNOSIS — I2510 Atherosclerotic heart disease of native coronary artery without angina pectoris: Secondary | ICD-10-CM

## 2017-05-20 DIAGNOSIS — I2583 Coronary atherosclerosis due to lipid rich plaque: Secondary | ICD-10-CM

## 2017-05-20 LAB — POCT GLYCOSYLATED HEMOGLOBIN (HGB A1C): Hemoglobin A1C: 6.2

## 2017-05-20 NOTE — Progress Notes (Signed)
Subjective:    Patient ID: [name] [name], male    DOB: 12-31-1950, 66 y.o.   MRN: [id number]  HPI Pt returns for f/u of DM. DM type: 2 Dx'ed: 2035 Complications: CAD Therapy: 2 oral meds DKA: never Severe hypoglycemia: never.  Pancreatitis: never Other: he has never been on insulin; pioglitizone was favored due to NASH, but he did not tolerate (weight gain).   Interval history: he says cbg's are well-controlled.  pt states he feels well in general. Past Medical History:  Diagnosis Date  . ANEMIA-IRON DEFICIENCY   . Arthritis   . BACK PAIN, LUMBAR   . Chronic kidney disease    kidney stone  . CORONARY ARTERY DISEASE   . DIVERTICULOSIS, COLON   . DM    on no medication ?prediabetes  . Elevated LFTs 11/18/2015  . GERD   . Hepatitis    States he was tested for Hep B and he had antibodies  . HYPERLIPIDEMIA   . HYPERTENSION    dr tom wall  . PONV (postoperative nausea and vomiting)    little nausea after having his hip replacement  . Unspecified disorder of urethra and urinary tract     Past Surgical History:  Procedure Laterality Date  . COLONOSCOPY    . CORONARY ANGIOPLASTY WITH STENT PLACEMENT     06    dr t. wall  . ELECTROCARDIOGRAM  03/30/2007  . ESOPHAGOGASTRODUODENOSCOPY  11/07/2005  . kidney stones    . LUMBAR LAMINECTOMY Right 05/18/2014   Procedure: LUMBAR LAMINECTOMY Microdiscectomy Right Lumbar five - sacral one;  Surgeon: [name], MD;  Location: [facility];  Service: Orthopedics;  Laterality: Right;  . Stress Cardiolite  11/30/2004  . TOTAL HIP ARTHROPLASTY  10/28/2012   Procedure: TOTAL HIP ARTHROPLASTY;  Surgeon: [name], MD;  Location: [facility];  Service: Orthopedics;  Laterality: Right;    Social History   Social History  . Marital status: Single    Spouse name: N/A  . Number of children: 0  . Years of education: N/A   Occupational History  . [employer]    works [employer]   Social History Main Topics  . Smoking  status: Current Every Day Smoker    Packs/day: 1.00    Years: 48.00    Types: Cigarettes  . Smokeless tobacco: Never Used  . Alcohol use 0.0 oz/week     Comment: rare  . Drug use: No  . Sexual activity: Not on file   Other Topics Concern  . Not on file   Social History Narrative  . No narrative on file    Current Outpatient Prescriptions on File Prior to Visit  Medication Sig Dispense Refill  . Ascorbic Acid (VITAMIN C) 1000 MG tablet Take 2,000 mg by mouth daily.    Marland Kitchen atenolol (TENORMIN) 25 MG tablet TAKE 1 TABLET BY MOUTH EVERY DAY 90 tablet 2  . b complex vitamins tablet Take 1 tablet by mouth daily.    . canagliflozin (INVOKANA) 100 MG TABS tablet Take 1 tablet (100 mg total) by mouth daily before breakfast. 30 tablet 11  . Cholecalciferol (VITAMIN D) 2000 UNITS tablet Take 2,000 Units by mouth daily.    . clomiPHENE (CLOMID) 50 MG tablet 1 tablet daily (GENERIC PLEASE) 30 tablet 5  . Coenzyme Q10 (CO Q 10) 100 MG CAPS Take 100 mg by mouth daily.     . Cyanocobalamin (VITAMIN B-12 PO) Take 1 tablet by mouth daily.    Marland Kitchen  DHA-EPA-VIT B6-B12-FOLIC ACID PO Take 1 tablet by mouth daily.     Marland Kitchen esomeprazole (NEXIUM) 20 MG capsule Take 1 capsule (20 mg total) by mouth daily before breakfast. 30 capsule 5  . fluticasone (FLONASE) 50 MCG/ACT nasal spray Place 2 sprays into both nostrils daily. 16 g 6  . FOLIC ACID PO Take 1 tablet by mouth daily.    . Ginkgo Biloba (GINKOBA PO) Take 1 tablet by mouth daily.    . hydrochlorothiazide (HYDRODIURIL) 25 MG tablet Take 0.5 tablets (12.5 mg total) by mouth daily. 45 tablet 3  . lisinopril (PRINIVIL,ZESTRIL) 40 MG tablet TAKE 1 TABLET BY MOUTH EVERY DAY 90 tablet 3  . Magnesium 400 MG CAPS Take 400 mg by mouth daily.     . meloxicam (MOBIC) 15 MG tablet Take 15 mg by mouth daily as needed for pain.     . metFORMIN (GLUCOPHAGE-XR) 500 MG 24 hr tablet TAKE 2 TABLETS BY MOUTH EVERY DAY WITH BREAKFAST 60 tablet 11  . Multiple Vitamin (MULTIVITAMIN)  tablet Take 1 tablet by mouth daily.    . nitroGLYCERIN (NITROSTAT) 0.4 MG SL tablet Place 1 tablet (0.4 mg total) under the tongue every 5 (five) minutes as needed for chest pain. 30 tablet 11  . ONETOUCH VERIO test strip TEST EVERY DAY 25 each 4  . rosuvastatin (CRESTOR) 5 MG tablet TAKE 1 TABLET (5 MG TOTAL) BY MOUTH 3 (THREE) TIMES A WEEK. 36 tablet 3  . VITAMIN A PO Take 1 tablet by mouth every other day.    . vitamin E 100 UNIT capsule Take 100 Units by mouth daily.    . Zinc 50 MG TABS Take 50 mg by mouth daily.     No current facility-administered medications on file prior to visit.     Allergies  Allergen Reactions  . Actos [Pioglitazone Hydrochloride]     Weight gain    Family History  Problem Relation Age of Onset  . Colon cancer Mother        Colon Cancer-at advanced age  . Breast cancer Sister   . Heart disease Father     BP 132/84   Pulse (!) 55   Ht 5' 8"  (1.727 m)   Wt 204 lb (92.5 kg)   SpO2 97%   BMI 31.02 kg/m    Review of Systems He has lost a few lbs, due to his efforts    Objective:   Physical Exam VITAL SIGNS:  See vs page GENERAL: no distress Pulses: dorsalis pedis intact bilat.   MSK: no deformity of the feet CV: no leg edema Skin:  no ulcer on the feet.  normal color and temp on the feet. Neuro: sensation is intact to touch on the feet.     Lab Results  Component Value Date   HGBA1C 6.2 05/20/2017      Assessment & Plan:  Type 2 DM, with CAD: well-controlled.  Please continue the same medication HTN: well-controlled.  Please continue the same medication   . Subjective:   Patient here for Medicare annual wellness visit and management of other chronic and acute problems.       Risk factors: advanced age    82 of Physicians Providing Medical Care to Patient:  See "snapshot"   Activities of Daily Living: In your present state of health, do you have any difficulty performing the following activities (lives alone)?:    Preparing food and eating?: No  Bathing yourself: No  Getting dressed: No  Using  the toilet:No  Moving around from place to place: No  In the past year have you fallen or had a near fall?: No    Home Safety: Has smoke detector and wears seat belts. No firearms. No excess sun exposure.  Diet and Exercise  Current exercise habits: pt says good Dietary issues discussed: pt reports a healthy diet   Depression Screen  Q1: Over the past two weeks, have you felt down, depressed or hopeless? no  Q2: Over the past two weeks, have you felt little interest or pleasure in doing things? no   The following portions of the patient's history were reviewed and updated as appropriate: allergies, current medications, past family history, past medical history, past social history, past surgical history and problem list.   Review of Systems  Denies hearing loss, and visual loss Objective:   Vision:  Advertising account executive, but does not recall name.  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  Cognitive Impairment Assessment: cognition, memory and judgment appear normal.  remembers 2/3 at 5 minutes (? effort).  excellent recall.  can easily read and write a sentence.  alert and oriented x 3   Assessment:   Medicare wellness utd on preventive parameters    Plan:   During the course of the visit the patient was educated and counseled about appropriate screening and preventive services including:        Fall prevention   Diabetes care is done today Nutrition counseling is offered  Vaccines are updated as needed  Patient Instructions (the written plan) was given to the patient.

## 2017-05-20 NOTE — Progress Notes (Signed)
we discussed code status.  pt requests full code, but would not want to be started or maintained on artificial life-support measures if there was not a reasonable chance of recovery 

## 2017-05-20 NOTE — Patient Instructions (Addendum)
Please consider these measures for your health:  minimize alcohol.  Do not use tobacco products.  Have a colonoscopy at least every 10 years from age 65.  Keep firearms safely stored.  Always use seat belts.  have working smoke alarms in your home.  See an eye doctor and dentist regularly.  Never drive under the influence of alcohol or drugs (including prescription drugs).  Those with fair skin should take precautions against the sun, and should carefully examine their skin once per month, for any new or changed moles. It is critically important to prevent falling down (keep floor areas well-lit, dry, and free of loose objects.  If you have a cane, walker, or wheelchair, you should use it, even for short trips around the house.  Wear flat-soled shoes.  Also, try not to rush) good diet and exercise significantly improve the control of your diabetes.  please let me know if you wish to be referred to a dietician.  high blood sugar is very risky to your health.  you should see an eye doctor and dentist every year.  It is very important to get all recommended vaccinations.  Please come back for a regular physical appointment in 6 months (must be after 11/20/17)

## 2017-05-28 DIAGNOSIS — Z1212 Encounter for screening for malignant neoplasm of rectum: Secondary | ICD-10-CM | POA: Diagnosis not present

## 2017-05-28 DIAGNOSIS — Z1211 Encounter for screening for malignant neoplasm of colon: Secondary | ICD-10-CM | POA: Diagnosis not present

## 2017-05-28 LAB — COLOGUARD

## 2017-06-01 ENCOUNTER — Other Ambulatory Visit: Payer: Self-pay | Admitting: Endocrinology

## 2017-07-25 ENCOUNTER — Encounter: Payer: Self-pay | Admitting: Endocrinology

## 2017-08-23 ENCOUNTER — Other Ambulatory Visit: Payer: Self-pay | Admitting: Endocrinology

## 2017-10-11 ENCOUNTER — Other Ambulatory Visit: Payer: Self-pay | Admitting: Endocrinology

## 2017-10-29 ENCOUNTER — Telehealth: Payer: Self-pay | Admitting: Internal Medicine

## 2017-10-29 NOTE — Telephone Encounter (Signed)
Please advise which one you prefer he try Sir, thank you.

## 2017-10-30 ENCOUNTER — Other Ambulatory Visit: Payer: Self-pay

## 2017-10-30 MED ORDER — PANTOPRAZOLE SODIUM 40 MG PO TBEC: 40.0000 mg | DELAYED_RELEASE_TABLET | Freq: Every day | ORAL | 5 refills | Status: DC

## 2017-10-30 NOTE — Telephone Encounter (Signed)
Spoke with Saralyn Pilar and told him about the pantoprazole Dr [name] recommends for him.  He said to send in a month at a time not 90 days worth. Confirmed pharmacy and sent in rx.

## 2017-10-30 NOTE — Telephone Encounter (Signed)
Pantoprazole 40 mg

## 2017-11-02 ENCOUNTER — Other Ambulatory Visit: Payer: Self-pay | Admitting: Cardiology

## 2017-11-02 DIAGNOSIS — E785 Hyperlipidemia, unspecified: Secondary | ICD-10-CM

## 2017-11-02 DIAGNOSIS — I10 Essential (primary) hypertension: Secondary | ICD-10-CM

## 2017-11-21 ENCOUNTER — Ambulatory Visit (INDEPENDENT_AMBULATORY_CARE_PROVIDER_SITE_OTHER): Payer: 59 | Admitting: Endocrinology

## 2017-11-21 ENCOUNTER — Encounter: Payer: Self-pay | Admitting: Endocrinology

## 2017-11-21 VITALS — BP 148/74 | HR 63 | Wt 210.0 lb

## 2017-11-21 DIAGNOSIS — R7989 Other specified abnormal findings of blood chemistry: Secondary | ICD-10-CM | POA: Diagnosis not present

## 2017-11-21 DIAGNOSIS — Z125 Encounter for screening for malignant neoplasm of prostate: Secondary | ICD-10-CM

## 2017-11-21 DIAGNOSIS — E119 Type 2 diabetes mellitus without complications: Secondary | ICD-10-CM | POA: Diagnosis not present

## 2017-11-21 DIAGNOSIS — Z0000 Encounter for general adult medical examination without abnormal findings: Secondary | ICD-10-CM

## 2017-11-21 DIAGNOSIS — F17200 Nicotine dependence, unspecified, uncomplicated: Secondary | ICD-10-CM | POA: Diagnosis not present

## 2017-11-21 DIAGNOSIS — Z23 Encounter for immunization: Secondary | ICD-10-CM | POA: Diagnosis not present

## 2017-11-21 LAB — POCT GLYCOSYLATED HEMOGLOBIN (HGB A1C): Hemoglobin A1C: 6.3

## 2017-11-21 LAB — PSA, MEDICARE: PSA: 0.56 ng/ml (ref 0.10–4.00)

## 2017-11-21 MED ORDER — BUPROPION HCL ER (XL) 150 MG PO TB24: 150.0000 mg | ORAL_TABLET | Freq: Every day | ORAL | 11 refills | Status: DC

## 2017-11-21 NOTE — Patient Instructions (Addendum)
Please consider these measures for your health:  minimize alcohol.  Do not use tobacco products.  Have a colonoscopy at least every 10 years from age 66.   Keep firearms safely stored.  Always use seat belts.  have working smoke alarms in your home.  See an eye doctor and dentist regularly.  Never drive under the influence of alcohol or drugs (including prescription drugs).  Those with fair skin should take precautions against the sun, and should carefully examine their skin once per month, for any new or changed moles.  I have sent a prescription to your pharmacy, for wellbutrin, to help you quit smoking.  We can double this if you feel we need to.   Please come back for a follow-up appointment in 6 months (must be after 05/20/18).

## 2017-11-21 NOTE — Progress Notes (Signed)
Subjective:    Patient ID: [name] [name], male    DOB: July 12, 1951, 67 y.o.   MRN: [id number]  HPI Pt is here for regular wellness examination, and is feeling pretty well in general, and says chronic med probs are stable, except as noted below He declines chantix.  Past Medical History:  Diagnosis Date  . ANEMIA-IRON DEFICIENCY   . Arthritis   . BACK PAIN, LUMBAR   . Chronic kidney disease    kidney stone  . CORONARY ARTERY DISEASE   . DIVERTICULOSIS, COLON   . DM    on no medication ?prediabetes  . Elevated LFTs 11/18/2015  . GERD   . Hepatitis    States he was tested for Hep B and he had antibodies  . HYPERLIPIDEMIA   . HYPERTENSION    dr tom wall  . PONV (postoperative nausea and vomiting)    little nausea after having his hip replacement  . Unspecified disorder of urethra and urinary tract     Past Surgical History:  Procedure Laterality Date  . COLONOSCOPY    . CORONARY ANGIOPLASTY WITH STENT PLACEMENT     06    dr t. wall  . ELECTROCARDIOGRAM  03/30/2007  . ESOPHAGOGASTRODUODENOSCOPY  11/07/2005  . kidney stones    . LUMBAR LAMINECTOMY Right 05/18/2014   Procedure: LUMBAR LAMINECTOMY Microdiscectomy Right Lumbar five - sacral one;  Surgeon: [name], MD;  Location: [facility];  Service: Orthopedics;  Laterality: Right;  . Stress Cardiolite  11/30/2004  . TOTAL HIP ARTHROPLASTY  10/28/2012   Procedure: TOTAL HIP ARTHROPLASTY;  Surgeon: [name], MD;  Location: [facility];  Service: Orthopedics;  Laterality: Right;    Social History   Socioeconomic History  . Marital status: Single    Spouse name: Not on file  . Number of children: 0  . Years of education: Not on file  . Highest education level: Not on file  Social Needs  . Financial resource strain: Not on file  . Food insecurity - worry: Not on file  . Food insecurity - inability: Not on file  . Transportation needs - medical: Not on file  . Transportation needs - non-medical: Not on file  Occupational  History  . 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 Smoking status: Current Every Day Smoker    Packs/day: 1.00    Years: 48.00    Pack years: 48.00    Types: Cigarettes  . Smokeless tobacco: Never Used  Substance and Sexual Activity  . Alcohol use: Yes    Alcohol/week: 0.0 oz    Comment: rare  . Drug use: No  . Sexual activity: Not on file  Other Topics Concern  . Not on file  Social History Narrative  . Not on file    Current Outpatient Medications on File Prior to Visit  Medication Sig Dispense Refill  . Ascorbic Acid (VITAMIN C) 1000 MG tablet Take 2,000 mg by mouth daily.    Marland Kitchen atenolol (TENORMIN) 25 MG tablet TAKE 1 TABLET BY MOUTH EVERY DAY 90 tablet 2  . b complex vitamins tablet Take 1 tablet by mouth daily.    . canagliflozin (INVOKANA) 100 MG TABS tablet Take 1 tablet (100 mg total) by mouth daily before breakfast. 30 tablet 11  . Cholecalciferol (VITAMIN D) 2000 UNITS tablet Take 2,000 Units by mouth daily.    . clomiPHENE (CLOMID) 50 MG tablet  TAKE 1/4 TABLET BY MOUTH DAILY 30 tablet 4  . Coenzyme Q10 (CO Q 10) 100 MG CAPS Take 100 mg by mouth daily.     . Cyanocobalamin (VITAMIN B-12 PO) Take 1 tablet by mouth daily.    . DHA-EPA-VIT B6-B12-FOLIC ACID PO Take 1 tablet by mouth daily.     . fluticasone (FLONASE) 50 MCG/ACT nasal spray Place 2 sprays into both nostrils daily. 16 g 6  . FOLIC ACID PO Take 1 tablet by mouth daily.    . Ginkgo Biloba (GINKOBA PO) Take 1 tablet by mouth daily.    Marland Kitchen lisinopril (PRINIVIL,ZESTRIL) 40 MG tablet TAKE 1 TABLET BY MOUTH EVERY DAY 90 tablet 0  . Magnesium 400 MG CAPS Take 400 mg by mouth daily.     . meloxicam (MOBIC) 15 MG tablet Take 15 mg by mouth daily as needed for pain.     . metFORMIN (GLUCOPHAGE-XR) 500 MG 24 hr tablet TAKE 2 TABLETS BY MOUTH EVERY DAY WITH BREAKFAST 60 tablet 11  . Multiple Vitamin (MULTIVITAMIN) tablet Take 1 tablet by mouth daily.    .  nitroGLYCERIN (NITROSTAT) 0.4 MG SL tablet Place 1 tablet (0.4 mg total) under the tongue every 5 (five) minutes as needed for chest pain. 30 tablet 11  . ONETOUCH VERIO test strip TEST EVERY DAY 25 each 4  . pantoprazole (PROTONIX) 40 MG tablet Take 1 tablet (40 mg total) by mouth daily. 30 tablet 5  . VITAMIN A PO Take 1 tablet by mouth every other day.    . vitamin E 100 UNIT capsule Take 100 Units by mouth daily.    . Zinc 50 MG TABS Take 50 mg by mouth daily.    . hydrochlorothiazide (HYDRODIURIL) 25 MG tablet Take 0.5 tablets (12.5 mg total) by mouth daily. (Patient not taking: Reported on 11/21/2017) 45 tablet 3  . rosuvastatin (CRESTOR) 5 MG tablet TAKE 1 TABLET (5 MG TOTAL) BY MOUTH 3 (THREE) TIMES A WEEK. 36 tablet 3   No current facility-administered medications on file prior to visit.     Allergies  Allergen Reactions  . Actos [Pioglitazone Hydrochloride]     Weight gain    Family History  Problem Relation Age of Onset  . Colon cancer Mother        Colon Cancer-at advanced age  . Breast cancer Sister   . Heart disease Father     BP (!) 148/74 (BP Location: Left Arm, Patient Position: Sitting, Cuff Size: Normal)   Pulse 63   Wt 210 lb (95.3 kg)   SpO2 97%   BMI 31.93 kg/m   Review of Systems Denies fever, fatigue, visual loss, hearing loss, chest pain, sob, depression, cold intolerance, BRBPR, hematuria, syncope, numbness, allergy sxs, easy bruising, and rash. No change in chronic low-back pain.      Objective:   Physical Exam VS: see vs page GEN: no distress HEAD: head: no deformity eyes: no periorbital swelling, no proptosis external nose and ears are normal mouth: no lesion seen NECK: supple, thyroid is not enlarged CHEST WALL: no deformity LUNGS: clear to auscultation BREASTS:  No gynecomastia CV: reg rate and rhythm, no murmur ABD: abdomen is soft, nontender.  no hepatosplenomegaly.  not distended.  no hernia GENITALIA/RECTAL/PROSTATE: declined.     MUSCULOSKELETAL: muscle bulk and strength are grossly normal.  no obvious joint swelling.  gait is normal and steady EXTEMITIES: no deformity.  no ulcer on the feet.  feet are of normal color and temp.  no edema PULSES: dorsalis pedis intact bilat.  no carotid bruit NEURO:  cn 2-12 grossly intact.   readily moves all 4's.  sensation is intact to touch on the feet SKIN:  Normal texture and temperature.  No rash or suspicious lesion is visible.   NODES:  None palpable at the neck PSYCH: alert, well-oriented.  Does not appear anxious nor depressed.   Lab Results  Component Value Date   HGBA1C 6.3 11/21/2017       Assessment & Plan:  Wellness visit today, with problems stable, except as noted. Type 2 DM: well-controlled. HTN: recheck next time.  Patient Instructions  Please consider these measures for your health:  minimize alcohol.  Do not use tobacco products.  Have a colonoscopy at least every 10 years from age 68.   Keep firearms safely stored.  Always use seat belts.  have working smoke alarms in your home.  See an eye doctor and dentist regularly.  Never drive under the influence of alcohol or drugs (including prescription drugs).  Those with fair skin should take precautions against the sun, and should carefully examine their skin once per month, for any new or changed moles.  I have sent a prescription to your pharmacy, for wellbutrin, to help you quit smoking.  We can double this if you feel we need to.   Please come back for a follow-up appointment in 6 months (must be after 05/20/18).

## 2017-11-23 LAB — TESTOSTERONE,FREE AND TOTAL
Testosterone, Free: 4.5 pg/mL — ABNORMAL LOW (ref 6.6–18.1)
Testosterone: 436 ng/dL (ref 264–916)

## 2017-11-28 ENCOUNTER — Encounter (INDEPENDENT_AMBULATORY_CARE_PROVIDER_SITE_OTHER): Payer: Self-pay | Admitting: Orthopaedic Surgery

## 2017-11-28 ENCOUNTER — Ambulatory Visit (INDEPENDENT_AMBULATORY_CARE_PROVIDER_SITE_OTHER): Payer: Managed Care, Other (non HMO) | Admitting: Orthopaedic Surgery

## 2017-11-28 VITALS — BP 131/78 | HR 62 | Temp 97.8°F | Ht 68.0 in | Wt 210.0 lb

## 2017-11-28 DIAGNOSIS — M545 Low back pain, unspecified: Secondary | ICD-10-CM

## 2017-11-28 NOTE — Progress Notes (Signed)
Office Visit Note   Patient: [name] [name]           Date of Birth: [birth year]           MRN: [id number] Visit Date: 11/28/2017              Requested by: [name], MD [address] [place], [place] [address] PCP: [name], MD   Assessment & Plan: Visit Diagnoses:  1. Low back pain without sciatica, unspecified back pain laterality, unspecified chronicity          Post right total hip arthroplasty 2013 doing well.  2.  Post L5-S1 right microdiscectomy    Plan: He will continue with health maintenance with gradual weight loss to help with his diabetes core strengthening.  Currently his back is doing well really bothers him.  He can return if he has recurrent symptoms.  He is happy with the surgical result from his 2015 surgery.  Follow-Up Instructions: No Follow-up on file.   Orders:  No orders of the defined types were placed in this encounter.  No orders of the defined types were placed in this encounter.     Procedures: No procedures performed   Clinical Data: No additional findings.   Subjective: No chief complaint on file.   HPI patient returns for follow-up he had total hip arthroplasty 5 years ago by Dr. Percell Miller.  He has had some problems with both knees little bit more on the left than right with some mild osteoarthritic changes.  No locking in his knees.  Sometimes he has some discomfort that radiates down his cast.  At his ankle.  He has had some intermittent back spasms none recently.  Her previous MRI showed some disc degeneration at L2-3 L3-4 and L4-5 with disc protrusions.  He denies bowel bladder symptoms.  He is now on Invokana as well as metformin and is lost some weight about 10 pounds so far and is continuing to walk and try to continue weight loss to help with his diabetes.  His A1c he states is 6.4.  Review of Systems 14 point review of systems updated unchanged from 04/29/2017 office visit other than as mentioned in  HPI.   Objective: Vital Signs: BP 131/78 (BP Location: Left Arm, Patient Position: Sitting, Cuff Size: Normal)   Pulse 62   Temp 97.8 F (36.6 C) (Oral)   Ht 5' 8"  (1.727 m)   Wt 210 lb (95.3 kg)   SpO2 96%   BMI 31.93 kg/m   Physical Exam  Constitutional: He is oriented to person, place, and time. He appears well-developed and well-nourished.  HENT:  Head: Normocephalic and atraumatic.  Eyes: EOM are normal. Pupils are equal, round, and reactive to light.  Neck: No tracheal deviation present. No thyromegaly present.  Cardiovascular: Normal rate.  Pulmonary/Chest: Effort normal. He has no wheezes.  Abdominal: Soft. Bowel sounds are normal.  Neurological: He is alert and oriented to person, place, and time.  Skin: Skin is warm and dry. Capillary refill takes less than 2 seconds.  Psychiatric: He has a normal mood and affect. His behavior is normal. Judgment and thought content normal.    Ortho Exam no plantar foot lesions negative straight leg raising 90 degrees.  Minimal joint line tenderness collateral ligaments are stable no varus or valgus deformity of his knees.  No rash over exposed skin.  He is amatory without limping.  Mild tenderness over the right greater trochanter and right sciatic notch.  Specialty Comments:  No specialty comments available.  Imaging: No results found.   PMFS History: Patient Active Problem List   Diagnosis Date Noted  . Low testosterone 11/21/2017  . S/P PTCA (percutaneous transluminal coronary angioplasty) 11/18/2015  . Coronary artery disease due to lipid rich plaque 11/18/2015  . Elevated LFTs 11/18/2015  . Wellness examination 10/18/2015  . Smoker 10/18/2015  . HNP (herniated nucleus pulposus), lumbar 05/18/2014  . Hypercalcemia 09/29/2013  . Special screening for malignant neoplasms, colon 09/27/2013  . NASH (nonalcoholic steatohepatitis) 08/28/2012  . Screening for prostate cancer 08/28/2012  . Hypogonadism male 08/28/2012  .  Other testicular hypofunction 07/22/2011  . Fatigue 07/05/2011  . RBBB 10/12/2010  . HEMORRHOIDS-INTERNAL 06/18/2010  . RECTAL BLEEDING 06/18/2010  . ARTHRALGIA 01/18/2010  . TOBACCO USER 07/11/2009  . POSTURAL LIGHTHEADEDNESS 07/11/2009  . Anemia, iron deficiency 11/18/2008  . DIVERTICULOSIS, COLON 11/18/2008  . BACK PAIN, LUMBAR 09/01/2008  . SWEATING 07/26/2008  . Diabetes ([facility]) 01/20/2008  . COUGH 01/20/2008  . Dyslipidemia 06/02/2007  . Essential hypertension 06/02/2007  . CORONARY ARTERY DISEASE 06/02/2007  . GERD 06/02/2007   Past Medical History:  Diagnosis Date  . ANEMIA-IRON DEFICIENCY   . Arthritis   . BACK PAIN, LUMBAR   . Chronic kidney disease    kidney stone  . CORONARY ARTERY DISEASE   . DIVERTICULOSIS, COLON   . DM    on no medication ?prediabetes  . Elevated LFTs 11/18/2015  . GERD   . Hepatitis    States he was tested for Hep B and he had antibodies  . HYPERLIPIDEMIA   . HYPERTENSION    dr tom wall  . PONV (postoperative nausea and vomiting)    little nausea after having his hip replacement  . Unspecified disorder of urethra and urinary tract     Family History  Problem Relation Age of Onset  . Colon cancer Mother        Colon Cancer-at advanced age  . Heart disease Father   . Breast cancer Sister     Past Surgical History:  Procedure Laterality Date  . COLONOSCOPY    . CORONARY ANGIOPLASTY WITH STENT PLACEMENT     06    dr t. wall  . ELECTROCARDIOGRAM  03/30/2007  . ESOPHAGOGASTRODUODENOSCOPY  11/07/2005  . kidney stones    . LUMBAR LAMINECTOMY Right 05/18/2014   Procedure: LUMBAR LAMINECTOMY Microdiscectomy Right Lumbar five - sacral one;  Surgeon: [name], MD;  Location: [facility];  Service: Orthopedics;  Laterality: Right;  . Stress Cardiolite  11/30/2004  . TOTAL HIP ARTHROPLASTY  10/28/2012   Procedure: TOTAL HIP ARTHROPLASTY;  Surgeon: [name], MD;  Location: [facility];  Service: Orthopedics;  Laterality: Right;   Social History    Occupational History  . 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 Smoking status: Current Every Day Smoker    Packs/day: 1.00    Years: 48.00    Pack years: 48.00    Types: Cigarettes  . Smokeless tobacco: Never Used  Substance and Sexual Activity  . Alcohol use: Yes    Alcohol/week: 0.0 oz    Comment: rare  . Drug use: No  . Sexual activity: Not on file

## 2017-12-29 ENCOUNTER — Ambulatory Visit: Payer: 59

## 2017-12-30 ENCOUNTER — Ambulatory Visit
Admission: RE | Admit: 2017-12-30 | Discharge: 2017-12-30 | Disposition: A | Discharge status: Home / Self Care | Payer: 59 | Source: Ambulatory Visit | Attending: Acute Care | Admitting: Acute Care

## 2017-12-30 DIAGNOSIS — F17210 Nicotine dependence, cigarettes, uncomplicated: Principal | ICD-10-CM

## 2018-01-07 ENCOUNTER — Other Ambulatory Visit: Payer: Self-pay | Admitting: Acute Care

## 2018-01-07 DIAGNOSIS — Z122 Encounter for screening for malignant neoplasm of respiratory organs: Secondary | ICD-10-CM

## 2018-01-07 DIAGNOSIS — F17210 Nicotine dependence, cigarettes, uncomplicated: Principal | ICD-10-CM

## 2018-01-23 ENCOUNTER — Encounter: Payer: Self-pay | Admitting: Cardiology

## 2018-01-25 ENCOUNTER — Other Ambulatory Visit: Payer: Self-pay | Admitting: Endocrinology

## 2018-01-29 ENCOUNTER — Other Ambulatory Visit: Payer: Self-pay | Admitting: Cardiology

## 2018-01-31 ENCOUNTER — Other Ambulatory Visit: Payer: Self-pay | Admitting: Cardiology

## 2018-01-31 DIAGNOSIS — I10 Essential (primary) hypertension: Secondary | ICD-10-CM

## 2018-01-31 DIAGNOSIS — E785 Hyperlipidemia, unspecified: Secondary | ICD-10-CM

## 2018-02-02 ENCOUNTER — Encounter: Payer: Self-pay | Admitting: Cardiology

## 2018-02-02 ENCOUNTER — Ambulatory Visit (INDEPENDENT_AMBULATORY_CARE_PROVIDER_SITE_OTHER): Payer: Managed Care, Other (non HMO) | Admitting: Cardiology

## 2018-02-02 VITALS — BP 144/80 | HR 66 | Ht 68.0 in | Wt 212.0 lb

## 2018-02-02 DIAGNOSIS — I10 Essential (primary) hypertension: Secondary | ICD-10-CM | POA: Diagnosis not present

## 2018-02-02 DIAGNOSIS — I2510 Atherosclerotic heart disease of native coronary artery without angina pectoris: Secondary | ICD-10-CM

## 2018-02-02 DIAGNOSIS — Z9861 Coronary angioplasty status: Secondary | ICD-10-CM | POA: Diagnosis not present

## 2018-02-02 DIAGNOSIS — E785 Hyperlipidemia, unspecified: Secondary | ICD-10-CM | POA: Diagnosis not present

## 2018-02-02 NOTE — Patient Instructions (Signed)
Medication Instructions:   Your physician recommends that you continue on your current medications as directed. Please refer to the Current Medication list given to you today.    Labwork:  TODAY--CMET, CBC W DIFF, TSH, LIPIDS    Follow-Up:  Your physician wants you to follow-up in: [place] will receive a reminder letter in the mail two months in advance. If you don't receive a letter, please call our office to schedule the follow-up appointment.        If you need a refill on your cardiac medications before your next appointment, please call your pharmacy.

## 2018-02-02 NOTE — Progress Notes (Signed)
Patient ID: JAJA SWITALSKI, male   DOB: 03/27/1951, 67 y.o.   MRN: [id number]     Patient Name: [name] [name] Date of Encounter: 02/02/2018  Primary Care Provider:  [name], MD Primary Cardiologist: [name]   Problem List   Past Medical History:  Diagnosis Date  . ANEMIA-IRON DEFICIENCY   . Arthritis   . BACK PAIN, LUMBAR   . Chronic kidney disease    kidney stone  . CORONARY ARTERY DISEASE   . DIVERTICULOSIS, COLON   . DM    on no medication ?prediabetes  . Elevated LFTs 11/18/2015  . GERD   . Hepatitis    States he was tested for Hep B and he had antibodies  . HYPERLIPIDEMIA   . HYPERTENSION    dr tom wall  . PONV (postoperative nausea and vomiting)    little nausea after having his hip replacement  . Unspecified disorder of urethra and urinary tract    Past Surgical History:  Procedure Laterality Date  . COLONOSCOPY    . CORONARY ANGIOPLASTY WITH STENT PLACEMENT     06    dr t. wall  . ELECTROCARDIOGRAM  03/30/2007  . ESOPHAGOGASTRODUODENOSCOPY  11/07/2005  . kidney stones    . LUMBAR LAMINECTOMY Right 05/18/2014   Procedure: LUMBAR LAMINECTOMY Microdiscectomy Right Lumbar five - sacral one;  Surgeon: [name], MD;  Location: [place];  Service: Orthopedics;  Laterality: Right;  . Stress Cardiolite  11/30/2004  . TOTAL HIP ARTHROPLASTY  10/28/2012   Procedure: TOTAL HIP ARTHROPLASTY;  Surgeon: [name], MD;  Location: [place];  Service: Orthopedics;  Laterality: Right;    Allergies  Allergies  Allergen Reactions  . Actos [Pioglitazone Hydrochloride]     Weight gain    HPI  Mr. Montz comes in for evaluation and management his coronary artery disease.He is a former patient of Dr Verl Blalock. He received coronary stenting after a positive stress test in 2006 with improved fatigue and SOB. He is very active and asymptomatic. No complains.   He denies any angina or chest discomfort. He said orthopnea, PND or edema. He denies any symptoms of TIAs or  mini strokes. He denies claudication. He continues to smoke but has cut way back he says. He seems to be compliant with his medication. He denies any presyncope or palpitations.  11/17/2015 - the patient is coming after 1 year. He stays active, working for [employer] and denies DOE or chest pain. They are not associated with diaphoresis, palpitations or dizziness. No LE edema, orthopnea or PND. He is complaint with his meds, just had lipids checked and all are at goal. Off Plavix since March 2015. A nuclear stress test in Dec 2015 was normal, normal LVEF as well.  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 He continues to smoke. He experiences occasional sharp second lasting pains in his left chest while in bed but no associated shortness of breath or dizziness.  02/02/2018, the patient is coming after one year, he has been doing great, he denies any chest pain shortness of breath, he likes to walk, he loves to shop, he doesn't exercise. 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 He has been compliant with his medication and  tolerates them well. He denies any palpitations, dizziness or syncope.  Home Medications  Prior to Admission medications   Medication Sig Start Date End Date Taking? Authorizing Provider  Ascorbic Acid (VITAMIN C) 1000 MG tablet Take 2,000 mg by mouth daily.   Yes Historical Provider, MD  atenolol (TENORMIN) 25 MG tablet Take 1 tablet (25 mg total) by mouth daily. 12/27/13  Yes [name], MD  atorvastatin (LIPITOR) 20 MG tablet Take 1 tablet (20 mg total) by mouth daily. 10/14/14  Yes [name], MD  Cholecalciferol (VITAMIN D) 2000 UNITS tablet Take 2,000 Units by mouth daily.   Yes Historical Provider, MD  clomiPHENE (CLOMID) 50 MG tablet Take 1/4  tablet by mouth once a day. APPOINTMENT NEEDED FOR FURTHER REFILLS. 10/10/14  Yes [name], MD  Coenzyme Q10 (CO Q 10) 100 MG CAPS Take 100 mg by mouth daily.    Yes Historical Provider, MD  DHA-EPA-VIT B6-B12-FOLIC ACID PO Take 1 tablet by mouth daily.    Yes Historical Provider, MD  lisinopril-hydrochlorothiazide (PRINZIDE,ZESTORETIC) 20-25 MG per tablet Take 1 tablet by mouth daily. 09/27/14  Yes Dorothy Spark, MD  Magnesium 400 MG CAPS Take 400 mg by mouth daily.    Yes Historical Provider, MD  meloxicam (MOBIC) 15 MG tablet Take 15 mg by mouth daily as needed for pain.  07/22/13  Yes Historical Provider, MD  metFORMIN (GLUCOPHAGE-XR) 500 MG 24 hr tablet Take 1 tablet (500 mg total) by mouth daily with breakfast. 10/17/14  Yes [name], MD  nitroGLYCERIN (NITROSTAT) 0.4 MG SL tablet Place 0.4 mg under the tongue every 5 (five) minutes as needed for chest pain.   Yes Historical Provider, MD  Zinc 50 MG TABS Take 50 mg by mouth daily.   Yes Historical Provider, MD    Family History  Family History  Problem Relation Age of Onset  . Colon cancer Mother        Colon Cancer-at advanced age  . Heart disease Father   . Breast cancer Sister     Social History  Social History   Socioeconomic History  . Marital status: Single    Spouse name: Not on file  . Number of children: 0  . Years of education: Not on file  . Highest education level: Not on file  Occupational History  . 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 Financial resource strain: Not on file  . Food insecurity:    Worry: Not on file    Inability: Not on file  . Transportation needs:    Medical: Not on file    Non-medical: Not on file  Tobacco Use  . Smoking status: Current Every Day Smoker    Packs/day: 1.00    Years: 48.00    Pack years: 48.00    Types: Cigarettes  . Smokeless tobacco: Never Used  Substance and Sexual Activity  . Alcohol  use: Yes    Alcohol/week: 0.0 oz    Comment: rare  . Drug use: No  . Sexual activity: Not on file  Lifestyle  . Physical activity:    Days per week: Not on file    Minutes per session: Not on file  . Stress: Not on file  Relationships  . Social connections:    Talks on phone: Not on file    Gets together: Not on file    Attends religious service: Not on file  Active member of club or organization: Not on file    Attends meetings of clubs or organizations: Not on file    Relationship status: Not on file  . Intimate partner violence:    Fear of current or ex partner: Not on file    Emotionally abused: Not on file    Physically abused: Not on file    Forced sexual activity: Not on file  Other Topics Concern  . Not on file  Social History Narrative  . Not on file     Review of Systems, as per HPI, otherwise negative General:  No chills, fever, night sweats or weight changes.  Cardiovascular:  No chest pain, dyspnea on exertion, edema, orthopnea, palpitations, paroxysmal nocturnal dyspnea. Dermatological: No rash, lesions/masses Respiratory: No cough, dyspnea Urologic: No hematuria, dysuria Abdominal:   No nausea, vomiting, diarrhea, bright red blood per rectum, melena, or hematemesis Neurologic:  No visual changes, wkns, changes in mental status. All other systems reviewed and are otherwise negative except as noted above.  Physical Exam  Blood pressure (!) 144/80, pulse 66, height 5' 8"  (1.727 m), weight 212 lb (96.2 kg), SpO2 95 %.  General: Pleasant, NAD Psych: Normal affect. Neuro: Alert and oriented X 3. Moves all extremities spontaneously. HEENT: Normal  Neck: Supple without bruits or JVD. Lungs:  Resp regular and unlabored, CTA. Heart: RRR no s3, s4, or murmurs. Abdomen: Soft, non-tender, non-distended, BS + x 4.  Extremities: No clubbing, cyanosis or edema. DP/PT/Radials 2+ and equal bilaterally.  Labs:  No results for input(s): CKTOTAL, CKMB, TROPONINI in  the last 72 hours. Lab Results  Component Value Date   WBC 9.0 12/03/2016   HGB 13.1 12/03/2016   HCT 40.8 12/03/2016   MCV 74.3 (L) 12/03/2016   PLT 246.0 12/03/2016    No results found for: DDIMER Invalid input(s): POCBNP    Component Value Date/Time   NA 142 01/03/2017 1045   K 4.4 01/03/2017 1045   CL 102 01/03/2017 1045   CO2 20 01/03/2017 1045   GLUCOSE 156 (H) 01/03/2017 1045   GLUCOSE 148 (H) 09/03/2016 1348   BUN 15 01/03/2017 1045   CREATININE 0.84 01/03/2017 1045   CREATININE 0.86 09/03/2016 1348   CALCIUM 10.0 01/03/2017 1045   PROT 6.8 01/03/2017 1045   ALBUMIN 4.5 01/03/2017 1045   AST 34 01/03/2017 1045   ALT 44 01/03/2017 1045   ALKPHOS 60 01/03/2017 1045   BILITOT <0.2 01/03/2017 1045   GFRNONAA 93 01/03/2017 1045   GFRAA 107 01/03/2017 1045   Lab Results  Component Value Date   CHOL 166 01/03/2017   HDL 51 01/03/2017   LDLCALC 85 01/03/2017   TRIG 148 01/03/2017    Accessory Clinical Findings  Echocardiogram - Stress echo: 2010 Stress results: Maximal heart rate during stress was 184bpm (112% of maximal predicted heart rate). The maximal predicted heart rate was 166bpm. The target heart rate was achieved. There was a normal resting blood pressure with a normal response to stress. The rate-pressure product for the peak heart rate and blood pressure was 26441m Hg/min. Functional capacity was normal. Stress ECG: No significant ST changes to suggest ischemia. Baseline: Peak stress: Stress echo results: No inducible wall motion abnormalities with exercise. Left ventricular ejection fraction was normal at rest and with stress. Normal echo stress   ECG - SR, RBBB, unchanged from last year. Personally reviewed.    Assessment & Plan  1. H/o CAD, s/p PCI (? Artery) in 2006, stable asymptomatic , continue  ASA, statin, BB. Normal nuclear stress test in December 2015. He has no change in symptoms. His EKG is unchanged from last year. I  have reviewed his chest CT done on 12/27/2017 and it shows stent in LAD, calcification in proximal circumflex and RCA.  2. Hypertension - controlled on current regimen.  3. Hyperlipidemia - Crestor decreased to 5 mg po 3x/week. Elevated LFTs in 10/17 - recheck LFTs and lipids today.  4. Smoking cessation - counseled  5. DM - uncontrolled, HbA1c 7.2%, recently started on Metformin.  Follow up in 1 year. Check CBC, CMP, TSH, lipids today.   [name], MD, [facility] 02/02/2018, 4:26 PM

## 2018-02-03 ENCOUNTER — Emergency Department (HOSPITAL_COMMUNITY)
Admission: EM | Admit: 2018-02-03 | Discharge: 2018-02-03 | Disposition: A | Discharge status: Home / Self Care | Payer: Managed Care, Other (non HMO) | Attending: Emergency Medicine | Admitting: Emergency Medicine

## 2018-02-03 ENCOUNTER — Other Ambulatory Visit: Payer: Self-pay

## 2018-02-03 ENCOUNTER — Telehealth: Payer: Self-pay | Admitting: Physician Assistant

## 2018-02-03 ENCOUNTER — Encounter (HOSPITAL_COMMUNITY): Payer: Self-pay

## 2018-02-03 DIAGNOSIS — Z96641 Presence of right artificial hip joint: Secondary | ICD-10-CM | POA: Insufficient documentation

## 2018-02-03 DIAGNOSIS — E119 Type 2 diabetes mellitus without complications: Secondary | ICD-10-CM | POA: Insufficient documentation

## 2018-02-03 DIAGNOSIS — I2510 Atherosclerotic heart disease of native coronary artery without angina pectoris: Secondary | ICD-10-CM | POA: Insufficient documentation

## 2018-02-03 DIAGNOSIS — R899 Unspecified abnormal finding in specimens from other organs, systems and tissues: Secondary | ICD-10-CM

## 2018-02-03 DIAGNOSIS — Z7984 Long term (current) use of oral hypoglycemic drugs: Secondary | ICD-10-CM | POA: Insufficient documentation

## 2018-02-03 DIAGNOSIS — F17210 Nicotine dependence, cigarettes, uncomplicated: Secondary | ICD-10-CM | POA: Insufficient documentation

## 2018-02-03 DIAGNOSIS — I10 Essential (primary) hypertension: Secondary | ICD-10-CM | POA: Diagnosis not present

## 2018-02-03 DIAGNOSIS — E875 Hyperkalemia: Secondary | ICD-10-CM | POA: Diagnosis not present

## 2018-02-03 DIAGNOSIS — Z79899 Other long term (current) drug therapy: Secondary | ICD-10-CM | POA: Diagnosis not present

## 2018-02-03 DIAGNOSIS — R799 Abnormal finding of blood chemistry, unspecified: Secondary | ICD-10-CM | POA: Diagnosis present

## 2018-02-03 DIAGNOSIS — R7989 Other specified abnormal findings of blood chemistry: Secondary | ICD-10-CM | POA: Diagnosis not present

## 2018-02-03 LAB — COMPREHENSIVE METABOLIC PANEL
ALT: 46 IU/L — ABNORMAL HIGH (ref 0–44)
AST: 52 IU/L — ABNORMAL HIGH (ref 0–40)
Albumin/Globulin Ratio: 1.7 (ref 1.2–2.2)
Albumin: 4.8 g/dL (ref 3.6–4.8)
Alkaline Phosphatase: 62 IU/L (ref 39–117)
BUN/Creatinine Ratio: 16 (ref 10–24)
BUN: 14 mg/dL (ref 8–27)
Bilirubin Total: 0.2 mg/dL (ref 0.0–1.2)
CO2: 18 mmol/L — ABNORMAL LOW (ref 20–29)
Calcium: 9.4 mg/dL (ref 8.6–10.2)
Chloride: 104 mmol/L (ref 96–106)
Creatinine, Ser: 0.88 mg/dL (ref 0.76–1.27)
GFR calc Af Amer: 104 mL/min/{1.73_m2} (ref >59)
GFR calc non Af Amer: 90 mL/min/{1.73_m2} (ref >59)
Globulin, Total: 2.8 g/dL (ref 1.5–4.5)
Glucose: 91 mg/dL (ref 65–99)
Potassium: 9.6 mmol/L (ref 3.5–5.2)
Sodium: 134 mmol/L (ref 134–144)
Total Protein: 7.6 g/dL (ref 6.0–8.5)

## 2018-02-03 LAB — TSH: TSH: 2.61 u[IU]/mL (ref 0.450–4.500)

## 2018-02-03 LAB — I-STAT CHEM 8, ED
BUN: 16 mg/dL (ref 6–20)
Calcium, Ion: 1.23 mmol/L (ref 1.15–1.40)
Chloride: 107 mmol/L (ref 101–111)
Creatinine, Ser: 0.8 mg/dL (ref 0.61–1.24)
Glucose, Bld: 109 mg/dL — ABNORMAL HIGH (ref 65–99)
HCT: 40 % (ref 39.0–52.0)
Hemoglobin: 13.6 g/dL (ref 13.0–17.0)
Potassium: 4.8 mmol/L (ref 3.5–5.1)
Sodium: 143 mmol/L (ref 135–145)
TCO2: 24 mmol/L (ref 22–32)

## 2018-02-03 LAB — CBC WITH DIFFERENTIAL/PLATELET
Basophils Absolute: 0.1 10*3/uL (ref 0.0–0.2)
Basos: 3 %
EOS (ABSOLUTE): 0.2 10*3/uL (ref 0.0–0.4)
Eos: 3 %
Hematocrit: 38.8 % (ref 37.5–51.0)
Hemoglobin: 11.8 g/dL — ABNORMAL LOW (ref 13.0–17.7)
Immature Grans (Abs): 0.1 10*3/uL (ref 0.0–0.1)
Immature Granulocytes: 2 %
Lymphocytes Absolute: 1.4 10*3/uL (ref 0.7–3.1)
Lymphs: 24 %
MCH: 21.5 pg — ABNORMAL LOW (ref 26.6–33.0)
MCHC: 30.4 g/dL — ABNORMAL LOW (ref 31.5–35.7)
MCV: 71 fL — ABNORMAL LOW (ref 79–97)
Monocytes Absolute: 0.7 10*3/uL (ref 0.1–0.9)
Monocytes: 13 %
Neutrophils Absolute: 3.1 10*3/uL (ref 1.4–7.0)
Neutrophils: 55 %
Platelets: 232 10*3/uL (ref 150–379)
RBC: 5.49 x10E6/uL (ref 4.14–5.80)
RDW: 18 % — ABNORMAL HIGH (ref 12.3–15.4)
WBC: 5.6 10*3/uL (ref 3.4–10.8)

## 2018-02-03 LAB — LIPID PANEL
Chol/HDL Ratio: 4.5 ratio (ref 0.0–5.0)
Cholesterol, Total: 199 mg/dL (ref 100–199)
HDL: 44 mg/dL (ref >39)
LDL Calculated: 108 mg/dL — ABNORMAL HIGH (ref 0–99)
Triglycerides: 233 mg/dL — ABNORMAL HIGH (ref 0–149)
VLDL Cholesterol Cal: 47 mg/dL — ABNORMAL HIGH (ref 5–40)

## 2018-02-03 NOTE — ED Triage Notes (Signed)
Pt endorses being sent by his MD for a "Potassium of 9" Pt has no complaints. EKG captured in triage.

## 2018-02-03 NOTE — Telephone Encounter (Signed)
[facility] paged after hours answering service - they paged me with multiple patients in the same span of time. Unclear why this potassium level was not reported yesterday as the labs were drawn yesterday. K was 9.6. [name] [name], Dr. Francesca Oman nurse, was already aware of the finding and looking at lab at the same time. I requested she call patient and advise he go to the ED immediately for recheck. Prior potassiums have been normal so unclear what has happened - the lab value does say "client requested flag". I told [name] [name] if he does not answer his phone to send a [facility] wellness check out to his house as this needs to be rechecked for safety purposes. Of note yesterdays EKG showed chronic RBBB without acute T Wave changes and he was felt to be doing well. Dayna Dunn PA-C

## 2018-02-03 NOTE — ED Provider Notes (Signed)
[place] EMERGENCY DEPARTMENT Provider Note   CSN: [id number] Arrival date & time: 02/03/18  2229     History   Chief Complaint Chief Complaint  Patient presents with  . Abnormal Lab    HPI [name] [name] is a 67 y.o. male.  Patient presents to the ED with a chief complaint of elevated potassium.  He states that he was sent to the ED by his cardiology office for a potassium of 9.  States he had lab work done yesterday at his checkup.  He denies any pain or symptoms of any kind.   The history is provided by the patient. No language interpreter was used.    Past Medical History:  Diagnosis Date  . ANEMIA-IRON DEFICIENCY   . Arthritis   . BACK PAIN, LUMBAR   . Chronic kidney disease    kidney stone  . CORONARY ARTERY DISEASE   . DIVERTICULOSIS, COLON   . DM    on no medication ?prediabetes  . Elevated LFTs 11/18/2015  . GERD   . Hepatitis    States he was tested for Hep B and he had antibodies  . HYPERLIPIDEMIA   . HYPERTENSION    dr tom wall  . PONV (postoperative nausea and vomiting)    little nausea after having his hip replacement  . Unspecified disorder of urethra and urinary tract     Patient Active Problem List   Diagnosis Date Noted  . Low testosterone 11/21/2017  . S/P PTCA (percutaneous transluminal coronary angioplasty) 11/18/2015  . Coronary artery disease due to lipid rich plaque 11/18/2015  . Elevated LFTs 11/18/2015  . Wellness examination 10/18/2015  . Smoker 10/18/2015  . HNP (herniated nucleus pulposus), lumbar 05/18/2014  . Hypercalcemia 09/29/2013  . Special screening for malignant neoplasms, colon 09/27/2013  . NASH (nonalcoholic steatohepatitis) 08/28/2012  . Screening for prostate cancer 08/28/2012  . Hypogonadism male 08/28/2012  . Other testicular hypofunction 07/22/2011  . Fatigue 07/05/2011  . RBBB 10/12/2010  . HEMORRHOIDS-INTERNAL 06/18/2010  . RECTAL BLEEDING 06/18/2010  . ARTHRALGIA 01/18/2010  .  TOBACCO USER 07/11/2009  . POSTURAL LIGHTHEADEDNESS 07/11/2009  . Anemia, iron deficiency 11/18/2008  . DIVERTICULOSIS, COLON 11/18/2008  . BACK PAIN, LUMBAR 09/01/2008  . SWEATING 07/26/2008  . Diabetes ([place] 01/20/2008  . COUGH 01/20/2008  . Dyslipidemia 06/02/2007  . Essential hypertension 06/02/2007  . CORONARY ARTERY DISEASE 06/02/2007  . GERD 06/02/2007    Past Surgical History:  Procedure Laterality Date  . COLONOSCOPY    . CORONARY ANGIOPLASTY WITH STENT PLACEMENT     06    dr t. wall  . ELECTROCARDIOGRAM  03/30/2007  . ESOPHAGOGASTRODUODENOSCOPY  11/07/2005  . kidney stones    . LUMBAR LAMINECTOMY Right 05/18/2014   Procedure: LUMBAR LAMINECTOMY Microdiscectomy Right Lumbar five - sacral one;  Surgeon: [name], MD;  Location: [place];  Service: Orthopedics;  Laterality: Right;  . Stress Cardiolite  11/30/2004  . TOTAL HIP ARTHROPLASTY  10/28/2012   Procedure: TOTAL HIP ARTHROPLASTY;  Surgeon: [name], MD;  Location: [place];  Service: Orthopedics;  Laterality: Right;        Home Medications    Prior to Admission medications   Medication Sig Start Date End Date Taking? Authorizing Provider  Ascorbic Acid (VITAMIN C) 1000 MG tablet Take 2,000 mg by mouth daily.    [provider]  atenolol (TENORMIN) 25 MG tablet TAKE 1 TABLET BY MOUTH EVERY DAY 08/23/17   [name], MD  b complex vitamins tablet Take 1 tablet by mouth daily.    [provider]  buPROPion (WELLBUTRIN XL) 150 MG 24 hr tablet Take 1 tablet (150 mg total) by mouth daily. 11/21/17   [name], MD  canagliflozin [facility]) 100 MG TABS tablet Take 1 tablet (100 mg total) by mouth daily before breakfast. 11/14/16   [name], MD  Cholecalciferol (VITAMIN D) 2000 UNITS tablet Take 2,000 Units by mouth daily.    [provider]  clomiPHENE (CLOMID) 50 MG tablet TAKE 1/4 TABLET BY MOUTH ONCE A DAY  01/25/18   [name], MD  Coenzyme Q10 (CO Q 10) 100 MG CAPS Take 100  mg by mouth daily.     [provider]  Cyanocobalamin (VITAMIN B-12 PO) Take 1 tablet by mouth daily.    [provider]  DHA-EPA-VIT B6-B12-FOLIC ACID PO Take 1 tablet by mouth daily.     [provider]  fluticasone (FLONASE) 50 MCG/ACT nasal spray Place 2 sprays into both nostrils daily. 03/03/15   [name], MD  FOLIC ACID PO Take 1 tablet by mouth daily.    [provider]  Ginkgo Biloba (GINKOBA PO) Take 1 tablet by mouth daily.    [provider]  lisinopril (PRINIVIL,ZESTRIL) 40 MG tablet TAKE 1 TABLET BY MOUTH EVERY DAY 11/03/17   Dorothy Spark, MD  Magnesium 400 MG CAPS Take 400 mg by mouth daily.     [provider]  metFORMIN (GLUCOPHAGE-XR) 500 MG 24 hr tablet TAKE 2 TABLETS BY MOUTH EVERY DAY WITH BREAKFAST 10/11/17   [name], MD  Multiple Vitamin (MULTIVITAMIN) tablet Take 1 tablet by mouth daily.    [provider]  nitroGLYCERIN (NITROSTAT) 0.4 MG SL tablet Place 1 tablet (0.4 mg total) under the tongue every 5 (five) minutes as needed for chest pain. 11/17/15   Dorothy Spark, MD  [facility] VERIO test strip TEST EVERY DAY 04/15/17   [name], MD  pantoprazole (PROTONIX) 40 MG tablet Take 1 tablet (40 mg total) by mouth daily. 10/30/17   Gatha Mayer, MD  rosuvastatin (CRESTOR) 5 MG tablet Take 1 tablet (5 mg total) by mouth 3 (three) times a week. Please keep upcoming appt for future refills. Thank you 01/30/18   Dorothy Spark, MD  VITAMIN A PO Take 1 tablet by mouth every other day.    [provider]  vitamin E 100 UNIT capsule Take 100 Units by mouth daily.    [provider]  Zinc 50 MG TABS Take 50 mg by mouth daily.    [provider]    Family History Family History  Problem Relation Age of Onset  . Colon cancer Mother        Colon Cancer-at advanced age  . Heart disease Father   . Breast cancer Sister     Social History Social History   Tobacco  Use  . Smoking status: Current Every Day Smoker    Packs/day: 1.00    Years: 48.00    Pack years: 48.00    Types: Cigarettes  . Smokeless tobacco: Never Used  Substance Use Topics  . Alcohol use: Yes    Alcohol/week: 0.0 oz    Comment: rare  . Drug use: No     Allergies   Actos [pioglitazone hydrochloride]   Review of Systems Review of Systems  All other systems reviewed and are negative.    Physical Exam Updated Vital Signs BP (!) 158/97 (BP  Location: Right Arm)   Pulse 71   Temp 99.1 F (37.3 C) (Oral)   Resp 18   Ht 5' 8"  (1.727 m)   Wt 96.2 kg (212 lb)   SpO2 97%   BMI 32.23 kg/m   Physical Exam  Constitutional: He is oriented to person, place, and time. He appears well-developed and well-nourished.  HENT:  Head: Normocephalic and atraumatic.  Eyes: Pupils are equal, round, and reactive to light. Conjunctivae and EOM are normal. Right eye exhibits no discharge. Left eye exhibits no discharge. No scleral icterus.  Neck: Normal range of motion. Neck supple. No JVD present.  Cardiovascular: Normal rate, regular rhythm and normal heart sounds. Exam reveals no gallop and no friction rub.  No murmur heard. Pulmonary/Chest: Effort normal and breath sounds normal. No respiratory distress. He has no wheezes. He has no rales. He exhibits no tenderness.  Abdominal: Soft. He exhibits no distension and no mass. There is no tenderness. There is no rebound and no guarding.  Musculoskeletal: Normal range of motion. He exhibits no edema or tenderness.  Neurological: He is alert and oriented to person, place, and time.  Skin: Skin is warm and dry.  Psychiatric: He has a normal mood and affect. His behavior is normal. Judgment and thought content normal.  Nursing note and vitals reviewed.    ED Treatments / Results  Labs (all labs ordered are listed, but only abnormal results are displayed) Labs Reviewed  I-STAT CHEM 8, ED    EKG None ED ECG REPORT  I personally  interpreted this EKG   Date: 02/03/2018   Rate: 64  Rhythm: normal sinus rhythm  QRS Axis: normal  Intervals: normal  ST/T Wave abnormalities: normal  Conduction Disutrbances:right bundle branch block  Narrative Interpretation:   Old EKG Reviewed: unchanged   Radiology No results found.  Procedures Procedures (including critical care time)  Medications Ordered in ED Medications - No data to display   Initial Impression / Assessment and Plan / ED Course  I have reviewed the triage vital signs and the nursing notes.  Pertinent labs & imaging results that were available during my care of the patient were reviewed by me and considered in my medical decision making (see chart for details).     Patient sent to ED for elevated potassium of >9.  Likely hemolysis.  Patient has no complaints.  No peaked t-waves.  Repeat K is 4.8.  Final Clinical Impressions(s) / ED Diagnoses   Final diagnoses:  Abnormal laboratory test result    ED Discharge Orders    None       Montine Circle, Hershal Coria 02/03/18 2009    Sherwood Gambler, MD 02/04/18 0000

## 2018-02-04 NOTE — Telephone Encounter (Signed)
Thank you :)

## 2018-02-04 NOTE — Telephone Encounter (Signed)
Thank you Dayna! Good that you checked ECG, Ivy please follow that he got repeat BMP done and if not he needs it today. Thank you, KN

## 2018-02-04 NOTE — Telephone Encounter (Signed)
Pt went to [facility] ER and lab was redrawn at that time.  Pts K level was 4.8 at the ER. Below was ER MD assessment and plan on this pt.    Initial Impression / Assessment and Plan / ED Course  I have reviewed the triage vital signs and the nursing notes.  Pertinent labs & imaging results that were available during my care of the patient were reviewed by me and considered in my medical decision making (see chart for details).   Patient sent to ED for elevated potassium of >9.  Likely hemolysis.  Patient has no complaints.  No peaked t-waves.  Repeat K is 4.8.

## 2018-02-08 ENCOUNTER — Other Ambulatory Visit: Payer: Self-pay | Admitting: Cardiology

## 2018-02-08 DIAGNOSIS — I10 Essential (primary) hypertension: Secondary | ICD-10-CM

## 2018-02-08 DIAGNOSIS — E785 Hyperlipidemia, unspecified: Secondary | ICD-10-CM

## 2018-02-09 ENCOUNTER — Other Ambulatory Visit: Payer: Self-pay | Admitting: Cardiology

## 2018-02-09 DIAGNOSIS — E785 Hyperlipidemia, unspecified: Secondary | ICD-10-CM

## 2018-02-09 DIAGNOSIS — I10 Essential (primary) hypertension: Secondary | ICD-10-CM

## 2018-02-09 MED ORDER — LISINOPRIL 40 MG PO TABS: 40.0000 mg | ORAL_TABLET | Freq: Every day | ORAL | 3 refills | Status: DC

## 2018-03-02 ENCOUNTER — Other Ambulatory Visit: Payer: Self-pay | Admitting: *Deleted

## 2018-03-02 MED ORDER — ROSUVASTATIN CALCIUM 5 MG PO TABS: 5.0000 mg | ORAL_TABLET | ORAL | 3 refills | Status: DC

## 2018-03-08 ENCOUNTER — Encounter: Payer: Self-pay | Admitting: Endocrinology

## 2018-03-12 ENCOUNTER — Other Ambulatory Visit: Payer: Self-pay | Admitting: Endocrinology

## 2018-03-12 MED ORDER — CANAGLIFLOZIN 300 MG PO TABS: ORAL_TABLET | ORAL | 5 refills | Status: DC

## 2018-03-13 IMAGING — CT CT CHEST LUNG CANCER SCREENING LOW DOSE W/O CM
1 of 2 series · 10 of 20 positions shown, 13 images · non-contrast
Comparison: 12/27/2016.

CLINICAL DATA: Current smoker, 49 pack-year history, lung cancer
screening.

EXAM:
CT CHEST WITHOUT CONTRAST LOW-DOSE FOR LUNG CANCER SCREENING
TECHNIQUE: Multidetector CT imaging of the chest was performed following the
standard protocol without IV contrast.

[ct lung segmentation data · axial · 0.84mm/px · z∈[+434,+434]mm · 10 of 277 frames shown]
[frame 1/277  mediastinal]
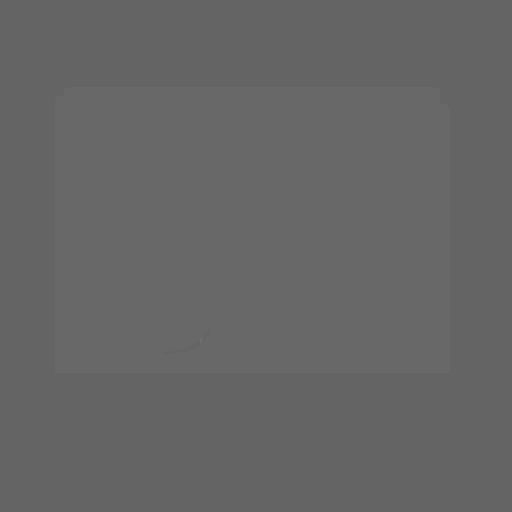
[frame 1/277  lung]
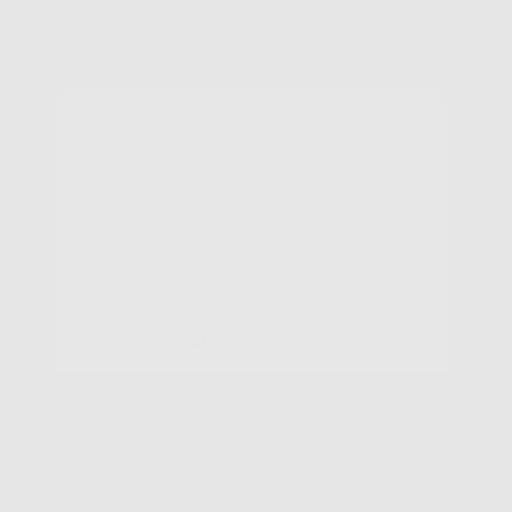
[frame 31/277  lung]
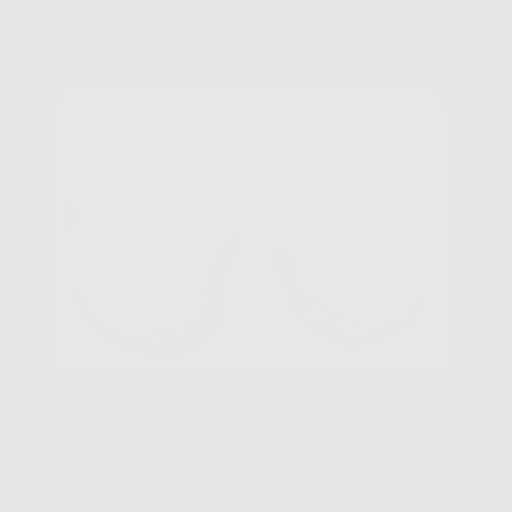
[frame 62/277  lung]
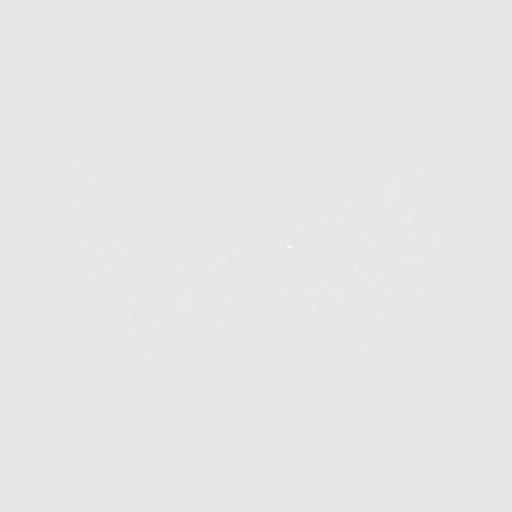
[frame 93/277  lung]
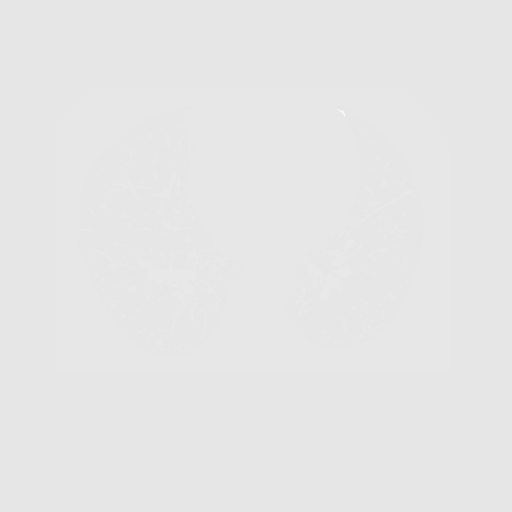
[frame 123/277  mediastinal]
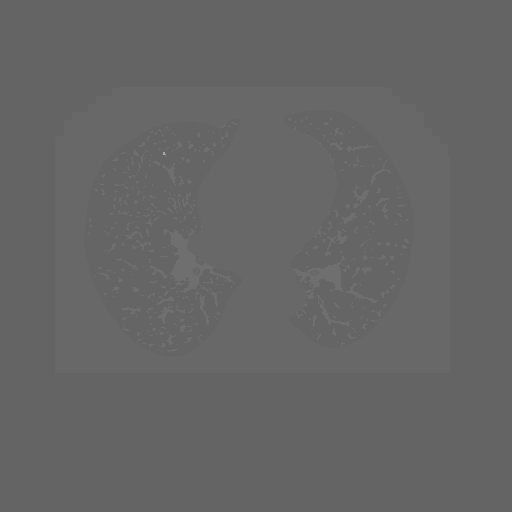
[frame 123/277  lung]
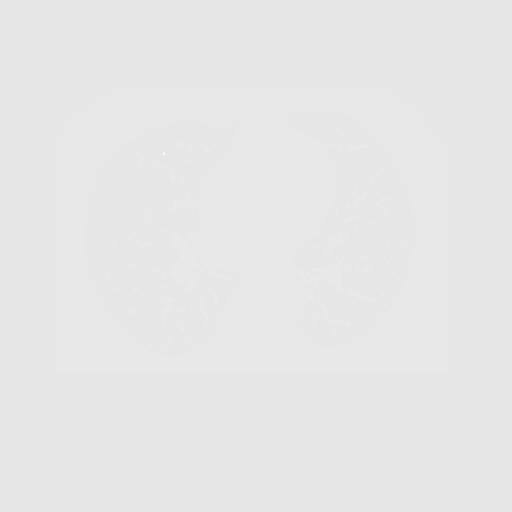
[frame 154/277  lung]
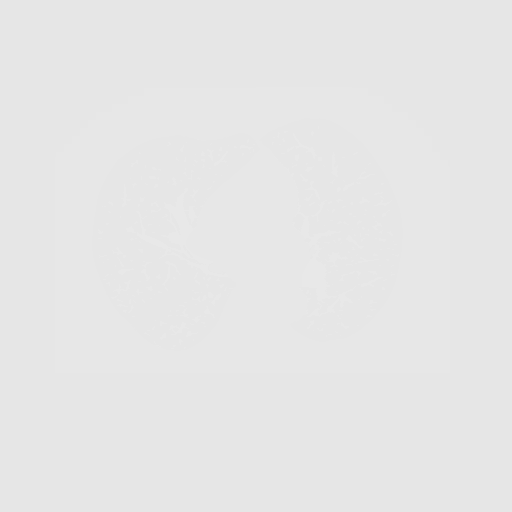
[frame 185/277  lung]
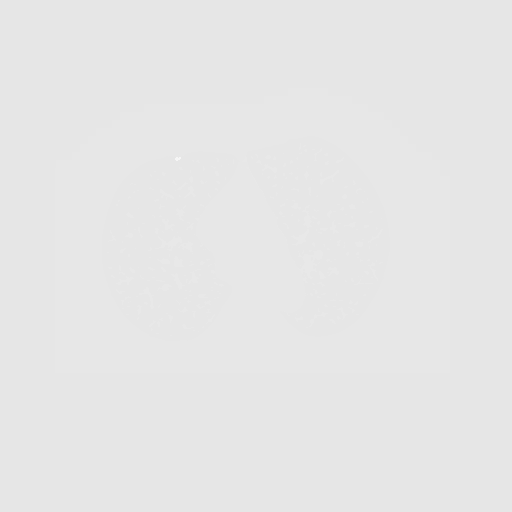
[frame 215/277  lung]
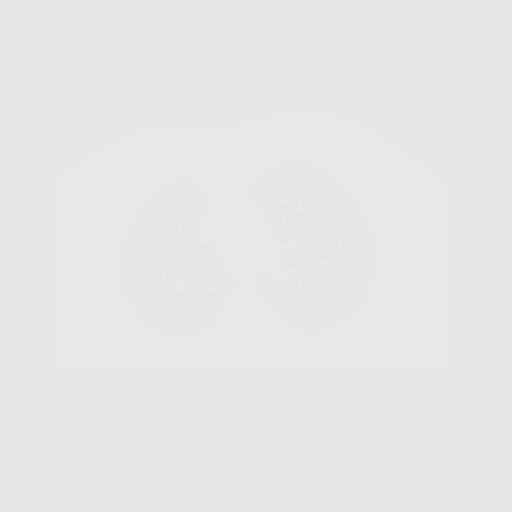
[frame 246/277  mediastinal]
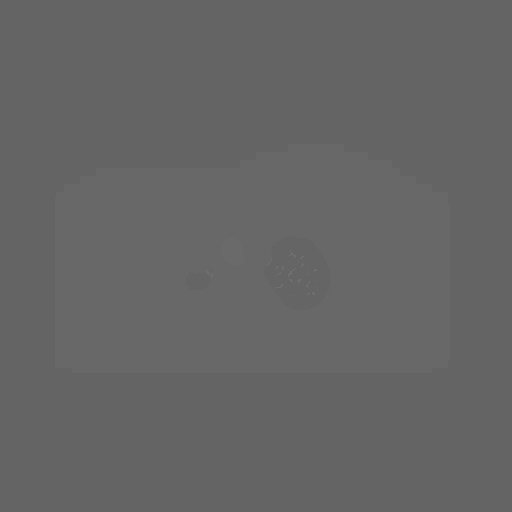
[frame 246/277  lung]
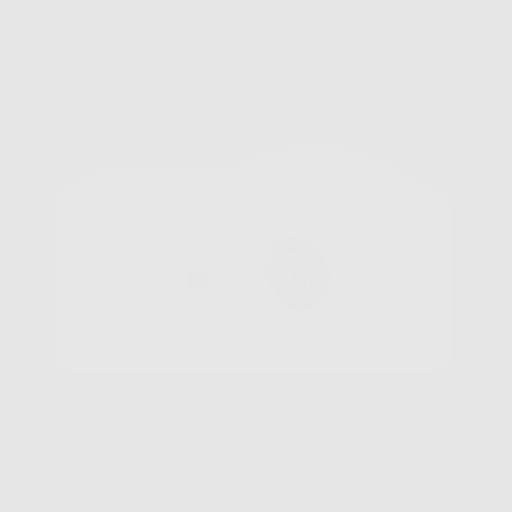
[frame 277/277  lung]
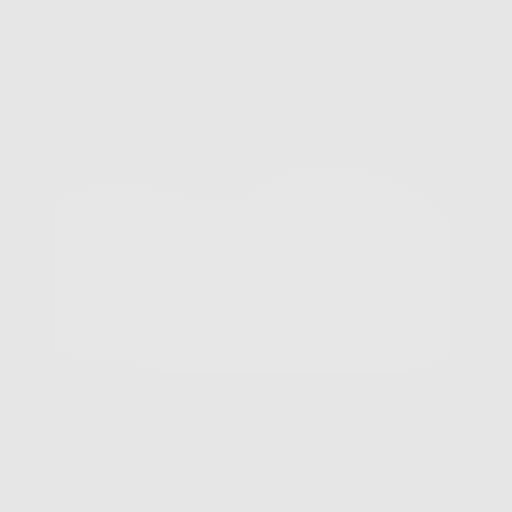

[10 of 20 positions shown; findings below may reference images not displayed]

FINDINGS: Cardiovascular: Atherosclerotic calcification of the arterial
vasculature, including extensive three-vessel involvement the
coronary arteries. Heart size normal. No pericardial effusion.

Mediastinum/Nodes: No pathologically enlarged mediastinal or
axillary lymph nodes. Hilar regions are difficult to definitively
evaluate without IV contrast but appear grossly unremarkable.
Esophagus is grossly unremarkable.

Lungs/Pleura: Moderate centrilobular and paraseptal emphysema. Mild
volume loss in the lingula. 3 mm right upper lobe nodule, unchanged.
No new pulmonary nodules. No pleural fluid. Airway is unremarkable.

Upper Abdomen: Visualized portions of the liver gallbladder and
adrenal glands are unremarkable. Small stone in the right kidney.
Visualized portions of the kidneys, spleen, pancreas, stomach and
bowel are otherwise grossly unremarkable.

Musculoskeletal: Degenerative changes in the spine. No worrisome
lytic or sclerotic lesions.
IMPRESSION: 1. Lung-RADS 2, benign appearance or behavior. Continue annual
screening with low-dose chest CT without contrast in 12 months.
2. Aortic atherosclerosis (TKEQB-170.0). Extensive three-vessel
coronary artery calcification.
3.  Emphysema (TKEQB-3ZO.Q).
4. Right renal stone.

## 2018-05-15 ENCOUNTER — Other Ambulatory Visit: Payer: Self-pay | Admitting: Endocrinology

## 2018-05-19 ENCOUNTER — Telehealth: Payer: Self-pay | Admitting: Endocrinology

## 2018-05-19 NOTE — Telephone Encounter (Signed)
[facility] is going to be faxing over some paper work for patients medication    clomiPHENE (CLOMID) 50 MG tablet   They stated if we would like to start a PA to call there office [phone] Option 0 Then  Option 3 then Option 3   REF# [id number]

## 2018-05-20 NOTE — Telephone Encounter (Signed)
I have spoken to [facility] & completed PA over the phone. They will notify us within 72 hours of outcome via fax.

## 2018-05-25 ENCOUNTER — Telehealth: Payer: Self-pay | Admitting: Endocrinology

## 2018-05-25 ENCOUNTER — Ambulatory Visit (INDEPENDENT_AMBULATORY_CARE_PROVIDER_SITE_OTHER): Payer: PPO | Admitting: Endocrinology

## 2018-05-25 ENCOUNTER — Encounter: Payer: Self-pay | Admitting: Endocrinology

## 2018-05-25 VITALS — BP 140/80 | HR 63 | Temp 99.0°F | Ht 68.0 in | Wt 211.2 lb

## 2018-05-25 DIAGNOSIS — I2510 Atherosclerotic heart disease of native coronary artery without angina pectoris: Secondary | ICD-10-CM | POA: Diagnosis not present

## 2018-05-25 DIAGNOSIS — E119 Type 2 diabetes mellitus without complications: Secondary | ICD-10-CM

## 2018-05-25 LAB — POCT GLYCOSYLATED HEMOGLOBIN (HGB A1C): Hemoglobin A1C: 6.1 % — AB (ref 4.0–5.6)

## 2018-05-25 MED ORDER — CANAGLIFLOZIN 300 MG PO TABS: 300.0000 mg | ORAL_TABLET | Freq: Every day | ORAL | 3 refills | Status: DC

## 2018-05-25 MED ORDER — VARENICLINE TARTRATE 0.5 MG PO TABS: 0.5000 mg | ORAL_TABLET | Freq: Two times a day (BID) | ORAL | 11 refills | Status: DC

## 2018-05-25 MED ORDER — CLOMIPHENE CITRATE 50 MG PO TABS: ORAL_TABLET | ORAL | 3 refills | Status: DC

## 2018-05-25 NOTE — Patient Instructions (Addendum)
Please continue the same medications, except I have sent a prescription to your pharmacy, for chantix.  Please come back for a follow-up appointment in 6 months.

## 2018-05-25 NOTE — Telephone Encounter (Signed)
[facility] called ph# [phone] re: RX they recvd for Invokana. Please call to clarify (one says 1 tblt daily-one says 1/2 tblt daily.

## 2018-05-25 NOTE — Telephone Encounter (Signed)
Should be 1 tab qd.  I corrected and resent.  Sorry about that.

## 2018-05-25 NOTE — Progress Notes (Signed)
Subjective:    Patient ID: [name] [name], male    DOB: [birth year], 67 y.o.   MRN: [id number]  HPI Pt returns for f/u of DM. DM type: 2 Dx'ed: 7948 Complications: CAD Therapy: 2 oral meds DKA: never Severe hypoglycemia: never.  Pancreatitis: never Other: he has never been on insulin; pioglitizone was favored due to NASH, but he did not tolerate (weight gain).   Interval history: he says cbg's are well-controlled.  pt states he feels well in general.   Past Medical History:  Diagnosis Date  . ANEMIA-IRON DEFICIENCY   . Arthritis   . BACK PAIN, LUMBAR   . Chronic kidney disease    kidney stone  . CORONARY ARTERY DISEASE   . DIVERTICULOSIS, COLON   . DM    on no medication ?prediabetes  . Elevated LFTs 11/18/2015  . GERD   . Hepatitis    States he was tested for Hep B and he had antibodies  . HYPERLIPIDEMIA   . HYPERTENSION    dr tom wall  . PONV (postoperative nausea and vomiting)    little nausea after having his hip replacement  . Unspecified disorder of urethra and urinary tract     Past Surgical History:  Procedure Laterality Date  . COLONOSCOPY    . CORONARY ANGIOPLASTY WITH STENT PLACEMENT     06    dr t. wall  . ELECTROCARDIOGRAM  03/30/2007  . ESOPHAGOGASTRODUODENOSCOPY  11/07/2005  . kidney stones    . LUMBAR LAMINECTOMY Right 05/18/2014   Procedure: LUMBAR LAMINECTOMY Microdiscectomy Right Lumbar five - sacral one;  Surgeon: [name], MD;  Location: [facility];  Service: Orthopedics;  Laterality: Right;  . Stress Cardiolite  11/30/2004  . TOTAL HIP ARTHROPLASTY  10/28/2012   Procedure: TOTAL HIP ARTHROPLASTY;  Surgeon: [name], MD;  Location: [facility];  Service: Orthopedics;  Laterality: Right;    Social History   Socioeconomic History  . Marital status: Single    Spouse name: Not on file  . Number of children: 0  . Years of education: Not on file  . Highest education level: Not on file  Occupational History  . 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 Financial resource strain: Not on file  . Food insecurity:    Worry: Not on file    Inability: Not on file  . Transportation needs:    Medical: Not on file    Non-medical: Not on file  Tobacco Use  . Smoking status: Current Every Day Smoker    Packs/day: 1.00    Years: 48.00    Pack years: 48.00    Types: Cigarettes  . Smokeless tobacco: Never Used  Substance and Sexual Activity  . Alcohol use: Yes    Alcohol/week: 0.0 oz    Comment: rare  . Drug use: No  . Sexual activity: Not on file  Lifestyle  . Physical activity:    Days per week: Not on file    Minutes per session: Not on file  . Stress: Not on file  Relationships  . Social connections:    Talks on phone: Not on file    Gets together: Not on file    Attends religious service: Not on file    Active member of club or organization: Not on file    Attends meetings of clubs or organizations: Not on file    Relationship status: Not  on file  . Intimate partner violence:    Fear of current or ex partner: Not on file    Emotionally abused: Not on file    Physically abused: Not on file    Forced sexual activity: Not on file  Other Topics Concern  . Not on file  Social History Narrative  . Not on file    Current Outpatient Medications on File Prior to Visit  Medication Sig Dispense Refill  . Ascorbic Acid (VITAMIN C) 1000 MG tablet Take 2,000 mg by mouth daily.    Marland Kitchen atenolol (TENORMIN) 25 MG tablet TAKE 1 TABLET BY MOUTH EVERY DAY 90 tablet 2  . b complex vitamins tablet Take 1 tablet by mouth daily.    . Cholecalciferol (VITAMIN D) 2000 UNITS tablet Take 2,000 Units by mouth daily.    . Coenzyme Q10 (CO Q 10) 100 MG CAPS Take 100 mg by mouth daily.     . Cyanocobalamin (VITAMIN B-12 PO) Take 1 tablet by mouth daily.    . DHA-EPA-VIT B6-B12-FOLIC ACID PO Take 1 tablet by mouth daily.     . fluticasone (FLONASE) 50 MCG/ACT nasal spray Place 2  sprays into both nostrils daily. 16 g 6  . FOLIC ACID PO Take 1 tablet by mouth daily.    . Ginkgo Biloba (GINKOBA PO) Take 1 tablet by mouth daily.    Marland Kitchen lisinopril (PRINIVIL,ZESTRIL) 40 MG tablet Take 1 tablet (40 mg total) by mouth daily. 90 tablet 3  . Magnesium 400 MG CAPS Take 400 mg by mouth daily.     . metFORMIN (GLUCOPHAGE-XR) 500 MG 24 hr tablet TAKE 2 TABLETS BY MOUTH EVERY DAY WITH BREAKFAST 60 tablet 11  . Multiple Vitamin (MULTIVITAMIN) tablet Take 1 tablet by mouth daily.    . nitroGLYCERIN (NITROSTAT) 0.4 MG SL tablet Place 1 tablet (0.4 mg total) under the tongue every 5 (five) minutes as needed for chest pain. 30 tablet 11  . ONETOUCH VERIO test strip TEST EVERY DAY 25 each 4  . pantoprazole (PROTONIX) 40 MG tablet Take 1 tablet (40 mg total) by mouth daily. 30 tablet 5  . rosuvastatin (CRESTOR) 5 MG tablet Take 1 tablet (5 mg total) by mouth 3 (three) times a week. 36 tablet 3  . VITAMIN A PO Take 1 tablet by mouth every other day.    . vitamin E 100 UNIT capsule Take 100 Units by mouth daily.    . Zinc 50 MG TABS Take 50 mg by mouth daily.     No current facility-administered medications on file prior to visit.     Allergies  Allergen Reactions  . Actos [Pioglitazone Hydrochloride]     Weight gain    Family History  Problem Relation Age of Onset  . Colon cancer Mother        Colon Cancer-at advanced age  . Heart disease Father   . Breast cancer Sister     BP 140/80 (BP Location: Left Arm, Patient Position: Sitting, Cuff Size: Normal)   Pulse 63   Temp 99 F (37.2 C) (Oral)   Ht 5' 8"  (1.727 m)   Wt 211 lb 3.2 oz (95.8 kg)   SpO2 94%   BMI 32.11 kg/m    Review of Systems E denies hypoglycemia.      Objective:   Physical Exam VITAL SIGNS:  See vs page GENERAL: no distress Pulses: foot pulses are intact bilaterally.   MSK: no deformity of the feet or ankles.  CV: no  edema of the legs or ankles Skin:  no ulcer on the feet or ankles.  normal color  and temp on the feet and ankles Neuro: sensation is intact to touch on the feet and ankles.     Lab Results  Component Value Date   HGBA1C 6.1 (A) 05/25/2018   Lab Results  Component Value Date   TSH 2.610 02/02/2018      Assessment & Plan:  Type 2 DM, with CAD: well-controlled. Smoker: persistent.  Patient Instructions  Please continue the same medications, except I have sent a prescription to your pharmacy, for chantix.  Please come back for a follow-up appointment in 6 months.

## 2018-05-25 NOTE — Telephone Encounter (Signed)
Please clarify

## 2018-05-26 ENCOUNTER — Telehealth: Payer: Self-pay | Admitting: Endocrinology

## 2018-05-26 NOTE — Telephone Encounter (Signed)
Patient requests new Rx for Invokana 100 mg tablets-90 day supply as discussed yesterday sent to [facility] on [address]. If questions please call patient.

## 2018-05-26 NOTE — Telephone Encounter (Signed)
Pharmacy informed.

## 2018-05-26 NOTE — Telephone Encounter (Signed)
Was sent in yesterday please have pt contact pharmacy

## 2018-06-04 ENCOUNTER — Other Ambulatory Visit: Payer: Self-pay

## 2018-06-04 MED ORDER — CLOMIPHENE CITRATE 50 MG PO TABS
ORAL_TABLET | ORAL | 3 refills | Status: DC
Start: 2018-06-04 — End: 2019-02-08

## 2018-07-03 ENCOUNTER — Encounter: Payer: Self-pay | Admitting: Endocrinology

## 2018-08-16 ENCOUNTER — Other Ambulatory Visit: Payer: Self-pay | Admitting: Internal Medicine

## 2018-09-03 ENCOUNTER — Ambulatory Visit (INDEPENDENT_AMBULATORY_CARE_PROVIDER_SITE_OTHER): Payer: Self-pay

## 2018-09-03 ENCOUNTER — Encounter (INDEPENDENT_AMBULATORY_CARE_PROVIDER_SITE_OTHER): Payer: Self-pay | Admitting: Surgery

## 2018-09-03 ENCOUNTER — Ambulatory Visit (INDEPENDENT_AMBULATORY_CARE_PROVIDER_SITE_OTHER): Payer: PPO | Admitting: Surgery

## 2018-09-03 DIAGNOSIS — M25561 Pain in right knee: Secondary | ICD-10-CM

## 2018-09-03 DIAGNOSIS — M545 Low back pain, unspecified: Secondary | ICD-10-CM

## 2018-09-03 DIAGNOSIS — G8929 Other chronic pain: Secondary | ICD-10-CM

## 2018-09-03 DIAGNOSIS — M1711 Unilateral primary osteoarthritis, right knee: Secondary | ICD-10-CM | POA: Diagnosis not present

## 2018-09-03 MED ORDER — METHOCARBAMOL 500 MG PO TABS: 500.0000 mg | ORAL_TABLET | Freq: Three times a day (TID) | ORAL | 0 refills | Status: DC | PRN

## 2018-09-03 NOTE — Progress Notes (Signed)
Office Visit Note   Patient: [name] [name]           Date of Birth: [birth year]           MRN: [id number] Visit Date: 09/03/2018              Requested by: [name], MD [address] [place], [place] [address] PCP: [name], MD   Assessment & Plan: Visit Diagnoses:  1. Chronic pain of right knee   2. Chronic low back pain, unspecified back pain laterality, unspecified whether sciatica present     Plan: I was wanting to give patient a Medrol Dosepak taper but he has a history of type 2 diabetes and I did explain to him the risk of this causing elevation of blood sugars.  I did like to give him a Toradol 30 mg IM injection today in the clinic.  Also given prescription for Robaxin 500 mg 1 tab p.o. every 8 hours as needed for spasms.  Follow-up with me in 3 weeks for recheck.  If he still continues to be symptomatic we will plan to get a lumbar MRI to rule out HNP/stenosis.  Lumbar spine x-rays reviewed with him today along with his right knee x-rays.  Follow-Up Instructions: Return in about 3 weeks (around 09/24/2018) for With Jeneen Rinks for recheck lumbar.   Orders:  Orders Placed This Encounter  Procedures  . XR KNEE 3 VIEW RIGHT  . XR Lumbar Spine 2-3 Views   Meds ordered this encounter  Medications  . methocarbamol (ROBAXIN) 500 MG tablet    Sig: Take 1 tablet (500 mg total) by mouth every 8 (eight) hours as needed for muscle spasms.    Dispense:  40 tablet    Refill:  0      Procedures: No procedures performed   Clinical Data: No additional findings.   Subjective: Chief Complaint  Patient presents with  . Lower Back - Pain  . Right Knee - Pain    HPI 67 year old white male comes in today with complaints of low back pain and right lower extremity radiculopathy with right knee pain.  Patient has known history of lumbar spondylosis.  Status post L5-S1 microdiscectomy by Dr. Lorin Mercy July 2015.  Has had off-and-on back pain for several years but  has had an acute flare x2 weeks after he lifted something in his attic preparing for a yard sale.  States that pain radiates in the right buttock down to the right mid calf area.  No radicular symptoms on the left.  He is also had ongoing problems with the right knee where he has been told that he has arthritis.  No complaints of knee mechanical symptoms.  Lumbar symptoms aggravated with bending twisting lifting.  Not describing true neurogenic claudication symptoms.  No complaints of bowel or bladder incontinence. Review of Systems No current cardiac pulmonary GI GU issues  Objective: Vital Signs: There were no vitals taken for this visit.  Physical Exam  Constitutional: He is oriented to person, place, and time. No distress.  HENT:  Head: Normocephalic and atraumatic.  Eyes: Pupils are equal, round, and reactive to light. EOM are normal.  Pulmonary/Chest: No respiratory distress.  Abdominal: He exhibits no distension.  Musculoskeletal:  Patient some discomfort with lumbar flexion and extension.  Positive lumbar paraspinal tenderness.  Positive right sciatic notch tenderness.  Negative straight leg raise.  Negative logroll bilateral hips.  Right knee mild patellofemoral crepitus.  Joint line nontender.  Neurological:  He is alert and oriented to person, place, and time.  Skin: Skin is warm and dry.  Psychiatric: He has a normal mood and affect.    Ortho Exam  Specialty Comments:  No specialty comments available.  Imaging: No results found.   PMFS History: Patient Active Problem List   Diagnosis Date Noted  . Low testosterone 11/21/2017  . S/P PTCA (percutaneous transluminal coronary angioplasty) 11/18/2015  . Coronary artery disease due to lipid rich plaque 11/18/2015  . Elevated LFTs 11/18/2015  . Wellness examination 10/18/2015  . Smoker 10/18/2015  . HNP (herniated nucleus pulposus), lumbar 05/18/2014  . Hypercalcemia 09/29/2013  . Special screening for malignant  neoplasms, colon 09/27/2013  . NASH (nonalcoholic steatohepatitis) 08/28/2012  . Screening for prostate cancer 08/28/2012  . Hypogonadism male 08/28/2012  . Other testicular hypofunction 07/22/2011  . Fatigue 07/05/2011  . RBBB 10/12/2010  . HEMORRHOIDS-INTERNAL 06/18/2010  . RECTAL BLEEDING 06/18/2010  . ARTHRALGIA 01/18/2010  . TOBACCO USER 07/11/2009  . POSTURAL LIGHTHEADEDNESS 07/11/2009  . Anemia, iron deficiency 11/18/2008  . DIVERTICULOSIS, COLON 11/18/2008  . BACK PAIN, LUMBAR 09/01/2008  . SWEATING 07/26/2008  . Diabetes ([place]) 01/20/2008  . COUGH 01/20/2008  . Dyslipidemia 06/02/2007  . Essential hypertension 06/02/2007  . CORONARY ARTERY DISEASE 06/02/2007  . GERD 06/02/2007   Past Medical History:  Diagnosis Date  . ANEMIA-IRON DEFICIENCY   . Arthritis   . BACK PAIN, LUMBAR   . Chronic kidney disease    kidney stone  . CORONARY ARTERY DISEASE   . DIVERTICULOSIS, COLON   . DM    on no medication ?prediabetes  . Elevated LFTs 11/18/2015  . GERD   . Hepatitis    States he was tested for Hep B and he had antibodies  . HYPERLIPIDEMIA   . HYPERTENSION    dr tom wall  . PONV (postoperative nausea and vomiting)    little nausea after having his hip replacement  . Unspecified disorder of urethra and urinary tract     Family History  Problem Relation Age of Onset  . Colon cancer Mother        Colon Cancer-at advanced age  . Heart disease Father   . Breast cancer Sister     Past Surgical History:  Procedure Laterality Date  . COLONOSCOPY    . CORONARY ANGIOPLASTY WITH STENT PLACEMENT     06    dr t. wall  . ELECTROCARDIOGRAM  03/30/2007  . ESOPHAGOGASTRODUODENOSCOPY  11/07/2005  . kidney stones    . LUMBAR LAMINECTOMY Right 05/18/2014   Procedure: LUMBAR LAMINECTOMY Microdiscectomy Right Lumbar five - sacral one;  Surgeon: [name], MD;  Location: [facility];  Service: Orthopedics;  Laterality: Right;  . Stress Cardiolite  11/30/2004  . TOTAL HIP ARTHROPLASTY   10/28/2012   Procedure: TOTAL HIP ARTHROPLASTY;  Surgeon: [name], MD;  Location: [facility];  Service: Orthopedics;  Laterality: Right;   Social History   Occupational History  . 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 Smoking status: Current Every Day Smoker    Packs/day: 1.00    Years: 48.00    Pack years: 48.00    Types: Cigarettes  . Smokeless tobacco: Never Used  Substance and Sexual Activity  . Alcohol use: Yes    Alcohol/week: 0.0 standard drinks    Comment: rare  . Drug use: No  . Sexual activity: Not on file

## 2018-09-04 ENCOUNTER — Telehealth (INDEPENDENT_AMBULATORY_CARE_PROVIDER_SITE_OTHER): Payer: Self-pay | Admitting: Surgery

## 2018-09-04 MED ORDER — TIZANIDINE HCL 2 MG PO TABS: 2.0000 mg | ORAL_TABLET | Freq: Three times a day (TID) | ORAL | 0 refills | Status: DC | PRN

## 2018-09-04 NOTE — Telephone Encounter (Signed)
Can you please advise?  Jeneen Rinks saw patient in clinic yesterday.  OK to switch muscle relaxer to one that is covered by insurance?

## 2018-09-04 NOTE — Telephone Encounter (Signed)
Switch to tizanidine 2 mg po q 8 hrs # 30 thanks

## 2018-09-04 NOTE — Telephone Encounter (Signed)
This is a Writer patient

## 2018-09-04 NOTE — Telephone Encounter (Signed)
Medication sent to the pharmacy. I called patient and advised.

## 2018-09-04 NOTE — Telephone Encounter (Signed)
Patient called stating that the Methocarbamol that Jeneen Rinks prescribed him needs a prior authorization through his insurance.  He also stated that the pharmacy advised him that there are two other medications that can be switched to, they are Baclofen or Tizanidine.  There was a faxed sent over from the pharmacy.  Patient's CB#[phone].  Thank you.

## 2018-09-08 ENCOUNTER — Telehealth (INDEPENDENT_AMBULATORY_CARE_PROVIDER_SITE_OTHER): Payer: Self-pay

## 2018-09-08 NOTE — Telephone Encounter (Signed)
Received a fax from [facility] stating that patient is approved for Methocarbamol 543m Tablet. Valid 09/04/2018- 11/11/2019.  CB# is [phone]  Fax# is [phone]

## 2018-09-12 ENCOUNTER — Other Ambulatory Visit: Payer: Self-pay | Admitting: Endocrinology

## 2018-09-24 ENCOUNTER — Encounter (INDEPENDENT_AMBULATORY_CARE_PROVIDER_SITE_OTHER): Payer: Self-pay | Admitting: Surgery

## 2018-09-24 ENCOUNTER — Ambulatory Visit (INDEPENDENT_AMBULATORY_CARE_PROVIDER_SITE_OTHER): Payer: PPO | Admitting: Surgery

## 2018-09-24 DIAGNOSIS — M5416 Radiculopathy, lumbar region: Secondary | ICD-10-CM | POA: Diagnosis not present

## 2018-09-24 NOTE — Progress Notes (Signed)
Office Visit Note   Patient: [name] [name]           Date of Birth: 02-05-1951           MRN: [id number] Visit Date: 09/24/2018              Requested by: [name], MD [address] [place], [place] [address] PCP: No primary care provider on file.   Assessment & Plan: Visit Diagnoses:  1. Radiculopathy, lumbar region     Plan:  With patient's ongoing symptoms that have failed conservative treatment I will go ahead and get lumbar MRI to rule out HNP/stenosis.  Patient follow-up in the office with Dr. Lorin Mercy after completion to discuss results and treatment options.  Follow-Up Instructions: Return in about 3 weeks (around 10/15/2018) for with dr yates to review lumbar mri.   Orders:  Orders Placed This Encounter  Procedures  . MR Lumbar Spine W Wo Contrast   No orders of the defined types were placed in this encounter.     Procedures: No procedures performed   Clinical Data: No additional findings.   Subjective: Chief Complaint  Patient presents with  . Lower Back - Follow-up    HPI 67 year old white male returns for recheck of low back pain and lower extreme radiculopathy.  States that his right-sided symptoms are somewhat improved but he continues have ongoing pain radiating into the left buttock and down his left leg.  Symptoms aggravated with bending, twisting, walking. Review of Systems No current cardiac pulmonary GI GU issues  Objective: Vital Signs: There were no vitals taken for this visit.  Physical Exam  Constitutional: He is oriented to person, place, and time. No distress.  HENT:  Head: Normocephalic and atraumatic.  Musculoskeletal:  Positive left-sided lumbar paraspinal tenderness.  Positive left sciatic notch tenderness.  Negative logroll.  Pain with left straight leg raise.  Neurological: He is alert and oriented to person, place, and time.    Ortho Exam  Specialty Comments:  No specialty comments  available.  Imaging: No results found.   PMFS History: Patient Active Problem List   Diagnosis Date Noted  . Low testosterone 11/21/2017  . S/P PTCA (percutaneous transluminal coronary angioplasty) 11/18/2015  . Coronary artery disease due to lipid rich plaque 11/18/2015  . Elevated LFTs 11/18/2015  . Wellness examination 10/18/2015  . Smoker 10/18/2015  . HNP (herniated nucleus pulposus), lumbar 05/18/2014  . Hypercalcemia 09/29/2013  . Special screening for malignant neoplasms, colon 09/27/2013  . NASH (nonalcoholic steatohepatitis) 08/28/2012  . Screening for prostate cancer 08/28/2012  . Hypogonadism male 08/28/2012  . Other testicular hypofunction 07/22/2011  . Fatigue 07/05/2011  . RBBB 10/12/2010  . HEMORRHOIDS-INTERNAL 06/18/2010  . RECTAL BLEEDING 06/18/2010  . ARTHRALGIA 01/18/2010  . TOBACCO USER 07/11/2009  . POSTURAL LIGHTHEADEDNESS 07/11/2009  . Anemia, iron deficiency 11/18/2008  . DIVERTICULOSIS, COLON 11/18/2008  . BACK PAIN, LUMBAR 09/01/2008  . SWEATING 07/26/2008  . Diabetes ([place]) 01/20/2008  . COUGH 01/20/2008  . Dyslipidemia 06/02/2007  . Essential hypertension 06/02/2007  . CORONARY ARTERY DISEASE 06/02/2007  . GERD 06/02/2007   Past Medical History:  Diagnosis Date  . ANEMIA-IRON DEFICIENCY   . Arthritis   . BACK PAIN, LUMBAR   . Chronic kidney disease    kidney stone  . CORONARY ARTERY DISEASE   . DIVERTICULOSIS, COLON   . DM    on no medication ?prediabetes  . Elevated LFTs 11/18/2015  . GERD   . Hepatitis  States he was tested for Hep B and he had antibodies  . HYPERLIPIDEMIA   . HYPERTENSION    dr tom wall  . PONV (postoperative nausea and vomiting)    little nausea after having his hip replacement  . Unspecified disorder of urethra and urinary tract     Family History  Problem Relation Age of Onset  . Colon cancer Mother        Colon Cancer-at advanced age  . Heart disease Father   . Breast cancer Sister     Past  Surgical History:  Procedure Laterality Date  . COLONOSCOPY    . CORONARY ANGIOPLASTY WITH STENT PLACEMENT     06    dr t. wall  . ELECTROCARDIOGRAM  03/30/2007  . ESOPHAGOGASTRODUODENOSCOPY  11/07/2005  . kidney stones    . LUMBAR LAMINECTOMY Right 05/18/2014   Procedure: LUMBAR LAMINECTOMY Microdiscectomy Right Lumbar five - sacral one;  Surgeon: [name], MD;  Location: [facility];  Service: Orthopedics;  Laterality: Right;  . Stress Cardiolite  11/30/2004  . TOTAL HIP ARTHROPLASTY  10/28/2012   Procedure: TOTAL HIP ARTHROPLASTY;  Surgeon: [name], MD;  Location: [facility];  Service: Orthopedics;  Laterality: Right;   Social History   Occupational History  . 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 Smoking status: Current Every Day Smoker    Packs/day: 1.00    Years: 48.00    Pack years: 48.00    Types: Cigarettes  . Smokeless tobacco: Never Used  Substance and Sexual Activity  . Alcohol use: Yes    Alcohol/week: 0.0 standard drinks    Comment: rare  . Drug use: No  . Sexual activity: Not on file

## 2018-09-28 ENCOUNTER — Encounter: Payer: Self-pay | Admitting: Endocrinology

## 2018-09-29 ENCOUNTER — Telehealth: Payer: Self-pay | Admitting: Endocrinology

## 2018-09-29 NOTE — Telephone Encounter (Signed)
Pt is coming to the office to pick up a 1-touch verio meter.  Than you.

## 2018-09-29 NOTE — Telephone Encounter (Signed)
Please advise 

## 2018-09-29 NOTE — Telephone Encounter (Signed)
Please be aware of pt arrival

## 2018-10-10 ENCOUNTER — Ambulatory Visit
Admission: RE | Admit: 2018-10-10 | Discharge: 2018-10-10 | Disposition: A | Discharge status: Home / Self Care | Payer: PPO | Source: Ambulatory Visit | Attending: Surgery | Admitting: Surgery

## 2018-10-10 DIAGNOSIS — M48061 Spinal stenosis, lumbar region without neurogenic claudication: Secondary | ICD-10-CM | POA: Diagnosis not present

## 2018-10-10 DIAGNOSIS — M5416 Radiculopathy, lumbar region: Secondary | ICD-10-CM

## 2018-10-10 DIAGNOSIS — M5116 Intervertebral disc disorders with radiculopathy, lumbar region: Secondary | ICD-10-CM | POA: Diagnosis not present

## 2018-10-10 MED ORDER — GADOBENATE DIMEGLUMINE 529 MG/ML IV SOLN
18.0000 mL | Freq: Once | INTRAVENOUS | Status: AC | PRN
  Administered 2018-10-10: 18 mL via INTRAVENOUS

## 2018-10-27 ENCOUNTER — Ambulatory Visit (INDEPENDENT_AMBULATORY_CARE_PROVIDER_SITE_OTHER): Payer: PPO | Admitting: Orthopaedic Surgery

## 2018-10-27 ENCOUNTER — Encounter (INDEPENDENT_AMBULATORY_CARE_PROVIDER_SITE_OTHER): Payer: Self-pay | Admitting: Orthopaedic Surgery

## 2018-10-27 VITALS — BP 148/84 | HR 52 | Ht 68.0 in | Wt 193.0 lb

## 2018-10-27 DIAGNOSIS — M5126 Other intervertebral disc displacement, lumbar region: Secondary | ICD-10-CM | POA: Diagnosis not present

## 2018-10-27 NOTE — Progress Notes (Signed)
Office Visit Note   Patient: [name] [name]           Date of Birth: 05-Dec-1950           MRN: [id number] Visit Date: 10/27/2018              Requested by: No referring provider defined for this encounter. PCP: Patient, No Pcp Per   Assessment & Plan: Visit Diagnoses:  1. Protrusion of lumbar intervertebral disc     Plan: Patient had some lumbar disc protrusion at L2-3 on the left.  At L2-3 slight retrolisthesis.  He has some subarticular disc osteophyte complex on the right this developed since his surgery 2015 but currently not having any right leg symptoms.  He is on medication for his diabetes and recently has had better control.  Will defer epidural injection with his diabetes if he gets increasing symptoms and starts having significant problems he can call and we can reconsider this.  Has some mild central stenosis at L3-4 primarily due to hypertrophic ligamentum and less to spurring.  MRI scan is reviewed again copy of the report.  Follow-Up Instructions: No follow-ups on file.   Orders:  No orders of the defined types were placed in this encounter.  No orders of the defined types were placed in this encounter.     Procedures: No procedures performed   Clinical Data: No additional findings.   Subjective: Chief Complaint  Patient presents with  . Spine - Follow-up    HPI 67 year old male returns with ongoing back pain left buttocks pain that radiates into his left leg down to the mid calf level.  He states he previously had more problems on the right but recently has had more problems on the left.  MRI scans been obtained and available for review.  Review of Systems right L5-S1 microdiscectomy July 2015, previous right total hip arthroplasty 2013.  14 point review systems otherwise negative as pertains HPI.   Objective: Vital Signs: BP (!) 148/84   Pulse (!) 52   Ht 5' 8"  (1.727 m)   Wt 193 lb (87.5 kg)   BMI 29.35 kg/m   Physical Exam Constitutional:        Appearance: He is well-developed.  HENT:     Head: Normocephalic and atraumatic.  Eyes:     Pupils: Pupils are equal, round, and reactive to light.  Neck:     Thyroid: No thyromegaly.     Trachea: No tracheal deviation.  Cardiovascular:     Rate and Rhythm: Normal rate.  Pulmonary:     Effort: Pulmonary effort is normal.     Breath sounds: No wheezing.  Abdominal:     General: Bowel sounds are normal.     Palpations: Abdomen is soft.  Skin:    General: Skin is warm and dry.     Capillary Refill: Capillary refill takes less than 2 seconds.  Neurological:     Mental Status: He is alert and oriented to person, place, and time.  Psychiatric:        Behavior: Behavior normal.        Thought Content: Thought content normal.        Judgment: Judgment normal.     Ortho Exam patient has normal normal heel toe gait.  Slow getting presenting standing at some sciatic notch tenderness some tenderness over the greater trochanter.  Full knee extension right and left.  Specialty Comments:  No specialty comments available.  Imaging: CLINICAL DATA:  Lumbar radiculopathy. Left leg pain. Lumbar laminectomy 05/18/2014  Creatinine was obtained on site at [facility] at [address]  Results: Creatinine 0.8 mg/dL.  EXAM: MRI LUMBAR SPINE WITHOUT AND WITH CONTRAST  TECHNIQUE: Multiplanar and multiecho pulse sequences of the lumbar spine were obtained without and with intravenous contrast.  CONTRAST:  37m MULTIHANCE GADOBENATE DIMEGLUMINE 529 MG/ML IV SOLN  COMPARISON:  Lumbar MRI 05/06/2014  FINDINGS: Segmentation:  Normal  Alignment:  Mild retrolisthesis L2-3  Vertebrae:  Negative for fracture or mass  Conus medullaris and cauda equina: Conus extends to the L1-2 level. Conus and cauda equina appear normal.  Paraspinal and other soft tissues: Negative for paraspinous mass or fluid collection  Disc levels:  L1-2: Negative  L2-3:  Left-sided disc protrusion slightly improved from the prior study. Subarticular and foraminal stenosis on the left. Negative for spinal stenosis. Mild facet degeneration  L3-4: Broad-based disc bulging with central disc protrusion similar to the prior study. Bilateral facet hypertrophy with mild spinal stenosis. Moderate subarticular stenosis bilaterally.  L4-5: Extraforaminal disc protrusion on the right has developed since the prior study. Bilateral facet hypertrophy with mild spinal stenosis.  L5-S1: Interval right laminectomy. Extraforaminal disc protrusion on the right appears slightly improved. Subarticular disc/osteophyte on the right has developed in the interval. This could be causing right S1 nerve root impingement.  IMPRESSION: Left-sided disc protrusion L2-3 has improved since 2015  Central disc protrusion and mild spinal stenosis L3-4 unchanged  Right extraforaminal disc protrusion L4-5 has developed since prior study with right L4 nerve root impingement mild spinal stenosis  Interval right laminectomy L5-S1. Right extraforaminal disc protrusion has improved. Subarticular zone disc/osteophyte on the right at L5-S1 could affect the right S1 nerve root. This could be chronic spurring which could be confirmed with CT.   Electronically Signed   By: CFranchot GalloM.D.   On: 10/10/2018 16:38    PMFS History: Patient Active Problem List   Diagnosis Date Noted  . Low testosterone 11/21/2017  . S/P PTCA (percutaneous transluminal coronary angioplasty) 11/18/2015  . Coronary artery disease due to lipid rich plaque 11/18/2015  . Elevated LFTs 11/18/2015  . Wellness examination 10/18/2015  . Smoker 10/18/2015  . HNP (herniated nucleus pulposus), lumbar 05/18/2014  . Hypercalcemia 09/29/2013  . Special screening for malignant neoplasms, colon 09/27/2013  . NASH (nonalcoholic steatohepatitis) 08/28/2012  . Screening for prostate cancer 08/28/2012  .  Hypogonadism male 08/28/2012  . Other testicular hypofunction 07/22/2011  . Fatigue 07/05/2011  . RBBB 10/12/2010  . HEMORRHOIDS-INTERNAL 06/18/2010  . RECTAL BLEEDING 06/18/2010  . ARTHRALGIA 01/18/2010  . TOBACCO USER 07/11/2009  . POSTURAL LIGHTHEADEDNESS 07/11/2009  . Anemia, iron deficiency 11/18/2008  . DIVERTICULOSIS, COLON 11/18/2008  . BACK PAIN, LUMBAR 09/01/2008  . SWEATING 07/26/2008  . Diabetes ([facility] 01/20/2008  . COUGH 01/20/2008  . Dyslipidemia 06/02/2007  . Essential hypertension 06/02/2007  . CORONARY ARTERY DISEASE 06/02/2007  . GERD 06/02/2007   Past Medical History:  Diagnosis Date  . ANEMIA-IRON DEFICIENCY   . Arthritis   . BACK PAIN, LUMBAR   . Chronic kidney disease    kidney stone  . CORONARY ARTERY DISEASE   . DIVERTICULOSIS, COLON   . DM    on no medication ?prediabetes  . Elevated LFTs 11/18/2015  . GERD   . Hepatitis    States he was tested for Hep B and he had antibodies  . HYPERLIPIDEMIA   . HYPERTENSION    dr tom wall  . PONV (  postoperative nausea and vomiting)    little nausea after having his hip replacement  . Unspecified disorder of urethra and urinary tract     Family History  Problem Relation Age of Onset  . Colon cancer Mother        Colon Cancer-at advanced age  . Heart disease Father   . Breast cancer Sister     Past Surgical History:  Procedure Laterality Date  . COLONOSCOPY    . CORONARY ANGIOPLASTY WITH STENT PLACEMENT     06    dr t. wall  . ELECTROCARDIOGRAM  03/30/2007  . ESOPHAGOGASTRODUODENOSCOPY  11/07/2005  . kidney stones    . LUMBAR LAMINECTOMY Right 05/18/2014   Procedure: LUMBAR LAMINECTOMY Microdiscectomy Right Lumbar five - sacral one;  Surgeon: [name], MD;  Location: [facility];  Service: Orthopedics;  Laterality: Right;  . Stress Cardiolite  11/30/2004  . TOTAL HIP ARTHROPLASTY  10/28/2012   Procedure: TOTAL HIP ARTHROPLASTY;  Surgeon: [name], MD;  Location: [facility];  Service: Orthopedics;   Laterality: Right;   Social History   Occupational History  . Occupation: Gaffer: [employer]    Comment: works Scientist, research (medical)  Tobacco Use  . Smoking status: Current Every Day Smoker    Packs/day: 1.00    Years: 48.00    Pack years: 48.00    Types: Cigarettes  . Smokeless tobacco: Never Used  Substance and Sexual Activity  . Alcohol use: Yes    Alcohol/week: 0.0 standard drinks    Comment: rare  . Drug use: No  . Sexual activity: Not on file

## 2018-11-20 ENCOUNTER — Other Ambulatory Visit: Payer: Self-pay | Admitting: Internal Medicine

## 2018-11-20 ENCOUNTER — Encounter: Payer: Self-pay | Admitting: Internal Medicine

## 2018-11-20 ENCOUNTER — Ambulatory Visit (INDEPENDENT_AMBULATORY_CARE_PROVIDER_SITE_OTHER): Payer: Medicare Other | Admitting: Internal Medicine

## 2018-11-20 ENCOUNTER — Ambulatory Visit: Payer: Medicare Other

## 2018-11-20 VITALS — BP 130/80 | HR 53 | Temp 98.3°F | Wt 199.9 lb

## 2018-11-20 DIAGNOSIS — F17200 Nicotine dependence, unspecified, uncomplicated: Secondary | ICD-10-CM

## 2018-11-20 DIAGNOSIS — M545 Low back pain, unspecified: Secondary | ICD-10-CM

## 2018-11-20 DIAGNOSIS — I10 Essential (primary) hypertension: Secondary | ICD-10-CM | POA: Diagnosis not present

## 2018-11-20 DIAGNOSIS — E119 Type 2 diabetes mellitus without complications: Secondary | ICD-10-CM

## 2018-11-20 DIAGNOSIS — E291 Testicular hypofunction: Secondary | ICD-10-CM

## 2018-11-20 DIAGNOSIS — Z23 Encounter for immunization: Secondary | ICD-10-CM

## 2018-11-20 DIAGNOSIS — E785 Hyperlipidemia, unspecified: Secondary | ICD-10-CM | POA: Diagnosis not present

## 2018-11-20 DIAGNOSIS — K7581 Nonalcoholic steatohepatitis (NASH): Secondary | ICD-10-CM

## 2018-11-20 DIAGNOSIS — I2510 Atherosclerotic heart disease of native coronary artery without angina pectoris: Secondary | ICD-10-CM

## 2018-11-20 DIAGNOSIS — F17210 Nicotine dependence, cigarettes, uncomplicated: Secondary | ICD-10-CM

## 2018-11-20 DIAGNOSIS — I2583 Coronary atherosclerosis due to lipid rich plaque: Secondary | ICD-10-CM

## 2018-11-20 LAB — POCT GLYCOSYLATED HEMOGLOBIN (HGB A1C): Hemoglobin A1C: 6 % — AB (ref 4.0–5.6)

## 2018-11-20 MED ORDER — ASPIRIN EC 81 MG PO TBEC: 81.0000 mg | DELAYED_RELEASE_TABLET | Freq: Every day | ORAL | 11 refills | Status: AC

## 2018-11-20 NOTE — Progress Notes (Signed)
Established Patient Office Visit     CC/Reason for Visit: Establish care, follow up on chronic medical conditions  HPI: [name] [name] is a 68 y.o. male who is coming in today for the above mentioned reasons.  He is due for an annual physical exam which he will schedule when he leaves today.  Past Medical History is significant for: Coronary artery disease. Follows with cardiology routinely.  He received coronary stenting after a positive stress test in 2006; he remains very active and asymptomatic.  Denies anginal chest discomfort.  Has a history of benign essential hypertension that has been well controlled, hyperlipidemia on a statin, continues to smoke 1 PPD and is not interested in smoking cessation at the moment, was prescribed chantix by his previous PCP but he never filled the prescription, also has type 2 diabetes with a most recent A1c that is over-year-old of 6.1. Also has a h/o testosterone deficiency and has been on clomiphene. Has never been on testosterone supplementation. No acute complaints at todays visit. Is requesting flu and PNA vaccinations.   Past Medical/Surgical History: Past Medical History:  Diagnosis Date  . ANEMIA-IRON DEFICIENCY   . Arthritis   . BACK PAIN, LUMBAR   . Chronic kidney disease    kidney stone  . CORONARY ARTERY DISEASE   . DIVERTICULOSIS, COLON   . DM    on no medication ?prediabetes  . Elevated LFTs 11/18/2015  . GERD   . Hepatitis    States he was tested for Hep B and he had antibodies  . HYPERLIPIDEMIA   . HYPERTENSION    dr tom wall  . PONV (postoperative nausea and vomiting)    little nausea after having his hip replacement  . Unspecified disorder of urethra and urinary tract     Past Surgical History:  Procedure Laterality Date  . COLONOSCOPY    . CORONARY ANGIOPLASTY WITH STENT PLACEMENT     06    dr t. wall  . ELECTROCARDIOGRAM  03/30/2007  . ESOPHAGOGASTRODUODENOSCOPY  11/07/2005  . kidney stones    . LUMBAR  LAMINECTOMY Right 05/18/2014   Procedure: LUMBAR LAMINECTOMY Microdiscectomy Right Lumbar five - sacral one;  Surgeon: [name], MD;  Location: [facility];  Service: Orthopedics;  Laterality: Right;  . Stress Cardiolite  11/30/2004  . TOTAL HIP ARTHROPLASTY  10/28/2012   Procedure: TOTAL HIP ARTHROPLASTY;  Surgeon: [name], MD;  Location: [facility];  Service: Orthopedics;  Laterality: Right;    Social History:  reports that he has been smoking cigarettes. He has a 48.00 pack-year smoking history. He has never used smokeless tobacco. He reports current alcohol use. He reports that he does not use drugs.  Allergies: Allergies  Allergen Reactions  . Actos [Pioglitazone Hydrochloride]     Weight gain    Family History:  Family History  Problem Relation Age of Onset  . Colon cancer Mother        Colon Cancer-at advanced age  . Heart disease Father   . Breast cancer Sister      Current Outpatient Medications:  .  Ascorbic Acid (VITAMIN C) 1000 MG tablet, Take 2,000 mg by mouth daily., Disp: , Rfl:  .  atenolol (TENORMIN) 25 MG tablet, TAKE 1 TABLET BY MOUTH EVERY DAY, Disp: 90 tablet, Rfl: 2 .  b complex vitamins tablet, Take 1 tablet by mouth daily., Disp: , Rfl:  .  canagliflozin (INVOKANA) 300 MG TABS tablet, Take 1 tablet (300 mg  total) by mouth daily., Disp: 90 tablet, Rfl: 3 .  Cholecalciferol (VITAMIN D) 2000 UNITS tablet, Take 2,000 Units by mouth daily., Disp: , Rfl:  .  clomiPHENE (CLOMID) 50 MG tablet, Take 1qd, Disp: 90 tablet, Rfl: 3 .  Coenzyme Q10 (CO Q 10) 100 MG CAPS, Take 100 mg by mouth daily. , Disp: , Rfl:  .  Cyanocobalamin (VITAMIN B-12 PO), Take 1 tablet by mouth daily., Disp: , Rfl:  .  DHA-EPA-VIT B6-B12-FOLIC ACID PO, Take 1 tablet by mouth daily. , Disp: , Rfl:  .  fluticasone (FLONASE) 50 MCG/ACT nasal spray, Place 2 sprays into both nostrils daily., Disp: 16 g, Rfl: 6 .  FOLIC ACID PO, Take 1 tablet by mouth daily., Disp: , Rfl:  .  Ginkgo Biloba  (GINKOBA PO), Take 1 tablet by mouth daily., Disp: , Rfl:  .  lisinopril (PRINIVIL,ZESTRIL) 40 MG tablet, Take 1 tablet (40 mg total) by mouth daily., Disp: 90 tablet, Rfl: 3 .  Magnesium 400 MG CAPS, Take 400 mg by mouth daily. , Disp: , Rfl:  .  metFORMIN (GLUCOPHAGE-XR) 500 MG 24 hr tablet, TAKE 2 TABLETS BY MOUTH EVERY DAY WITH BREAKFAST, Disp: 180 tablet, Rfl: 3 .  Multiple Vitamin (MULTIVITAMIN) tablet, Take 1 tablet by mouth daily., Disp: , Rfl:  .  nitroGLYCERIN (NITROSTAT) 0.4 MG SL tablet, Place 1 tablet (0.4 mg total) under the tongue every 5 (five) minutes as needed for chest pain., Disp: 30 tablet, Rfl: 11 .  ONETOUCH VERIO test strip, TEST EVERY DAY, Disp: 25 each, Rfl: 4 .  pantoprazole (PROTONIX) 40 MG tablet, TAKE 1 TABLET BY MOUTH EVERY DAY, Disp: 30 tablet, Rfl: 2 .  rosuvastatin (CRESTOR) 5 MG tablet, Take 1 tablet (5 mg total) by mouth 3 (three) times a week., Disp: 36 tablet, Rfl: 3 .  tiZANidine (ZANAFLEX) 2 MG tablet, Take 1 tablet (2 mg total) by mouth every 8 (eight) hours as needed for muscle spasms., Disp: 30 tablet, Rfl: 0 .  varenicline (CHANTIX) 0.5 MG tablet, Take 1 tablet (0.5 mg total) by mouth 2 (two) times daily., Disp: 60 tablet, Rfl: 11 .  VITAMIN A PO, Take 1 tablet by mouth every other day., Disp: , Rfl:  .  vitamin E 100 UNIT capsule, Take 100 Units by mouth daily., Disp: , Rfl:  .  Zinc 50 MG TABS, Take 50 mg by mouth daily., Disp: , Rfl:  .  aspirin EC 81 MG tablet, Take 1 tablet (81 mg total) by mouth daily., Disp: 30 tablet, Rfl: 11  Review of Systems:  Constitutional: Denies fever, chills, diaphoresis, appetite change and fatigue.  HEENT: Denies photophobia, eye pain, redness, hearing loss, ear pain, congestion, sore throat, rhinorrhea, sneezing, mouth sores, trouble swallowing, neck pain, neck stiffness and tinnitus.   Respiratory: Denies SOB, DOE, cough, chest tightness,  and wheezing.   Cardiovascular: Denies chest pain, palpitations and leg  swelling.  Gastrointestinal: Denies nausea, vomiting, abdominal pain, diarrhea, constipation, blood in stool and abdominal distention.  Genitourinary: Denies dysuria, urgency, frequency, hematuria, flank pain and difficulty urinating.  Endocrine: Denies: hot or cold intolerance, sweats, changes in hair or nails, polyuria, polydipsia. Musculoskeletal: Denies myalgias, back pain, joint swelling, arthralgias and gait problem.  Skin: Denies pallor, rash and wound.  Neurological: Denies dizziness, seizures, syncope, weakness, light-headedness, numbness and headaches.  Hematological: Denies adenopathy. Easy bruising, personal or family bleeding history  Psychiatric/Behavioral: Denies suicidal ideation, mood changes, confusion, nervousness, sleep disturbance and agitation    Physical Exam:  Vitals:   11/20/18 1310  BP: 130/80  Pulse: (!) 53  Temp: 98.3 F (36.8 C)  TempSrc: Oral  SpO2: 96%  Weight: 199 lb 14.4 oz (90.7 kg)    Body mass index is 30.39 kg/m.   Constitutional: NAD, calm, comfortable Eyes: PERRL, lids and conjunctivae normal, wears corrective lenses ENMT: Mucous membranes are moist.  Respiratory: clear to auscultation bilaterally, no wheezing, no crackles. Normal respiratory effort. No accessory muscle use.  Cardiovascular: Regular rate and rhythm, no murmurs / rubs / gallops. No extremity edema. 2+ pedal pulses. No carotid bruits.  Abdomen: no tenderness, no masses palpated. No hepatosplenomegaly. Bowel sounds positive.  Musculoskeletal: no clubbing / cyanosis. No joint deformity upper and lower extremities. Good ROM, no contractures. Normal muscle tone.  Neurologic: CN 2-12 grossly intact. Sensation intact, DTR normal. Strength 5/5 in all 4.  Psychiatric: Normal judgment and insight. Alert and oriented x 3. Normal mood.    Impression and Plan:  Type 2 diabetes mellitus without complication, without long-term current use of insulin (HCC)  - Lab Results  Component  Value Date   HGBA1C 6.0 (A) 11/20/2018  -A1C today continues to demonstrate good diabetic control at 6.0. -Continue current regimen of metformin and invokana. -Continue to counsel on lifestyle modifications.    Essential hypertension -Well controlled on current medications.  Dyslipidemia -Last LDL 108 in 3/19. -On rosuvastatin 5 mg.  TOBACCO USER -I have discussed tobacco cessation with the patient.  I have counseled the patient regarding the negative impacts of continued tobacco use including but not limited to lung cancer, COPD, and cardiovascular disease.  I have discussed alternatives to tobacco and modalities that may help facilitate tobacco cessation including but not limited to biofeedback, hypnosis, and medications.  Total time spent with tobacco counseling was 4 minutes. -He is not interested in quitting at this time, but will think about the information I have given him.    Low back pain without sciatica, unspecified back pain laterality, unspecified chronicity -Manages this with OTC meds.  Testosterone deficiency in male -Has been on clomiphene? -Early am testosterone level and supplementation with androgen gel if needed.  NASH (nonalcoholic steatohepatitis) -His elevated LFTs are the reason why he is on suboptimal doses of statin.  Coronary artery disease due to lipid rich plaque -Stable, follows routinely with cardiology.  Needs flu shot - Plan: Flu vaccine HIGH DOSE PF (Fluzone High dose)  Need for prophylactic vaccination against Streptococcus pneumoniae (pneumococcus) - Plan: Pneumococcal polysaccharide vaccine 23-valent greater than or equal to 2yo subcutaneous/IM    Patient Instructions  -It was nice meeting you today.  -Flu and pneumonia vaccinations today.  -Schedule your annual physical before 3 months have passed, make it an early morning appointment if possible so we can do lab work ( :) ). Please come in fasting to that visit.     Lelon Frohlich, MD [facility]

## 2018-11-20 NOTE — Patient Instructions (Signed)
-  It was nice meeting you today.  -Flu and pneumonia vaccinations today.  -Schedule your annual physical before 3 months have passed, make it an early morning appointment if possible so we can do lab work ( :) ). Please come in fasting to that visit.

## 2018-11-27 ENCOUNTER — Telehealth: Payer: Self-pay

## 2018-11-27 NOTE — Progress Notes (Deleted)
Subjective:   [name] [name] is a 68 y.o. male who presents for Medicare Annual/Subsequent preventive examination.  Review of Systems:  No ROS.  Medicare Wellness Visit. Additional risk factors are reflected in the social history.    Sleep patterns: {SX; SLEEP PATTERNS:18802::"feels rested on waking","does not get up to void","gets up *** times nightly to void","sleeps *** hours nightly"}.    Home Safety/Smoke Alarms: Feels safe in home. Smoke alarms in place.  Living environment; residence and Firearm Safety: {Rehab home environment / accessibility:30080::"no firearms","firearms stored safely"}. Seat Belt Safety/Bike Helmet: Wears seat belt.   Male:   CCS-     PSA-  Lab Results  Component Value Date   PSA 0.56 11/21/2017   PSA 0.44 11/19/2016   PSA 0.37 10/18/2015       Objective:    Vitals: There were no vitals taken for this visit.  There is no height or weight on file to calculate BMI.  Advanced Directives 05/20/2017 10/18/2015 05/06/2014 10/28/2012 10/19/2012  Does Patient Have a Medical Advance Directive? No No Patient has advance directive, copy not in chart Patient has advance directive, copy not in chart -  Type of Advance Directive - - Living will;Healthcare Power of [place];Living will -  Copy of [place] in Chart? - - Copy requested from family - -  Pre-existing out of facility DNR order (yellow form or pink MOST form) - - - - No    Tobacco Social History   Tobacco Use  Smoking Status Current Every Day Smoker  . Packs/day: 1.00  . Years: 48.00  . Pack years: 48.00  . Types: Cigarettes  Smokeless Tobacco Never Used     Ready to quit: Not Answered Counseling given: Not Answered   Past Medical History:  Diagnosis Date  . ANEMIA-IRON DEFICIENCY   . Arthritis   . BACK PAIN, LUMBAR   . Chronic kidney disease    kidney stone  . CORONARY ARTERY DISEASE   . DIVERTICULOSIS, COLON   . DM    on no  medication ?prediabetes  . Elevated LFTs 11/18/2015  . GERD   . Hepatitis    States he was tested for Hep B and he had antibodies  . HYPERLIPIDEMIA   . HYPERTENSION    dr tom wall  . PONV (postoperative nausea and vomiting)    little nausea after having his hip replacement  . Unspecified disorder of urethra and urinary tract    Past Surgical History:  Procedure Laterality Date  . COLONOSCOPY    . CORONARY ANGIOPLASTY WITH STENT PLACEMENT     06    dr t. wall  . ELECTROCARDIOGRAM  03/30/2007  . ESOPHAGOGASTRODUODENOSCOPY  11/07/2005  . kidney stones    . LUMBAR LAMINECTOMY Right 05/18/2014   Procedure: LUMBAR LAMINECTOMY Microdiscectomy Right Lumbar five - sacral one;  Surgeon: [name], MD;  Location: [facility];  Service: Orthopedics;  Laterality: Right;  . Stress Cardiolite  11/30/2004  . TOTAL HIP ARTHROPLASTY  10/28/2012   Procedure: TOTAL HIP ARTHROPLASTY;  Surgeon: [name], MD;  Location: [facility];  Service: Orthopedics;  Laterality: Right;   Family History  Problem Relation Age of Onset  . Colon cancer Mother        Colon Cancer-at advanced age  . Heart disease Father   . Breast cancer Sister    Social History   Socioeconomic History  . Marital status: Single    Spouse name: Not on  file  . Number of children: 0  . Years of education: Not on file  . Highest education level: Not on file  Occupational History  . 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 Financial resource strain: Not on file  . Food insecurity:    Worry: Not on file    Inability: Not on file  . Transportation needs:    Medical: Not on file    Non-medical: Not on file  Tobacco Use  . Smoking status: Current Every Day Smoker    Packs/day: 1.00    Years: 48.00    Pack years: 48.00    Types: Cigarettes  . Smokeless tobacco: Never Used  Substance and Sexual Activity  . Alcohol use: Yes    Alcohol/week: 0.0 standard drinks     Comment: rare  . Drug use: No  . Sexual activity: Not on file  Lifestyle  . Physical activity:    Days per week: Not on file    Minutes per session: Not on file  . Stress: Not on file  Relationships  . Social connections:    Talks on phone: Not on file    Gets together: Not on file    Attends religious service: Not on file    Active member of club or organization: Not on file    Attends meetings of clubs or organizations: Not on file    Relationship status: Not on file  Other Topics Concern  . Not on file  Social History Narrative  . Not on file    Outpatient Encounter Medications as of 12/01/2018  Medication Sig  . Ascorbic Acid (VITAMIN C) 1000 MG tablet Take 2,000 mg by mouth daily.  Marland Kitchen aspirin EC 81 MG tablet Take 1 tablet (81 mg total) by mouth daily.  Marland Kitchen atenolol (TENORMIN) 25 MG tablet TAKE 1 TABLET BY MOUTH EVERY DAY  . b complex vitamins tablet Take 1 tablet by mouth daily.  . canagliflozin (INVOKANA) 300 MG TABS tablet Take 1 tablet (300 mg total) by mouth daily.  . Cholecalciferol (VITAMIN D) 2000 UNITS tablet Take 2,000 Units by mouth daily.  . clomiPHENE (CLOMID) 50 MG tablet Take 1qd  . Coenzyme Q10 (CO Q 10) 100 MG CAPS Take 100 mg by mouth daily.   . Cyanocobalamin (VITAMIN B-12 PO) Take 1 tablet by mouth daily.  . DHA-EPA-VIT B6-B12-FOLIC ACID PO Take 1 tablet by mouth daily.   . fluticasone (FLONASE) 50 MCG/ACT nasal spray Place 2 sprays into both nostrils daily.  Marland Kitchen FOLIC ACID PO Take 1 tablet by mouth daily.  . Ginkgo Biloba (GINKOBA PO) Take 1 tablet by mouth daily.  Marland Kitchen lisinopril (PRINIVIL,ZESTRIL) 40 MG tablet Take 1 tablet (40 mg total) by mouth daily.  . Magnesium 400 MG CAPS Take 400 mg by mouth daily.   . metFORMIN (GLUCOPHAGE-XR) 500 MG 24 hr tablet TAKE 2 TABLETS BY MOUTH EVERY DAY WITH BREAKFAST  . Multiple Vitamin (MULTIVITAMIN) tablet Take 1 tablet by mouth daily.  . nitroGLYCERIN (NITROSTAT) 0.4 MG SL tablet Place 1 tablet (0.4 mg total) under the  tongue every 5 (five) minutes as needed for chest pain.  Glory Rosebush VERIO test strip TEST EVERY DAY  . pantoprazole (PROTONIX) 40 MG tablet TAKE 1 TABLET BY MOUTH EVERY DAY  . rosuvastatin (CRESTOR) 5 MG tablet Take 1 tablet (5 mg total) by mouth 3 (three) times a week.  Marland Kitchen tiZANidine (ZANAFLEX) 2 MG  tablet Take 1 tablet (2 mg total) by mouth every 8 (eight) hours as needed for muscle spasms.  . varenicline (CHANTIX) 0.5 MG tablet Take 1 tablet (0.5 mg total) by mouth 2 (two) times daily.  Marland Kitchen VITAMIN A PO Take 1 tablet by mouth every other day.  . vitamin E 100 UNIT capsule Take 100 Units by mouth daily.  . Zinc 50 MG TABS Take 50 mg by mouth daily.   No facility-administered encounter medications on file as of 12/01/2018.     Activities of Daily Living In your present state of health, do you have any difficulty performing the following activities: 05/25/2018  Hearing? N  Vision? N  Difficulty concentrating or making decisions? N  Walking or climbing stairs? N  Dressing or bathing? N  Doing errands, shopping? N  Some recent data might be hidden    Patient Care Team: Isaac Bliss, Rayford Halsted, MD as PCP - General (Internal Medicine) Verl Blalock, Marijo Conception, MD (Inactive) (Cardiology)   Assessment:   This is a routine wellness examination for Damean. Physical assessment deferred to PCP.   Exercise Activities and Dietary recommendations   Diet (meal preparation, eat out, water intake, caffeinated beverages, dairy products, fruits and vegetables): {Desc; diets:16563}      Goals   None     Fall Risk Fall Risk  11/20/2018 05/25/2018  Falls in the past year? 0 No  Number falls in past yr: 0 -  Injury with Fall? 0 -    Depression Screen PHQ 2/9 Scores 11/20/2018 11/20/2018 05/25/2018  PHQ - 2 Score 0 0 0  PHQ- 9 Score 0 - -    Cognitive Function        Immunization History  Administered Date(s) Administered  . Influenza Split 10/25/2011, 08/28/2012  . Influenza Whole 09/01/2008    . Influenza, High Dose Seasonal PF 11/20/2018  . Influenza,inj,Quad PF,6+ Mos 09/27/2013, 10/14/2014, 10/18/2015, 11/19/2016, 11/21/2017  . Pneumococcal Conjugate-13 05/20/2017  . Pneumococcal Polysaccharide-23 09/27/2013, 11/20/2018  . Td 05/31/1998  . Tdap 10/14/2014  . Zoster 10/25/2011  . Zoster Recombinat (Shingrix) 05/20/2017    Qualifies for Shingles Vaccine?   Screening Tests Health Maintenance  Topic Date Due  . OPHTHALMOLOGY EXAM  11/11/2018  . HEMOGLOBIN A1C  05/21/2019  . FOOT EXAM  05/26/2019  . COLONOSCOPY  04/11/2022  . TETANUS/TDAP  10/14/2024  . INFLUENZA VACCINE  Completed  . Hepatitis C Screening  Completed  . PNA vac Low Risk Adult  Completed      Plan:     I have personally reviewed and noted the following in the patient's chart:   . Medical and social history . Use of alcohol, tobacco or illicit drugs  . Current medications and supplements . Functional ability and status . Nutritional status . Physical activity . Advanced directives . List of other physicians . Hospitalizations, surgeries, and ER visits in previous 12 months . Vitals . Screenings to include cognitive, depression, and falls . Referrals and appointments  In addition, I have reviewed and discussed with patient certain preventive protocols, quality metrics, and best practice recommendations. A written personalized care plan for preventive services as well as general preventive health recommendations were provided to patient.     Alphia Moh, RN  11/27/2018

## 2018-11-27 NOTE — Telephone Encounter (Signed)
Author phoned pt. to notify about testosterone test order, and to offer lab appointment prior to AWV scheduled for 1/21. No answer. Author left detailed VM and requested pt. Call to schedule lab appointment.

## 2018-12-01 ENCOUNTER — Ambulatory Visit: Payer: Medicare Other

## 2018-12-01 ENCOUNTER — Other Ambulatory Visit (INDEPENDENT_AMBULATORY_CARE_PROVIDER_SITE_OTHER): Payer: Medicare Other

## 2018-12-01 DIAGNOSIS — E291 Testicular hypofunction: Secondary | ICD-10-CM | POA: Diagnosis not present

## 2018-12-02 LAB — TESTOSTERONE TOTAL,FREE,BIO, MALES
Albumin: 4.3 g/dL (ref 3.6–5.1)
Sex Hormone Binding: 80 nmol/L — ABNORMAL HIGH (ref 22–77)
Testosterone, Bioavailable: 54.2 ng/dL — ABNORMAL LOW (ref >=110.0)
Testosterone, Free: 27.5 pg/mL — ABNORMAL LOW (ref 46.0–224.0)
Testosterone: 456 ng/dL (ref 250–827)

## 2018-12-02 NOTE — Progress Notes (Addendum)
Subjective:   [name] [name] is a 68 y.o. male who presents for Medicare Annual/Subsequent preventive examination.  Review of Systems:  No ROS.  Medicare Wellness Visit. Additional risk factors are reflected in the social history.  Cardiac Risk Factors include: advanced age (>54mn, >>57women);male gender;hypertension;smoking/ tobacco exposure;sedentary lifestyle;dyslipidemia;diabetes mellitus  Sleep patterns: no sleep issues and feels rested on waking.   Home Safety/Smoke Alarms: Feels safe in home. Smoke alarms in place.   Living environment; residence and Firearm Safety: 1-story house/ trailer. No DME use or need at this time. Seat Belt Safety/Bike Helmet: Wears seat belt.  Male:   CCS- 07/2010. Per report, follow up in 5 years. Order to be placed for PCP review.     PSA-  Lab Results  Component Value Date   PSA 0.56 11/21/2017   PSA 0.44 11/19/2016   PSA 0.37 10/18/2015   Reviewed testosterone results. Per Dr. HJerilee Hoh follow-up appointment needed for testosterone treatment options, and pt. Made aware. Message to be sent to PCP to advise on need for PHY vs. OV.    Objective:    Vitals: BP (!) 160/80 (BP Location: Left Arm, Patient Position: Sitting, Cuff Size: Normal)   Pulse (!) 57   Resp 16   Ht 5' 8"  (1.727 m)   Wt 199 lb (90.3 kg)   SpO2 94%   BMI 30.26 kg/m   Body mass index is 30.26 kg/m.  Advanced Directives 12/03/2018 05/20/2017 10/18/2015 05/06/2014 10/28/2012 10/19/2012  Does Patient Have a Medical Advance Directive? Yes No No Patient has advance directive, copy not in chart Patient has advance directive, copy not in chart -  Type of [place]ive [place] - - Living will;Healthcare Power of [place] -  Does patient want to make changes to medical advance directive? No - Patient declined - - - - -  Copy of [place]? No - copy requested - - Copy requested  from family - -  Pre-existing out of facility DNR order (yellow form or pink MOST form) - - - - - No    Tobacco Social History   Tobacco Use  Smoking Status Current Every Day Smoker  . Packs/day: 1.00  . Years: 48.00  . Pack years: 48.00  . Types: Cigarettes  Smokeless Tobacco Never Used  Tobacco Comment   states he wants to reduce his dependence.     Ready to quit: No Counseling given: Not Answered Comment: states he wants to reduce his dependence.   Past Medical History:  Diagnosis Date  . ANEMIA-IRON DEFICIENCY   . Arthritis   . BACK PAIN, LUMBAR   . Chronic kidney disease    kidney stone  . CORONARY ARTERY DISEASE   . DIVERTICULOSIS, COLON   . DM    on no medication ?prediabetes  . Elevated LFTs 11/18/2015  . GERD   . Hepatitis    States he was tested for Hep B and he had antibodies  . HYPERLIPIDEMIA   . HYPERTENSION    dr tom wall  . PONV (postoperative nausea and vomiting)    little nausea after having his hip replacement  . Unspecified disorder of urethra and urinary tract    Past Surgical History:  Procedure Laterality Date  . COLONOSCOPY    . CORONARY ANGIOPLASTY WITH STENT PLACEMENT     06    dr t. wall  . ELECTROCARDIOGRAM  03/30/2007  . ESOPHAGOGASTRODUODENOSCOPY  11/07/2005  .  kidney stones    . LUMBAR LAMINECTOMY Right 05/18/2014   Procedure: LUMBAR LAMINECTOMY Microdiscectomy Right Lumbar five - sacral one;  Surgeon: [name], MD;  Location: [facility];  Service: Orthopedics;  Laterality: Right;  . Stress Cardiolite  11/30/2004  . TOTAL HIP ARTHROPLASTY  10/28/2012   Procedure: TOTAL HIP ARTHROPLASTY;  Surgeon: [name], MD;  Location: [facility];  Service: Orthopedics;  Laterality: Right;   Family History  Problem Relation Age of Onset  . Colon cancer Mother        Colon Cancer-at advanced age  . Heart disease Father   . Breast cancer Sister    Social History   Socioeconomic History  . Marital status: Single    Spouse name: Not on file    . Number of children: 0  . Years of education: Not on file  . Highest education level: Not on file  Occupational History  . 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 Occupation: Retired  Scientific laboratory technician  . Financial resource strain: Not hard at all  . Food insecurity:    Worry: Never true    Inability: Never true  . Transportation needs:    Medical: No    Non-medical: No  Tobacco Use  . Smoking status: Current Every Day Smoker    Packs/day: 1.00    Years: 48.00    Pack years: 48.00    Types: Cigarettes  . Smokeless tobacco: Never Used  . Tobacco comment: states he wants to reduce his dependence.  Substance and Sexual Activity  . Alcohol use: Yes    Alcohol/week: 0.0 standard drinks    Comment: rare  . Drug use: No  . Sexual activity: Not Currently  Lifestyle  . Physical activity:    Days per week: 0 days    Minutes per session: 0 min  . Stress: Not at all  Relationships  . Social connections:    Talks on phone: Once a week    Gets together: Never    Attends religious service: Never    Active member of club or organization: No    Attends meetings of clubs or organizations: Never    Relationship status: Never married  Other Topics Concern  . Not on file  Social History Narrative   Originally from [place], [place]   Lives alone with dog, has a couple of friends he can rely on for support.     Outpatient Encounter Medications as of 12/03/2018  Medication Sig  . Ascorbic Acid (VITAMIN C) 1000 MG tablet Take 2,000 mg by mouth daily.  Marland Kitchen aspirin EC 81 MG tablet Take 1 tablet (81 mg total) by mouth daily.  Marland Kitchen atenolol (TENORMIN) 25 MG tablet TAKE 1 TABLET BY MOUTH EVERY DAY  . b complex vitamins tablet Take 1 tablet by mouth daily.  . canagliflozin (INVOKANA) 300 MG TABS tablet Take 1 tablet (300 mg total) by mouth daily.  . Cholecalciferol (VITAMIN D) 2000 UNITS tablet Take 2,000 Units by mouth daily.  . clomiPHENE (CLOMID)  50 MG tablet Take 1qd  . Coenzyme Q10 (CO Q 10) 100 MG CAPS Take 100 mg by mouth daily.   . Cyanocobalamin (VITAMIN B-12 PO) Take 1 tablet by mouth daily.  . DHA-EPA-VIT B6-B12-FOLIC ACID PO Take 1 tablet by mouth daily.   . fluticasone (FLONASE) 50 MCG/ACT nasal spray Place 2 sprays into both nostrils daily.  Marland Kitchen FOLIC ACID PO Take 1  tablet by mouth daily.  . Ginkgo Biloba (GINKOBA PO) Take 1 tablet by mouth daily.  Marland Kitchen lisinopril (PRINIVIL,ZESTRIL) 40 MG tablet Take 1 tablet (40 mg total) by mouth daily.  . Magnesium 400 MG CAPS Take 400 mg by mouth daily.   . metFORMIN (GLUCOPHAGE-XR) 500 MG 24 hr tablet TAKE 2 TABLETS BY MOUTH EVERY DAY WITH BREAKFAST  . Multiple Vitamin (MULTIVITAMIN) tablet Take 1 tablet by mouth daily.  . nitroGLYCERIN (NITROSTAT) 0.4 MG SL tablet Place 1 tablet (0.4 mg total) under the tongue every 5 (five) minutes as needed for chest pain.  Glory Rosebush VERIO test strip TEST EVERY DAY  . pantoprazole (PROTONIX) 40 MG tablet TAKE 1 TABLET BY MOUTH EVERY DAY  . rosuvastatin (CRESTOR) 5 MG tablet Take 1 tablet (5 mg total) by mouth 3 (three) times a week.  Marland Kitchen tiZANidine (ZANAFLEX) 2 MG tablet Take 1 tablet (2 mg total) by mouth every 8 (eight) hours as needed for muscle spasms.  . varenicline (CHANTIX) 0.5 MG tablet Take 1 tablet (0.5 mg total) by mouth 2 (two) times daily.  Marland Kitchen VITAMIN A PO Take 1 tablet by mouth every other day.  . vitamin E 100 UNIT capsule Take 100 Units by mouth daily.  . Zinc 50 MG TABS Take 50 mg by mouth daily.   No facility-administered encounter medications on file as of 12/03/2018.     Activities of Daily Living In your present state of health, do you have any difficulty performing the following activities: 12/03/2018 05/25/2018  Hearing? N N  Vision? N N  Difficulty concentrating or making decisions? N N  Walking or climbing stairs? N N  Dressing or bathing? N N  Doing errands, shopping? N N  Preparing Food and eating ? N -  Using the Toilet? N  -  In the past six months, have you accidently leaked urine? N -  Do you have problems with loss of bowel control? N -  Managing your Medications? N -  Managing your Finances? N -  Housekeeping or managing your Housekeeping? N -  Some recent data might be hidden    Patient Care Team: Isaac Bliss, Rayford Halsted, MD as PCP - General (Internal Medicine) Verl Blalock, Marijo Conception, MD (Inactive) (Cardiology)   Assessment:   This is a routine wellness examination for Gavyn. Physical assessment deferred to PCP.   Exercise Activities and Dietary recommendations Current Exercise Habits: The patient does not participate in regular exercise at present(encouraged increased walking with dog), Exercise limited by: respiratory conditions(s);cardiac condition(s)  Diet (meal preparation, eat out, water intake, caffeinated beverages, dairy products, fruits and vegetables): in general, a "healthy" diet  . Drinks 3 cups of coffee/day, states appetite is good.     Goals    . Patient Stated     Reduce cigarette use for transition to senior apartments.        Fall Risk Fall Risk  12/03/2018 11/20/2018 05/25/2018  Falls in the past year? 0 0 No  Number falls in past yr: - 0 -  Injury with Fall? - 0 -    Depression Screen PHQ 2/9 Scores 12/03/2018 11/20/2018 11/20/2018 05/25/2018  PHQ - 2 Score 2 0 0 0  PHQ- 9 Score 2 0 - -    Cognitive Function       Ad8 score reviewed for issues:  Issues making decisions: no  Less interest in hobbies / activities: no  Repeats questions, stories (family complaining): no  Trouble using ordinary gadgets (microwave, computer,  phone):no  Forgets the month or year: no  Mismanaging finances: no  Remembering appts: no  Daily problems with thinking and/or memory: no Ad8 score is= 0  Immunization History  Administered Date(s) Administered  . Influenza Split 10/25/2011, 08/28/2012  . Influenza Whole 09/01/2008  . Influenza, High Dose Seasonal PF 11/20/2018  .  Influenza,inj,Quad PF,6+ Mos 09/27/2013, 10/14/2014, 10/18/2015, 11/19/2016, 11/21/2017  . Pneumococcal Conjugate-13 05/20/2017  . Pneumococcal Polysaccharide-23 09/27/2013, 11/20/2018  . Td 05/31/1998  . Tdap 10/14/2014  . Zoster 10/25/2011  . Zoster Recombinat (Shingrix) 05/20/2017    Qualifies for Shingles Vaccine? Yes, advised to check with local pharmacy regarding efficacy of second shingrix, as pt.received first dose in 2018. If still indicated, advised to receive shingrix from pharmacist.   Screening Tests Health Maintenance  Topic Date Due  . OPHTHALMOLOGY EXAM  11/11/2018  . HEMOGLOBIN A1C  05/21/2019  . FOOT EXAM  05/26/2019  . COLONOSCOPY  04/11/2022  . TETANUS/TDAP  10/14/2024  . INFLUENZA VACCINE  Completed  . Hepatitis C Screening  Completed  . PNA vac Low Risk Adult  Completed        Plan:    Will follow up with Dr. Jerilee Hoh about colonoscopy timeframe, and need for routine physical labs. Will be in touch about scheduling appointment to follow-up on testosterone treatment options.  Bring a copy of your living will and/or healthcare power of attorney to your next office visit.  Continue staying active walking your dog!  Refer to smoking cessation resources provided ([place] quitline). Every little bit helps in reducing nicotine exposure! Helps your BP to prevent stroke.   I have personally reviewed and noted the following in the patient's chart:   . Medical and social history . Use of alcohol, tobacco or illicit drugs  . Current medications and supplements . Functional ability and status . Nutritional status . Physical activity . Advanced directives . List of other physicians . Vitals . Screenings to include cognitive, depression, and falls . Referrals and appointments   In addition, I have reviewed and discussed with patient certain preventive protocols, quality metrics, and best practice recommendations. A written personalized care plan for preventive  services as well as general preventive health recommendations were provided to patient.     Alphia Moh, RN  12/03/2018   Medical screening examination/treatment was performed by qualified clinical staff member and, as supervising physician, I was immediately available for consultation/collaboration. I have reviewed documentation and agree with assessment and plan.  Lelon Frohlich, MD

## 2018-12-03 ENCOUNTER — Telehealth: Payer: Self-pay

## 2018-12-03 ENCOUNTER — Ambulatory Visit (INDEPENDENT_AMBULATORY_CARE_PROVIDER_SITE_OTHER): Payer: Medicare Other

## 2018-12-03 VITALS — BP 160/80 | HR 57 | Resp 16 | Ht 68.0 in | Wt 199.0 lb

## 2018-12-03 DIAGNOSIS — Z0000 Encounter for general adult medical examination without abnormal findings: Secondary | ICD-10-CM

## 2018-12-03 NOTE — Telephone Encounter (Signed)
During AWV, pt. wanted to know if he could schedule his annual physical ASAP, during which he could discuss his low testosterone treatment options and get his routine labwork. Pt. was told he could not by front end staff for insurance coverage purposes. In reviewing chart, last 'wellness exam' was with former PCP Dr. Loanne Drilling on 11/21/2017, but last full set of labs was ordered by cardiologist on 02/02/18. Routed to billing/coding to clarify appropriateness of physical vs. Office visit for [facility] appointment.   Pt. also would like clarification on recommended timing for repeat colonoscopy. Per last report from Dr. [name] in 2011, would need in 5 years, but pt. thought he was told to return in ten years. Routed to PCP to advise.  Lastly, pt. needs his annual low dose CT lung scan, which had been ordered by pulmonologist on 01/07/18, but pt. thought he also needed a referral to pulmonology again in order to get scan completed. Routed to pulmonologist to clarify.

## 2018-12-03 NOTE — Telephone Encounter (Signed)
Ok to schedule for CPE

## 2018-12-03 NOTE — Patient Instructions (Addendum)
Will follow up with Dr. Jerilee Hoh about colonoscopy timeframe, and need for routine physical labs. Will be in touch about scheduling appointment to follow-up on testosterone treatment options.  Bring a copy of your living will and/or healthcare power of attorney to your next office visit.  Continue staying active walking your dog!  Refer to smoking cessation resources provided ([facility] quitline). Every little bit helps in reducing nicotine exposure! Helps your BP to prevent stroke.    [name] , Thank you for taking time to come for your Medicare Wellness Visit. I appreciate your ongoing commitment to your health goals. Please review the following plan we discussed and let me know if I can assist you in the future.   These are the goals we discussed: Goals    . Patient Stated     Reduce cigarette use for transition to senior apartments.        This is a list of the screening recommended for you and due dates:  Health Maintenance  Topic Date Due  . Eye exam for diabetics  11/11/2018  . Hemoglobin A1C  05/21/2019  . Complete foot exam   05/26/2019  . Colon Cancer Screening  04/11/2022  . Tetanus Vaccine  10/14/2024  . Flu Shot  Completed  .  Hepatitis C: One time screening is recommended by Center for Disease Control  (CDC) for  adults born from [birth year] through 1964.   Completed  . Pneumonia vaccines  Completed         Health Maintenance, Male A healthy lifestyle and preventive care is important for your health and wellness. Ask your health care provider about what schedule of regular examinations is right for you. What should I know about weight and diet? Eat a Healthy Diet  Eat plenty of vegetables, fruits, whole grains, low-fat dairy products, and lean protein.  Do not eat a lot of foods high in solid fats, added sugars, or salt.  Maintain a Healthy Weight Regular exercise can help you achieve or maintain a healthy weight. You should:  Do at least 150 minutes of exercise  each week. The exercise should increase your heart rate and make you sweat (moderate-intensity exercise).  Do strength-training exercises at least twice a week. Watch Your Levels of Cholesterol and Blood Lipids  Have your blood tested for lipids and cholesterol every 5 years starting at 67 years of age. If you are at high risk for heart disease, you should start having your blood tested when you are 67 years old. You may need to have your cholesterol levels checked more often if: ? Your lipid or cholesterol levels are high. ? You are older than 67 years of age. ? You are at high risk for heart disease. What should I know about cancer screening? Many types of cancers can be detected early and may often be prevented. Lung Cancer  You should be screened every year for lung cancer if: ? You are a current smoker who has smoked for at least 30 years. ? You are a former smoker who has quit within the past 15 years.  Talk to your health care provider about your screening options, when you should start screening, and how often you should be screened. Colorectal Cancer  Routine colorectal cancer screening usually begins at 67 years of age and should be repeated every 5-10 years until you are 67 years old. You may need to be screened more often if early forms of precancerous polyps or small growths are found. Your  health care provider may recommend screening at an earlier age if you have risk factors for colon cancer.  Your health care provider may recommend using home test kits to check for hidden blood in the stool.  A small camera at the end of a tube can be used to examine your colon (sigmoidoscopy or colonoscopy). This checks for the earliest forms of colorectal cancer. Prostate and Testicular Cancer  Depending on your age and overall health, your health care provider may do certain tests to screen for prostate and testicular cancer.  Talk to your health care provider about any symptoms or  concerns you have about testicular or prostate cancer. Skin Cancer  Check your skin from head to toe regularly.  Tell your health care provider about any new moles or changes in moles, especially if: ? There is a change in a mole's size, shape, or color. ? You have a mole that is larger than a pencil eraser.  Always use sunscreen. Apply sunscreen liberally and repeat throughout the day.  Protect yourself by wearing long sleeves, pants, a wide-brimmed hat, and sunglasses when outside. What should I know about heart disease, diabetes, and high blood pressure?  If you are 71-31 years of age, have your blood pressure checked every 3-5 years. If you are 18 years of age or older, have your blood pressure checked every year. You should have your blood pressure measured twice-once when you are at a hospital or clinic, and once when you are not at a hospital or clinic. Record the average of the two measurements. To check your blood pressure when you are not at a hospital or clinic, you can use: ? An automated blood pressure machine at a pharmacy. ? A home blood pressure monitor.  Talk to your health care provider about your target blood pressure.  If you are between 56-37 years old, ask your health care provider if you should take aspirin to prevent heart disease.  Have regular diabetes screenings by checking your fasting blood sugar level. ? If you are at a normal weight and have a low risk for diabetes, have this test once every three years after the age of 1. ? If you are overweight and have a high risk for diabetes, consider being tested at a younger age or more often.  A one-time screening for abdominal aortic aneurysm (AAA) by ultrasound is recommended for men aged 68-75 years who are current or former smokers. What should I know about preventing infection? Hepatitis B If you have a higher risk for hepatitis B, you should be screened for this virus. Talk with your health care provider to  find out if you are at risk for hepatitis B infection. Hepatitis C Blood testing is recommended for:  Everyone born from [birth year] through 1964.  Anyone with known risk factors for hepatitis C. Sexually Transmitted Diseases (STDs)  You should be screened each year for STDs including gonorrhea and chlamydia if: ? You are sexually active and are younger than 67 years of age. ? You are older than 67 years of age and your health care provider tells you that you are at risk for this type of infection. ? Your sexual activity has changed since you were last screened and you are at an increased risk for chlamydia or gonorrhea. Ask your health care provider if you are at risk.  Talk with your health care provider about whether you are at high risk of being infected with HIV. Your health care provider may  recommend a prescription medicine to help prevent HIV infection. What else can I do?  Schedule regular health, dental, and eye exams.  Stay current with your vaccines (immunizations).  Do not use any tobacco products, such as cigarettes, chewing tobacco, and e-cigarettes. If you need help quitting, ask your health care provider.  Limit alcohol intake to no more than 2 drinks per day. One drink equals 12 ounces of beer, 5 ounces of wine, or 1 ounces of hard liquor.  Do not use street drugs.  Do not share needles.  Ask your health care provider for help if you need support or information about quitting drugs.  Tell your health care provider if you often feel depressed.  Tell your health care provider if you have ever been abused or do not feel safe at home. This information is not intended to replace advice given to you by your health care provider. Make sure you discuss any questions you have with your health care provider. Document Released: 04/25/2008 Document Revised: 06/26/2016 Document Reviewed: 08/01/2015 Elsevier Interactive Patient Education  2019 [employer].

## 2018-12-03 NOTE — Telephone Encounter (Signed)
Hi,   He is able to have a CPE since he has [employer] and his last one was in 11/2017.  It would be up to provider , if she wants to also address his chronic conditions at this time or at a separate appointment if too much to address in the same time block as CPE.  Hope this answers your questions. Please let me know if further questions.  Thank you,  DAwn

## 2018-12-04 NOTE — Telephone Encounter (Signed)
Author phoned pt. To schedule CPE. Appointment rescheduled for 2/5 at 1130AM.

## 2018-12-16 ENCOUNTER — Encounter: Payer: Self-pay | Admitting: Internal Medicine

## 2018-12-16 ENCOUNTER — Other Ambulatory Visit: Payer: Self-pay | Admitting: *Deleted

## 2018-12-16 ENCOUNTER — Ambulatory Visit (INDEPENDENT_AMBULATORY_CARE_PROVIDER_SITE_OTHER): Payer: Medicare Other | Admitting: Internal Medicine

## 2018-12-16 VITALS — BP 120/78 | HR 59 | Temp 98.2°F | Ht 67.5 in | Wt 198.9 lb

## 2018-12-16 DIAGNOSIS — R5383 Other fatigue: Secondary | ICD-10-CM

## 2018-12-16 DIAGNOSIS — I2510 Atherosclerotic heart disease of native coronary artery without angina pectoris: Secondary | ICD-10-CM

## 2018-12-16 DIAGNOSIS — H6121 Impacted cerumen, right ear: Secondary | ICD-10-CM

## 2018-12-16 DIAGNOSIS — Z0001 Encounter for general adult medical examination with abnormal findings: Secondary | ICD-10-CM

## 2018-12-16 DIAGNOSIS — E119 Type 2 diabetes mellitus without complications: Secondary | ICD-10-CM

## 2018-12-16 DIAGNOSIS — I10 Essential (primary) hypertension: Secondary | ICD-10-CM | POA: Diagnosis not present

## 2018-12-16 DIAGNOSIS — E785 Hyperlipidemia, unspecified: Secondary | ICD-10-CM

## 2018-12-16 DIAGNOSIS — F17200 Nicotine dependence, unspecified, uncomplicated: Secondary | ICD-10-CM

## 2018-12-16 DIAGNOSIS — Z122 Encounter for screening for malignant neoplasm of respiratory organs: Secondary | ICD-10-CM

## 2018-12-16 DIAGNOSIS — R7989 Other specified abnormal findings of blood chemistry: Secondary | ICD-10-CM | POA: Diagnosis not present

## 2018-12-16 DIAGNOSIS — E291 Testicular hypofunction: Secondary | ICD-10-CM

## 2018-12-16 LAB — COMPREHENSIVE METABOLIC PANEL
ALT: 36 U/L (ref 0–53)
AST: 28 U/L (ref 0–37)
Albumin: 4.7 g/dL (ref 3.5–5.2)
Alkaline Phosphatase: 53 U/L (ref 39–117)
BUN: 17 mg/dL (ref 6–23)
CO2: 26 mEq/L (ref 19–32)
Calcium: 9.6 mg/dL (ref 8.4–10.5)
Chloride: 107 mEq/L (ref 96–112)
Creatinine, Ser: 0.82 mg/dL (ref 0.40–1.50)
GFR: 93.8 mL/min (ref >60.00)
Glucose, Bld: 109 mg/dL — ABNORMAL HIGH (ref 70–99)
Potassium: 4.9 mEq/L (ref 3.5–5.1)
Sodium: 143 mEq/L (ref 135–145)
Total Bilirubin: 0.4 mg/dL (ref 0.2–1.2)
Total Protein: 7.2 g/dL (ref 6.0–8.3)

## 2018-12-16 LAB — CBC WITH DIFFERENTIAL/PLATELET
Basophils Absolute: 0.1 10*3/uL (ref 0.0–0.1)
Basophils Relative: 1.3 % (ref 0.0–3.0)
Eosinophils Absolute: 0.1 10*3/uL (ref 0.0–0.7)
Eosinophils Relative: 1.7 % (ref 0.0–5.0)
HCT: 50 % (ref 39.0–52.0)
Hemoglobin: 16.8 g/dL (ref 13.0–17.0)
Lymphocytes Relative: 23.2 % (ref 12.0–46.0)
Lymphs Abs: 1.5 10*3/uL (ref 0.7–4.0)
MCHC: 33.6 g/dL (ref 30.0–36.0)
MCV: 97.4 fl (ref 78.0–100.0)
Monocytes Absolute: 0.6 10*3/uL (ref 0.1–1.0)
Monocytes Relative: 8.6 % (ref 3.0–12.0)
Neutro Abs: 4.3 10*3/uL (ref 1.4–7.7)
Neutrophils Relative %: 65.2 % (ref 43.0–77.0)
Platelets: 166 10*3/uL (ref 150.0–400.0)
RBC: 5.13 Mil/uL (ref 4.22–5.81)
RDW: 14.1 % (ref 11.5–15.5)
WBC: 6.6 10*3/uL (ref 4.0–10.5)

## 2018-12-16 LAB — LIPID PANEL
Cholesterol: 203 mg/dL — ABNORMAL HIGH (ref 0–200)
HDL: 51 mg/dL (ref >39.00)
NonHDL: 152.25
Total CHOL/HDL Ratio: 4
Triglycerides: 249 mg/dL — ABNORMAL HIGH (ref 0.0–149.0)
VLDL: 49.8 mg/dL — ABNORMAL HIGH (ref 0.0–40.0)

## 2018-12-16 LAB — HEMOGLOBIN A1C: Hgb A1c MFr Bld: 6.3 % (ref 4.6–6.5)

## 2018-12-16 LAB — VITAMIN B12: Vitamin B-12: 470 pg/mL (ref 211–911)

## 2018-12-16 LAB — TSH: TSH: 2.2 u[IU]/mL (ref 0.35–4.50)

## 2018-12-16 LAB — LDL CHOLESTEROL, DIRECT: Direct LDL: 125 mg/dL

## 2018-12-16 NOTE — Addendum Note (Signed)
Addended by: Gwynne Edinger on: 12/16/2018 12:48 PM   Modules accepted: Orders

## 2018-12-16 NOTE — Patient Instructions (Addendum)
Great seeing you today.  Instructions: -lab work today. We will notify you when results are available -I have scheduled your low-dose CT scan for lung cancer screening. -will notify you about starting testosterone after I receive your results -schedule follow up with me in 3 months   Preventive Care 65 Years and Older, Male Preventive care refers to lifestyle choices and visits with your health care provider that can promote health and wellness. What does preventive care include?   A yearly physical exam. This is also called an annual well check.  Dental exams once or twice a year.  Routine eye exams. Ask your health care provider how often you should have your eyes checked.  Personal lifestyle choices, including: ? Daily care of your teeth and gums. ? Regular physical activity. ? Eating a healthy diet. ? Avoiding tobacco and drug use. ? Limiting alcohol use. ? Practicing safe sex. ? Taking low doses of aspirin every day. ? Taking vitamin and mineral supplements as recommended by your health care provider. What happens during an annual well check? The services and screenings done by your health care provider during your annual well check will depend on your age, overall health, lifestyle risk factors, and family history of disease. Counseling Your health care provider may ask you questions about your:  Alcohol use.  Tobacco use.  Drug use.  Emotional well-being.  Home and relationship well-being.  Sexual activity.  Eating habits.  History of falls.  Memory and ability to understand (cognition).  Work and work Statistician. Screening You may have the following tests or measurements:  Height, weight, and BMI.  Blood pressure.  Lipid and cholesterol levels. These may be checked every 5 years, or more frequently if you are over 67 years old.  Skin check.  Lung cancer screening. You may have this screening every year starting at age 50 if you have a  30-pack-year history of smoking and currently smoke or have quit within the past 15 years.  Colorectal cancer screening. All adults should have this screening starting at age 44 and continuing until age 38. You will have tests every 1-10 years, depending on your results and the type of screening test. People at increased risk should start screening at an earlier age. Screening tests may include: ? Guaiac-based fecal occult blood testing. ? Fecal immunochemical test (FIT). ? Stool DNA test. ? Virtual colonoscopy. ? Sigmoidoscopy. During this test, a flexible tube with a tiny camera (sigmoidoscope) is used to examine your rectum and lower colon. The sigmoidoscope is inserted through your anus into your rectum and lower colon. ? Colonoscopy. During this test, a long, thin, flexible tube with a tiny camera (colonoscope) is used to examine your entire colon and rectum.  Prostate cancer screening. Recommendations will vary depending on your family history and other risks.  Hepatitis C blood test.  Hepatitis B blood test.  Sexually transmitted disease (STD) testing.  Diabetes screening. This is done by checking your blood sugar (glucose) after you have not eaten for a while (fasting). You may have this done every 1-3 years.  Abdominal aortic aneurysm (AAA) screening. You may need this if you are a current or former smoker.  Osteoporosis. You may be screened starting at age 50 if you are at high risk. Talk with your health care provider about your test results, treatment options, and if necessary, the need for more tests. Vaccines Your health care provider may recommend certain vaccines, such as:  Influenza vaccine. This is recommended every  year.  Tetanus, diphtheria, and acellular pertussis (Tdap, Td) vaccine. You may need a Td booster every 10 years.  Varicella vaccine. You may need this if you have not been vaccinated.  Zoster vaccine. You may need this after age 11.  Measles, mumps,  and rubella (MMR) vaccine. You may need at least one dose of MMR if you were born in 1957 or later. You may also need a second dose.  Pneumococcal 13-valent conjugate (PCV13) vaccine. One dose is recommended after age 21.  Pneumococcal polysaccharide (PPSV23) vaccine. One dose is recommended after age 5.  Meningococcal vaccine. You may need this if you have certain conditions.  Hepatitis A vaccine. You may need this if you have certain conditions or if you travel or work in places where you may be exposed to hepatitis A.  Hepatitis B vaccine. You may need this if you have certain conditions or if you travel or work in places where you may be exposed to hepatitis B.  Haemophilus influenzae type b (Hib) vaccine. You may need this if you have certain risk factors. Talk to your health care provider about which screenings and vaccines you need and how often you need them. This information is not intended to replace advice given to you by your health care provider. Make sure you discuss any questions you have with your health care provider. Document Released: 11/24/2015 Document Revised: 12/18/2017 Document Reviewed: 08/29/2015 Elsevier Interactive Patient Education  2019 [employer].

## 2018-12-16 NOTE — Progress Notes (Signed)
Established Patient Office Visit     CC/Reason for Visit: preventive exam and annual medicare wellness visit  HPI: [name] [name] is a 68 y.o. male who is coming in today for the above mentioned reasons. Past Medical History is significant for: Coronary artery disease. Follows with cardiology routinely.  He received coronary stenting after a positive stress test in 2006; he remains very active and asymptomatic.  Denies anginal chest discomfort.  Has a history of benign essential hypertension that has been well controlled, hyperlipidemia on a statin, continues to smoke 1 PPD and is not interested in smoking cessation at the moment, was prescribed chantix by his previous PCP but he never filled the prescription, also has type 2 diabetes with a most recent A1c of 6.0. Also has a h/o testosterone deficiency and is interested in replacement.  He is interested in scheduling his annual low-dose CT scan for lung cancer screening.  This was last performed in February 2019 and recommendations were to rescreen in 12 months.  He has routine eye and dental care.  He is up-to-date on immunizations, is interested in shingles vaccination series after discussion today and will have this at the pharmacy.  He has no acute complaints today.  He is still smoking 1 pack a day.   Past Medical/Surgical History: Past Medical History:  Diagnosis Date  . ANEMIA-IRON DEFICIENCY   . Arthritis   . BACK PAIN, LUMBAR   . Chronic kidney disease    kidney stone  . CORONARY ARTERY DISEASE   . DIVERTICULOSIS, COLON   . DM    on no medication ?prediabetes  . Elevated LFTs 11/18/2015  . GERD   . Hepatitis    States he was tested for Hep B and he had antibodies  . HYPERLIPIDEMIA   . HYPERTENSION    dr tom wall  . PONV (postoperative nausea and vomiting)    little nausea after having his hip replacement  . Unspecified disorder of urethra and urinary tract     Past Surgical History:  Procedure Laterality Date   . COLONOSCOPY    . CORONARY ANGIOPLASTY WITH STENT PLACEMENT     06    dr t. wall  . ELECTROCARDIOGRAM  03/30/2007  . ESOPHAGOGASTRODUODENOSCOPY  11/07/2005  . kidney stones    . LUMBAR LAMINECTOMY Right 05/18/2014   Procedure: LUMBAR LAMINECTOMY Microdiscectomy Right Lumbar five - sacral one;  Surgeon: [name], MD;  Location: [facility];  Service: Orthopedics;  Laterality: Right;  . Stress Cardiolite  11/30/2004  . TOTAL HIP ARTHROPLASTY  10/28/2012   Procedure: TOTAL HIP ARTHROPLASTY;  Surgeon: [name], MD;  Location: [place];  Service: Orthopedics;  Laterality: Right;    Social History:  reports that he has been smoking cigarettes. He has a 48.00 pack-year smoking history. He has never used smokeless tobacco. He reports current alcohol use. He reports that he does not use drugs.  Allergies: Allergies  Allergen Reactions  . Actos [Pioglitazone Hydrochloride]     Weight gain    Family History:  Family History  Problem Relation Age of Onset  . Colon cancer Mother        Colon Cancer-at advanced age  . Heart disease Father   . Breast cancer Sister      Current Outpatient Medications:  .  Ascorbic Acid (VITAMIN C) 1000 MG tablet, Take 2,000 mg by mouth daily., Disp: , Rfl:  .  aspirin EC 81 MG tablet, Take 1 tablet (81  mg total) by mouth daily., Disp: 30 tablet, Rfl: 11 .  atenolol (TENORMIN) 25 MG tablet, TAKE 1 TABLET BY MOUTH EVERY DAY, Disp: 90 tablet, Rfl: 2 .  b complex vitamins tablet, Take 1 tablet by mouth daily., Disp: , Rfl:  .  canagliflozin (INVOKANA) 300 MG TABS tablet, Take 1 tablet (300 mg total) by mouth daily., Disp: 90 tablet, Rfl: 3 .  Cholecalciferol (VITAMIN D) 2000 UNITS tablet, Take 2,000 Units by mouth daily., Disp: , Rfl:  .  clomiPHENE (CLOMID) 50 MG tablet, Take 1qd, Disp: 90 tablet, Rfl: 3 .  Coenzyme Q10 (CO Q 10) 100 MG CAPS, Take 100 mg by mouth daily. , Disp: , Rfl:  .  Cyanocobalamin (VITAMIN B-12 PO), Take 1 tablet by mouth daily., Disp:  , Rfl:  .  DHA-EPA-VIT B6-B12-FOLIC ACID PO, Take 1 tablet by mouth daily. , Disp: , Rfl:  .  fluticasone (FLONASE) 50 MCG/ACT nasal spray, Place 2 sprays into both nostrils daily., Disp: 16 g, Rfl: 6 .  FOLIC ACID PO, Take 1 tablet by mouth daily., Disp: , Rfl:  .  Ginkgo Biloba (GINKOBA PO), Take 1 tablet by mouth daily., Disp: , Rfl:  .  lisinopril (PRINIVIL,ZESTRIL) 40 MG tablet, Take 1 tablet (40 mg total) by mouth daily., Disp: 90 tablet, Rfl: 3 .  Magnesium 400 MG CAPS, Take 400 mg by mouth daily. , Disp: , Rfl:  .  metFORMIN (GLUCOPHAGE-XR) 500 MG 24 hr tablet, TAKE 2 TABLETS BY MOUTH EVERY DAY WITH BREAKFAST, Disp: 180 tablet, Rfl: 3 .  Multiple Vitamin (MULTIVITAMIN) tablet, Take 1 tablet by mouth daily., Disp: , Rfl:  .  nitroGLYCERIN (NITROSTAT) 0.4 MG SL tablet, Place 1 tablet (0.4 mg total) under the tongue every 5 (five) minutes as needed for chest pain., Disp: 30 tablet, Rfl: 11 .  ONETOUCH VERIO test strip, TEST EVERY DAY, Disp: 25 each, Rfl: 4 .  pantoprazole (PROTONIX) 40 MG tablet, TAKE 1 TABLET BY MOUTH EVERY DAY, Disp: 30 tablet, Rfl: 2 .  rosuvastatin (CRESTOR) 5 MG tablet, Take 1 tablet (5 mg total) by mouth 3 (three) times a week., Disp: 36 tablet, Rfl: 3 .  tiZANidine (ZANAFLEX) 2 MG tablet, Take 1 tablet (2 mg total) by mouth every 8 (eight) hours as needed for muscle spasms., Disp: 30 tablet, Rfl: 0 .  varenicline (CHANTIX) 0.5 MG tablet, Take 1 tablet (0.5 mg total) by mouth 2 (two) times daily., Disp: 60 tablet, Rfl: 11 .  VITAMIN A PO, Take 1 tablet by mouth every other day., Disp: , Rfl:  .  vitamin E 100 UNIT capsule, Take 100 Units by mouth daily., Disp: , Rfl:  .  Zinc 50 MG TABS, Take 50 mg by mouth daily., Disp: , Rfl:   Review of Systems:  Constitutional: Denies fever, chills, diaphoresis, appetite change and fatigue.  HEENT: Denies photophobia, eye pain, redness, hearing loss, ear pain, congestion, sore throat, rhinorrhea, sneezing, mouth sores, trouble  swallowing, neck pain, neck stiffness and tinnitus.   Respiratory: Denies SOB, DOE, cough, chest tightness,  and wheezing.   Cardiovascular: Denies chest pain, palpitations and leg swelling.  Gastrointestinal: Denies nausea, vomiting, abdominal pain, diarrhea, constipation, blood in stool and abdominal distention.  Genitourinary: Denies dysuria, urgency, frequency, hematuria, flank pain and difficulty urinating.  Endocrine: Denies: hot or cold intolerance, sweats, changes in hair or nails, polyuria, polydipsia. Musculoskeletal: Denies myalgias, back pain, joint swelling, arthralgias and gait problem.  Skin: Denies pallor, rash and wound.  Neurological: Denies  dizziness, seizures, syncope, weakness, light-headedness, numbness and headaches.  Hematological: Denies adenopathy. Easy bruising, personal or family bleeding history  Psychiatric/Behavioral: Denies suicidal ideation, mood changes, confusion, nervousness, sleep disturbance and agitation   Subsequent Medicare wellness visit   1. Risk factors, based on past  M,S,F -cardiovascular disease risk factors include history of diabetes, hypertension, hyperlipidemia   2.  Physical activities: Not very physically active   3.  Depression/mood:  He feels fatigued and depressed, believes related to testosterone deficiency   4.  Hearing:  Decreased hearing on the right   5.  ADL's: Independent in ADLs   6.  Fall risk:  Low risk   7.  Home safety: No problems identified   8.  Height weight, and visual acuity: Height and weight as documented in chart, sees eye care professional annually for visual acuity   9.  Counseling:  Counseled on continuing to modify his cardiovascular disease risk factors, lifestyle modifications and follow-up for lung cancer screening given his ongoing smoking history   10. Lab orders based on risk factors: Laboratory update will be reviewed   11. Referral :  None today   12. Care plan:  Lab work, consider initiating  testosterone replacement based on results   13. Cognitive assessment:  No cognitive impairment   14. Screening: Patient provided with a written and personalized 5-10 year screening schedule in the AVS.   yes   15. Provider List Update:   Primary care, cardiology    Physical Exam: Vitals:   12/16/18 1128  BP: 120/78  Pulse: (!) 59  Temp: 98.2 F (36.8 C)  TempSrc: Oral  SpO2: 96%  Weight: 198 lb 14.4 oz (90.2 kg)  Height: 5' 7.5" (1.715 m)    Body mass index is 30.69 kg/m.   Constitutional: NAD, calm, comfortable Eyes: PERRL, lids and conjunctivae normal ENMT: Mucous membranes are moist. Posterior pharynx clear of any exudate or lesions. Normal dentition. Tympanic membrane is pearly white, no erythema or bulging on the left, the right is obstructed by impacted cerumen Neck: normal, supple, no masses, no thyromegaly Respiratory: clear to auscultation bilaterally, no wheezing, no crackles. Normal respiratory effort. No accessory muscle use.  Cardiovascular: Regular rate and rhythm, no murmurs / rubs / gallops. No extremity edema. 2+ pedal pulses. No carotid bruits.  Abdomen: no tenderness, no masses palpated. No hepatosplenomegaly. Bowel sounds positive.  Musculoskeletal: no clubbing / cyanosis. No joint deformity upper and lower extremities. Good ROM, no contractures. Normal muscle tone.  Skin: no rashes, lesions, ulcers. No induration Neurologic: CN 2-12 grossly intact. Sensation intact, DTR normal. Strength 5/5 in all 4.  Psychiatric: Normal judgment and insight. Alert and oriented x 3. Normal mood.    Impression and Plan:  Essential hypertension  -Well-controlled, will continue current medication.  Atherosclerosis of coronary artery without angina pectoris, unspecified vessel or lesion type, unspecified whether native or transplanted heart -Continue to follow with cardiology as scheduled.  Type 2 diabetes mellitus without complication, without long-term current use of  insulin (HCC)  -A1c recently was 6.0. -Plan to continue metformin, continue lifestyle modifications.  Dyslipidemia -Last LDL was 108 in March 2019, not at goal. -He is on rosuvastatin 5 mg. -We will recheck lipids today.  Low testosterone -Confirmed on 2 separate measurements. -We will check blood work today including PSA as patient is interested in starting testosterone supplementation.  Smoker -Counseled on smoking cessation. -He is due for his low-dose CT scan for lung cancer screening per radiology recommendations. -He already  has this scheduled.  Impacted cerumen of right ear -Cerumen Desimpaction: Warm water was applied and gentle ear lavage performed on right ear. There were no complications and following the desimpaction the tympanic membranes were visible. Tympanic membranes are intact following the procedure. Auditory canals are normal. The patient reported relief of symptoms after removal of cerumen.   Fatigue, unspecified type  -Patient believes this is related to his testosterone deficiency would like to initiate replacement. -We will also check TSH, vitamin B12, CBC and vitamin D levels as potential etiologies.     Patient Instructions  Doristine Devoid seeing you today.  Instructions: -lab work today. We will notify you when results are available -I have scheduled your low-dose CT scan for lung cancer screening. -will notify you about starting testosterone after I receive your results -schedule follow up with me in 3 months     [place], MD [facility]

## 2018-12-16 NOTE — Addendum Note (Signed)
Addended by: Gwynne Edinger on: 12/16/2018 12:46 PM   Modules accepted: Orders

## 2018-12-17 ENCOUNTER — Other Ambulatory Visit: Payer: Self-pay | Admitting: Internal Medicine

## 2018-12-17 DIAGNOSIS — E785 Hyperlipidemia, unspecified: Secondary | ICD-10-CM

## 2018-12-17 LAB — VITAMIN D 25 HYDROXY (VIT D DEFICIENCY, FRACTURES): VITD: 43.86 ng/mL (ref 30.00–100.00)

## 2018-12-17 LAB — PSA: PSA: 0.38 ng/mL (ref 0.10–4.00)

## 2018-12-17 MED ORDER — ROSUVASTATIN CALCIUM 5 MG PO TABS: 5.0000 mg | ORAL_TABLET | Freq: Every day | ORAL | 3 refills | Status: DC

## 2018-12-18 ENCOUNTER — Other Ambulatory Visit: Payer: Self-pay | Admitting: Internal Medicine

## 2018-12-18 DIAGNOSIS — R7989 Other specified abnormal findings of blood chemistry: Secondary | ICD-10-CM

## 2018-12-18 MED ORDER — TESTOSTERONE 25 MG/2.5GM (1%) TD GEL: 25.0000 mg | Freq: Every morning | TRANSDERMAL | 3 refills | Status: DC

## 2018-12-23 ENCOUNTER — Other Ambulatory Visit: Payer: Self-pay | Admitting: Internal Medicine

## 2018-12-23 DIAGNOSIS — R7989 Other specified abnormal findings of blood chemistry: Secondary | ICD-10-CM

## 2018-12-30 ENCOUNTER — Telehealth: Payer: Self-pay | Admitting: *Deleted

## 2018-12-30 DIAGNOSIS — E119 Type 2 diabetes mellitus without complications: Secondary | ICD-10-CM

## 2018-12-30 DIAGNOSIS — I10 Essential (primary) hypertension: Secondary | ICD-10-CM

## 2018-12-30 DIAGNOSIS — I2510 Atherosclerotic heart disease of native coronary artery without angina pectoris: Secondary | ICD-10-CM

## 2018-12-30 NOTE — Telephone Encounter (Signed)
Copied from [place] [phone]. Topic: Referral - Request for Referral >> Dec 30, 2018 10:40 AM Windy Kalata wrote: Has patient seen PCP for this complaint? Yes *If NO, is insurance requiring patient see PCP for this issue before PCP can refer them? Referral for which specialty: Endocronology Preferred provider/office: Dr. Loanne Drilling Reason for referral: Diabetic

## 2018-12-30 NOTE — Telephone Encounter (Signed)
Copied from [place] [phone]. Topic: Referral - Request for Referral >> Dec 30, 2018 10:39 AM Windy Kalata wrote: Has patient seen PCP for this complaint? No *If NO, is insurance requiring patient see PCP for this issue before PCP can refer them? Referral for which specialty: Cardiologist Preferred provider/office: [name] at [facility], he has an appt scheduled for 02/11/2019 Reason for referral: Annual checkup

## 2018-12-30 NOTE — Telephone Encounter (Signed)
Referral placed.

## 2018-12-31 ENCOUNTER — Ambulatory Visit
Admission: RE | Admit: 2018-12-31 | Discharge: 2018-12-31 | Disposition: A | Discharge status: Home / Self Care | Payer: Medicare Other | Source: Ambulatory Visit | Attending: Acute Care | Admitting: Acute Care

## 2018-12-31 DIAGNOSIS — F17210 Nicotine dependence, cigarettes, uncomplicated: Secondary | ICD-10-CM

## 2018-12-31 DIAGNOSIS — Z122 Encounter for screening for malignant neoplasm of respiratory organs: Secondary | ICD-10-CM

## 2019-01-11 ENCOUNTER — Other Ambulatory Visit: Payer: Self-pay | Admitting: Acute Care

## 2019-01-11 DIAGNOSIS — F17210 Nicotine dependence, cigarettes, uncomplicated: Principal | ICD-10-CM

## 2019-01-11 DIAGNOSIS — Z122 Encounter for screening for malignant neoplasm of respiratory organs: Secondary | ICD-10-CM

## 2019-01-11 DIAGNOSIS — Z87891 Personal history of nicotine dependence: Secondary | ICD-10-CM

## 2019-01-23 ENCOUNTER — Other Ambulatory Visit: Payer: Self-pay | Admitting: Cardiology

## 2019-01-23 DIAGNOSIS — I10 Essential (primary) hypertension: Secondary | ICD-10-CM

## 2019-01-23 DIAGNOSIS — E785 Hyperlipidemia, unspecified: Secondary | ICD-10-CM

## 2019-02-01 ENCOUNTER — Telehealth: Payer: Self-pay | Admitting: Internal Medicine

## 2019-02-01 MED ORDER — PANTOPRAZOLE SODIUM 40 MG PO TBEC: 40.0000 mg | DELAYED_RELEASE_TABLET | Freq: Every day | ORAL | 2 refills | Status: DC

## 2019-02-01 NOTE — Telephone Encounter (Signed)
Pantoprazole refilled as requested.

## 2019-02-03 ENCOUNTER — Other Ambulatory Visit: Payer: Self-pay | Admitting: Cardiology

## 2019-02-03 DIAGNOSIS — E785 Hyperlipidemia, unspecified: Secondary | ICD-10-CM

## 2019-02-04 ENCOUNTER — Encounter: Payer: Medicare Other | Admitting: Internal Medicine

## 2019-02-05 ENCOUNTER — Telehealth: Payer: Self-pay | Admitting: Cardiology

## 2019-02-05 ENCOUNTER — Other Ambulatory Visit: Payer: Self-pay

## 2019-02-05 NOTE — Telephone Encounter (Signed)
Given the fact that he has evidence of coronary artery calcification and significantly elevated lipids I agree with increasing statin dose and careful monitoring of his LFTs.  Avoid other medications such as Tylenol and any alcohol.

## 2019-02-05 NOTE — Telephone Encounter (Signed)
This would be an annual visit for this patient who has been stable for a while, we can postpone his visit for over 12 weeks unless he has new or worsening symptoms.  [name], MD

## 2019-02-05 NOTE — Telephone Encounter (Addendum)
   Primary Cardiologist:  [name]  Patient contacted.  History reviewed.  No symptoms to suggest any unstable cardiac conditions.  Based on discussion, with current pandemic situation, we will be postponing this appointment for [name] [name] with a plan for f/u in 12 wks  if feasible/necessary.  If symptoms change, he has been instructed to contact our office.   Routing to C19 CANCEL pool for tracking (P CV DIV CV19 CANCEL - reason for visit "other.") and assigning priority (1 = 4-6 wks, 2 = 6-12 wks, 3 = >12 wks).   Nuala Alpha, LPN  02/17/1447 1:85 PM    Pt states he is good on refills at this time.   Pt states he does want Dr [name] to take a look at his recent CT Chest Lung in [place], which showed some concerns with his heart and stated it showed cirrhosis of the liver.  Pt also stated he had labs done recently by his new PCP, and his liver enzymes were good, so she increased his statin to taking it everyday, vs what Dr [name] advised on him taking in every other day.  Pt wants to know is it safe to take it every day, based on the findings of his recent CT showing cirrhosis.  Pt states he does not drink now, but did heavily drink back back in the 80s and 90s.  Informed the pt that I will route this information to Dr [name] to review and advise on.     Marland Kitchen

## 2019-02-05 NOTE — Telephone Encounter (Signed)
Informed the pt that Dr [name] reviewed his CT, and provided her recommendations, as indicated in this note.  Pt states he is good on refills at this time.

## 2019-02-08 ENCOUNTER — Encounter: Payer: Self-pay | Admitting: Endocrinology

## 2019-02-08 ENCOUNTER — Other Ambulatory Visit: Payer: Self-pay

## 2019-02-08 ENCOUNTER — Telehealth: Payer: Self-pay | Admitting: Internal Medicine

## 2019-02-08 ENCOUNTER — Ambulatory Visit (INDEPENDENT_AMBULATORY_CARE_PROVIDER_SITE_OTHER): Payer: Medicare Other | Admitting: Endocrinology

## 2019-02-08 VITALS — BP 140/90 | HR 55 | Temp 98.8°F | Ht 67.5 in | Wt 207.8 lb

## 2019-02-08 DIAGNOSIS — E1159 Type 2 diabetes mellitus with other circulatory complications: Secondary | ICD-10-CM | POA: Diagnosis not present

## 2019-02-08 DIAGNOSIS — I10 Essential (primary) hypertension: Secondary | ICD-10-CM

## 2019-02-08 DIAGNOSIS — E119 Type 2 diabetes mellitus without complications: Secondary | ICD-10-CM

## 2019-02-08 LAB — POCT GLYCOSYLATED HEMOGLOBIN (HGB A1C)
HbA1c POC (<> result, manual entry): 6.3 % (ref 4.0–5.6)
Hemoglobin A1C: 6.3 % — AB (ref 4.0–5.6)

## 2019-02-08 MED ORDER — CANAGLIFLOZIN 100 MG PO TABS: 100.0000 mg | ORAL_TABLET | Freq: Every day | ORAL | 3 refills | Status: DC

## 2019-02-08 MED ORDER — GLUCOSE BLOOD VI STRP: 1.0000 | ORAL_STRIP | Freq: Every day | 3 refills | Status: DC

## 2019-02-08 NOTE — Telephone Encounter (Signed)
Patient had a CT in late Feb that showed cirrhosis.  He was asked to schedule a non-urgent office visit with Dr. [name] to discuss.  He is set up for 04/08/19

## 2019-02-08 NOTE — Patient Instructions (Signed)
Please continue the same medications for diabetes.   check your blood sugar once a day.  vary the time of day when you check, between before the 3 meals, and at bedtime.  also check if you have symptoms of your blood sugar being too high or too low.  please keep a record of the readings and bring it to your next appointment here (or you can bring the meter itself).  You can write it on any piece of paper.  please call us sooner if your blood sugar goes below 70, or if you have a lot of readings over 200. Please come back for a follow-up appointment in 6 months.

## 2019-02-08 NOTE — Progress Notes (Signed)
Subjective:    Patient ID: [name] [name], male    DOB: 1951/07/10, 68 y.o.   MRN: [id number]  HPI Pt returns for f/u of DM. DM type: 2 Dx'ed: 9024 Complications: CAD Therapy: 2 oral meds DKA: never Severe hypoglycemia: never.  Pancreatitis: never Other: he has never been on insulin; pioglitizone was favored due to NASH, but he did not tolerate (weight gain).   Interval history: he says cbg's are well-controlled.  pt states he feels well in general.  He takes invokana, at 100 mg qd.  Past Medical History:  Diagnosis Date  . ANEMIA-IRON DEFICIENCY   . Arthritis   . BACK PAIN, LUMBAR   . Chronic kidney disease    kidney stone  . CORONARY ARTERY DISEASE   . DIVERTICULOSIS, COLON   . DM    on no medication ?prediabetes  . Elevated LFTs 11/18/2015  . GERD   . Hepatitis    States he was tested for Hep B and he had antibodies  . HYPERLIPIDEMIA   . HYPERTENSION    dr tom wall  . PONV (postoperative nausea and vomiting)    little nausea after having his hip replacement  . Unspecified disorder of urethra and urinary tract     Past Surgical History:  Procedure Laterality Date  . COLONOSCOPY    . CORONARY ANGIOPLASTY WITH STENT PLACEMENT     06    dr t. wall  . ELECTROCARDIOGRAM  03/30/2007  . ESOPHAGOGASTRODUODENOSCOPY  11/07/2005  . kidney stones    . LUMBAR LAMINECTOMY Right 05/18/2014   Procedure: LUMBAR LAMINECTOMY Microdiscectomy Right Lumbar five - sacral one;  Surgeon: [name], MD;  Location: [place];  Service: Orthopedics;  Laterality: Right;  . Stress Cardiolite  11/30/2004  . TOTAL HIP ARTHROPLASTY  10/28/2012   Procedure: TOTAL HIP ARTHROPLASTY;  Surgeon: [name], MD;  Location: [place];  Service: Orthopedics;  Laterality: Right;    Social History   Socioeconomic History  . Marital status: Single    Spouse name: Not on file  . Number of children: 0  . Years of education: Not on file  . Highest education level: Not on file  Occupational History  .  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 Occupation: Retired  Scientific laboratory technician  . Financial resource strain: Not hard at all  . Food insecurity:    Worry: Never true    Inability: Never true  . Transportation needs:    Medical: No    Non-medical: No  Tobacco Use  . Smoking status: Current Every Day Smoker    Packs/day: 1.00    Years: 48.00    Pack years: 48.00    Types: Cigarettes  . Smokeless tobacco: Never Used  . Tobacco comment: states he wants to reduce his dependence.  Substance and Sexual Activity  . Alcohol use: Yes    Alcohol/week: 0.0 standard drinks    Comment: rare  . Drug use: No  . Sexual activity: Not Currently  Lifestyle  . Physical activity:    Days per week: 0 days    Minutes per session: 0 min  . Stress: Not at all  Relationships  . Social connections:    Talks on phone: Once a week    Gets together: Never    Attends religious service: Never    Active member of club or organization: No    Attends meetings of clubs or organizations:  Never    Relationship status: Never married  . Intimate partner violence:    Fear of current or ex partner: Not on file    Emotionally abused: Not on file    Physically abused: Not on file    Forced sexual activity: Not on file  Other Topics Concern  . Not on file  Social History Narrative   Originally from [place], [place]   Lives alone with dog, has a couple of friends he can rely on for support.     Current Outpatient Medications on File Prior to Visit  Medication Sig Dispense Refill  . Ascorbic Acid (VITAMIN C) 1000 MG tablet Take 2,000 mg by mouth daily.    Marland Kitchen aspirin EC 81 MG tablet Take 1 tablet (81 mg total) by mouth daily. 30 tablet 11  . atenolol (TENORMIN) 25 MG tablet TAKE 1 TABLET BY MOUTH EVERY DAY 90 tablet 2  . b complex vitamins tablet Take 1 tablet by mouth daily.    . Cholecalciferol (VITAMIN D) 2000 UNITS tablet Take 2,000 Units by mouth daily.    .  Coenzyme Q10 (CO Q 10) 100 MG CAPS Take 100 mg by mouth daily.     . Cyanocobalamin (VITAMIN B-12 PO) Take 1 tablet by mouth daily.    . DHA-EPA-VIT B6-B12-FOLIC ACID PO Take 1 tablet by mouth daily.     . fluticasone (FLONASE) 50 MCG/ACT nasal spray Place 2 sprays into both nostrils daily. 16 g 6  . FOLIC ACID PO Take 1 tablet by mouth daily.    . Ginkgo Biloba (GINKOBA PO) Take 1 tablet by mouth daily.    Marland Kitchen lisinopril (PRINIVIL,ZESTRIL) 40 MG tablet TAKE 1 TABLET BY MOUTH EVERY DAY 90 tablet 1  . Magnesium 400 MG CAPS Take 400 mg by mouth daily.     . metFORMIN (GLUCOPHAGE-XR) 500 MG 24 hr tablet TAKE 2 TABLETS BY MOUTH EVERY DAY WITH BREAKFAST 180 tablet 3  . Multiple Vitamin (MULTIVITAMIN) tablet Take 1 tablet by mouth daily.    . nitroGLYCERIN (NITROSTAT) 0.4 MG SL tablet Place 1 tablet (0.4 mg total) under the tongue every 5 (five) minutes as needed for chest pain. 30 tablet 11  . pantoprazole (PROTONIX) 40 MG tablet Take 1 tablet (40 mg total) by mouth daily. 30 tablet 2  . rosuvastatin (CRESTOR) 5 MG tablet Take 1 tablet (5 mg total) by mouth daily at 6 PM. 90 tablet 3  . Testosterone 25 MG/2.5GM (1%) GEL Place 25 mg onto the skin every morning. 30 Package 3  . tiZANidine (ZANAFLEX) 2 MG tablet Take 1 tablet (2 mg total) by mouth every 8 (eight) hours as needed for muscle spasms. 30 tablet 0  . varenicline (CHANTIX) 0.5 MG tablet Take 1 tablet (0.5 mg total) by mouth 2 (two) times daily. 60 tablet 11  . VITAMIN A PO Take 1 tablet by mouth every other day.    . vitamin E 100 UNIT capsule Take 100 Units by mouth daily.    . Zinc 50 MG TABS Take 50 mg by mouth daily.     No current facility-administered medications on file prior to visit.     Allergies  Allergen Reactions  . Actos [Pioglitazone Hydrochloride]     Weight gain    Family History  Problem Relation Age of Onset  . Colon cancer Mother        Colon Cancer-at advanced age  . Heart disease Father   . Breast cancer  Sister  BP 140/90   Pulse (!) 55   Temp 98.8 F (37.1 C) (Oral)   Ht 5' 7.5" (1.715 m)   Wt 207 lb 12.8 oz (94.3 kg)   SpO2 93%   BMI 32.07 kg/m    Review of Systems He denies hypoglycemia.      Objective:   Physical Exam VITAL SIGNS:  See vs page.   GENERAL: no distress.   Pulses: dorsalis pedis intact bilat.   MSK: no deformity of the feet.   CV: no leg edema.   Skin:  no ulcer on the feet.  normal color and temp on the feet.   Neuro: sensation is intact to touch on the feet.    Lab Results  Component Value Date   HGBA1C 6.3 (A) 02/08/2019   HGBA1C 6.3 02/08/2019       Assessment & Plan:  Type 2 DM, with CAD: well-controlled CAD: in this context, he should take meds which do not cause hypoglycemia.   HTN: recheck next time.   Patient Instructions  Please continue the same medications for diabetes.   check your blood sugar once a day.  vary the time of day when you check, between before the 3 meals, and at bedtime.  also check if you have symptoms of your blood sugar being too high or too low.  please keep a record of the readings and bring it to your next appointment here (or you can bring the meter itself).  You can write it on any piece of paper.  please call us sooner if your blood sugar goes below 70, or if you have a lot of readings over 200. Please come back for a follow-up appointment in 6 months.

## 2019-02-08 NOTE — Telephone Encounter (Signed)
Pt called in wanting to speak with the nurse about a ct scan that was done in [place]. He stated that he was advised that some cirrhosis of the liver was reported and he is wanting dr.gessner to look at the scan agin.

## 2019-02-11 ENCOUNTER — Ambulatory Visit: Payer: Medicare Other | Admitting: Cardiology

## 2019-02-19 ENCOUNTER — Other Ambulatory Visit: Payer: Self-pay | Admitting: Endocrinology

## 2019-02-19 NOTE — Telephone Encounter (Signed)
Please forward refill request to pt's new primary care provider.  

## 2019-02-22 ENCOUNTER — Other Ambulatory Visit: Payer: Self-pay | Admitting: Cardiology

## 2019-02-22 ENCOUNTER — Other Ambulatory Visit (INDEPENDENT_AMBULATORY_CARE_PROVIDER_SITE_OTHER): Payer: Medicare Other

## 2019-02-22 ENCOUNTER — Other Ambulatory Visit: Payer: Self-pay

## 2019-02-22 DIAGNOSIS — R7989 Other specified abnormal findings of blood chemistry: Secondary | ICD-10-CM

## 2019-02-22 MED ORDER — ATENOLOL 25 MG PO TABS: 25.0000 mg | ORAL_TABLET | Freq: Every day | ORAL | 2 refills | Status: DC

## 2019-02-22 NOTE — Telephone Encounter (Signed)
Pt calling stating that Dr. Loanne Drilling is unable to refill his atenolol and would like [name] to start refill this medication for him, sent to [facility]. Please address

## 2019-02-24 LAB — TESTOSTERONE, FREE: TESTOSTERONE FREE: 41.5 pg/mL — ABNORMAL LOW (ref 46.0–224.0)

## 2019-02-26 ENCOUNTER — Encounter: Payer: Self-pay | Admitting: *Deleted

## 2019-02-26 NOTE — Telephone Encounter (Signed)
Call placed to pt to reschedule his cxld appt in March due to Covid 19. Left a message for pt to call back. Message also sent via mychart,

## 2019-03-01 ENCOUNTER — Telehealth: Payer: Self-pay | Admitting: Cardiology

## 2019-03-01 NOTE — Telephone Encounter (Signed)
Follow Up: ° ° ° ° ° °Returning your call from Friday. °

## 2019-03-01 NOTE — Telephone Encounter (Signed)
Returned pts call.  Pt is not interested in doing a virtual visit. He would rather see Dr [name] in person.  Pt was explained that it would be August or so before that can be done. I will put pt on a recall list for August and pt aware that will send Dr. Francesca Oman nurse, Karlene Einstein a message. Pt thankful for the call.

## 2019-03-03 ENCOUNTER — Encounter: Payer: Self-pay | Admitting: Internal Medicine

## 2019-03-25 ENCOUNTER — Ambulatory Visit: Payer: Medicare Other | Admitting: Internal Medicine

## 2019-04-08 ENCOUNTER — Telehealth: Payer: Self-pay | Admitting: General Surgery

## 2019-04-08 NOTE — Telephone Encounter (Signed)
Covid-19 travel screening questions  Have you traveled in the last 14 days? NO If yes where?  Do you now or have you had a fever in the last 14 days? NO  Do you have any respiratory symptoms of shortness of breath or cough now or in the last 14 days? NO  Do you have any family members or close contacts with diagnosed or suspected Covid-19? NO        Patient instructed he will need to wear a mask to the clinic if he has one, he stated he does have a mask. Also told to come in to the clinic alone. Patient verbalized understanding.

## 2019-04-09 ENCOUNTER — Other Ambulatory Visit (INDEPENDENT_AMBULATORY_CARE_PROVIDER_SITE_OTHER): Payer: Medicare Other

## 2019-04-09 ENCOUNTER — Other Ambulatory Visit: Payer: Self-pay | Admitting: Internal Medicine

## 2019-04-09 ENCOUNTER — Encounter: Payer: Self-pay | Admitting: Internal Medicine

## 2019-04-09 ENCOUNTER — Ambulatory Visit: Payer: Medicare Other | Admitting: Internal Medicine

## 2019-04-09 ENCOUNTER — Telehealth: Payer: Self-pay | Admitting: General Surgery

## 2019-04-09 ENCOUNTER — Ambulatory Visit (INDEPENDENT_AMBULATORY_CARE_PROVIDER_SITE_OTHER): Payer: Medicare Other | Admitting: Internal Medicine

## 2019-04-09 VITALS — BP 128/84 | HR 63 | Temp 98.3°F | Ht 67.5 in | Wt 208.0 lb

## 2019-04-09 DIAGNOSIS — K760 Fatty (change of) liver, not elsewhere classified: Secondary | ICD-10-CM | POA: Diagnosis not present

## 2019-04-09 DIAGNOSIS — R932 Abnormal findings on diagnostic imaging of liver and biliary tract: Secondary | ICD-10-CM | POA: Diagnosis not present

## 2019-04-09 DIAGNOSIS — E669 Obesity, unspecified: Secondary | ICD-10-CM | POA: Diagnosis not present

## 2019-04-09 LAB — HEPATIC FUNCTION PANEL
ALT: 55 U/L — ABNORMAL HIGH (ref 0–53)
AST: 43 U/L — ABNORMAL HIGH (ref 0–37)
Albumin: 4.7 g/dL (ref 3.5–5.2)
Alkaline Phosphatase: 55 U/L (ref 39–117)
Bilirubin, Direct: 0.1 mg/dL (ref 0.0–0.3)
Total Bilirubin: 0.4 mg/dL (ref 0.2–1.2)
Total Protein: 7.8 g/dL (ref 6.0–8.3)

## 2019-04-09 NOTE — Progress Notes (Signed)
RICKEY SADOWSKI 68 y.o. 1951/09/10 [id number]  Assessment & Plan:   Encounter Diagnoses  Name Primary?  Marland Kitchen NAFLD (nonalcoholic fatty liver disease) Yes  . Abnormal CT of liver - ? cirrhosis   . Obesity (BMI 30.0-34.9)     It certainly possible he could have cirrhosis. Nonalcoholic fatty liver disease appears to be most likely as well given that he has metabolic syndrome. He is a child's a if he has cirrhosis. I am going to have him do liver Doppler ultrasound, since his platelets are normal unless the ultrasound shows signs of portal hypertension I am not sure we need to proceed with screening EGD but will consider that.  I have also decided to check an alpha-1 antitrypsin level and an ANA level.  Rechecking hepatitis C antibody and checking a hepatitis A antibody total also.  Further plans pending these results.  We did give him a handout about fatty liver and I explained that the only known and established treatment for that is weight loss.  I appreciate the opportunity to care for this patient. CC: Isaac Bliss, Rayford Halsted, MD  Subjective:   Chief Complaint: Question of cirrhosis on CT scan  HPI Mr. Fuson is a 68 year old white male, I have seen him for reflux and iron deficiency anemia in the past.  He recently had another CT the chest for lung cancer screening in February of this year with the following findings reported  "Cirrhosis, as evidenced by caudate enlargement and medial segment left liver lobe atrophy"  In the past he has had fatty liver described on these and he was given a diagnosis of Karlene Lineman by his previous primary care provider or diabetologist at any rate Dr. Loanne Drilling.  In 2012 he had negative hepatitis C antibody and a positive hepatitis B surface antibody with hepatitis B surface antigen being negative.  In 2013 an ultrasound of the abdomen demonstrated a diffusely echogenic liver consistent with fatty infiltration. LFT abnormalities off/on x yrs 1.5-2x  abnormal transaminases.  Sometimes his statins were adjusted relative to this.  More recently his LFTs were normal in February and his platelet count was normal that as well.  He does not drink significant amounts of alcohol though 20 or 30 years ago he said he was a heavy drinker.  There is no family history of liver disease.  No history of drug use.  Maintained on PPI for reflux.  Allergies  Allergen Reactions  . Actos [Pioglitazone Hydrochloride]     Weight gain    Current Outpatient Medications:  .  Ascorbic Acid (VITAMIN C) 1000 MG tablet, Take 2,000 mg by mouth daily., Disp: , Rfl:  .  aspirin EC 81 MG tablet, Take 1 tablet (81 mg total) by mouth daily., Disp: 30 tablet, Rfl: 11 .  atenolol (TENORMIN) 25 MG tablet, Take 1 tablet (25 mg total) by mouth daily., Disp: 90 tablet, Rfl: 2 .  b complex vitamins tablet, Take 1 tablet by mouth daily., Disp: , Rfl:  .  canagliflozin (INVOKANA) 100 MG TABS tablet, Take 1 tablet (100 mg total) by mouth daily before breakfast., Disp: 90 tablet, Rfl: 3 .  Cholecalciferol (VITAMIN D) 2000 UNITS tablet, Take 2,000 Units by mouth daily., Disp: , Rfl:  .  Coenzyme Q10 (CO Q 10) 100 MG CAPS, Take 100 mg by mouth daily. , Disp: , Rfl:  .  Cyanocobalamin (VITAMIN B-12 PO), Take 1 tablet by mouth daily., Disp: , Rfl:  .  DHA-EPA-VIT B6-B12-FOLIC ACID PO, Take  1 tablet by mouth daily. , Disp: , Rfl:  .  fluticasone (FLONASE) 50 MCG/ACT nasal spray, Place 2 sprays into both nostrils daily., Disp: 16 g, Rfl: 6 .  FOLIC ACID PO, Take 1 tablet by mouth daily., Disp: , Rfl:  .  Ginkgo Biloba (GINKOBA PO), Take 1 tablet by mouth daily., Disp: , Rfl:  .  glucose blood (ONETOUCH VERIO) test strip, 1 each by Other route daily. Use as instructed, Disp: 90 each, Rfl: 3 .  lisinopril (PRINIVIL,ZESTRIL) 40 MG tablet, TAKE 1 TABLET BY MOUTH EVERY DAY, Disp: 90 tablet, Rfl: 1 .  Magnesium 400 MG CAPS, Take 400 mg by mouth daily. , Disp: , Rfl:  .  metFORMIN  (GLUCOPHAGE-XR) 500 MG 24 hr tablet, TAKE 2 TABLETS BY MOUTH EVERY DAY WITH BREAKFAST, Disp: 180 tablet, Rfl: 3 .  Multiple Vitamin (MULTIVITAMIN) tablet, Take 1 tablet by mouth daily., Disp: , Rfl:  .  nitroGLYCERIN (NITROSTAT) 0.4 MG SL tablet, Place 1 tablet (0.4 mg total) under the tongue every 5 (five) minutes as needed for chest pain., Disp: 30 tablet, Rfl: 11 .  pantoprazole (PROTONIX) 40 MG tablet, Take 1 tablet (40 mg total) by mouth daily., Disp: 30 tablet, Rfl: 2 .  rosuvastatin (CRESTOR) 5 MG tablet, Take 1 tablet (5 mg total) by mouth daily at 6 PM., Disp: 90 tablet, Rfl: 3 .  Testosterone 25 MG/2.5GM (1%) GEL, Place 25 mg onto the skin every morning., Disp: 30 Package, Rfl: 3 .  tiZANidine (ZANAFLEX) 2 MG tablet, Take 1 tablet (2 mg total) by mouth every 8 (eight) hours as needed for muscle spasms., Disp: 30 tablet, Rfl: 0 .  VITAMIN A PO, Take 1 tablet by mouth every other day., Disp: , Rfl:  .  vitamin E 100 UNIT capsule, Take 100 Units by mouth daily., Disp: , Rfl:  .  Zinc 50 MG TABS, Take 50 mg by mouth daily., Disp: , Rfl:   Past Medical History:  Diagnosis Date  . ANEMIA-IRON DEFICIENCY   . Arthritis   . BACK PAIN, LUMBAR   . Chronic kidney disease    kidney stone  . CORONARY ARTERY DISEASE   . DIVERTICULOSIS, COLON   . DM    on no medication ?prediabetes  . Elevated LFTs 11/18/2015  . GERD   . Hepatitis    States he was tested for Hep B and he had antibodies  . HYPERLIPIDEMIA   . HYPERTENSION    dr tom wall  . PONV (postoperative nausea and vomiting)    little nausea after having his hip replacement  . Unspecified disorder of urethra and urinary tract    Past Surgical History:  Procedure Laterality Date  . COLONOSCOPY    . CORONARY ANGIOPLASTY WITH STENT PLACEMENT     06    dr t. wall  . ELECTROCARDIOGRAM  03/30/2007  . ESOPHAGOGASTRODUODENOSCOPY  11/07/2005  . kidney stones    . LUMBAR LAMINECTOMY Right 05/18/2014   Procedure: LUMBAR LAMINECTOMY  Microdiscectomy Right Lumbar five - sacral one;  Surgeon: [name], MD;  Location: [facility];  Service: Orthopedics;  Laterality: Right;  . Stress Cardiolite  11/30/2004  . TOTAL HIP ARTHROPLASTY  10/28/2012   Procedure: TOTAL HIP ARTHROPLASTY;  Surgeon: [name], MD;  Location: [facility];  Service: Orthopedics;  Laterality: Right;   Social History   Social History Narrative   Originally from [place], [place]   Lives alone with dog, has a couple of friends he can  rely on for support.    family history includes Breast cancer in his sister; Colon cancer in his mother; Heart disease in his father.   Review of Systems All the review of systems appear negative at this time  Objective:   Physical Exam @BP  128/84 (BP Location: Left Arm, Patient Position: Sitting, Cuff Size: Normal)   Pulse 63   Temp 98.3 F (36.8 C) (Oral)   Ht 5' 7.5" (1.715 m)   Wt 208 lb (94.3 kg)   BMI 32.10 kg/m @  General:  Well-developed, well-nourished and in no acute distress Eyes:  anicteric.  Neck:   supple w/o thyromegaly or mass.  Lungs: Clear to auscultation bilaterally. Heart:  S1S2, no rubs, murmurs, gallops. Abdomen:  soft, non-tender, no hepatosplenomegaly, hernia, or mass and BS+. obese Lymph:  no cervical or supraclavicular adenopathy. Extremities:   no edema, cyanosis or clubbing Skin   no rash. No CLD Neuro:  A&O x 3.  Psych:  appropriate mood and  Affect.   Data Reviewed: See HPI

## 2019-04-09 NOTE — Patient Instructions (Addendum)
Your provider has requested that you go to the basement level for lab work before leaving today. Press "B" on the elevator. The lab is located at the first door on the left as you exit the elevator. l    You have been scheduled for an liver doppler ultrasound at [facility]  On 04/22/2019  At 10:00AM . Please arrive 20 minutes prior to your appointment for registration. Make certain not to have anything to eat or drink after midnight. Should you need to reschedule your appointment, please contact radiology at [phone]. This test typically takes about 30 minutes to perform.  We are giving you a handout to read on fatty liver.    I appreciate the opportunity to care for you. Jerral Bonito

## 2019-04-09 NOTE — Telephone Encounter (Signed)
Called patient to see if he is on the way to his appointment. No answer, left message on voicemail.

## 2019-04-12 LAB — HEPATITIS A ANTIBODY, TOTAL: Hepatitis A AB,Total: REACTIVE — AB

## 2019-04-12 LAB — ANA: Anti Nuclear Antibody (ANA): NEGATIVE

## 2019-04-12 LAB — ALPHA-1-ANTITRYPSIN: A-1 Antitrypsin, Ser: 154 mg/dL (ref 83–199)

## 2019-04-12 LAB — HEPATITIS C ANTIBODY
Hepatitis C Ab: NONREACTIVE
SIGNAL TO CUT-OFF: 0.03 (ref <1.00)

## 2019-04-13 NOTE — Progress Notes (Signed)
[name],  Liver tests mildly abnormal again.  The other labs show you are immune to hepatitis A (have had in past) and do not have hepatitis C, no problems with alpha-1-antitrypsin deficiency or signs of autoimmune problems.  Will wait on the ultrasound and then discuss further.  I appreciate the opportunity to care for you. Gatha Mayer, MD, [facility]

## 2019-04-22 ENCOUNTER — Ambulatory Visit
Admission: RE | Admit: 2019-04-22 | Discharge: 2019-04-22 | Disposition: A | Discharge status: Home / Self Care | Payer: Medicare Other | Source: Ambulatory Visit | Attending: Internal Medicine | Admitting: Internal Medicine

## 2019-04-22 DIAGNOSIS — K760 Fatty (change of) liver, not elsewhere classified: Secondary | ICD-10-CM | POA: Diagnosis not present

## 2019-04-22 DIAGNOSIS — R932 Abnormal findings on diagnostic imaging of liver and biliary tract: Secondary | ICD-10-CM

## 2019-04-23 NOTE — Progress Notes (Signed)
[name] news from the ultrasound - this test does not suggest cirrhosis. You could still have some scarring and damage in the liver but at this point do not think you have cirrhosis.  You should have liver tests and a complete blood count every 6 months. Dr. Jerilee Hoh can do that - if we see very bad liver tests or low platelets then that would be a sign you need to see me again.  You have non-alcoholic fatty liver disease - it can lead to cirrhosis - no drugs to treat this but managing cholesterol and trying to lose weight can help.  You should not use alcohol more than 1-2 drinks/month in my opinion.  Let me know if questions. I have copied Dr. Jerilee Hoh.  Warm regards,  Gatha Mayer, MD, [facility]

## 2019-04-25 ENCOUNTER — Other Ambulatory Visit: Payer: Self-pay | Admitting: Internal Medicine

## 2019-04-25 DIAGNOSIS — R7989 Other specified abnormal findings of blood chemistry: Secondary | ICD-10-CM

## 2019-04-27 NOTE — Telephone Encounter (Signed)
Patient called to inquire about medication mentioned below states that he is all out and need a refill ASAP please.

## 2019-04-30 ENCOUNTER — Other Ambulatory Visit: Payer: Self-pay

## 2019-04-30 MED ORDER — PANTOPRAZOLE SODIUM 40 MG PO TBEC: 40.0000 mg | DELAYED_RELEASE_TABLET | Freq: Every day | ORAL | 11 refills | Status: DC

## 2019-06-30 ENCOUNTER — Other Ambulatory Visit: Payer: Self-pay | Admitting: Cardiology

## 2019-06-30 DIAGNOSIS — E785 Hyperlipidemia, unspecified: Secondary | ICD-10-CM

## 2019-06-30 DIAGNOSIS — I10 Essential (primary) hypertension: Secondary | ICD-10-CM

## 2019-07-26 DIAGNOSIS — E119 Type 2 diabetes mellitus without complications: Secondary | ICD-10-CM | POA: Diagnosis not present

## 2019-07-26 LAB — HM DIABETES EYE EXAM

## 2019-08-09 ENCOUNTER — Other Ambulatory Visit: Payer: Self-pay

## 2019-08-10 ENCOUNTER — Ambulatory Visit (INDEPENDENT_AMBULATORY_CARE_PROVIDER_SITE_OTHER): Payer: Medicare Other | Admitting: Endocrinology

## 2019-08-10 ENCOUNTER — Encounter: Payer: Self-pay | Admitting: Endocrinology

## 2019-08-10 VITALS — BP 144/90 | HR 70 | Ht 67.5 in | Wt 209.4 lb

## 2019-08-10 DIAGNOSIS — E119 Type 2 diabetes mellitus without complications: Secondary | ICD-10-CM | POA: Diagnosis not present

## 2019-08-10 DIAGNOSIS — Z23 Encounter for immunization: Secondary | ICD-10-CM | POA: Diagnosis not present

## 2019-08-10 LAB — POCT GLYCOSYLATED HEMOGLOBIN (HGB A1C): Hemoglobin A1C: 6.7 % — AB (ref 4.0–5.6)

## 2019-08-10 NOTE — Patient Instructions (Addendum)
Your blood pressure is high today.  Please see your primary care provider soon, to have it rechecked Please continue the same medications for diabetes.   check your blood sugar once a day.  vary the time of day when you check, between before the 3 meals, and at bedtime.  also check if you have symptoms of your blood sugar being too high or too low.  please keep a record of the readings and bring it to your next appointment here (or you can bring the meter itself).  You can write it on any piece of paper.  please call us sooner if your blood sugar goes below 70, or if you have a lot of readings over 200. Please come back for a follow-up appointment in 6 months.

## 2019-08-10 NOTE — Progress Notes (Signed)
Subjective:    Patient ID: [name] [name], male    DOB: 1950/12/10, 68 y.o.   MRN: [id number]  HPI Pt returns for f/u of DM. DM type: 2 Dx'ed: 7096 Complications: CAD Therapy: 2 oral meds DKA: never Severe hypoglycemia: never.  Pancreatitis: never Other: he has never been on insulin; pioglitizone was favored due to NASH, but he did not tolerate (weight gain).   Interval history: he says cbg's are well-controlled.  pt states he feels well in general.  He takes invokana, at 100 mg qd.   Past Medical History:  Diagnosis Date  . Anemia, iron deficiency 11/18/2008   Resolved, despite no rx--2012    . ANEMIA-IRON DEFICIENCY   . Arthritis   . BACK PAIN, LUMBAR   . CORONARY ARTERY DISEASE   . DIVERTICULOSIS, COLON   . DM    on no medication ?prediabetes  . Elevated LFTs 11/18/2015  . GERD   . Hepatitis    States he was tested for Hep B and he had antibodies  . HYPERLIPIDEMIA   . HYPERTENSION    dr tom wall  . Nephrolithiasis   . PONV (postoperative nausea and vomiting)    little nausea after having his hip replacement  . Unspecified disorder of urethra and urinary tract     Past Surgical History:  Procedure Laterality Date  . COLONOSCOPY    . CORONARY ANGIOPLASTY WITH STENT PLACEMENT     06    dr t. wall  . ELECTROCARDIOGRAM  03/30/2007  . ESOPHAGOGASTRODUODENOSCOPY  11/07/2005  . kidney stones    . LUMBAR LAMINECTOMY Right 05/18/2014   Procedure: LUMBAR LAMINECTOMY Microdiscectomy Right Lumbar five - sacral one;  Surgeon: [name], MD;  Location: [facility];  Service: Orthopedics;  Laterality: Right;  . Stress Cardiolite  11/30/2004  . TOTAL HIP ARTHROPLASTY  10/28/2012   Procedure: TOTAL HIP ARTHROPLASTY;  Surgeon: [name], MD;  Location: [facility];  Service: Orthopedics;  Laterality: Right;    Social History   Socioeconomic History  . Marital status: Single    Spouse name: Not on file  . Number of children: 0  . Years of education: Not on file  . Highest  education level: Not on file  Occupational History  . 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 Occupation: Retired  Scientific laboratory technician  . Financial resource strain: Not hard at all  . Food insecurity    Worry: Never true    Inability: Never true  . Transportation needs    Medical: No    Non-medical: No  Tobacco Use  . Smoking status: Current Every Day Smoker    Packs/day: 1.00    Years: 48.00    Pack years: 48.00    Types: Cigarettes  . Smokeless tobacco: Never Used  . Tobacco comment: states he wants to reduce his dependence.  Substance and Sexual Activity  . Alcohol use: Yes    Alcohol/week: 0.0 standard drinks    Comment: rare  . Drug use: No  . Sexual activity: Not Currently  Lifestyle  . Physical activity    Days per week: 0 days    Minutes per session: 0 min  . Stress: Not at all  Relationships  . Social Herbalist on phone: Once a week    Gets together: Never    Attends religious service: Never    Active member of club or organization: No  Attends meetings of clubs or organizations: Never    Relationship status: Never married  . Intimate partner violence    Fear of current or ex partner: Not on file    Emotionally abused: Not on file    Physically abused: Not on file    Forced sexual activity: Not on file  Other Topics Concern  . Not on file  Social History Narrative   Originally from [place], [place]   Lives alone with dog, has a couple of friends he can rely on for support.     Current Outpatient Medications on File Prior to Visit  Medication Sig Dispense Refill  . Ascorbic Acid (VITAMIN C) 1000 MG tablet Take 2,000 mg by mouth daily.    Marland Kitchen aspirin EC 81 MG tablet Take 1 tablet (81 mg total) by mouth daily. 30 tablet 11  . atenolol (TENORMIN) 25 MG tablet Take 1 tablet (25 mg total) by mouth daily. 90 tablet 2  . b complex vitamins tablet Take 1 tablet by mouth daily.    . canagliflozin (INVOKANA)  100 MG TABS tablet Take 1 tablet (100 mg total) by mouth daily before breakfast. 90 tablet 3  . Cholecalciferol (VITAMIN D) 2000 UNITS tablet Take 2,000 Units by mouth daily.    . Coenzyme Q10 (CO Q 10) 100 MG CAPS Take 100 mg by mouth daily.     . Cyanocobalamin (VITAMIN B-12 PO) Take 1 tablet by mouth daily.    . DHA-EPA-VIT B6-B12-FOLIC ACID PO Take 1 tablet by mouth daily.     . fluticasone (FLONASE) 50 MCG/ACT nasal spray Place 2 sprays into both nostrils daily. 16 g 6  . FOLIC ACID PO Take 1 tablet by mouth daily.    . Ginkgo Biloba (GINKOBA PO) Take 1 tablet by mouth daily.    Marland Kitchen glucose blood (ONETOUCH VERIO) test strip 1 each by Other route daily. Use as instructed 90 each 3  . lisinopril (ZESTRIL) 40 MG tablet Take 1 tablet (40 mg total) by mouth daily. Please keep 08/17/19 appointment for further refills 90 tablet 0  . Magnesium 400 MG CAPS Take 400 mg by mouth daily.     . metFORMIN (GLUCOPHAGE-XR) 500 MG 24 hr tablet TAKE 2 TABLETS BY MOUTH EVERY DAY WITH BREAKFAST 180 tablet 3  . Multiple Vitamin (MULTIVITAMIN) tablet Take 1 tablet by mouth daily.    . nitroGLYCERIN (NITROSTAT) 0.4 MG SL tablet Place 1 tablet (0.4 mg total) under the tongue every 5 (five) minutes as needed for chest pain. 30 tablet 11  . pantoprazole (PROTONIX) 40 MG tablet Take 1 tablet (40 mg total) by mouth daily. 30 tablet 11  . rosuvastatin (CRESTOR) 5 MG tablet Take 1 tablet (5 mg total) by mouth daily at 6 PM. 90 tablet 3  . Testosterone 25 MG/2.5GM (1%) GEL PLACE 25 MG ONTO THE SKIN EVERY MORNING. 30 Package 3  . tiZANidine (ZANAFLEX) 2 MG tablet Take 1 tablet (2 mg total) by mouth every 8 (eight) hours as needed for muscle spasms. 30 tablet 0  . VITAMIN A PO Take 1 tablet by mouth every other day.    . vitamin E 100 UNIT capsule Take 100 Units by mouth daily.    . Zinc 50 MG TABS Take 50 mg by mouth daily.     No current facility-administered medications on file prior to visit.     Allergies  Allergen  Reactions  . Actos [Pioglitazone Hydrochloride]     Weight gain  Family History  Problem Relation Age of Onset  . Colon cancer Mother        Colon Cancer-at advanced age  . Heart disease Father   . Breast cancer Sister     BP (!) 144/90 (BP Location: Left Arm, Patient Position: Sitting, Cuff Size: Normal)   Pulse 70   Ht 5' 7.5" (1.715 m)   Wt 209 lb 6.4 oz (95 kg)   SpO2 95%   BMI 32.31 kg/m    Review of Systems He denies hypoglycemia    Objective:   Physical Exam VITAL SIGNS:  See vs page GENERAL: no distress Pulses: dorsalis pedis intact bilat.   MSK: no deformity of the feet.   CV: no leg edema.   Skin:  no ulcer on the feet.  normal color and temp on the feet.   Neuro: sensation is intact to touch on the feet.     Lab Results  Component Value Date   HGBA1C 6.7 (A) 08/10/2019       Assessment & Plan:  HTN: is noted today.  Type 2 DM, with CAD: worse but still well-controlled.   Patient Instructions  Your blood pressure is high today.  Please see your primary care provider soon, to have it rechecked Please continue the same medications for diabetes.   check your blood sugar once a day.  vary the time of day when you check, between before the 3 meals, and at bedtime.  also check if you have symptoms of your blood sugar being too high or too low.  please keep a record of the readings and bring it to your next appointment here (or you can bring the meter itself).  You can write it on any piece of paper.  please call us sooner if your blood sugar goes below 70, or if you have a lot of readings over 200. Please come back for a follow-up appointment in 6 months.

## 2019-08-11 ENCOUNTER — Ambulatory Visit: Payer: Medicare Other | Admitting: Endocrinology

## 2019-08-17 ENCOUNTER — Ambulatory Visit (INDEPENDENT_AMBULATORY_CARE_PROVIDER_SITE_OTHER): Payer: Medicare Other | Admitting: Cardiology

## 2019-08-17 ENCOUNTER — Other Ambulatory Visit: Payer: Self-pay

## 2019-08-17 ENCOUNTER — Encounter: Payer: Self-pay | Admitting: Cardiology

## 2019-08-17 ENCOUNTER — Encounter: Payer: Self-pay | Admitting: *Deleted

## 2019-08-17 VITALS — BP 148/72 | HR 73 | Ht 67.5 in | Wt 208.0 lb

## 2019-08-17 DIAGNOSIS — E785 Hyperlipidemia, unspecified: Secondary | ICD-10-CM

## 2019-08-17 DIAGNOSIS — Z9861 Coronary angioplasty status: Secondary | ICD-10-CM

## 2019-08-17 DIAGNOSIS — R072 Precordial pain: Secondary | ICD-10-CM

## 2019-08-17 DIAGNOSIS — I2510 Atherosclerotic heart disease of native coronary artery without angina pectoris: Secondary | ICD-10-CM | POA: Diagnosis not present

## 2019-08-17 DIAGNOSIS — Z789 Other specified health status: Secondary | ICD-10-CM

## 2019-08-17 DIAGNOSIS — R7989 Other specified abnormal findings of blood chemistry: Secondary | ICD-10-CM

## 2019-08-17 DIAGNOSIS — I2583 Coronary atherosclerosis due to lipid rich plaque: Secondary | ICD-10-CM | POA: Diagnosis not present

## 2019-08-17 NOTE — Patient Instructions (Signed)
Medication Instructions:   Your physician recommends that you continue on your current medications as directed. Please refer to the Current Medication list given to you today.  If you need a refill on your cardiac medications before your next appointment, please call your pharmacy.     Testing/Procedures:  Your physician has requested that you have en exercise stress myoview. For further information please visit [email or web address]. Please follow instruction sheet, as given.    Follow-Up: At [facility], you and your health needs are our priority.  As part of our continuing mission to provide you with exceptional heart care, we have created designated Provider Care Teams.  These Care Teams include your primary Cardiologist (physician) and Advanced Practice Providers (APPs -  Physician Assistants and Nurse Practitioners) who all work together to provide you with the care you need, when you need it. You will need a follow up appointment in 6 months.  Please call our office 2 months in advance to schedule this appointment.  You may see DR. NELSON or one of the following Advanced Practice Providers on your designated Care Team:   Lyda Jester, PA-C Dayna Dunn, PA-C . Ermalinda Barrios, PA-C

## 2019-08-17 NOTE — Progress Notes (Signed)
Cardiology Office Note:    Date:  08/18/2019   ID:  [name] [name], DOB 04-14-1951, MRN [id number]  PCP:  Isaac Bliss, Rayford Halsted, MD  Cardiologist:  No primary care provider on file.  Electrophysiologist:  None   Referring MD: Isaac Bliss, [name]*   Reason for visit: 1 year follow-up  History of Present Illness:    [name] [name] is a 68 y.o. male with a hx of coronary artery disease, s/p  coronary stenting after a positive stress test in 2006. Off Plavix since March 2015. A nuclear stress test in Dec 2015 was normal, normal LVEF as well. He has been experiencing myalgias with different statins also LFTs elevation with a very low-dose of statin such as 5 mg of rosuvastatin daily. He currently experiencing occasional mild chest tightness on exertion, with mild shortness of breath.  Denies any palpitation dizziness or syncope.  No lower extremity edema.  Past Medical History:  Diagnosis Date  . Anemia, iron deficiency 11/18/2008   Resolved, despite no rx--2012    . ANEMIA-IRON DEFICIENCY   . Arthritis   . BACK PAIN, LUMBAR   . CORONARY ARTERY DISEASE   . DIVERTICULOSIS, COLON   . DM    on no medication ?prediabetes  . Elevated LFTs 11/18/2015  . GERD   . Hepatitis    States he was tested for Hep B and he had antibodies  . HYPERLIPIDEMIA   . HYPERTENSION    dr tom wall  . Nephrolithiasis   . PONV (postoperative nausea and vomiting)    little nausea after having his hip replacement  . Unspecified disorder of urethra and urinary tract     Past Surgical History:  Procedure Laterality Date  . COLONOSCOPY    . CORONARY ANGIOPLASTY WITH STENT PLACEMENT     06    dr t. wall  . ELECTROCARDIOGRAM  03/30/2007  . ESOPHAGOGASTRODUODENOSCOPY  11/07/2005  . kidney stones    . LUMBAR LAMINECTOMY Right 05/18/2014   Procedure: LUMBAR LAMINECTOMY Microdiscectomy Right Lumbar five - sacral one;  Surgeon: [name], MD;  Location: [facility];  Service: Orthopedics;  Laterality: Right;   . Stress Cardiolite  11/30/2004  . TOTAL HIP ARTHROPLASTY  10/28/2012   Procedure: TOTAL HIP ARTHROPLASTY;  Surgeon: [name], MD;  Location: [facility];  Service: Orthopedics;  Laterality: Right;    Current Medications: Current Meds  Medication Sig  . Ascorbic Acid (VITAMIN C) 1000 MG tablet Take 2,000 mg by mouth daily.  Marland Kitchen aspirin EC 81 MG tablet Take 1 tablet (81 mg total) by mouth daily.  Marland Kitchen atenolol (TENORMIN) 25 MG tablet Take 1 tablet (25 mg total) by mouth daily.  Marland Kitchen b complex vitamins tablet Take 1 tablet by mouth daily.  . canagliflozin (INVOKANA) 100 MG TABS tablet Take 1 tablet (100 mg total) by mouth daily before breakfast.  . Cholecalciferol (VITAMIN D) 2000 UNITS tablet Take 2,000 Units by mouth daily.  . Coenzyme Q10 (CO Q 10) 100 MG CAPS Take 100 mg by mouth daily.   . Cyanocobalamin (VITAMIN B-12 PO) Take 1 tablet by mouth daily.  . DHA-EPA-VIT B6-B12-FOLIC ACID PO Take 1 tablet by mouth daily.   . fluticasone (FLONASE) 50 MCG/ACT nasal spray Place 2 sprays into both nostrils daily.  Marland Kitchen FOLIC ACID PO Take 1 tablet by mouth daily.  . Ginkgo Biloba (GINKOBA PO) Take 1 tablet by mouth daily.  Marland Kitchen glucose blood (ONETOUCH VERIO) test strip 1 each by Other route daily.  Use as instructed  . lisinopril (ZESTRIL) 40 MG tablet Take 1 tablet (40 mg total) by mouth daily. Please keep 08/17/19 appointment for further refills  . Magnesium 400 MG CAPS Take 400 mg by mouth daily.   . metFORMIN (GLUCOPHAGE-XR) 500 MG 24 hr tablet TAKE 2 TABLETS BY MOUTH EVERY DAY WITH BREAKFAST  . Multiple Vitamin (MULTIVITAMIN) tablet Take 1 tablet by mouth daily.  . nitroGLYCERIN (NITROSTAT) 0.4 MG SL tablet Place 1 tablet (0.4 mg total) under the tongue every 5 (five) minutes as needed for chest pain.  . pantoprazole (PROTONIX) 40 MG tablet Take 1 tablet (40 mg total) by mouth daily.  . rosuvastatin (CRESTOR) 5 MG tablet Take 1 tablet (5 mg total) by mouth daily at 6 PM.  . Testosterone 25 MG/2.5GM (1%)  GEL PLACE 25 MG ONTO THE SKIN EVERY MORNING.  Marland Kitchen tiZANidine (ZANAFLEX) 2 MG tablet Take 1 tablet (2 mg total) by mouth every 8 (eight) hours as needed for muscle spasms.  Marland Kitchen VITAMIN A PO Take 1 tablet by mouth every other day.  . vitamin E 100 UNIT capsule Take 100 Units by mouth daily.  . Zinc 50 MG TABS Take 50 mg by mouth daily.     Allergies:   Actos [pioglitazone hydrochloride]   Social History   Socioeconomic History  . Marital status: Single    Spouse name: Not on file  . Number of children: 0  . Years of education: Not on file  . Highest education level: Not on file  Occupational History  . 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 Occupation: Retired  Scientific laboratory technician  . Financial resource strain: Not hard at all  . Food insecurity    Worry: Never true    Inability: Never true  . Transportation needs    Medical: No    Non-medical: No  Tobacco Use  . Smoking status: Current Every Day Smoker    Packs/day: 1.00    Years: 48.00    Pack years: 48.00    Types: Cigarettes  . Smokeless tobacco: Never Used  . Tobacco comment: states he wants to reduce his dependence.  Substance and Sexual Activity  . Alcohol use: Yes    Alcohol/week: 0.0 standard drinks    Comment: rare  . Drug use: No  . Sexual activity: Not Currently  Lifestyle  . Physical activity    Days per week: 0 days    Minutes per session: 0 min  . Stress: Not at all  Relationships  . Social Herbalist on phone: Once a week    Gets together: Never    Attends religious service: Never    Active member of club or organization: No    Attends meetings of clubs or organizations: Never    Relationship status: Never married  Other Topics Concern  . Not on file  Social History Narrative   Originally from [place], [place]   Lives alone with dog, has a couple of friends he can rely on for support.     Family History: The patient's family history includes  Breast cancer in his sister; Colon cancer in his mother; Heart disease in his father.  ROS:   Please see the history of present illness.    All other systems reviewed and are negative.  EKGs/Labs/Other Studies Reviewed:    The following studies were reviewed today:  EKG:  EKG is ordered today.  The  ekg ordered today demonstrates normal sinus rhythm, right bundle branch block, unchanged from prior.  Recent Labs: 12/16/2018: BUN 17; Creatinine, Ser 0.82; Hemoglobin 16.8; Platelets 166.0; Potassium 4.9; Sodium 143; TSH 2.20 04/09/2019: ALT 55  Recent Lipid Panel    Component Value Date/Time   CHOL 203 (H) 12/16/2018 1246   CHOL 199 02/02/2018 1652   TRIG 249.0 (H) 12/16/2018 1246   HDL 51.00 12/16/2018 1246   HDL 44 02/02/2018 1652   CHOLHDL 4 12/16/2018 1246   VLDL 49.8 (H) 12/16/2018 1246   LDLCALC 108 (H) 02/02/2018 1652   LDLDIRECT 125.0 12/16/2018 1246    Physical Exam:    VS:  BP (!) 148/72   Pulse 73   Ht 5' 7.5" (1.715 m)   Wt 208 lb (94.3 kg)   SpO2 96%   BMI 32.10 kg/m     Wt Readings from Last 3 Encounters:  08/17/19 208 lb (94.3 kg)  08/10/19 209 lb 6.4 oz (95 kg)  04/09/19 208 lb (94.3 kg)    GEN: Well nourished, well developed in no acute distress HEENT: Normal NECK: No JVD; No carotid bruits LYMPHATICS: No lymphadenopathy CARDIAC: RRR, no murmurs, rubs, gallops RESPIRATORY:  Clear to auscultation without rales, wheezing or rhonchi  ABDOMEN: Soft, non-tender, non-distended MUSCULOSKELETAL:  No edema; No deformity  SKIN: Warm and dry NEUROLOGIC:  Alert and oriented x 3 PSYCHIATRIC:  Normal affect   ASSESSMENT:    1. Coronary artery disease involving native coronary artery of native heart without angina pectoris   2. Precordial pain   3. S/P PTCA (percutaneous transluminal coronary angioplasty)   4. Dyslipidemia   5. Coronary artery disease due to lipid rich plaque    PLAN:    In order of problems listed above:  1. H/o CAD, s/p PCI (? Artery)  in 2006, normal nuclear stress test in December 2015, he now has new symptoms, we will schedule an exercise nuclear stress test. We will continue aspirin, he is not tolerating rosuvastatin well and has elevated LFTs, will refer him to the lipid clinic.  2. Hypertension - uncontrolled today, we will evaluate at the stress test and adjust his medication as needed.  3. Hyperlipidemia -chronic elevation of LFTs on low-dose of statins plus myalgias, will refer him to the lipid clinic.  4. Smoking cessation - counseled  5. DM - uncontrolled, HbA1c 7.2%, recently started on Metformin.  Medication Adjustments/Labs and Tests Ordered: Current medicines are reviewed at length with the patient today.  Concerns regarding medicines are outlined above.  Orders Placed This Encounter  Procedures  . MYOCARDIAL PERFUSION IMAGING  . EKG 12-Lead   No orders of the defined types were placed in this encounter.   Patient Instructions  Medication Instructions:   Your physician recommends that you continue on your current medications as directed. Please refer to the Current Medication list given to you today.  If you need a refill on your cardiac medications before your next appointment, please call your pharmacy.     Testing/Procedures:  Your physician has requested that you have en exercise stress myoview. For further information please visit [email or web address]. Please follow instruction sheet, as given.    Follow-Up: At [facility], you and your health needs are our priority.  As part of our continuing mission to provide you with exceptional heart care, we have created designated Provider Care Teams.  These Care Teams include your primary Cardiologist (physician) and Advanced Practice Providers (APPs -  Physician Assistants and Nurse Practitioners) who all  work together to provide you with the care you need, when you need it. You will need a follow up appointment in 6 months.  Please call our  office 2 months in advance to schedule this appointment.  You may see DR. Skippy Marhefka or one of the following Advanced Practice Providers on your designated Care Team:   Lyda Jester, PA-C Dayna Dunn, PA-C . Ermalinda Barrios, PA-C        Signed, [name], MD  08/18/2019 7:04 AM    [place]

## 2019-08-18 ENCOUNTER — Telehealth: Payer: Self-pay | Admitting: *Deleted

## 2019-08-18 DIAGNOSIS — I2510 Atherosclerotic heart disease of native coronary artery without angina pectoris: Secondary | ICD-10-CM

## 2019-08-18 DIAGNOSIS — Z789 Other specified health status: Secondary | ICD-10-CM

## 2019-08-18 DIAGNOSIS — E785 Hyperlipidemia, unspecified: Secondary | ICD-10-CM

## 2019-08-18 DIAGNOSIS — Z9861 Coronary angioplasty status: Secondary | ICD-10-CM

## 2019-08-18 DIAGNOSIS — I2583 Coronary atherosclerosis due to lipid rich plaque: Secondary | ICD-10-CM

## 2019-08-18 NOTE — Telephone Encounter (Signed)
-----   Message from Ramond Dial, [facility] sent at 08/18/2019  5:03 PM EDT ----- Yes he should qualify. We can get him copay assistance if cost is an issue. Curtiss Mahmood or [facility] can you please schedule him an appointment? ----- Message ----- From: Dorothy Spark, MD Sent: 08/18/2019   7:20 AM EDT To: Nuala Alpha, LPN, Leeroy Bock, [place],  This patient has known CAD and intolerance to statins secondary to myalgias and LFTs elevations, food he qualify for PC SK inhibitors or other lipid-lowering therapy, anything that would be affordable given the fact that he has Medicare.  If yes, Karlene Einstein could you send the referral to the lipid clinic?  Thank you,

## 2019-08-18 NOTE — Telephone Encounter (Signed)
Referral to lipid clinic was placed.  Will send a message to [facility] to schedule the pt to see our pharmacist for lipid clinic for approval and start of PCSK9-Inhibitors.

## 2019-08-20 NOTE — Telephone Encounter (Signed)
Pt scheduled for lipid clinic for consideration of PCSK9-Inhibitors, for 10/12 at 3:30 pm.  Pt made aware of appt date and time by [facility] scheduling.

## 2019-08-23 ENCOUNTER — Other Ambulatory Visit: Payer: Self-pay

## 2019-08-23 ENCOUNTER — Telehealth (HOSPITAL_COMMUNITY): Payer: Self-pay | Admitting: *Deleted

## 2019-08-23 ENCOUNTER — Ambulatory Visit (INDEPENDENT_AMBULATORY_CARE_PROVIDER_SITE_OTHER): Payer: Medicare Other

## 2019-08-23 VITALS — BP 138/84 | HR 69

## 2019-08-23 DIAGNOSIS — R7989 Other specified abnormal findings of blood chemistry: Secondary | ICD-10-CM

## 2019-08-23 DIAGNOSIS — E785 Hyperlipidemia, unspecified: Secondary | ICD-10-CM | POA: Diagnosis not present

## 2019-08-23 NOTE — Patient Instructions (Addendum)
Thank you for seeing [place] today!  Labs today - we will call you with the results.  Continue your Crestor (rosuvastatin) once every day.   We will start the paperwork to the insurance company for [place]. We will let you know the status with the approval for this injectable medicine.  Please call us at [phone] if you have any questions.

## 2019-08-23 NOTE — Progress Notes (Signed)
Patient ID: [name] [name]                 DOB: 09-11-1951                    MRN: [id number]     HPI: [name] [name] is a 68 y.o. male patient referred to lipid clinic by [name]. PMH is significant for CAD s/p PCI in 2006, T2DM, HLD, and HTN. Last seen by [name] on 08/17/19 and noted increase in LFTs and myalgias with rosuvastatin. Last AST/ALT from 04/09/19 was 43/55 with rosuvastatin 5 mg once daily but pt also receiving testosterone. Liver doppler in June showed some component of portal hypertension but no splenomegaly. RUQ ultrasound on the same day showed hepatic steatosis without overt signs of cirrhosis. Patient referred to lipid clinic for further recommendations.   Patient presents for initial visit to lipid clinic. He states he is currently taking rosuvastatin 5 mg once daily. This was increased from rosuvastatin 5 mg three times weekly in [place] (AST/ALT 28/36 on three times weekly dosing). He reports he was switched from simvastatin to atorvastatin for increased statin potency back in 2013. He was then switched off atorvastatin to rosuvastatin with increased LFTs in Feb 2017. He states he is still taking testosterone and states he has had issues with elevated liver enzymes prior to testosterone use. He is not interested in alternative therapies or stopping testosterone at this time.   Current Medications: CoQ10, crestor 5 mg daily  Intolerances: Crestor (elevated LFTs, myalgias), simvastatin 80 mg daily, atorvastatin 40 mg daily  Risk Factors: HTN, HLD, CAD, T2DM  LDL goal: <70  Diet: eating lots of sandwiches, 3-5 servings of vegetables per day or supplements with V8 juice. Drinks mostly water, coffee with cream and sugar  Exercise: walks 2-3 miles per week while out shopping  Family History: Breast cancer in his sister; Colon cancer in his mother; Heart disease in his father   Social History: smoking 1 ppd for about 40 years - has tried NRT in the past Very rare  alcohol use.   Labs: 12/16/18: TC 203, TG 249, HDL 51, LDL 125 - crestor 5 mg three times per week  Past Medical History:  Diagnosis Date   Anemia, iron deficiency 11/18/2008   Resolved, despite no rx--2012     ANEMIA-IRON DEFICIENCY    Arthritis    BACK PAIN, LUMBAR    CORONARY ARTERY DISEASE    DIVERTICULOSIS, COLON    DM    on no medication ?prediabetes   Elevated LFTs 11/18/2015   GERD    Hepatitis    States he was tested for Hep B and he had antibodies   HYPERLIPIDEMIA    HYPERTENSION    dr tom wall   Nephrolithiasis    PONV (postoperative nausea and vomiting)    little nausea after having his hip replacement   Unspecified disorder of urethra and urinary tract     Current Outpatient Medications on File Prior to Visit  Medication Sig Dispense Refill   Ascorbic Acid (VITAMIN C) 1000 MG tablet Take 2,000 mg by mouth daily.     aspirin EC 81 MG tablet Take 1 tablet (81 mg total) by mouth daily. 30 tablet 11   atenolol (TENORMIN) 25 MG tablet Take 1 tablet (25 mg total) by mouth daily. 90 tablet 2   b complex vitamins tablet Take 1 tablet by mouth daily.     canagliflozin (INVOKANA) 100 MG TABS  tablet Take 1 tablet (100 mg total) by mouth daily before breakfast. 90 tablet 3   Cholecalciferol (VITAMIN D) 2000 UNITS tablet Take 2,000 Units by mouth daily.     Coenzyme Q10 (CO Q 10) 100 MG CAPS Take 100 mg by mouth daily.      Cyanocobalamin (VITAMIN B-12 PO) Take 1 tablet by mouth daily.     DHA-EPA-VIT B6-B12-FOLIC ACID PO Take 1 tablet by mouth daily.      fluticasone (FLONASE) 50 MCG/ACT nasal spray Place 2 sprays into both nostrils daily. 16 g 6   FOLIC ACID PO Take 1 tablet by mouth daily.     Ginkgo Biloba (GINKOBA PO) Take 1 tablet by mouth daily.     glucose blood (ONETOUCH VERIO) test strip 1 each by Other route daily. Use as instructed 90 each 3   lisinopril (ZESTRIL) 40 MG tablet Take 1 tablet (40 mg total) by mouth daily. Please keep  08/17/19 appointment for further refills 90 tablet 0   Magnesium 400 MG CAPS Take 400 mg by mouth daily.      metFORMIN (GLUCOPHAGE-XR) 500 MG 24 hr tablet TAKE 2 TABLETS BY MOUTH EVERY DAY WITH BREAKFAST 180 tablet 3   Multiple Vitamin (MULTIVITAMIN) tablet Take 1 tablet by mouth daily.     nitroGLYCERIN (NITROSTAT) 0.4 MG SL tablet Place 1 tablet (0.4 mg total) under the tongue every 5 (five) minutes as needed for chest pain. 30 tablet 11   pantoprazole (PROTONIX) 40 MG tablet Take 1 tablet (40 mg total) by mouth daily. 30 tablet 11   rosuvastatin (CRESTOR) 5 MG tablet Take 1 tablet (5 mg total) by mouth daily at 6 PM. 90 tablet 3   Testosterone 25 MG/2.5GM (1%) GEL PLACE 25 MG ONTO THE SKIN EVERY MORNING. 30 Package 3   tiZANidine (ZANAFLEX) 2 MG tablet Take 1 tablet (2 mg total) by mouth every 8 (eight) hours as needed for muscle spasms. 30 tablet 0   VITAMIN A PO Take 1 tablet by mouth every other day.     vitamin E 100 UNIT capsule Take 100 Units by mouth daily.     Zinc 50 MG TABS Take 50 mg by mouth daily.     No current facility-administered medications on file prior to visit.     Allergies  Allergen Reactions   Actos [Pioglitazone Hydrochloride]     Weight gain    Assessment/Plan:  1. Hyperlipidemia - Most recent LDL is above goal (<70). Continue taking rosuvastatin 5 mg once daily. Check lipid panel with direct LDL and hepatic panel today since patient has not eaten yet today. Will submit prior authorization to insurance company for Repatha 140 mg [place] injections once every 14 days. Patient prefers to have 90 day supply. Will contact patient in the next day or two to inform him of lab results and with approval status from insurance company.   Vertis Kelch, PharmD PGY2 Cardiology Pharmacy Resident [facility] [address], [place], [place] [address] Phone: [phone]; Fax: [phone]

## 2019-08-23 NOTE — Telephone Encounter (Signed)
Patient given detailed instructions per Myocardial Perfusion Study Information Sheet for the test on 08/27/19 at 10:30. Patient notified to arrive 15 minutes early and that it is imperative to arrive on time for appointment to keep from having the test rescheduled.  If you need to cancel or reschedule your appointment, please call the office within 24 hours of your appointment. . Patient verbalized understanding.[name] [name]

## 2019-08-24 ENCOUNTER — Other Ambulatory Visit: Payer: Self-pay

## 2019-08-24 ENCOUNTER — Ambulatory Visit: Payer: Medicare Other

## 2019-08-24 ENCOUNTER — Inpatient Hospital Stay (HOSPITAL_COMMUNITY): Admission: RE | Admit: 2019-08-24 | Payer: Medicare Other | Source: Ambulatory Visit

## 2019-08-24 DIAGNOSIS — Z20822 Contact with and (suspected) exposure to covid-19: Secondary | ICD-10-CM

## 2019-08-24 LAB — HEPATIC FUNCTION PANEL
ALT: 70 IU/L — ABNORMAL HIGH (ref 0–44)
AST: 43 IU/L — ABNORMAL HIGH (ref 0–40)
Albumin: 5.1 g/dL — ABNORMAL HIGH (ref 3.8–4.8)
Alkaline Phosphatase: 69 IU/L (ref 39–117)
Bilirubin Total: 0.3 mg/dL (ref 0.0–1.2)
Bilirubin, Direct: 0.12 mg/dL (ref 0.00–0.40)
Total Protein: 7.6 g/dL (ref 6.0–8.5)

## 2019-08-24 LAB — LIPID PANEL
Chol/HDL Ratio: 4.3 ratio (ref 0.0–5.0)
Cholesterol, Total: 202 mg/dL — ABNORMAL HIGH (ref 100–199)
HDL: 47 mg/dL (ref >39)
LDL Chol Calc (NIH): 112 mg/dL — ABNORMAL HIGH (ref 0–99)
Triglycerides: 249 mg/dL — ABNORMAL HIGH (ref 0–149)
VLDL Cholesterol Cal: 43 mg/dL — ABNORMAL HIGH (ref 5–40)

## 2019-08-24 LAB — LDL CHOLESTEROL, DIRECT: LDL Direct: 118 mg/dL — ABNORMAL HIGH (ref 0–99)

## 2019-08-26 ENCOUNTER — Telehealth: Payer: Self-pay

## 2019-08-26 LAB — NOVEL CORONAVIRUS, NAA: SARS-CoV-2, NAA: NOT DETECTED

## 2019-08-26 MED ORDER — REPATHA SURECLICK 140 MG/ML ~~LOC~~ SOAJ: 1.0000 "pen " | SUBCUTANEOUS | 3 refills | Status: DC

## 2019-08-26 NOTE — Telephone Encounter (Signed)
Tried to call pt to inform him of Repatha approval but no answer and unable to leave voicemail. Approved through 02/22/2020. Prescription sent to pharmacy for 90 days.  Will try to call patient again early next week.

## 2019-08-27 ENCOUNTER — Other Ambulatory Visit: Payer: Self-pay

## 2019-08-27 ENCOUNTER — Ambulatory Visit (HOSPITAL_COMMUNITY): Payer: Medicare Other | Attending: Cardiology

## 2019-08-27 DIAGNOSIS — Z9861 Coronary angioplasty status: Secondary | ICD-10-CM | POA: Diagnosis not present

## 2019-08-27 DIAGNOSIS — R072 Precordial pain: Secondary | ICD-10-CM

## 2019-08-27 DIAGNOSIS — I2583 Coronary atherosclerosis due to lipid rich plaque: Secondary | ICD-10-CM

## 2019-08-27 DIAGNOSIS — E785 Hyperlipidemia, unspecified: Secondary | ICD-10-CM | POA: Diagnosis not present

## 2019-08-27 DIAGNOSIS — I2510 Atherosclerotic heart disease of native coronary artery without angina pectoris: Secondary | ICD-10-CM | POA: Diagnosis not present

## 2019-08-27 LAB — MYOCARDIAL PERFUSION IMAGING
Estimated workload: 8.1 METS
Exercise duration (min): 6 min
Exercise duration (sec): 47 s
LV dias vol: 85 mL (ref 62–150)
LV sys vol: 34 mL
MPHR: 153 {beats}/min
Peak HR: 134 {beats}/min
Percent HR: 87 %
Rest HR: 74 {beats}/min
SDS: 2
SRS: 0
SSS: 2
TID: 1.07

## 2019-08-27 MED ORDER — TECHNETIUM TC 99M TETROFOSMIN IV KIT
31.9000 | PACK | Freq: Once | INTRAVENOUS | Status: AC | PRN
  Administered 2019-08-27: 31.9 via INTRAVENOUS
  Filled 2019-08-27: qty 32

## 2019-08-27 MED ORDER — TECHNETIUM TC 99M TETROFOSMIN IV KIT
10.4000 | PACK | Freq: Once | INTRAVENOUS | Status: AC | PRN
  Administered 2019-08-27: 10.4 via INTRAVENOUS
  Filled 2019-08-27: qty 11

## 2019-08-27 MED ORDER — REGADENOSON 0.4 MG/5ML IV SOLN: 0.4000 mg | Freq: Once | INTRAVENOUS | Status: AC

## 2019-08-30 ENCOUNTER — Other Ambulatory Visit: Payer: Self-pay | Admitting: Internal Medicine

## 2019-08-30 ENCOUNTER — Telehealth: Payer: Self-pay

## 2019-08-30 DIAGNOSIS — E785 Hyperlipidemia, unspecified: Secondary | ICD-10-CM

## 2019-08-30 DIAGNOSIS — R7989 Other specified abnormal findings of blood chemistry: Secondary | ICD-10-CM

## 2019-08-30 DIAGNOSIS — I10 Essential (primary) hypertension: Secondary | ICD-10-CM

## 2019-08-30 MED ORDER — NITROGLYCERIN 0.4 MG SL SUBL: 0.4000 mg | SUBLINGUAL_TABLET | SUBLINGUAL | 11 refills | Status: AC | PRN

## 2019-08-30 NOTE — Telephone Encounter (Signed)
-----   Message from Nuala Alpha, LPN sent at [id number]  1:50 PM EDT -----  ----- Message ----- From: Dorothy Spark, MD Sent: 08/30/2019   1:43 PM EDT To: Nuala Alpha, LPN  Normal stress test, no ischemia, normal LVEF

## 2019-08-30 NOTE — Telephone Encounter (Signed)
Called patient again to inform him of lab results and Repatha approval. No answer but was able to leave voicemail with call-back number.

## 2019-08-30 NOTE — Telephone Encounter (Signed)
Patient was made aware of normal stress test results. He confirmed understanding. Patient also requested a refill of his nitroglycerin. Informed patient that it will be sent in to his confirmed pharmacy.

## 2019-09-03 ENCOUNTER — Telehealth: Payer: Self-pay | Admitting: Cardiology

## 2019-09-03 NOTE — Telephone Encounter (Signed)
New Message:      Pt would like for Ivy to call him. He says he needs her to stalk to her about his Medical Records please. That was all the details the he gave me.

## 2019-09-03 NOTE — Telephone Encounter (Signed)
I spoke with pt. He had covid testing done prior to stress test but appt is marked as no show in the computer. He is concerned he will be charged a no show fee.  I spoke with testing site at [facility] and pt went to [facility] for testing. He will not be charged a no show fee. I spoke with pt and told him he would not be billed a no show charge.

## 2019-09-04 ENCOUNTER — Other Ambulatory Visit: Payer: Self-pay | Admitting: Endocrinology

## 2019-09-17 NOTE — Telephone Encounter (Signed)
3rd time trying to reach patient regarding Repatha and scheduling repeat lipids. Left voicemail for pt to return call.

## 2019-09-20 ENCOUNTER — Telehealth: Payer: Self-pay

## 2019-09-20 DIAGNOSIS — E785 Hyperlipidemia, unspecified: Secondary | ICD-10-CM

## 2019-09-20 NOTE — Telephone Encounter (Signed)
Called patient to schedule next lipid check. Patient picked up [place] from pharmacy on 10/17 and gave first injection on 10/18. Patient will receive 3rd dose on 11/15. Will schedule next fasting lipid check + LFTs on 11/18. Follow up with pt via phone call to discuss lab results. Pt verbalized understanding.

## 2019-09-24 ENCOUNTER — Other Ambulatory Visit: Payer: Self-pay | Admitting: Cardiology

## 2019-09-24 DIAGNOSIS — I10 Essential (primary) hypertension: Secondary | ICD-10-CM

## 2019-09-24 DIAGNOSIS — E785 Hyperlipidemia, unspecified: Secondary | ICD-10-CM

## 2019-09-29 ENCOUNTER — Other Ambulatory Visit: Payer: Medicare Other | Admitting: *Deleted

## 2019-09-29 ENCOUNTER — Other Ambulatory Visit: Payer: Self-pay

## 2019-09-29 DIAGNOSIS — E785 Hyperlipidemia, unspecified: Secondary | ICD-10-CM

## 2019-09-30 ENCOUNTER — Telehealth: Payer: Self-pay | Admitting: Pharmacist

## 2019-09-30 ENCOUNTER — Telehealth: Payer: Self-pay

## 2019-09-30 LAB — HEPATIC FUNCTION PANEL
ALT: 93 IU/L — ABNORMAL HIGH (ref 0–44)
AST: 51 IU/L — ABNORMAL HIGH (ref 0–40)
Albumin: 4.5 g/dL (ref 3.8–4.8)
Alkaline Phosphatase: 76 IU/L (ref 39–117)
Bilirubin Total: 0.3 mg/dL (ref 0.0–1.2)
Bilirubin, Direct: 0.13 mg/dL (ref 0.00–0.40)
Total Protein: 7.3 g/dL (ref 6.0–8.5)

## 2019-09-30 LAB — LIPID PANEL
Chol/HDL Ratio: 3.6 ratio (ref 0.0–5.0)
Cholesterol, Total: 167 mg/dL (ref 100–199)
HDL: 46 mg/dL (ref >39)
LDL Chol Calc (NIH): 71 mg/dL (ref 0–99)
Triglycerides: 312 mg/dL — ABNORMAL HIGH (ref 0–149)
VLDL Cholesterol Cal: 50 mg/dL — ABNORMAL HIGH (ref 5–40)

## 2019-09-30 MED ORDER — ROSUVASTATIN CALCIUM 5 MG PO TABS: 5.0000 mg | ORAL_TABLET | Freq: Every day | ORAL | 11 refills | Status: DC

## 2019-09-30 NOTE — Telephone Encounter (Signed)
Patient called back about his lipid results. LDL is improved from baseline after starting Repatha. Patient states he is not taking rosuvastatin anymore. LDL is now 71. His TG are higher than 1 month ago. States labs were fasting. Does not drink soda or sweet tea. He does not drink alcohol. Patient states yes an no if he eats a lot of breads/pasta/rice/sweets. He was encouraged to limit/decrease portion size of carbs and sweets. Will submit prior authorization for Vascepa to insurance. If LDL is still >70 at next check- should consider restarting rosuvastatin, even if its at only a few times a week.

## 2019-09-30 NOTE — Telephone Encounter (Signed)
Called patient back to clarify discontinuation of Crestor. He said that he misunderstood the instructions from the prior visit and thought he was supposed to stop the Crestor when he began the injections. He denied any side effects with taking the Crestor.  Instructed patient that LDL with Repatha alone was at 71 (goal < 70) and adding back the Crestor should bring his LDL below goal. Patient verbalized understanding and is going to resume taking his Crestor in addition to his Repatha.  Will add Crestor back to medication list. Recheck lipid panel and LFTs ~2 months after starting Vascepa and resuming Crestor.

## 2019-09-30 NOTE — Telephone Encounter (Signed)
Called pt to review lab work from yesterday  No response - left voicemail for pt to call back when able.

## 2019-09-30 NOTE — Telephone Encounter (Signed)
Pt left voicemail returning your call.

## 2019-10-04 MED ORDER — VASCEPA 1 G PO CAPS: 2.0000 g | ORAL_CAPSULE | Freq: Two times a day (BID) | ORAL | 5 refills | Status: DC

## 2019-10-04 NOTE — Addendum Note (Signed)
Addended by: Ronna Polio on: 10/04/2019 02:21 PM   Modules accepted: Orders

## 2019-10-29 DIAGNOSIS — Z719 Counseling, unspecified: Secondary | ICD-10-CM | POA: Diagnosis not present

## 2019-10-31 ENCOUNTER — Other Ambulatory Visit: Payer: Self-pay | Admitting: Cardiology

## 2019-10-31 ENCOUNTER — Other Ambulatory Visit: Payer: Self-pay | Admitting: Internal Medicine

## 2019-10-31 DIAGNOSIS — R7989 Other specified abnormal findings of blood chemistry: Secondary | ICD-10-CM

## 2019-11-16 ENCOUNTER — Telehealth: Payer: Self-pay | Admitting: Acute Care

## 2019-11-16 NOTE — Telephone Encounter (Signed)
[name] [name] now has his LCS CT scheduled at [facility] location on 01/03/2020 @ 3:40pm and he is aware

## 2019-12-22 ENCOUNTER — Other Ambulatory Visit: Payer: Self-pay

## 2019-12-23 ENCOUNTER — Encounter: Payer: Self-pay | Admitting: Internal Medicine

## 2019-12-23 ENCOUNTER — Ambulatory Visit (INDEPENDENT_AMBULATORY_CARE_PROVIDER_SITE_OTHER): Payer: Medicare Other | Admitting: Internal Medicine

## 2019-12-23 VITALS — BP 130/80 | HR 71 | Temp 97.5°F | Ht 67.0 in | Wt 207.9 lb

## 2019-12-23 DIAGNOSIS — Z0001 Encounter for general adult medical examination with abnormal findings: Secondary | ICD-10-CM | POA: Diagnosis not present

## 2019-12-23 DIAGNOSIS — H6122 Impacted cerumen, left ear: Secondary | ICD-10-CM | POA: Diagnosis not present

## 2019-12-23 DIAGNOSIS — I2583 Coronary atherosclerosis due to lipid rich plaque: Secondary | ICD-10-CM

## 2019-12-23 DIAGNOSIS — R7989 Other specified abnormal findings of blood chemistry: Secondary | ICD-10-CM | POA: Diagnosis not present

## 2019-12-23 DIAGNOSIS — F17200 Nicotine dependence, unspecified, uncomplicated: Secondary | ICD-10-CM | POA: Diagnosis not present

## 2019-12-23 DIAGNOSIS — E119 Type 2 diabetes mellitus without complications: Secondary | ICD-10-CM

## 2019-12-23 DIAGNOSIS — I10 Essential (primary) hypertension: Secondary | ICD-10-CM | POA: Diagnosis not present

## 2019-12-23 DIAGNOSIS — Z0000 Encounter for general adult medical examination without abnormal findings: Secondary | ICD-10-CM

## 2019-12-23 DIAGNOSIS — E785 Hyperlipidemia, unspecified: Secondary | ICD-10-CM

## 2019-12-23 DIAGNOSIS — I2510 Atherosclerotic heart disease of native coronary artery without angina pectoris: Secondary | ICD-10-CM | POA: Diagnosis not present

## 2019-12-23 DIAGNOSIS — K219 Gastro-esophageal reflux disease without esophagitis: Secondary | ICD-10-CM

## 2019-12-23 LAB — PSA: PSA: 0.44 ng/mL (ref 0.10–4.00)

## 2019-12-23 LAB — CBC WITH DIFFERENTIAL/PLATELET
Basophils Absolute: 0.1 10*3/uL (ref 0.0–0.1)
Basophils Relative: 1.1 % (ref 0.0–3.0)
Eosinophils Absolute: 0.1 10*3/uL (ref 0.0–0.7)
Eosinophils Relative: 1.8 % (ref 0.0–5.0)
HCT: 51.8 % (ref 39.0–52.0)
Hemoglobin: 17.4 g/dL — ABNORMAL HIGH (ref 13.0–17.0)
Lymphocytes Relative: 22.8 % (ref 12.0–46.0)
Lymphs Abs: 1.9 10*3/uL (ref 0.7–4.0)
MCHC: 33.6 g/dL (ref 30.0–36.0)
MCV: 95.4 fl (ref 78.0–100.0)
Monocytes Absolute: 0.8 10*3/uL (ref 0.1–1.0)
Monocytes Relative: 9 % (ref 3.0–12.0)
Neutro Abs: 5.5 10*3/uL (ref 1.4–7.7)
Neutrophils Relative %: 65.3 % (ref 43.0–77.0)
Platelets: 188 10*3/uL (ref 150.0–400.0)
RBC: 5.43 Mil/uL (ref 4.22–5.81)
RDW: 13.8 % (ref 11.5–15.5)
WBC: 8.4 10*3/uL (ref 4.0–10.5)

## 2019-12-23 LAB — COMPREHENSIVE METABOLIC PANEL
ALT: 97 U/L — ABNORMAL HIGH (ref 0–53)
AST: 60 U/L — ABNORMAL HIGH (ref 0–37)
Albumin: 4.8 g/dL (ref 3.5–5.2)
Alkaline Phosphatase: 57 U/L (ref 39–117)
BUN: 13 mg/dL (ref 6–23)
CO2: 27 mEq/L (ref 19–32)
Calcium: 10.3 mg/dL (ref 8.4–10.5)
Chloride: 105 mEq/L (ref 96–112)
Creatinine, Ser: 0.82 mg/dL (ref 0.40–1.50)
GFR: 93.51 mL/min (ref >60.00)
Glucose, Bld: 130 mg/dL — ABNORMAL HIGH (ref 70–99)
Potassium: 4.3 mEq/L (ref 3.5–5.1)
Sodium: 141 mEq/L (ref 135–145)
Total Bilirubin: 0.5 mg/dL (ref 0.2–1.2)
Total Protein: 7.5 g/dL (ref 6.0–8.3)

## 2019-12-23 LAB — TESTOSTERONE: Testosterone: 276.95 ng/dL — ABNORMAL LOW (ref 300.00–890.00)

## 2019-12-23 LAB — LIPID PANEL
Cholesterol: 106 mg/dL (ref 0–200)
HDL: 51.2 mg/dL (ref >39.00)
LDL Cholesterol: 21 mg/dL (ref 0–99)
NonHDL: 54.61
Total CHOL/HDL Ratio: 2
Triglycerides: 170 mg/dL — ABNORMAL HIGH (ref 0.0–149.0)
VLDL: 34 mg/dL (ref 0.0–40.0)

## 2019-12-23 LAB — VITAMIN B12: Vitamin B-12: 621 pg/mL (ref 211–911)

## 2019-12-23 LAB — VITAMIN D 25 HYDROXY (VIT D DEFICIENCY, FRACTURES): VITD: 41.27 ng/mL (ref 30.00–100.00)

## 2019-12-23 LAB — HEMOGLOBIN A1C: Hgb A1c MFr Bld: 7.4 % — ABNORMAL HIGH (ref 4.6–6.5)

## 2019-12-23 LAB — TSH: TSH: 1.71 u[IU]/mL (ref 0.35–4.50)

## 2019-12-23 NOTE — Progress Notes (Signed)
Established Patient Office Visit     This visit occurred during the SARS-CoV-2 public health emergency.  Safety protocols were in place, including screening questions prior to the visit, additional usage of staff PPE, and extensive cleaning of exam room while observing appropriate contact time as indicated for disinfecting solutions.    CC/Reason for Visit: Annual preventive exam and subsequent Medicare wellness visit  HPI: [name] [name] is a 69 y.o. male who is coming in today for the above mentioned reasons. Past Medical History is significant for: Coronary artery disease.Follows with cardiology routinely.He received coronary stenting after a positive stress test in 2006;he remains very active and asymptomatic. Denies anginal chest discomfort. Has a history of benign essential hypertension that has been well controlled, hyperlipidemia on a statin, continues to smoke 1 PPD and is not interested in smoking cessation at the [facility] has type 2 diabetes with amost recent A1c of 6.0. Also has a h/o testosterone deficiency  and is getting testosterone replacement.  He has no acute complaints today.  He has routine eye and dental care.  His vaccinations are up-to-date.  He had a colonoscopy in 2018 and was due for a repeat this year.   Past Medical/Surgical History: Past Medical History:  Diagnosis Date  . Anemia, iron deficiency 11/18/2008   Resolved, despite no rx--2012    . ANEMIA-IRON DEFICIENCY   . Arthritis   . BACK PAIN, LUMBAR   . CORONARY ARTERY DISEASE   . DIVERTICULOSIS, COLON   . DM    on no medication ?prediabetes  . Elevated LFTs 11/18/2015  . GERD   . Hepatitis    States he was tested for Hep B and he had antibodies  . HYPERLIPIDEMIA   . HYPERTENSION    dr tom wall  . Nephrolithiasis   . PONV (postoperative nausea and vomiting)    little nausea after having his hip replacement  . Unspecified disorder of urethra and urinary tract     Past Surgical  History:  Procedure Laterality Date  . COLONOSCOPY    . CORONARY ANGIOPLASTY WITH STENT PLACEMENT     06    dr t. wall  . ELECTROCARDIOGRAM  03/30/2007  . ESOPHAGOGASTRODUODENOSCOPY  11/07/2005  . kidney stones    . LUMBAR LAMINECTOMY Right 05/18/2014   Procedure: LUMBAR LAMINECTOMY Microdiscectomy Right Lumbar five - sacral one;  Surgeon: [name], MD;  Location: [place];  Service: Orthopedics;  Laterality: Right;  . Stress Cardiolite  11/30/2004  . TOTAL HIP ARTHROPLASTY  10/28/2012   Procedure: TOTAL HIP ARTHROPLASTY;  Surgeon: [name], MD;  Location: [place];  Service: Orthopedics;  Laterality: Right;    Social History:  reports that he has been smoking cigarettes. He has a 48.00 pack-year smoking history. He has never used smokeless tobacco. He reports current alcohol use. He reports that he does not use drugs.  Allergies: Allergies  Allergen Reactions  . Actos [Pioglitazone Hydrochloride]     Weight gain    Family History:  Family History  Problem Relation Age of Onset  . Colon cancer Mother        Colon Cancer-at advanced age  . Heart disease Father   . Breast cancer Sister      Current Outpatient Medications:  .  Ascorbic Acid (VITAMIN C) 1000 MG tablet, Take 2,000 mg by mouth daily., Disp: , Rfl:  .  aspirin EC 81 MG tablet, Take 1 tablet (81 mg total) by mouth daily., Disp: 30  tablet, Rfl: 11 .  atenolol (TENORMIN) 25 MG tablet, TAKE 1 TABLET BY MOUTH EVERY DAY, Disp: 90 tablet, Rfl: 2 .  b complex vitamins tablet, Take 1 tablet by mouth daily., Disp: , Rfl:  .  canagliflozin (INVOKANA) 100 MG TABS tablet, Take 1 tablet (100 mg total) by mouth daily before breakfast., Disp: 90 tablet, Rfl: 3 .  Cholecalciferol (VITAMIN D) 2000 UNITS tablet, Take 2,000 Units by mouth daily., Disp: , Rfl:  .  Coenzyme Q10 (CO Q 10) 100 MG CAPS, Take 100 mg by mouth daily. , Disp: , Rfl:  .  Cyanocobalamin (VITAMIN B-12 PO), Take 1 tablet by mouth daily., Disp: , Rfl:  .   Evolocumab (REPATHA SURECLICK) 789 MG/ML SOAJ, Inject 1 pen into the skin every 14 (fourteen) days., Disp: 6 pen, Rfl: 3 .  FOLIC ACID PO, Take 1 tablet by mouth daily., Disp: , Rfl:  .  Ginkgo Biloba (GINKOBA PO), Take 1 tablet by mouth daily., Disp: , Rfl:  .  glucose blood (ONETOUCH VERIO) test strip, 1 each by Other route daily. Use as instructed, Disp: 90 each, Rfl: 3 .  icosapent Ethyl (VASCEPA) 1 g capsule, Take 2 capsules (2 g total) by mouth 2 (two) times daily., Disp: 120 capsule, Rfl: 5 .  lisinopril (ZESTRIL) 40 MG tablet, Take 1 tablet (40 mg total) by mouth daily., Disp: 90 tablet, Rfl: 3 .  Magnesium 400 MG CAPS, Take 400 mg by mouth daily. , Disp: , Rfl:  .  metFORMIN (GLUCOPHAGE-XR) 500 MG 24 hr tablet, TAKE 2 TABLETS BY MOUTH EVERY DAY WITH BREAKFAST, Disp: 180 tablet, Rfl: 3 .  Multiple Vitamin (MULTIVITAMIN) tablet, Take 1 tablet by mouth daily., Disp: , Rfl:  .  nitroGLYCERIN (NITROSTAT) 0.4 MG SL tablet, Place 1 tablet (0.4 mg total) under the tongue every 5 (five) minutes as needed for chest pain., Disp: 30 tablet, Rfl: 11 .  pantoprazole (PROTONIX) 40 MG tablet, Take 1 tablet (40 mg total) by mouth daily., Disp: 30 tablet, Rfl: 11 .  rosuvastatin (CRESTOR) 5 MG tablet, Take 1 tablet (5 mg total) by mouth daily., Disp: 30 tablet, Rfl: 11 .  Testosterone 25 MG/2.5GM (1%) GEL, PLACE 25 MG ONTO THE SKIN EVERY MORNING., Disp: 2.5 g, Rfl: 3 .  tiZANidine (ZANAFLEX) 2 MG tablet, Take 1 tablet (2 mg total) by mouth every 8 (eight) hours as needed for muscle spasms., Disp: 30 tablet, Rfl: 0 .  VITAMIN A PO, Take 1 tablet by mouth every other day., Disp: , Rfl:  .  vitamin E 100 UNIT capsule, Take 100 Units by mouth daily., Disp: , Rfl:  .  Zinc 50 MG TABS, Take 50 mg by mouth daily., Disp: , Rfl:  No current facility-administered medications for this visit.  Facility-Administered Medications Ordered in Other Visits:  .  regadenoson (LEXISCAN) injection SOLN 0.4 mg, 0.4 mg,  Intravenous, Once, Donato Heinz, MD  Review of Systems:  Constitutional: Denies fever, chills, diaphoresis, appetite change and fatigue.  HEENT: Denies photophobia, eye pain, redness, hearing loss, ear pain, congestion, sore throat, rhinorrhea, sneezing, mouth sores, trouble swallowing, neck pain, neck stiffness and tinnitus.   Respiratory: Denies SOB, DOE, cough, chest tightness,  and wheezing.   Cardiovascular: Denies chest pain, palpitations and leg swelling.  Gastrointestinal: Denies nausea, vomiting, abdominal pain, diarrhea, constipation, blood in stool and abdominal distention.  Genitourinary: Denies dysuria, urgency, frequency, hematuria, flank pain and difficulty urinating.  Endocrine: Denies: hot or cold intolerance, sweats, changes in hair  or nails, polyuria, polydipsia. Musculoskeletal: Denies myalgias, back pain, joint swelling, arthralgias and gait problem.  Skin: Denies pallor, rash and wound.  Neurological: Denies dizziness, seizures, syncope, weakness, light-headedness, numbness and headaches.  Hematological: Denies adenopathy. Easy bruising, personal or family bleeding history  Psychiatric/Behavioral: Denies suicidal ideation, mood changes, confusion, nervousness, sleep disturbance and agitation    Physical Exam: Vitals:   12/23/19 1324  BP: 140/80  Pulse: 71  Temp: (!) 97.5 F (36.4 C)  TempSrc: Temporal  SpO2: 93%  Weight: 207 lb 14.4 oz (94.3 kg)  Height: 5' 7"  (1.702 m)    Body mass index is 32.56 kg/m.   Constitutional: NAD, calm, comfortable Eyes: PERRL, lids and conjunctivae normal, wears corrective lenses ENMT: Mucous membranes are moist.Tympanic membrane is pearly white, no erythema or bulging on the right, left is obstructed by cerumen Neck: normal, supple, no masses, no thyromegaly Respiratory: clear to auscultation bilaterally, no wheezing, no crackles. Normal respiratory effort. No accessory muscle use.  Cardiovascular: Regular rate  and rhythm, no murmurs / rubs / gallops. No extremity edema. 2+ pedal pulses. No carotid bruits.  Abdomen: no tenderness, no masses palpated. No hepatosplenomegaly. Bowel sounds positive.  Musculoskeletal: no clubbing / cyanosis. No joint deformity upper and lower extremities. Good ROM, no contractures. Normal muscle tone.  Skin: no rashes, lesions, ulcers. No induration Neurologic: CN 2-12 grossly intact. Sensation intact, DTR normal. Strength 5/5 in all 4.  Psychiatric: Normal judgment and insight. Alert and oriented x 3. Normal mood.   Subsequent Medicare wellness visit   1. Risk factors, based on past  M,S,F -cardiovascular disease risk factors include age, gender, history of coronary artery disease, history of hyperlipidemia, history of hypertension, history of diabetes   2.  Physical activities: Not currently very physically active   3.  Depression/mood:  Stable, not depressed   4.  Hearing:  No perceived issues   5.  ADL's: Independent in all ADLs   6.  Fall risk:  Low fall risk   7.  Home safety: No problems identified   8.  Height weight, and visual acuity: Height and weight as above, visual acuity is 20/20 with each eye independently and together   9.  Counseling:  Advised to follow-up with me in 6 months and with his cardiologist as scheduled   10. Lab orders based on risk factors: Laboratory update will be reviewed   11. Referral :  None today   12. Care plan:  Follow-up 6 months   13. Cognitive assessment:  No cognitive impairment   14. Screening: Patient provided with a written and personalized 5-10 year screening schedule in the AVS.   yes   15. Provider List Update:   PCP, cardiology, ophthalmology  16. Advance Directives: Full code     Office Visit from 12/23/2019 in [place] at [facility] Total Score  0      Fall Risk  12/23/2019 12/16/2018 12/03/2018 11/20/2018 05/25/2018  Falls in the past year? 0 - 0 0 No  Number falls in past yr: 0 0 -  0 -  Injury with Fall? 0 0 - 0 -     Impression and Plan:  Encounter for preventive health examination  -Has routine eye and dental care. -Immunizations are up-to-date and age-appropriate. -Screening labs today. -Healthy lifestyle has been discussed in detail. -PSA for prostate cancer screening today. -His last colonoscopy was in 2018, he is due for repeat in September 2021. -He has annual low-dose CT scans for lung  cancer screening.  Low testosterone -On testosterone supplementation.  He is requesting testosterone levels today, he has been advised that levels may not be accurate as they are not done first thing in the morning.  Coronary artery disease due to lipid rich plaque -Stable,  TOBACCO USER -Discussed again today, not interested in cessation.  Essential hypertension  -Well-controlled.  Type 2 diabetes mellitus without complication, without long-term current use of insulin (HCC)  -Last A1c was 6.7 in September 2020, recheck today.  Dyslipidemia  -Last LDL was 118 in October 2020, goal less than 70. -He remains on statin and Repatha, followed by cardiology.  Gastroesophageal reflux disease without esophagitis -Stable on PPI therapy  Left ear impacted cerumen -Cerumen Desimpaction  After patient consent was obtained, warm water was applied and gentle ear lavage performed on left ear. There were no complications and following the desimpaction the tympanic membranes were visible. Tympanic membranes are intact following the procedure. Auditory canals are normal. The patient reported relief of symptoms after removal of cerumen.    Patient Instructions  -Nice seeing you today!!  -Lab work today; will notify you once results are available.  -See you back in 1 year or sooner as needed.   Preventive Care 19 Years and Older, Male Preventive care refers to lifestyle choices and visits with your health care provider that can promote health and wellness. This includes:   A yearly physical exam. This is also called an annual well check.  Regular dental and eye exams.  Immunizations.  Screening for certain conditions.  Healthy lifestyle choices, such as diet and exercise. What can I expect for my preventive care visit? Physical exam Your health care provider will check:  Height and weight. These may be used to calculate body mass index (BMI), which is a measurement that tells if you are at a healthy weight.  Heart rate and blood pressure.  Your skin for abnormal spots. Counseling Your health care provider may ask you questions about:  Alcohol, tobacco, and drug use.  Emotional well-being.  Home and relationship well-being.  Sexual activity.  Eating habits.  History of falls.  Memory and ability to understand (cognition).  Work and work Statistician. What immunizations do I need?  Influenza (flu) vaccine  This is recommended every year. Tetanus, diphtheria, and pertussis (Tdap) vaccine  You may need a Td booster every 10 years. Varicella (chickenpox) vaccine  You may need this vaccine if you have not already been vaccinated. Zoster (shingles) vaccine  You may need this after age 71. Pneumococcal conjugate (PCV13) vaccine  One dose is recommended after age 86. Pneumococcal polysaccharide (PPSV23) vaccine  One dose is recommended after age 68. Measles, mumps, and rubella (MMR) vaccine  You may need at least one dose of MMR if you were born in 1957 or later. You may also need a second dose. Meningococcal conjugate (MenACWY) vaccine  You may need this if you have certain conditions. Hepatitis A vaccine  You may need this if you have certain conditions or if you travel or work in places where you may be exposed to hepatitis A. Hepatitis B vaccine  You may need this if you have certain conditions or if you travel or work in places where you may be exposed to hepatitis B. Haemophilus influenzae type b (Hib) vaccine  You  may need this if you have certain conditions. You may receive vaccines as individual doses or as more than one vaccine together in one shot (combination vaccines). Talk with your  health care provider about the risks and benefits of combination vaccines. What tests do I need? Blood tests  Lipid and cholesterol levels. These may be checked every 5 years, or more frequently depending on your overall health.  Hepatitis C test.  Hepatitis B test. Screening  Lung cancer screening. You may have this screening every year starting at age 56 if you have a 30-pack-year history of smoking and currently smoke or have quit within the past 15 years.  Colorectal cancer screening. All adults should have this screening starting at age 20 and continuing until age 16. Your health care provider may recommend screening at age 33 if you are at increased risk. You will have tests every 1-10 years, depending on your results and the type of screening test.  Prostate cancer screening. Recommendations will vary depending on your family history and other risks.  Diabetes screening. This is done by checking your blood sugar (glucose) after you have not eaten for a while (fasting). You may have this done every 1-3 years.  Abdominal aortic aneurysm (AAA) screening. You may need this if you are a current or former smoker.  Sexually transmitted disease (STD) testing. Follow these instructions at home: Eating and drinking  Eat a diet that includes fresh fruits and vegetables, whole grains, lean protein, and low-fat dairy products. Limit your intake of foods with high amounts of sugar, saturated fats, and salt.  Take vitamin and mineral supplements as recommended by your health care provider.  Do not drink alcohol if your health care provider tells you not to drink.  If you drink alcohol: ? Limit how much you have to 0-2 drinks a day. ? Be aware of how much alcohol is in your drink. In the [place] one drink equals one  12 oz bottle of beer (355 mL), one 5 oz glass of wine (148 mL), or one 1 oz glass of hard liquor (44 mL). Lifestyle  Take daily care of your teeth and gums.  Stay active. Exercise for at least 30 minutes on 5 or more days each week.  Do not use any products that contain nicotine or tobacco, such as cigarettes, e-cigarettes, and chewing tobacco. If you need help quitting, ask your health care provider.  If you are sexually active, practice safe sex. Use a condom or other form of protection to prevent STIs (sexually transmitted infections).  Talk with your health care provider about taking a low-dose aspirin or statin. What's next?  Visit your health care provider once a year for a well check visit.  Ask your health care provider how often you should have your eyes and teeth checked.  Stay up to date on all vaccines. This information is not intended to replace advice given to you by your health care provider. Make sure you discuss any questions you have with your health care provider. Document Revised: 10/22/2018 Document Reviewed: 10/22/2018 Elsevier Patient Education  2020 [place], [place] [facility]

## 2019-12-23 NOTE — Patient Instructions (Signed)
-Nice seeing you today!!  -Lab work today; will notify you once results are available.  -See you back in 1 year or sooner as needed.   Preventive Care 71 Years and Older, Male Preventive care refers to lifestyle choices and visits with your health care provider that can promote health and wellness. This includes:  A yearly physical exam. This is also called an annual well check.  Regular dental and eye exams.  Immunizations.  Screening for certain conditions.  Healthy lifestyle choices, such as diet and exercise. What can I expect for my preventive care visit? Physical exam Your health care provider will check:  Height and weight. These may be used to calculate body mass index (BMI), which is a measurement that tells if you are at a healthy weight.  Heart rate and blood pressure.  Your skin for abnormal spots. Counseling Your health care provider may ask you questions about:  Alcohol, tobacco, and drug use.  Emotional well-being.  Home and relationship well-being.  Sexual activity.  Eating habits.  History of falls.  Memory and ability to understand (cognition).  Work and work Statistician. What immunizations do I need?  Influenza (flu) vaccine  This is recommended every year. Tetanus, diphtheria, and pertussis (Tdap) vaccine  You may need a Td booster every 10 years. Varicella (chickenpox) vaccine  You may need this vaccine if you have not already been vaccinated. Zoster (shingles) vaccine  You may need this after age 68. Pneumococcal conjugate (PCV13) vaccine  One dose is recommended after age 34. Pneumococcal polysaccharide (PPSV23) vaccine  One dose is recommended after age 53. Measles, mumps, and rubella (MMR) vaccine  You may need at least one dose of MMR if you were born in 1957 or later. You may also need a second dose. Meningococcal conjugate (MenACWY) vaccine  You may need this if you have certain conditions. Hepatitis A vaccine  You  may need this if you have certain conditions or if you travel or work in places where you may be exposed to hepatitis A. Hepatitis B vaccine  You may need this if you have certain conditions or if you travel or work in places where you may be exposed to hepatitis B. Haemophilus influenzae type b (Hib) vaccine  You may need this if you have certain conditions. You may receive vaccines as individual doses or as more than one vaccine together in one shot (combination vaccines). Talk with your health care provider about the risks and benefits of combination vaccines. What tests do I need? Blood tests  Lipid and cholesterol levels. These may be checked every 5 years, or more frequently depending on your overall health.  Hepatitis C test.  Hepatitis B test. Screening  Lung cancer screening. You may have this screening every year starting at age 68 if you have a 30-pack-year history of smoking and currently smoke or have quit within the past 15 years.  Colorectal cancer screening. All adults should have this screening starting at age 68 and continuing until age 80. Your health care provider may recommend screening at age 65 if you are at increased risk. You will have tests every 1-10 years, depending on your results and the type of screening test.  Prostate cancer screening. Recommendations will vary depending on your family history and other risks.  Diabetes screening. This is done by checking your blood sugar (glucose) after you have not eaten for a while (fasting). You may have this done every 1-3 years.  Abdominal aortic aneurysm (AAA) screening.  You may need this if you are a current or former smoker.  Sexually transmitted disease (STD) testing. Follow these instructions at home: Eating and drinking  Eat a diet that includes fresh fruits and vegetables, whole grains, lean protein, and low-fat dairy products. Limit your intake of foods with high amounts of sugar, saturated fats, and  salt.  Take vitamin and mineral supplements as recommended by your health care provider.  Do not drink alcohol if your health care provider tells you not to drink.  If you drink alcohol: ? Limit how much you have to 0-2 drinks a day. ? Be aware of how much alcohol is in your drink. In the [place] one drink equals one 12 oz bottle of beer (355 mL), one 5 oz glass of wine (148 mL), or one 1 oz glass of hard liquor (44 mL). Lifestyle  Take daily care of your teeth and gums.  Stay active. Exercise for at least 30 minutes on 5 or more days each week.  Do not use any products that contain nicotine or tobacco, such as cigarettes, e-cigarettes, and chewing tobacco. If you need help quitting, ask your health care provider.  If you are sexually active, practice safe sex. Use a condom or other form of protection to prevent STIs (sexually transmitted infections).  Talk with your health care provider about taking a low-dose aspirin or statin. What's next?  Visit your health care provider once a year for a well check visit.  Ask your health care provider how often you should have your eyes and teeth checked.  Stay up to date on all vaccines. This information is not intended to replace advice given to you by your health care provider. Make sure you discuss any questions you have with your health care provider. Document Revised: 10/22/2018 Document Reviewed: 10/22/2018 Elsevier Patient Education  2020 [employer].

## 2020-01-03 ENCOUNTER — Other Ambulatory Visit: Payer: Self-pay

## 2020-01-03 ENCOUNTER — Ambulatory Visit
Admission: RE | Admit: 2020-01-03 | Discharge: 2020-01-03 | Disposition: A | Discharge status: Home / Self Care | Payer: Medicare Other | Source: Ambulatory Visit | Attending: Acute Care | Admitting: Acute Care

## 2020-01-03 DIAGNOSIS — F17210 Nicotine dependence, cigarettes, uncomplicated: Secondary | ICD-10-CM

## 2020-01-03 DIAGNOSIS — Z87891 Personal history of nicotine dependence: Secondary | ICD-10-CM

## 2020-01-03 DIAGNOSIS — Z122 Encounter for screening for malignant neoplasm of respiratory organs: Secondary | ICD-10-CM

## 2020-01-06 ENCOUNTER — Other Ambulatory Visit: Payer: Self-pay | Admitting: *Deleted

## 2020-01-06 DIAGNOSIS — Z87891 Personal history of nicotine dependence: Secondary | ICD-10-CM

## 2020-01-06 DIAGNOSIS — F17210 Nicotine dependence, cigarettes, uncomplicated: Secondary | ICD-10-CM

## 2020-01-06 NOTE — Progress Notes (Signed)
Please call patient and let them  know their  low dose Ct was read as a Lung RADS 2: nodules that are benign in appearance and behavior with a very low likelihood of becoming a clinically active cancer due to size or lack of growth. Recommendation per radiology is for a repeat LDCT in 12 months. .Please let them  know we will order and schedule their  annual screening scan for 12/2020. Please let them  know there was notation of CAD on their  scan.  Please remind the patient  that this is a non-gated exam therefore degree or severity of disease  cannot be determined. Please have them  follow up with their PCP regarding potential risk factor modification, dietary therapy or pharmacologic therapy if clinically indicated. Pt.  is  currently on statin therapy. Please place order for annual  screening scan for  12/2020 and fax results to PCP. Thanks so much.

## 2020-02-03 ENCOUNTER — Telehealth: Payer: Self-pay | Admitting: Endocrinology

## 2020-02-03 MED ORDER — FARXIGA 5 MG PO TABS: 5.0000 mg | ORAL_TABLET | Freq: Every day | ORAL | 3 refills | Status: DC

## 2020-02-03 NOTE — Telephone Encounter (Signed)
I have sent a prescription to your pharmacy, to change to [place]

## 2020-02-03 NOTE — Telephone Encounter (Signed)
Patient ph# [phone] called to let Dr. Loanne Drilling know that his insurance no longer covers Invokana. Patient will be out of the above medication by 02/10/20. Patient states his insurance will cover the following medication: [place] and Jardiance [facility]'s Pharm[id number] [place]  [phone] Fax:  [phone]

## 2020-02-03 NOTE — Telephone Encounter (Signed)
Please advise 

## 2020-02-03 NOTE — Telephone Encounter (Signed)
Outpatient Medication Detail   Disp Refills Start End   dapagliflozin propanediol (FARXIGA) 5 MG TABS tablet 90 tablet 3 02/03/2020    Sig - Route: Take 5 mg by mouth daily before breakfast. - Oral   Sent to pharmacy as: dapagliflozin propanediol (FARXIGA) 5 MG Tab tablet   E-Prescribing Status: Receipt confirmed by pharmacy (02/03/2020 11:39 AM EDT)

## 2020-02-09 ENCOUNTER — Ambulatory Visit: Payer: Medicare Other | Admitting: Endocrinology

## 2020-02-10 ENCOUNTER — Other Ambulatory Visit: Payer: Self-pay | Admitting: Internal Medicine

## 2020-02-10 ENCOUNTER — Other Ambulatory Visit: Payer: Self-pay

## 2020-02-10 DIAGNOSIS — E785 Hyperlipidemia, unspecified: Secondary | ICD-10-CM

## 2020-02-15 ENCOUNTER — Encounter: Payer: Self-pay | Admitting: Endocrinology

## 2020-02-15 ENCOUNTER — Other Ambulatory Visit: Payer: Self-pay

## 2020-02-15 ENCOUNTER — Ambulatory Visit (INDEPENDENT_AMBULATORY_CARE_PROVIDER_SITE_OTHER): Payer: Medicare Other | Admitting: Endocrinology

## 2020-02-15 VITALS — BP 128/82 | HR 72 | Ht 67.0 in | Wt 208.0 lb

## 2020-02-15 DIAGNOSIS — E119 Type 2 diabetes mellitus without complications: Secondary | ICD-10-CM | POA: Diagnosis not present

## 2020-02-15 LAB — POCT GLYCOSYLATED HEMOGLOBIN (HGB A1C): Hemoglobin A1C: 7.2 % — AB (ref 4.0–5.6)

## 2020-02-15 MED ORDER — METFORMIN HCL ER 500 MG PO TB24: 1000.0000 mg | ORAL_TABLET | Freq: Every day | ORAL | 3 refills | Status: DC

## 2020-02-15 MED ORDER — RYBELSUS 3 MG PO TABS: 3.0000 mg | ORAL_TABLET | Freq: Every day | ORAL | 3 refills | Status: DC

## 2020-02-15 NOTE — Patient Instructions (Signed)
I have sent a prescription to your pharmacy, to add "Rybelsus." Please continue the same other medications. check your blood sugar once a day.  vary the time of day when you check, between before the 3 meals, and at bedtime.  also check if you have symptoms of your blood sugar being too high or too low.  please keep a record of the readings and bring it to your next appointment here (or you can bring the meter itself).  You can write it on any piece of paper.  please call us sooner if your blood sugar goes below 70, or if you have a lot of readings over 200. Please come back for a follow-up appointment in 3 months.

## 2020-02-15 NOTE — Progress Notes (Signed)
Subjective:    Patient ID: [name] [name], male    DOB: [birth year], 69 y.o.   MRN: [id number]  HPI Pt returns for f/u of DM. DM type: 2 Dx'ed: 4098 Complications: CAD Therapy: 2 oral meds DKA: never Severe hypoglycemia: never.  Pancreatitis: never Other: he has never been on insulin; pioglitizone was favored due to NASH, but he did not tolerate (weight gain).   Interval history: he says cbg's are well-controlled.  pt states he feels well in general.   Past Medical History:  Diagnosis Date  . Anemia, iron deficiency 11/18/2008   Resolved, despite no rx--2012    . ANEMIA-IRON DEFICIENCY   . Arthritis   . BACK PAIN, LUMBAR   . CORONARY ARTERY DISEASE   . DIVERTICULOSIS, COLON   . DM    on no medication ?prediabetes  . Elevated LFTs 11/18/2015  . GERD   . Hepatitis    States he was tested for Hep B and he had antibodies  . HYPERLIPIDEMIA   . HYPERTENSION    dr tom wall  . Nephrolithiasis   . PONV (postoperative nausea and vomiting)    little nausea after having his hip replacement  . Unspecified disorder of urethra and urinary tract     Past Surgical History:  Procedure Laterality Date  . COLONOSCOPY    . CORONARY ANGIOPLASTY WITH STENT PLACEMENT     06    dr t. wall  . ELECTROCARDIOGRAM  03/30/2007  . ESOPHAGOGASTRODUODENOSCOPY  11/07/2005  . kidney stones    . LUMBAR LAMINECTOMY Right 05/18/2014   Procedure: LUMBAR LAMINECTOMY Microdiscectomy Right Lumbar five - sacral one;  Surgeon: [name], MD;  Location: [facility];  Service: Orthopedics;  Laterality: Right;  . Stress Cardiolite  11/30/2004  . TOTAL HIP ARTHROPLASTY  10/28/2012   Procedure: TOTAL HIP ARTHROPLASTY;  Surgeon: [name], MD;  Location: [facility];  Service: Orthopedics;  Laterality: Right;    Social History   Socioeconomic History  . Marital status: Single    Spouse name: Not on file  . Number of children: 0  . Years of education: Not on file  . Highest education level: Not on file    Occupational History  . 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 Occupation: Retired  Tobacco Use  . Smoking status: Current Every Day Smoker    Packs/day: 1.00    Years: 48.00    Pack years: 48.00    Types: Cigarettes  . Smokeless tobacco: Never Used  . Tobacco comment: states he wants to reduce his dependence.  Substance and Sexual Activity  . Alcohol use: Yes    Alcohol/week: 0.0 standard drinks    Comment: rare  . Drug use: No  . Sexual activity: Not Currently  Other Topics Concern  . Not on file  Social History Narrative   Originally from [place], [place]   Lives alone with dog, has a couple of friends he can rely on for support.    Social Determinants of Health   Financial Resource Strain:   . Difficulty of Paying Living Expenses:   Food Insecurity:   . Worried About Charity fundraiser in the Last Year:   . Arboriculturist in the Last Year:   Transportation Needs:   . Film/video editor (Medical):   Marland Kitchen Lack of Transportation (Non-Medical):   Physical Activity:   . Days of Exercise per Week:   .  Minutes of Exercise per Session:   Stress:   . Feeling of Stress :   Social Connections:   . Frequency of Communication with Friends and Family:   . Frequency of Social Gatherings with Friends and Family:   . Attends Religious Services:   . Active Member of Clubs or Organizations:   . Attends Archivist Meetings:   Marland Kitchen Marital Status:   Intimate Partner Violence:   . Fear of Current or Ex-Partner:   . Emotionally Abused:   Marland Kitchen Physically Abused:   . Sexually Abused:     Current Outpatient Medications on File Prior to Visit  Medication Sig Dispense Refill  . Ascorbic Acid (VITAMIN C) 1000 MG tablet Take 2,000 mg by mouth daily.    Marland Kitchen aspirin EC 81 MG tablet Take 1 tablet (81 mg total) by mouth daily. 30 tablet 11  . atenolol (TENORMIN) 25 MG tablet TAKE 1 TABLET BY MOUTH EVERY DAY 90 tablet 2  . b complex  vitamins tablet Take 1 tablet by mouth daily.    . Cholecalciferol (VITAMIN D) 2000 UNITS tablet Take 2,000 Units by mouth daily.    . Coenzyme Q10 (CO Q 10) 100 MG CAPS Take 100 mg by mouth daily.     . Cyanocobalamin (VITAMIN B-12 PO) Take 1 tablet by mouth daily.    . dapagliflozin propanediol (FARXIGA) 5 MG TABS tablet Take 5 mg by mouth daily before breakfast. 90 tablet 3  . Evolocumab (REPATHA SURECLICK) 315 MG/ML SOAJ Inject 1 pen into the skin every 14 (fourteen) days. 6 pen 3  . FOLIC ACID PO Take 1 tablet by mouth daily.    . Ginkgo Biloba (GINKOBA PO) Take 1 tablet by mouth daily.    Marland Kitchen glucose blood (ONETOUCH VERIO) test strip 1 each by Other route daily. Use as instructed 90 each 3  . icosapent Ethyl (VASCEPA) 1 g capsule Take 2 capsules (2 g total) by mouth 2 (two) times daily. 120 capsule 5  . lisinopril (ZESTRIL) 40 MG tablet Take 1 tablet (40 mg total) by mouth daily. 90 tablet 3  . Magnesium 400 MG CAPS Take 400 mg by mouth daily.     . Multiple Vitamin (MULTIVITAMIN) tablet Take 1 tablet by mouth daily.    . nitroGLYCERIN (NITROSTAT) 0.4 MG SL tablet Place 1 tablet (0.4 mg total) under the tongue every 5 (five) minutes as needed for chest pain. 30 tablet 11  . pantoprazole (PROTONIX) 40 MG tablet Take 1 tablet (40 mg total) by mouth daily. 30 tablet 11  . rosuvastatin (CRESTOR) 5 MG tablet TAKE 1 TABLET (5 MG TOTAL) BY MOUTH DAILY AT 6 PM. 90 tablet 1  . Testosterone 25 MG/2.5GM (1%) GEL PLACE 25 MG ONTO THE SKIN EVERY MORNING. 2.5 g 3  . tiZANidine (ZANAFLEX) 2 MG tablet Take 1 tablet (2 mg total) by mouth every 8 (eight) hours as needed for muscle spasms. 30 tablet 0  . VITAMIN A PO Take 1 tablet by mouth every other day.    . vitamin E 100 UNIT capsule Take 100 Units by mouth daily.    . Zinc 50 MG TABS Take 50 mg by mouth daily.     Current Facility-Administered Medications on File Prior to Visit  Medication Dose Route Frequency Provider Last Rate Last Admin  .  regadenoson (LEXISCAN) injection SOLN 0.4 mg  0.4 mg Intravenous Once Donato Heinz, MD        Allergies  Allergen Reactions  . Actos [  Pioglitazone Hydrochloride]     Weight gain    Family History  Problem Relation Age of Onset  . Colon cancer Mother        Colon Cancer-at advanced age  . Heart disease Father   . Breast cancer Sister     BP 128/82   Pulse 72   Ht 5' 7"  (1.702 m)   Wt 208 lb (94.3 kg)   SpO2 95%   BMI 32.58 kg/m    Review of Systems Denies polyuria.      Objective:   Physical Exam VITAL SIGNS:  See vs page GENERAL: no distress Pulses: dorsalis pedis intact bilat.   MSK: no deformity of the feet CV: no leg edema Skin:  no ulcer on the feet.  normal color and temp on the feet. Neuro: sensation is intact to touch on the feet  Lab Results  Component Value Date   CREATININE 0.82 12/23/2019   BUN 13 12/23/2019   NA 141 12/23/2019   K 4.3 12/23/2019   CL 105 12/23/2019   CO2 27 12/23/2019      Lab Results  Component Value Date   HGBA1C 7.2 (A) 02/15/2020       Assessment & Plan:  Type 2 DM, with CAD: he would benefit from increased rx, if it can be done with a regimen that avoids or minimizes hypoglycemia.   Patient Instructions  I have sent a prescription to your pharmacy, to add "Rybelsus." Please continue the same other medications. check your blood sugar once a day.  vary the time of day when you check, between before the 3 meals, and at bedtime.  also check if you have symptoms of your blood sugar being too high or too low.  please keep a record of the readings and bring it to your next appointment here (or you can bring the meter itself).  You can write it on any piece of paper.  please call us sooner if your blood sugar goes below 70, or if you have a lot of readings over 200. Please come back for a follow-up appointment in 3 months.

## 2020-02-25 ENCOUNTER — Encounter: Payer: Self-pay | Admitting: Cardiology

## 2020-02-25 ENCOUNTER — Other Ambulatory Visit: Payer: Self-pay

## 2020-02-25 ENCOUNTER — Ambulatory Visit (INDEPENDENT_AMBULATORY_CARE_PROVIDER_SITE_OTHER): Payer: Medicare Other | Admitting: Cardiology

## 2020-02-25 VITALS — BP 126/80 | HR 67 | Ht 67.0 in | Wt 206.0 lb

## 2020-02-25 DIAGNOSIS — I2583 Coronary atherosclerosis due to lipid rich plaque: Secondary | ICD-10-CM | POA: Diagnosis not present

## 2020-02-25 DIAGNOSIS — I10 Essential (primary) hypertension: Secondary | ICD-10-CM | POA: Diagnosis not present

## 2020-02-25 DIAGNOSIS — I4510 Unspecified right bundle-branch block: Secondary | ICD-10-CM

## 2020-02-25 DIAGNOSIS — I2510 Atherosclerotic heart disease of native coronary artery without angina pectoris: Secondary | ICD-10-CM

## 2020-02-25 NOTE — Patient Instructions (Signed)
Medication Instructions:  No changes *If you need a refill on your cardiac medications before your next appointment, please call your pharmacy*    Follow-Up: At [facility], you and your health needs are our priority.  As part of our continuing mission to provide you with exceptional heart care, we have created designated Provider Care Teams.  These Care Teams include your primary Cardiologist (physician) and Advanced Practice Providers (APPs -  Physician Assistants and Nurse Practitioners) who all work together to provide you with the care you need, when you need it.  We recommend signing up for the patient portal called "MyChart".  Sign up information is provided on this After Visit Summary.  MyChart is used to connect with patients for Virtual Visits (Telemedicine).  Patients are able to view lab/test results, encounter notes, upcoming appointments, etc.  Non-urgent messages can be sent to your provider as well.   To learn more about what you can do with MyChart, go to [email or web address].    Your next appointment:   6 month(s)  The format for your next appointment:   In Person  Provider:   [name], MD   Other Instructions

## 2020-02-25 NOTE — Progress Notes (Signed)
Cardiology Office Note:    Date:  02/25/2020   ID:  [name] [name], DOB [birth year], MRN [id number]  PCP:  Isaac Bliss, Rayford Halsted, MD  Cardiologist:  No primary care provider on file.  Electrophysiologist:  None   Referring MD: Isaac Bliss, [name]*   Reason for visit: 6 months follow-up  History of Present Illness:    [name] [name] is a 69 y.o. male with a hx of coronary artery disease, s/p  coronary stenting after a positive stress test in 2006. Off Plavix since March 2015. A nuclear stress test in Dec 2015 was normal, normal LVEF as well. He has been experiencing myalgias with different statins also LFTs elevation with a very low-dose of statin such as 5 mg of rosuvastatin daily.  He has been seen in our lipid clinic and approved for [place] as well as Vascepa that he is tolerating well.  He is lipids have improved significantly.  He stays active around his house and denies any chest pain or shortness of breath.  He has been having no chest pain or shortness of breath.  No palpitation dizziness or syncope.  Past Medical History:  Diagnosis Date  . Anemia, iron deficiency 11/18/2008   Resolved, despite no rx--2012    . ANEMIA-IRON DEFICIENCY   . Arthritis   . BACK PAIN, LUMBAR   . CORONARY ARTERY DISEASE   . DIVERTICULOSIS, COLON   . DM    on no medication ?prediabetes  . Elevated LFTs 11/18/2015  . GERD   . Hepatitis    States he was tested for Hep B and he had antibodies  . HYPERLIPIDEMIA   . HYPERTENSION    dr tom wall  . Nephrolithiasis   . PONV (postoperative nausea and vomiting)    little nausea after having his hip replacement  . Unspecified disorder of urethra and urinary tract    Past Surgical History:  Procedure Laterality Date  . COLONOSCOPY    . CORONARY ANGIOPLASTY WITH STENT PLACEMENT     06    dr t. wall  . ELECTROCARDIOGRAM  03/30/2007  . ESOPHAGOGASTRODUODENOSCOPY  11/07/2005  . kidney stones    . LUMBAR LAMINECTOMY Right 05/18/2014   Procedure:  LUMBAR LAMINECTOMY Microdiscectomy Right Lumbar five - sacral one;  Surgeon: [name], MD;  Location: [facility];  Service: Orthopedics;  Laterality: Right;  . Stress Cardiolite  11/30/2004  . TOTAL HIP ARTHROPLASTY  10/28/2012   Procedure: TOTAL HIP ARTHROPLASTY;  Surgeon: [name], MD;  Location: [facility];  Service: Orthopedics;  Laterality: Right;   Current Medications: Current Meds  Medication Sig  . Ascorbic Acid (VITAMIN C) 1000 MG tablet Take 2,000 mg by mouth daily.  Marland Kitchen aspirin EC 81 MG tablet Take 1 tablet (81 mg total) by mouth daily.  Marland Kitchen atenolol (TENORMIN) 25 MG tablet TAKE 1 TABLET BY MOUTH EVERY DAY  . b complex vitamins tablet Take 1 tablet by mouth daily.  . Cholecalciferol (VITAMIN D) 2000 UNITS tablet Take 2,000 Units by mouth daily.  . Coenzyme Q10 (CO Q 10) 100 MG CAPS Take 100 mg by mouth daily.   . Cyanocobalamin (VITAMIN B-12 PO) Take 1 tablet by mouth daily.  . dapagliflozin propanediol (FARXIGA) 5 MG TABS tablet Take 5 mg by mouth daily before breakfast.  . Evolocumab (REPATHA SURECLICK) 409 MG/ML SOAJ Inject 1 pen into the skin every 14 (fourteen) days.  Marland Kitchen FOLIC ACID PO Take 1 tablet by mouth daily.  . Loel Ro [facility]  PO) Take 1 tablet by mouth daily.  Marland Kitchen glucose blood (ONETOUCH VERIO) test strip 1 each by Other route daily. Use as instructed  . icosapent Ethyl (VASCEPA) 1 g capsule Take 2 capsules (2 g total) by mouth 2 (two) times daily.  Marland Kitchen lisinopril (ZESTRIL) 40 MG tablet Take 1 tablet (40 mg total) by mouth daily.  . Magnesium 400 MG CAPS Take 400 mg by mouth daily.   . metFORMIN (GLUCOPHAGE-XR) 500 MG 24 hr tablet Take 2 tablets (1,000 mg total) by mouth daily.  . Multiple Vitamin (MULTIVITAMIN) tablet Take 1 tablet by mouth daily.  . nitroGLYCERIN (NITROSTAT) 0.4 MG SL tablet Place 1 tablet (0.4 mg total) under the tongue every 5 (five) minutes as needed for chest pain.  . pantoprazole (PROTONIX) 40 MG tablet Take 1 tablet (40 mg total) by mouth daily.   . rosuvastatin (CRESTOR) 5 MG tablet TAKE 1 TABLET (5 MG TOTAL) BY MOUTH DAILY AT 6 PM.  . Semaglutide (RYBELSUS) 3 MG TABS Take 3 mg by mouth daily.  Marland Kitchen tiZANidine (ZANAFLEX) 2 MG tablet Take 1 tablet (2 mg total) by mouth every 8 (eight) hours as needed for muscle spasms.  Marland Kitchen VITAMIN A PO Take 1 tablet by mouth every other day.  . vitamin E 100 UNIT capsule Take 100 Units by mouth daily.  . Zinc 50 MG TABS Take 50 mg by mouth daily.    Allergies:   Actos [pioglitazone hydrochloride]   Social History   Socioeconomic History  . Marital status: Single    Spouse name: Not on file  . Number of children: 0  . Years of education: Not on file  . Highest education level: Not on file  Occupational History  . 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 Occupation: Retired  Tobacco Use  . Smoking status: Current Every Day Smoker    Packs/day: 1.00    Years: 48.00    Pack years: 48.00    Types: Cigarettes  . Smokeless tobacco: Never Used  . Tobacco comment: states he wants to reduce his dependence.  Substance and Sexual Activity  . Alcohol use: Yes    Alcohol/week: 0.0 standard drinks    Comment: rare  . Drug use: No  . Sexual activity: Not Currently  Other Topics Concern  . Not on file  Social History Narrative   Originally from [place], [place]   Lives alone with dog, has a couple of friends he can rely on for support.    Social Determinants of Health   Financial Resource Strain:   . Difficulty of Paying Living Expenses:   Food Insecurity:   . Worried About Charity fundraiser in the Last Year:   . Arboriculturist in the Last Year:   Transportation Needs:   . Film/video editor (Medical):   Marland Kitchen Lack of Transportation (Non-Medical):   Physical Activity:   . Days of Exercise per Week:   . Minutes of Exercise per Session:   Stress:   . Feeling of Stress :   Social Connections:   . Frequency of Communication with Friends and  Family:   . Frequency of Social Gatherings with Friends and Family:   . Attends Religious Services:   . Active Member of Clubs or Organizations:   . Attends Archivist Meetings:   Marland Kitchen Marital Status:     Family History: The patient's family history includes Breast cancer in  his sister; Colon cancer in his mother; Heart disease in his father.  ROS:   Please see the history of present illness.    All other systems reviewed and are negative.  EKGs/Labs/Other Studies Reviewed:    The following studies were reviewed today:  EKG:  EKG is ordered today.  The ekg ordered today demonstrates normal sinus rhythm, right bundle branch block, unchanged from prior.  This was personally reviewed.  Recent Labs: 12/23/2019: ALT 97; BUN 13; Creatinine, Ser 0.82; Hemoglobin 17.4; Platelets 188.0; Potassium 4.3; Sodium 141; TSH 1.71   Recent Lipid Panel    Component Value Date/Time   CHOL 106 12/23/2019 1444   CHOL 167 09/29/2019 1341   TRIG 170.0 (H) 12/23/2019 1444   HDL 51.20 12/23/2019 1444   HDL 46 09/29/2019 1341   CHOLHDL 2 12/23/2019 1444   VLDL 34.0 12/23/2019 1444   LDLCALC 21 12/23/2019 1444   LDLCALC 71 09/29/2019 1341   LDLDIRECT 118 (H) 08/23/2019 1552   LDLDIRECT 125.0 12/16/2018 1246   Physical Exam:    VS:  BP 126/80   Pulse 67   Ht 5' 7"  (1.702 m)   Wt 206 lb (93.4 kg)   SpO2 95%   BMI 32.26 kg/m     Wt Readings from Last 3 Encounters:  02/25/20 206 lb (93.4 kg)  02/15/20 208 lb (94.3 kg)  12/23/19 207 lb 14.4 oz (94.3 kg)    GEN: Well nourished, well developed in no acute distress HEENT: Normal NECK: No JVD; No carotid bruits LYMPHATICS: No lymphadenopathy CARDIAC: RRR, no murmurs, rubs, gallops RESPIRATORY:  Clear to auscultation without rales, wheezing or rhonchi  ABDOMEN: Soft, non-tender, non-distended MUSCULOSKELETAL:  No edema; No deformity  SKIN: Warm and dry NEUROLOGIC:  Alert and oriented x 3 PSYCHIATRIC:  Normal affect   ASSESSMENT:     1. Essential hypertension   2. Atherosclerosis of coronary artery without angina pectoris, unspecified vessel or lesion type, unspecified whether native or transplanted heart   3. RBBB   4. Coronary artery disease due to lipid rich plaque    PLAN:    In order of problems listed above:  CAD, s/p PCI (? Artery) in 2006, he had negative stress test in October 2020 with no ischemia or scar, normal LVEF 60 to 65%.  He is now asymptomatic.  We will continue aspirin, rosuvastatin, Repatha and Vascepa.    Hypertension -well-controlled on current regimen  Hyperlipidemia -chronic elevation of LFTs on low-dose of statins plus myalgias, he is now on Repatha, Vascepa and very low-dose of rosuvastatin that he is now tolerating, his lipids have improved significantly, triglycerides are down from 312 to 170.  LDL down from 108 to 21, HDL 51.  Smoking cessation   DM - uncontrolled, HbA1c 7.2%, recently started [place], Rybelsus.  Medication Adjustments/Labs and Tests Ordered: Current medicines are reviewed at length with the patient today.  Concerns regarding medicines are outlined above.  Orders Placed This Encounter  Procedures  . EKG 12-Lead   No orders of the defined types were placed in this encounter.   Patient Instructions  Medication Instructions:  No changes *If you need a refill on your cardiac medications before your next appointment, please call your pharmacy*    Follow-Up: At [facility], you and your health needs are our priority.  As part of our continuing mission to provide you with exceptional heart care, we have created designated Provider Care Teams.  These Care Teams include your primary Cardiologist (physician) and Advanced Practice  Providers (APPs -  Physician Assistants and Nurse Practitioners) who all work together to provide you with the care you need, when you need it.  We recommend signing up for the patient portal called "MyChart".  Sign up information is  provided on this After Visit Summary.  MyChart is used to connect with patients for Virtual Visits (Telemedicine).  Patients are able to view lab/test results, encounter notes, upcoming appointments, etc.  Non-urgent messages can be sent to your provider as well.   To learn more about what you can do with MyChart, go to [email or web address].    Your next appointment:   6 month(s)  The format for your next appointment:   In Person  Provider:   [name], MD   Other Instructions      Signed, [name], MD  02/25/2020 1:46 PM    [place]

## 2020-03-22 ENCOUNTER — Other Ambulatory Visit: Payer: Self-pay

## 2020-03-23 ENCOUNTER — Encounter: Payer: Self-pay | Admitting: Internal Medicine

## 2020-03-23 ENCOUNTER — Ambulatory Visit (INDEPENDENT_AMBULATORY_CARE_PROVIDER_SITE_OTHER): Payer: Medicare Other | Admitting: Internal Medicine

## 2020-03-23 VITALS — BP 110/70 | HR 74 | Temp 97.5°F | Wt 205.2 lb

## 2020-03-23 DIAGNOSIS — R7989 Other specified abnormal findings of blood chemistry: Secondary | ICD-10-CM

## 2020-03-23 DIAGNOSIS — E119 Type 2 diabetes mellitus without complications: Secondary | ICD-10-CM | POA: Diagnosis not present

## 2020-03-23 DIAGNOSIS — I10 Essential (primary) hypertension: Secondary | ICD-10-CM

## 2020-03-23 DIAGNOSIS — K219 Gastro-esophageal reflux disease without esophagitis: Secondary | ICD-10-CM

## 2020-03-23 DIAGNOSIS — E785 Hyperlipidemia, unspecified: Secondary | ICD-10-CM | POA: Diagnosis not present

## 2020-03-23 NOTE — Progress Notes (Signed)
Established Patient Office Visit     This visit occurred during the SARS-CoV-2 public health emergency.  Safety protocols were in place, including screening questions prior to the visit, additional usage of staff PPE, and extensive cleaning of exam room while observing appropriate contact time as indicated for disinfecting solutions.    CC/Reason for Visit: 55-monthfollow-up chronic medical conditions  HPI: [name] DZIKOWSKIis a 69y.o. male who is coming in today for the above mentioned reasons. Past Medical History is significant for:  Coronary artery disease.Follows with cardiology routinely.He received coronary stenting after a positive stress test in 2006;he remains very active and asymptomatic. Denies anginal chest discomfort. Has a history of benign essential hypertension that has been well controlled, hyperlipidemia on a statin, continues to smoke 1 PPD and [facility] with amost recent A1cof 6.0.Also has a h/o testosterone deficiency.  At last visit was noticed to have hyperviscosity with hemoglobin of 17.4.  It was decided to give him a holiday off of testosterone.  He has been having decreased energy and fatigue.  He would like to restart testosterone if possible.   Past Medical/Surgical History: Past Medical History:  Diagnosis Date  . Anemia, iron deficiency 11/18/2008   Resolved, despite no rx--2012    . ANEMIA-IRON DEFICIENCY   . Arthritis   . BACK PAIN, LUMBAR   . CORONARY ARTERY DISEASE   . DIVERTICULOSIS, COLON   . DM    on no medication ?prediabetes  . Elevated LFTs 11/18/2015  . GERD   . Hepatitis    States he was tested for Hep B and he had antibodies  . HYPERLIPIDEMIA   . HYPERTENSION    dr tom wall  . Nephrolithiasis   . PONV (postoperative nausea and vomiting)    little nausea after having his hip replacement  . Unspecified disorder of urethra and urinary tract     Past  Surgical History:  Procedure Laterality Date  . COLONOSCOPY    . CORONARY ANGIOPLASTY WITH STENT PLACEMENT     06    dr t. wall  . ELECTROCARDIOGRAM  03/30/2007  . ESOPHAGOGASTRODUODENOSCOPY  11/07/2005  . kidney stones    . LUMBAR LAMINECTOMY Right 05/18/2014   Procedure: LUMBAR LAMINECTOMY Microdiscectomy Right Lumbar five - sacral one;  Surgeon: MMarybelle Killings MD;  [facility]  Service: Orthopedics;  Laterality: Right;  . Stress Cardiolite  11/30/2004  . TOTAL HIP ARTHROPLASTY  10/28/2012   Procedure: TOTAL HIP ARTHROPLASTY;  Surgeon: DNinetta Lights MD;  [facility]  Service: Orthopedics;  Laterality: Right;    Social History:  reports that he has been smoking cigarettes. He has a 48.00 pack-year smoking history. He has never used smokeless tobacco. He reports current alcohol use. He reports that he does not use drugs.  Allergies: Allergies  Allergen Reactions  . Actos [Pioglitazone Hydrochloride]     Weight gain    Family History:  Family History  Problem Relation Age of Onset  . Colon cancer Mother        Colon Cancer-at advanced age  . Heart disease Father   . Breast cancer Sister      Current Outpatient Medications:  .  Ascorbic Acid (VITAMIN C) 1000 MG tablet, Take 2,000 mg by mouth daily., Disp: , Rfl:  .  aspirin EC 81 MG tablet, Take 1 tablet (81 mg total) by mouth daily., Disp: 30 tablet, Rfl:  11 .  atenolol (TENORMIN) 25 MG tablet, TAKE 1 TABLET BY MOUTH EVERY DAY, Disp: 90 tablet, Rfl: 2 .  b complex vitamins tablet, Take 1 tablet by mouth daily., Disp: , Rfl:  .  Cholecalciferol (VITAMIN D) 2000 UNITS tablet, Take 2,000 Units by mouth daily., Disp: , Rfl:  .  Coenzyme Q10 (CO Q 10) 100 MG CAPS, Take 100 mg by mouth daily. , Disp: , Rfl:  .  Cyanocobalamin (VITAMIN B-12 PO), Take 1 tablet by mouth daily., Disp: , Rfl:  .  dapagliflozin propanediol (FARXIGA) 5 MG TABS tablet, Take 5 mg by mouth daily before breakfast., Disp: 90 tablet, Rfl: 3 .   Evolocumab (REPATHA SURECLICK) 585 MG/ML SOAJ, Inject 1 pen into the skin every 14 (fourteen) days., Disp: 6 pen, Rfl: 3 .  FOLIC ACID PO, Take 1 tablet by mouth daily., Disp: , Rfl:  .  Ginkgo Biloba (GINKOBA PO), Take 1 tablet by mouth daily., Disp: , Rfl:  .  glucose blood (ONETOUCH VERIO) test strip, 1 each by Other route daily. Use as instructed, Disp: 90 each, Rfl: 3 .  icosapent Ethyl (VASCEPA) 1 g capsule, Take 2 capsules (2 g total) by mouth 2 (two) times daily., Disp: 120 capsule, Rfl: 5 .  lisinopril (ZESTRIL) 40 MG tablet, Take 1 tablet (40 mg total) by mouth daily., Disp: 90 tablet, Rfl: 3 .  Magnesium 400 MG CAPS, Take 400 mg by mouth daily. , Disp: , Rfl:  .  metFORMIN (GLUCOPHAGE-XR) 500 MG 24 hr tablet, Take 2 tablets (1,000 mg total) by mouth daily., Disp: 180 tablet, Rfl: 3 .  Multiple Vitamin (MULTIVITAMIN) tablet, Take 1 tablet by mouth daily., Disp: , Rfl:  .  nitroGLYCERIN (NITROSTAT) 0.4 MG SL tablet, Place 1 tablet (0.4 mg total) under the tongue every 5 (five) minutes as needed for chest pain., Disp: 30 tablet, Rfl: 11 .  pantoprazole (PROTONIX) 40 MG tablet, Take 1 tablet (40 mg total) by mouth daily., Disp: 30 tablet, Rfl: 11 .  rosuvastatin (CRESTOR) 5 MG tablet, TAKE 1 TABLET (5 MG TOTAL) BY MOUTH DAILY AT 6 PM., Disp: 90 tablet, Rfl: 1 .  Semaglutide (RYBELSUS) 3 MG TABS, Take 3 mg by mouth daily., Disp: 90 tablet, Rfl: 3 .  tiZANidine (ZANAFLEX) 2 MG tablet, Take 1 tablet (2 mg total) by mouth every 8 (eight) hours as needed for muscle spasms., Disp: 30 tablet, Rfl: 0 .  VITAMIN A PO, Take 1 tablet by mouth every other day., Disp: , Rfl:  .  vitamin E 100 UNIT capsule, Take 100 Units by mouth daily., Disp: , Rfl:  .  Zinc 50 MG TABS, Take 50 mg by mouth daily., Disp: , Rfl:  No current facility-administered medications for this visit.  Facility-Administered Medications Ordered in Other Visits:  .  regadenoson (LEXISCAN) injection SOLN 0.4 mg, 0.4 mg, Intravenous,  Once, Donato Heinz, MD  Review of Systems:  Constitutional: Denies fever, chills, diaphoresis, appetite change. HEENT: Denies photophobia, eye pain, redness, hearing loss, ear pain, congestion, sore throat, rhinorrhea, sneezing, mouth sores, trouble swallowing, neck pain, neck stiffness and tinnitus.   Respiratory: Denies SOB, DOE, cough, chest tightness,  and wheezing.   Cardiovascular: Denies chest pain, palpitations and leg swelling.  Gastrointestinal: Denies nausea, vomiting, abdominal pain, diarrhea, constipation, blood in stool and abdominal distention.  Genitourinary: Denies dysuria, urgency, frequency, hematuria, flank pain and difficulty urinating.  Endocrine: Denies: hot or cold intolerance, sweats, changes in hair or nails, polyuria, polydipsia. Musculoskeletal: Denies  myalgias, back pain, joint swelling, arthralgias and gait problem.  Skin: Denies pallor, rash and wound.  Neurological: Denies dizziness, seizures, syncope, weakness, light-headedness, numbness and headaches.  Hematological: Denies adenopathy. Easy bruising, personal or family bleeding history  Psychiatric/Behavioral: Denies suicidal ideation, mood changes, confusion, nervousness, sleep disturbance and agitation    Physical Exam: Vitals:   03/23/20 1506  BP: 110/70  Pulse: 74  Temp: (!) 97.5 F (36.4 C)  TempSrc: Temporal  SpO2: 96%  Weight: 205 lb 3.2 oz (93.1 kg)    Body mass index is 32.14 kg/m.   Constitutional: NAD, calm, comfortable Eyes: PERRL, lids and conjunctivae normal, wears corrective lenses ENMT: Mucous membranes are moist.  Respiratory: clear to auscultation bilaterally, no wheezing, no crackles. Normal respiratory effort. No accessory muscle use.  Cardiovascular: Regular rate and rhythm, no murmurs / rubs / gallops. No extremity edema. Neurologic: Grossly intact and nonfocal Psychiatric: Normal judgment and insight. Alert and oriented x 3. Normal mood.    Impression and  Plan:  Low testosterone  -Check CBC today. -If hyperviscosity improved, consider restarting testosterone per request.  Essential hypertension -Well-controlled  Gastroesophageal reflux disease without esophagitis -Well-controlled  Dyslipidemia -Extremely well controlled with an LDL of 21, follows with cardiology.   Type 2 diabetes mellitus without complication, without long-term current use of insulin ([place]) -Followed by endocrine, most recent A1c was 7.2 on April 6.    Patient Instructions  -Nice seeing you today!!  -Lab work today; will notify you once results are available.  -Schedule follow up in 4 months.     Lelon Frohlich, MD [facility] at [address] [facility]

## 2020-03-23 NOTE — Patient Instructions (Signed)
-  Nice seeing you today!!  -Lab work today; will notify you once results are available.  -Schedule follow up in 4 months.

## 2020-03-24 ENCOUNTER — Other Ambulatory Visit: Payer: Self-pay | Admitting: Internal Medicine

## 2020-03-24 ENCOUNTER — Telehealth: Payer: Self-pay | Admitting: Internal Medicine

## 2020-03-24 DIAGNOSIS — E291 Testicular hypofunction: Secondary | ICD-10-CM

## 2020-03-24 DIAGNOSIS — I10 Essential (primary) hypertension: Secondary | ICD-10-CM

## 2020-03-24 DIAGNOSIS — E119 Type 2 diabetes mellitus without complications: Secondary | ICD-10-CM

## 2020-03-24 LAB — CBC WITH DIFFERENTIAL/PLATELET
Basophils Absolute: 0 10*3/uL (ref 0.0–0.1)
Basophils Relative: 0.5 % (ref 0.0–3.0)
Eosinophils Absolute: 0.2 10*3/uL (ref 0.0–0.7)
Eosinophils Relative: 1.9 % (ref 0.0–5.0)
HCT: 49 % (ref 39.0–52.0)
Hemoglobin: 16.3 g/dL (ref 13.0–17.0)
Lymphocytes Relative: 25.5 % (ref 12.0–46.0)
Lymphs Abs: 2.1 10*3/uL (ref 0.7–4.0)
MCHC: 33.2 g/dL (ref 30.0–36.0)
MCV: 94.4 fl (ref 78.0–100.0)
Monocytes Absolute: 0.7 10*3/uL (ref 0.1–1.0)
Monocytes Relative: 8.2 % (ref 3.0–12.0)
Neutro Abs: 5.2 10*3/uL (ref 1.4–7.7)
Neutrophils Relative %: 63.9 % (ref 43.0–77.0)
Platelets: 262 10*3/uL (ref 150.0–400.0)
RBC: 5.19 Mil/uL (ref 4.22–5.81)
RDW: 14.5 % (ref 11.5–15.5)
WBC: 8.1 10*3/uL (ref 4.0–10.5)

## 2020-03-24 NOTE — Telephone Encounter (Signed)
Reviewed labs with patient

## 2020-03-24 NOTE — Telephone Encounter (Signed)
[name] [name] pt is returning your call.

## 2020-03-24 NOTE — Chronic Care Management (AMB) (Signed)
  Chronic Care Management   Note  03/24/2020 Name: [name] [name] MRN: [id number] DOB: [birth year]  [name] [name] is a 69 y.o. year old male who is a primary care patient of Isaac Bliss, Rayford Halsted, MD. I reached out to Zigmund Daniel by phone today in response to a referral sent by [name] [name]'s PCP, Isaac Bliss, Rayford Halsted, MD.   [name] [name] was given information about Chronic Care Management services today including:  1. CCM service includes personalized support from designated clinical staff supervised by his physician, including individualized plan of care and coordination with other care providers 2. 24/7 contact phone numbers for assistance for urgent and routine care needs. 3. Service will only be billed when office clinical staff spend 20 minutes or more in a month to coordinate care. 4. Only one practitioner may furnish and bill the service in a calendar month. 5. The patient may stop CCM services at any time (effective at the end of the month) by phone call to the office staff.   Patient agreed to services and verbal consent obtained.   Follow up plan:   [place]

## 2020-04-06 ENCOUNTER — Other Ambulatory Visit: Payer: Self-pay | Admitting: Cardiology

## 2020-04-20 NOTE — Telephone Encounter (Signed)
Done. Order placed.

## 2020-04-20 NOTE — Addendum Note (Signed)
Addended by: Gwenyth Ober R on: 04/20/2020 09:37 AM   Modules accepted: Orders

## 2020-04-21 ENCOUNTER — Other Ambulatory Visit: Payer: Self-pay

## 2020-04-21 ENCOUNTER — Ambulatory Visit: Payer: Medicare Other

## 2020-04-21 DIAGNOSIS — E785 Hyperlipidemia, unspecified: Secondary | ICD-10-CM

## 2020-04-21 DIAGNOSIS — K219 Gastro-esophageal reflux disease without esophagitis: Secondary | ICD-10-CM

## 2020-04-21 DIAGNOSIS — E119 Type 2 diabetes mellitus without complications: Secondary | ICD-10-CM

## 2020-04-21 DIAGNOSIS — M545 Low back pain, unspecified: Secondary | ICD-10-CM

## 2020-04-21 DIAGNOSIS — I10 Essential (primary) hypertension: Secondary | ICD-10-CM

## 2020-04-21 DIAGNOSIS — R7989 Other specified abnormal findings of blood chemistry: Secondary | ICD-10-CM

## 2020-04-21 DIAGNOSIS — I2510 Atherosclerotic heart disease of native coronary artery without angina pectoris: Secondary | ICD-10-CM

## 2020-04-21 NOTE — Patient Instructions (Addendum)
Visit Information  Goals Addressed            This Visit's Progress   . Pharmacy Care Plan       CARE PLAN ENTRY  Current Barriers:  . Chronic Disease Management support, education, and care coordination needs related to Hypertension, Hyperlipidemia, Diabetes, Coronary Artery Disease, GERD, Tobacco use, and Back pain, low testosterone   Hypertension . Pharmacist Clinical Goal(s): o Over the next 180 days, patient will work with PharmD and providers to maintain BP goal <130/80 . Current regimen:   Atenolol (Tenormin) 58m, 1 tablet once daily   Lisinopril 46m 1 tablet once daily . Patient self care activities - Over the next 180 days, patient will: o Ensure daily salt intake < 2300 mg/day  Hyperlipidemia/ Coronary atherosclerosis  . Pharmacist Clinical Goal(s): o Over the next 180 days, patient will work with PharmD and providers to maintain LDL goal < 70 . Current regimen:   evolocumab (Repatha) 14024mml, inject 1 pen into skin every fourteen days  icosapent ethyl (Vascepa) 1g, take 2 capsules twice daily   Rosuvastatin 5mg66m tablet once daily at 6pm  Nitroglycerin 0.4mg 86m place 1 tablet under tongue every 5 minutes as needed for chest pain  Aspirin 81mg,77mblet once daily  . Patient self care activities - Over the next 180 days, patient will: o Continue current medications as directed.   Diabetes . Pharmacist Clinical Goal(s): o Over the next 180 days, patient will work with PharmD and providers to achieve A1c goal <7% . Current regimen:   dapagliflozin (Farxiga) 5mg, 176mblet once daily before breakfast  Metformin ER 500mg, 243mlet once daily  semaglutide (Rybelsus) 3mg, 1 t72met once daily  . Interventions: o We discussed if checking blood sugars later in the day, check at least 2 hours after meal.  . Patient self care activities - Over the next 180 days, patient will: o Check blood sugar as instructed, document, and provide at future  appointments o Contact provider with any episodes of hypoglycemia  GERD (acid reflux)  . Pharmacist Clinical Goal(s) o Over the next 180 days, patient will work with PharmD and providers to minimize reflux symptoms  . Current regimen:  o Pantoprazole 40mg, 1 t32mt once daily  . Interventions: o Complications of long-term PPIs (pantoprazole) and to avoid abrupt discontinuation if decides to want to stop.  . Patient self care activities - Over the next 180 days, patient will: o Continue current medications and discuss with Dr. Gessner foCarlean Purler management.   Low testosterone  . Pharmacist Clinical Goal(s) o Over the next 60 days, patient will work with PharmD and providers to maintain testosterone levels at or above 300.  . Current regimen:  o Testosterone 1% gel, apply 25mcg ever22mrning  . Patient self care activities o Patient will return for lab visit on 8/16.   Back pain . Pharmacist Clinical Goal(s) o Over the next 180 days, patient will work with PharmD and providers to minimize back pain . Current regimen:  o Tizanidine 2mg, 1 tabl32mevery eight hours as needed for muscle spasms . Patient self care activities o Patient will continue current medications as instructed.   Medication management . Pharmacist Clinical Goal(s): o Over the next 180 days, patient will work with PharmD and providers to maintain optimal medication adherence . Current pharmacy: [facility] . Interventions o Comprehensive medication review performed. o Continue current medication management strategy . Patient self care activities - Over the next 180 days,  patient will: o Take medications as prescribed o Report any questions or concerns to PharmD and/or provider(s)  Initial goal documentation        Mr. Fessel was given information about Chronic Care Management services today including:  1. CCM service includes personalized support from designated clinical staff supervised by his physician, including  individualized plan of care and coordination with other care providers 2. 24/7 contact phone numbers for assistance for urgent and routine care needs. 3. Standard insurance, coinsurance, copays and deductibles apply for chronic care management only during months in which we provide at least 20 minutes of these services. Most insurances cover these services at 100%, however patients may be responsible for any copay, coinsurance and/or deductible if applicable. This service may help you avoid the need for more expensive face-to-face services. 4. Only one practitioner may furnish and bill the service in a calendar month. 5. The patient may stop CCM services at any time (effective at the end of the month) by phone call to the office staff.  Patient agreed to services and verbal consent obtained.   The patient verbalized understanding of instructions provided today and agreed to receive a mailed copy of patient instruction and/or educational materials. Telephone follow up appointment with pharmacy team member scheduled for: 10/19/2020  Anson Crofts, PharmD Clinical Pharmacist [facility] [place] [phone]   Diabetes Mellitus and Nutrition, Adult When you have diabetes (diabetes mellitus), it is very important to have healthy eating habits because your blood sugar (glucose) levels are greatly affected by what you eat and drink. Eating healthy foods in the appropriate amounts, at about the same times every day, can help you:  Control your blood glucose.  Lower your risk of heart disease.  Improve your blood pressure.  Reach or maintain a healthy weight. Every person with diabetes is different, and each person has different needs for a meal plan. Your health care provider may recommend that you work with a diet and nutrition specialist (dietitian) to make a meal plan that is best for you. Your meal plan may vary depending on factors such as:  The calories you  need.  The medicines you take.  Your weight.  Your blood glucose, blood pressure, and cholesterol levels.  Your activity level.  Other health conditions you have, such as heart or kidney disease. How do carbohydrates affect me? Carbohydrates, also called carbs, affect your blood glucose level more than any other type of food. Eating carbs naturally raises the amount of glucose in your blood. Carb counting is a method for keeping track of how many carbs you eat. Counting carbs is important to keep your blood glucose at a healthy level, especially if you use insulin or take certain oral diabetes medicines. It is important to know how many carbs you can safely have in each meal. This is different for every person. Your dietitian can help you calculate how many carbs you should have at each meal and for each snack. Foods that contain carbs include:  Bread, cereal, rice, pasta, and crackers.  Potatoes and corn.  Peas, beans, and lentils.  Milk and yogurt.  Fruit and juice.  Desserts, such as cakes, cookies, ice cream, and candy. How does alcohol affect me? Alcohol can cause a sudden decrease in blood glucose (hypoglycemia), especially if you use insulin or take certain oral diabetes medicines. Hypoglycemia can be a life-threatening condition. Symptoms of hypoglycemia (sleepiness, dizziness, and confusion) are similar to symptoms of having too much alcohol.  If your health care provider says that alcohol is safe for you, follow these guidelines:  Limit alcohol intake to no more than 1 drink per day for nonpregnant women and 2 drinks per day for men. One drink equals 12 oz of beer, 5 oz of wine, or 1 oz of hard liquor.  Do not drink on an empty stomach.  Keep yourself hydrated with water, diet soda, or unsweetened iced tea.  Keep in mind that regular soda, juice, and other mixers may contain a lot of sugar and must be counted as carbs. What are tips for following this plan?  Reading  food labels  Start by checking the serving size on the "Nutrition Facts" label of packaged foods and drinks. The amount of calories, carbs, fats, and other nutrients listed on the label is based on one serving of the item. Many items contain more than one serving per package.  Check the total grams (g) of carbs in one serving. You can calculate the number of servings of carbs in one serving by dividing the total carbs by 15. For example, if a food has 30 g of total carbs, it would be equal to 2 servings of carbs.  Check the number of grams (g) of saturated and trans fats in one serving. Choose foods that have low or no amount of these fats.  Check the number of milligrams (mg) of salt (sodium) in one serving. Most people should limit total sodium intake to less than 2,300 mg per day.  Always check the nutrition information of foods labeled as "low-fat" or "nonfat". These foods may be higher in added sugar or refined carbs and should be avoided.  Talk to your dietitian to identify your daily goals for nutrients listed on the label. Shopping  Avoid buying canned, premade, or processed foods. These foods tend to be high in fat, sodium, and added sugar.  Shop around the outside edge of the grocery store. This includes fresh fruits and vegetables, bulk grains, fresh meats, and fresh dairy. Cooking  Use low-heat cooking methods, such as baking, instead of high-heat cooking methods like deep frying.  Cook using healthy oils, such as olive, canola, or sunflower oil.  Avoid cooking with butter, cream, or high-fat meats. Meal planning  Eat meals and snacks regularly, preferably at the same times every day. Avoid going long periods of time without eating.  Eat foods high in fiber, such as fresh fruits, vegetables, beans, and whole grains. Talk to your dietitian about how many servings of carbs you can eat at each meal.  Eat 4-6 ounces (oz) of lean protein each day, such as lean meat, chicken,  fish, eggs, or tofu. One oz of lean protein is equal to: ? 1 oz of meat, chicken, or fish. ? 1 egg. ?  cup of tofu.  Eat some foods each day that contain healthy fats, such as avocado, nuts, seeds, and fish. Lifestyle  Check your blood glucose regularly.  Exercise regularly as told by your health care provider. This may include: ? 150 minutes of moderate-intensity or vigorous-intensity exercise each week. This could be brisk walking, biking, or water aerobics. ? Stretching and doing strength exercises, such as yoga or weightlifting, at least 2 times a week.  Take medicines as told by your health care provider.  Do not use any products that contain nicotine or tobacco, such as cigarettes and e-cigarettes. If you need help quitting, ask your health care provider.  Work with a Social worker or diabetes educator  to identify strategies to manage stress and any emotional and social challenges. Questions to ask a health care provider  Do I need to meet with a diabetes educator?  Do I need to meet with a dietitian?  What number can I call if I have questions?  When are the best times to check my blood glucose? Where to find more information:  American Diabetes Association: diabetes.org  Academy of Nutrition and Dietetics: www.eatright.[employer] of Diabetes and Digestive and Kidney Diseases (NIH): [employer] Summary  A healthy meal plan will help you control your blood glucose and maintain a healthy lifestyle.  Working with a diet and nutrition specialist (dietitian) can help you make a meal plan that is best for you.  Keep in mind that carbohydrates (carbs) and alcohol have immediate effects on your blood glucose levels. It is important to count carbs and to use alcohol carefully. This information is not intended to replace advice given to you by your health care provider. Make sure you discuss any questions you have with your health care provider. Document  Revised: 10/10/2017 Document Reviewed: 12/02/2016 Elsevier Patient Education  2020 [employer].

## 2020-04-21 NOTE — Chronic Care Management (AMB) (Signed)
Chronic Care Management Pharmacy  Name: [name] [name]  MRN: [id number] DOB: 03-Oct-1951  Initial Questions: 1. Have you seen any other providers since your last visit? NA 2. Any changes in your medicines or health? No   Chief Complaint/ HPI  [name] [name],  69 y.o. , male presents for their Initial CCM visit with the clinical pharmacist via telephone due to COVID-19 Pandemic.  PCP : Isaac Bliss, Rayford Halsted, MD  Their chronic conditions include: HTN, HLD, CAD, DM, GERD, Tobacco Use, Back pain, Hypogonadism  Office Visits: 03/23/2020- Lelon Frohlich, MD- Patient presented for office visit for 3 month follow up. CBC to be checked and determine if testosterone is to restarted. No other major interventions. Patient to return in 4 months.   12/23/2019- Lelon Frohlich, MD- Patient presented for office visit for annual exam and wellness visit. Patient to obtain repeat testosterone levels and A1c. Patient not interested in smoking cessation. Patient to return in 1 year or sooner if needed.   Consult Visit: 02/25/2020- Cardiology- [name], MD- Patient presented for office visit for follow up of CAD, HTN, HLD. Patient is asymptomatic. Patient to continue aspirin, rosuvastatin, Repatha, Vascepa. Patient to return in 6 months.   02/15/2020- Endocrinology- [name], MD- Patient presented for office visit for DM follow up. Rybelsus stated.  Follow up visit in 3 months.   Medications: Outpatient Encounter Medications as of 04/21/2020  Medication Sig  . Ascorbic Acid (VITAMIN C) 1000 MG tablet Take 2,000 mg by mouth daily.  Marland Kitchen aspirin EC 81 MG tablet Take 1 tablet (81 mg total) by mouth daily.  Marland Kitchen atenolol (TENORMIN) 25 MG tablet TAKE 1 TABLET BY MOUTH EVERY DAY  . b complex vitamins tablet Take 1 tablet by mouth daily.  . Biotin 5 MG TABS Take by mouth.  . Cholecalciferol (VITAMIN D) 2000 UNITS tablet Take 2,000 Units by mouth daily.  . Coenzyme Q10 (CO Q 10) 100  MG CAPS Take 200 mg by mouth daily.   . Cyanocobalamin (VITAMIN B-12 PO) Take 1 tablet by mouth daily.  . dapagliflozin propanediol (FARXIGA) 5 MG TABS tablet Take 5 mg by mouth daily before breakfast.  . Evolocumab (REPATHA SURECLICK) 829 MG/ML SOAJ Inject 1 pen into the skin every 14 (fourteen) days.  Marland Kitchen FOLIC ACID PO Take 1 tablet by mouth daily.  . Ginkgo Biloba (GINKOBA PO) Take 1 tablet by mouth daily.  Marland Kitchen glucose blood (ONETOUCH VERIO) test strip 1 each by Other route daily. Use as instructed  . icosapent Ethyl (VASCEPA) 1 g capsule Take 2 capsules (2 g total) by mouth 2 (two) times daily.  Marland Kitchen lisinopril (ZESTRIL) 40 MG tablet Take 1 tablet (40 mg total) by mouth daily.  . Magnesium 400 MG CAPS Take 400 mg by mouth daily.   . metFORMIN (GLUCOPHAGE-XR) 500 MG 24 hr tablet Take 2 tablets (1,000 mg total) by mouth daily.  . Multiple Vitamin (MULTIVITAMIN) tablet Take 1 tablet by mouth daily.  . nitroGLYCERIN (NITROSTAT) 0.4 MG SL tablet Place 1 tablet (0.4 mg total) under the tongue every 5 (five) minutes as needed for chest pain.  . pantoprazole (PROTONIX) 40 MG tablet Take 1 tablet (40 mg total) by mouth daily. (Patient taking differently: Take 40 mg by mouth every other day. )  . rosuvastatin (CRESTOR) 5 MG tablet TAKE 1 TABLET (5 MG TOTAL) BY MOUTH DAILY AT 6 PM.  . Semaglutide (RYBELSUS) 3 MG TABS Take 3 mg by mouth daily.  . Testosterone 25  MG/2.5GM (1%) GEL SMARTSIG:25 Milligram(s) Topical Every Morning  . tiZANidine (ZANAFLEX) 2 MG tablet Take 1 tablet (2 mg total) by mouth every 8 (eight) hours as needed for muscle spasms.  . vitamin E 100 UNIT capsule Take 100 Units by mouth daily.  . Zinc 50 MG TABS Take 50 mg by mouth daily.  Marland Kitchen VITAMIN A PO Take 1 tablet by mouth every other day. (Patient not taking: Reported on 04/21/2020)   Facility-Administered Encounter Medications as of 04/21/2020  Medication  . regadenoson (LEXISCAN) injection SOLN 0.4 mg     Current  Diagnosis/Assessment:  Goals Addressed            This Visit's Progress   . Pharmacy Care Plan       CARE PLAN ENTRY  Current Barriers:  . Chronic Disease Management support, education, and care coordination needs related to Hypertension, Hyperlipidemia, Diabetes, Coronary Artery Disease, GERD, Tobacco use, and Back pain, low testosterone   Hypertension . Pharmacist Clinical Goal(s): o Over the next 180 days, patient will work with PharmD and providers to maintain BP goal <130/80 . Current regimen:   Atenolol (Tenormin) 80m, 1 tablet once daily   Lisinopril 415m 1 tablet once daily . Patient self care activities - Over the next 180 days, patient will: o Ensure daily salt intake < 2300 mg/day  Hyperlipidemia/ Coronary atherosclerosis  . Pharmacist Clinical Goal(s): o Over the next 180 days, patient will work with PharmD and providers to maintain LDL goal < 70 . Current regimen:   evolocumab (Repatha) 14061mml, inject 1 pen into skin every fourteen days  icosapent ethyl (Vascepa) 1g, take 2 capsules twice daily   Rosuvastatin 5mg10m tablet once daily at 6pm  Nitroglycerin 0.4mg 17m place 1 tablet under tongue every 5 minutes as needed for chest pain  Aspirin 81mg,42mblet once daily  . Patient self care activities - Over the next 180 days, patient will: o Continue current medications as directed.   Diabetes . Pharmacist Clinical Goal(s): o Over the next 180 days, patient will work with PharmD and providers to achieve A1c goal <7% . Current regimen:   dapagliflozin (Farxiga) 5mg, 125mblet once daily before breakfast  Metformin ER 500mg, 256mlet once daily  semaglutide (Rybelsus) 3mg, 1 t62met once daily  . Interventions: o We discussed if checking blood sugars later in the day, check at least 2 hours after meal.  . Patient self care activities - Over the next 180 days, patient will: o Check blood sugar as instructed, document, and provide at future  appointments o Contact provider with any episodes of hypoglycemia  GERD (acid reflux)  . Pharmacist Clinical Goal(s) o Over the next 180 days, patient will work with PharmD and providers to minimize reflux symptoms  . Current regimen:  o Pantoprazole 40mg, 1 t58mt once daily  . Interventions: o Complications of long-term PPIs (pantoprazole) and to avoid abrupt discontinuation if decides to want to stop.  . Patient self care activities - Over the next 180 days, patient will: o Continue current medications and discuss with Dr. Gessner foCarlean Purler management.   Low testosterone  . Pharmacist Clinical Goal(s) o Over the next 60 days, patient will work with PharmD and providers to maintain testosterone levels at or above 300.  . Current regimen:  o Testosterone 1% gel, apply 25mcg ever69mrning  . Patient self care activities o Patient will return for lab visit on 8/16.   Back pain . Pharmacist Clinical Goal(s) o Over  the next 180 days, patient will work with PharmD and providers to minimize back pain . Current regimen:  o Tizanidine 61m, 1 tablet every eight hours as needed for muscle spasms . Patient self care activities o Patient will continue current medications as instructed.   Medication management . Pharmacist Clinical Goal(s): o Over the next 180 days, patient will work with PharmD and providers to maintain optimal medication adherence . Current [facility] . Interventions o Comprehensive medication review performed. o Continue current medication management strategy . Patient self care activities - Over the next 180 days, patient will: o Take medications as prescribed o Report any questions or concerns to PharmD and/or provider(s)  Initial goal documentation       SDOH Interventions     Most Recent Value  SDOH Interventions  Financial Strain Interventions Intervention Not Indicated  Transportation Interventions Intervention Not Indicated       Diabetes    Recent Relevant Labs: Lab Results  Component Value Date/Time   HGBA1C 7.2 (A) 02/15/2020 01:42 PM   HGBA1C 7.4 (H) 12/23/2019 02:44 PM   HGBA1C 6.7 (A) 08/10/2019 02:21 PM   HGBA1C 6.3 02/08/2019 02:12 PM   HGBA1C 6.3 12/16/2018 12:46 PM   MICROALBUR 0.9 11/19/2016 04:01 PM   MICROALBUR 2.4 (H) 10/18/2015 02:19 PM    Checking BG: could not confirm how often. Patient reported "every now and then"   Recent BG Readings: 98- 150 (patient reports checking through out day- could not confirm FBG vs PPBG)  Patient has failed these meds in past: Invokana ([employer]formulary)  Patient is currently uncontrolled on the following medications:   dapagliflozin (Farxiga) 570m 1 tablet once daily before breakfast  Metformin ER 50077m2 tablet once daily  semaglutide (Rybelsus) 3mg42m tablet once daily   Last diabetic Eye exam:  Lab Results  Component Value Date/Time   HMDIABEYEEXA No Retinopathy 07/26/2019 12:00 AM    Last diabetic Foot exam: No results found for: HMDIABFOOTEX   We discussed: appropriate blood glucose monitoring   Plan Continue current medications   Hypertension  Denies dizziness/ lightheadedness/ orthostatic hypotension  BP today is:  <130/80  Office blood pressures are  BP Readings from Last 3 Encounters:  03/23/20 110/70  02/25/20 126/80  02/15/20 128/82   Patient has failed these meds in the past: hydrochlorothiazide   Patient checks BP at home: patient reports not checking at home.   Patient home BP readings are ranging: NA   Patient is controlled on:   Atenolol (Tenormin) 25mg21mtablet once daily (night)   Lisinopril 40mg,66mablet once daily (night)   Plan Continue current medications   Hyperlipidemia   Lipid Panel     Component Value Date/Time   CHOL 106 12/23/2019 1444   CHOL 167 09/29/2019 1341   TRIG 170.0 (H) 12/23/2019 1444   HDL 51.20 12/23/2019 1444   HDL 46 09/29/2019 1341   LDLCALC 21 12/23/2019 1444   LDLCALC 71  09/29/2019 1341   LDLDIRECT 118 (H) 08/23/2019 1552   LDLDIRECT 125.0 12/16/2018 1246     The ASCVD Risk score (Goff DC Jr., et al., 2013) failed to calculate for the following reasons:   The valid total cholesterol range is 130 to 320 mg/dL   Patient has failed these meds in past: atorvastatin, simvastatin  (myalgia)  Patient is currently controlled on the following medications:   evolocumab (Repatha) 140mg/ 23minject 1 pen into skin every fourteen days  icosapent ethyl (Vascepa) 1g, take 2 capsules  twice daily   Rosuvastatin 108m, 1 tablet once daily at 6Eastovercurrent medications  Coronary atherosclerosis   Reports never needing to use nitroglycerin. He reports refilling recently due to having expired medication   Patient is currently controlled on the following medications:  . Aspirin 823m1 tablet once daily   evolocumab (Repatha) 14073mml, inject 1 pen into skin every fourteen days  Nitroglycerin 0.4mg89m, place 1 tablet under tongue every 5 minutes as needed for chest pain  icosapent ethyl (Vascepa) 1g, take 2 capsule twice daily   Rosuvastatin 5mg,61mtablet once daily at 6pm  Plan Continue current medications  Tobacco Abuse   Tobacco Status:  Social History   Tobacco Use  Smoking Status Current Every Day Smoker  . Packs/day: 1.00  . Years: 48.00  . Pack years: 48.00  . Types: Cigarettes  Smokeless Tobacco Never Used  Tobacco Comment   states he wants to reduce his dependence.   Did not assess.   Plan Will focus at next follow up call.    GERD  Reports taking every other day due to not having symptoms   Patient has failed these meds in past: none   Patient is currently controlled on the following medications:  . Pantoprazole 40mg,71mablet once daily   We discussed:  - long-term complications of PPI and avoid abrupt discontinuation.   Plan Continue current medications   Low testosterone   Testosterone  Date Value Ref Range  Status  12/23/2019 276.95 (L) 300.00 - 890.00 ng/dL Final   Hemoglobin & Hematocrit     Component Value Date/Time   HGB 16.3 03/23/2020 1532   HGB 11.8 (L) 02/02/2018 1652   HCT 49.0 03/23/2020 1532   HCT 38.8 02/02/2018 1652    Patient is currently controlled on the following medications:  . Testosterone 1% gel, apply 25mcg 25my morning   Plan Repeat CBC on 8/16.  Continue current medications   Back pain   Patient reports rarely taking it, but has on hand if needed.  Patient is currently controlled on the following medications:  . Tizanidine 2mg, 1 4mlet every eight hours as needed for muscle spasms  Plan Continue current medications.   OTC/ supplements   Patient is currently on the following medications:   Vitamin C 1000mg, 2036m once21mly  Vitmain B complex, 1 tablet once daily  Cholecalciferol (vitamin D) 2000 units, 1 tablet once daily  Cyanocobalamin (vitamin B12), 1 tablet once daily   Folic acid, 1 tablet once daily  Ginkgo biloba, 1 tablet once daily   Multivitamin, 1 tablet once daily   Vitamin A, 1 tablet every other day   Vitamin E 100 units, 1 capsule once daily  Zinc 50mg, 1 ta1m once daily   Biotin 500mg once d66m  We discussed: duplication of multi-vitamin and separate vitamins. Patient reports wanting to add additional dose to day intake of vitamins.   Plan Continue current medications. Will readdress at follow up.    Medication Management  Patient organizes medications: patient uses weekly pill box [facility]  Adherence:  - rosuvastatin 5mg, 12/20/275m, 04/01/20201, 90DS (patient did not know to continue when starting Repatha).     Follow up Follow up visit with PharmD in 6 months  Shironda Kain De SaAnson Croftsical Pharmacist [place] [facility] [place] ([phone]

## 2020-05-03 ENCOUNTER — Other Ambulatory Visit: Payer: Self-pay | Admitting: Internal Medicine

## 2020-05-03 DIAGNOSIS — R7989 Other specified abnormal findings of blood chemistry: Secondary | ICD-10-CM

## 2020-05-07 ENCOUNTER — Other Ambulatory Visit: Payer: Self-pay | Admitting: Cardiology

## 2020-05-16 ENCOUNTER — Encounter: Payer: Self-pay | Admitting: Endocrinology

## 2020-05-16 ENCOUNTER — Ambulatory Visit (INDEPENDENT_AMBULATORY_CARE_PROVIDER_SITE_OTHER): Payer: Medicare Other | Admitting: Endocrinology

## 2020-05-16 ENCOUNTER — Other Ambulatory Visit: Payer: Self-pay

## 2020-05-16 VITALS — BP 140/80 | HR 70 | Ht 67.0 in | Wt 203.0 lb

## 2020-05-16 DIAGNOSIS — E119 Type 2 diabetes mellitus without complications: Secondary | ICD-10-CM

## 2020-05-16 LAB — POCT GLYCOSYLATED HEMOGLOBIN (HGB A1C): Hemoglobin A1C: 6.7 % — AB (ref 4.0–5.6)

## 2020-05-16 MED ORDER — RYBELSUS 7 MG PO TABS: 7.0000 mg | ORAL_TABLET | Freq: Every day | ORAL | 11 refills | Status: DC

## 2020-05-16 NOTE — Progress Notes (Signed)
Subjective:    Patient ID: [name] [name], male    DOB: 04-24-1951, 69 y.o.   MRN: [id number]  HPI Pt returns for f/u of DM. DM type: 2 Dx'ed: 6644 Complications: CAD Therapy: 3 oral meds DKA: never Severe hypoglycemia: never.  Pancreatitis: never Other: he has never been on insulin; pioglitizone was favored due to NASH, but he did not tolerate (weight gain).   Interval history: he says cbg's are well-controlled.  pt states he feels well in general.   Past Medical History:  Diagnosis Date  . Anemia, iron deficiency 11/18/2008   Resolved, despite no rx--2012    . ANEMIA-IRON DEFICIENCY   . Arthritis   . BACK PAIN, LUMBAR   . CORONARY ARTERY DISEASE   . DIVERTICULOSIS, COLON   . DM    on no medication ?prediabetes  . Elevated LFTs 11/18/2015  . GERD   . Hepatitis    States he was tested for Hep B and he had antibodies  . HYPERLIPIDEMIA   . HYPERTENSION    dr tom wall  . Nephrolithiasis   . PONV (postoperative nausea and vomiting)    little nausea after having his hip replacement  . Unspecified disorder of urethra and urinary tract     Past Surgical History:  Procedure Laterality Date  . COLONOSCOPY    . CORONARY ANGIOPLASTY WITH STENT PLACEMENT     06    dr t. wall  . ELECTROCARDIOGRAM  03/30/2007  . ESOPHAGOGASTRODUODENOSCOPY  11/07/2005  . kidney stones    . LUMBAR LAMINECTOMY Right 05/18/2014   Procedure: LUMBAR LAMINECTOMY Microdiscectomy Right Lumbar five - sacral one;  Surgeon: [name], MD;  Location: [facility];  Service: Orthopedics;  Laterality: Right;  . Stress Cardiolite  11/30/2004  . TOTAL HIP ARTHROPLASTY  10/28/2012   Procedure: TOTAL HIP ARTHROPLASTY;  Surgeon: [name], MD;  Location: [facility];  Service: Orthopedics;  Laterality: Right;    Social History   Socioeconomic History  . Marital status: Single    Spouse name: Not on file  . Number of children: 0  . Years of education: Not on file  . Highest education level: Not on file    Occupational History  . 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 Occupation: Retired  Tobacco Use  . Smoking status: Current Every Day Smoker    Packs/day: 1.00    Years: 48.00    Pack years: 48.00    Types: Cigarettes  . Smokeless tobacco: Never Used  . Tobacco comment: states he wants to reduce his dependence.  Vaping Use  . Vaping Use: Never used  Substance and Sexual Activity  . Alcohol use: Yes    Alcohol/week: 0.0 standard drinks    Comment: rare  . Drug use: No  . Sexual activity: Not Currently  Other Topics Concern  . Not on file  Social History Narrative   Originally from [place], [place]   Lives alone with dog, has a couple of friends he can rely on for support.    Social Determinants of Health   Financial Resource Strain: Low Risk   . Difficulty of Paying Living Expenses: Not hard at all  Food Insecurity:   . Worried About Charity fundraiser in the Last Year:   . Arboriculturist in the Last Year:   Transportation Needs: No Transportation Needs  . Lack of Transportation (Medical): No  .  Lack of Transportation (Non-Medical): No  Physical Activity:   . Days of Exercise per Week:   . Minutes of Exercise per Session:   Stress:   . Feeling of Stress :   Social Connections:   . Frequency of Communication with Friends and Family:   . Frequency of Social Gatherings with Friends and Family:   . Attends Religious Services:   . Active Member of Clubs or Organizations:   . Attends Archivist Meetings:   Marland Kitchen Marital Status:   Intimate Partner Violence:   . Fear of Current or Ex-Partner:   . Emotionally Abused:   Marland Kitchen Physically Abused:   . Sexually Abused:     Current Outpatient Medications on File Prior to Visit  Medication Sig Dispense Refill  . Ascorbic Acid (VITAMIN C) 1000 MG tablet Take 2,000 mg by mouth daily.    Marland Kitchen aspirin EC 81 MG tablet Take 1 tablet (81 mg total) by mouth daily. 30 tablet 11  .  atenolol (TENORMIN) 25 MG tablet TAKE 1 TABLET BY MOUTH EVERY DAY 90 tablet 2  . b complex vitamins tablet Take 1 tablet by mouth daily.    Marland Kitchen BIOTIN PO Take by mouth. 542mg once daily    . Cholecalciferol (VITAMIN D) 2000 UNITS tablet Take 2,000 Units by mouth daily.    . Coenzyme Q10 (CO Q 10) 100 MG CAPS Take 200 mg by mouth daily.     . Cyanocobalamin (VITAMIN B-12 PO) Take 1 tablet by mouth daily.    . dapagliflozin propanediol (FARXIGA) 5 MG TABS tablet Take 5 mg by mouth daily before breakfast. 90 tablet 3  . FOLIC ACID PO Take 1 tablet by mouth daily.    . Ginkgo Biloba (GINKOBA PO) Take 1 tablet by mouth daily.    .Marland Kitchenglucose blood (ONETOUCH VERIO) test strip 1 each by Other route daily. Use as instructed 90 each 3  . icosapent Ethyl (VASCEPA) 1 g capsule Take 2 capsules (2 g total) by mouth 2 (two) times daily. 360 capsule 3  . lisinopril (ZESTRIL) 40 MG tablet Take 1 tablet (40 mg total) by mouth daily. 90 tablet 3  . Magnesium 400 MG CAPS Take 400 mg by mouth daily.     . metFORMIN (GLUCOPHAGE-XR) 500 MG 24 hr tablet Take 2 tablets (1,000 mg total) by mouth daily. 180 tablet 3  . Multiple Vitamin (MULTIVITAMIN) tablet Take 1 tablet by mouth daily.    . nitroGLYCERIN (NITROSTAT) 0.4 MG SL tablet Place 1 tablet (0.4 mg total) under the tongue every 5 (five) minutes as needed for chest pain. 30 tablet 11  . pantoprazole (PROTONIX) 40 MG tablet Take 1 tablet (40 mg total) by mouth daily. (Patient taking differently: Take 40 mg by mouth every other day. ) 30 tablet 11  . REPATHA SURECLICK 1440MG/ML SOAJ INJECT 1 PEN INTO THE SKIN EVERY 14 (FOURTEEN) DAYS. 6 pen 3  . Testosterone 25 MG/2.5GM (1%) GEL PLACE 25 MG ONTO THE SKIN EVERY MORNING. 2.5 g 1  . tiZANidine (ZANAFLEX) 2 MG tablet Take 1 tablet (2 mg total) by mouth every 8 (eight) hours as needed for muscle spasms. 30 tablet 0  . VITAMIN A PO Take 1 tablet by mouth every other day.     . vitamin E 100 UNIT capsule Take 100 Units by  mouth daily.    . Zinc 50 MG TABS Take 50 mg by mouth daily.     Current Facility-Administered Medications on File Prior to  Visit  Medication Dose Route Frequency Provider Last Rate Last Admin  . regadenoson (LEXISCAN) injection SOLN 0.4 mg  0.4 mg Intravenous Once Donato Heinz, MD        Allergies  Allergen Reactions  . Actos [Pioglitazone Hydrochloride]     Weight gain    Family History  Problem Relation Age of Onset  . Colon cancer Mother        Colon Cancer-at advanced age  . Heart disease Father   . Breast cancer Sister     BP 140/80   Pulse 70   Ht 5' 7"  (1.702 m)   Wt 203 lb (92.1 kg)   SpO2 95%   BMI 31.79 kg/m    Review of Systems He has lost a few lbs, due to his efforts.  Denies nausea.     Objective:   Physical Exam VITAL SIGNS:  See vs page GENERAL: no distress Pulses: dorsalis pedis intact bilat.   MSK: no deformity of the feet CV: no leg edema Skin:  no ulcer on the feet.  normal color and temp on the feet.  Neuro: sensation is intact to touch on the feet.    Lab Results  Component Value Date   HGBA1C 6.7 (A) 05/16/2020        Assessment & Plan:  Type 2 DM: he would benefit from increased rx, if it can be done with a regimen that avoids or minimizes hypoglycemia. HTN: recheck next time  Patient Instructions  I have sent a prescription to your pharmacy, to increase the Rybelsus.  You can use up the 3 mg pills by taking 2 pills per day. Please continue the same other medications. check your blood sugar once a day.  vary the time of day when you check, between before the 3 meals, and at bedtime.  also check if you have symptoms of your blood sugar being too high or too low.  please keep a record of the readings and bring it to your next appointment here (or you can bring the meter itself).  You can write it on any piece of paper.  please call us sooner if your blood sugar goes below 70, or if you have a lot of readings over 200. Please  come back for a follow-up appointment in 4 months.

## 2020-05-16 NOTE — Patient Instructions (Addendum)
I have sent a prescription to your pharmacy, to increase the Rybelsus.  You can use up the 3 mg pills by taking 2 pills per day. Please continue the same other medications. check your blood sugar once a day.  vary the time of day when you check, between before the 3 meals, and at bedtime.  also check if you have symptoms of your blood sugar being too high or too low.  please keep a record of the readings and bring it to your next appointment here (or you can bring the meter itself).  You can write it on any piece of paper.  please call us sooner if your blood sugar goes below 70, or if you have a lot of readings over 200. Please come back for a follow-up appointment in 4 months.

## 2020-05-18 ENCOUNTER — Other Ambulatory Visit: Payer: Self-pay | Admitting: Internal Medicine

## 2020-05-18 DIAGNOSIS — E785 Hyperlipidemia, unspecified: Secondary | ICD-10-CM

## 2020-05-29 NOTE — Addendum Note (Signed)
Addended by: Marrion Coy on: 05/29/2020 04:22 PM   Modules accepted: Orders

## 2020-06-26 ENCOUNTER — Other Ambulatory Visit: Payer: Self-pay

## 2020-07-10 ENCOUNTER — Other Ambulatory Visit: Payer: Self-pay | Admitting: Internal Medicine

## 2020-07-24 ENCOUNTER — Other Ambulatory Visit: Payer: Medicare Other

## 2020-07-24 ENCOUNTER — Ambulatory Visit (INDEPENDENT_AMBULATORY_CARE_PROVIDER_SITE_OTHER): Payer: Medicare Other | Admitting: *Deleted

## 2020-07-24 ENCOUNTER — Other Ambulatory Visit: Payer: Self-pay

## 2020-07-24 DIAGNOSIS — E291 Testicular hypofunction: Secondary | ICD-10-CM

## 2020-07-24 DIAGNOSIS — Z23 Encounter for immunization: Secondary | ICD-10-CM

## 2020-07-25 ENCOUNTER — Other Ambulatory Visit: Payer: Self-pay | Admitting: Internal Medicine

## 2020-07-25 DIAGNOSIS — E119 Type 2 diabetes mellitus without complications: Secondary | ICD-10-CM | POA: Diagnosis not present

## 2020-07-25 DIAGNOSIS — R7989 Other specified abnormal findings of blood chemistry: Secondary | ICD-10-CM

## 2020-07-25 LAB — CBC WITH DIFFERENTIAL/PLATELET
Absolute Monocytes: 681 cells/uL (ref 200–950)
Basophils Absolute: 83 cells/uL (ref 0–200)
Basophils Relative: 1 %
Eosinophils Absolute: 158 cells/uL (ref 15–500)
Eosinophils Relative: 1.9 %
HCT: 45.9 % (ref 38.5–50.0)
Hemoglobin: 15.3 g/dL (ref 13.2–17.1)
Lymphs Abs: 1876 cells/uL (ref 850–3900)
MCH: 30.5 pg (ref 27.0–33.0)
MCHC: 33.3 g/dL (ref 32.0–36.0)
MCV: 91.6 fL (ref 80.0–100.0)
MPV: 9.7 fL (ref 7.5–12.5)
Monocytes Relative: 8.2 %
Neutro Abs: 5503 cells/uL (ref 1500–7800)
Neutrophils Relative %: 66.3 %
Platelets: 220 10*3/uL (ref 140–400)
RBC: 5.01 10*6/uL (ref 4.20–5.80)
RDW: 13.6 % (ref 11.0–15.0)
Total Lymphocyte: 22.6 %
WBC: 8.3 10*3/uL (ref 3.8–10.8)

## 2020-07-25 LAB — HM DIABETES EYE EXAM

## 2020-07-26 ENCOUNTER — Other Ambulatory Visit: Payer: Self-pay

## 2020-07-27 ENCOUNTER — Other Ambulatory Visit (INDEPENDENT_AMBULATORY_CARE_PROVIDER_SITE_OTHER): Payer: Medicare Other

## 2020-07-27 DIAGNOSIS — R7989 Other specified abnormal findings of blood chemistry: Secondary | ICD-10-CM

## 2020-08-01 ENCOUNTER — Other Ambulatory Visit: Payer: Self-pay | Admitting: Internal Medicine

## 2020-08-01 DIAGNOSIS — R7989 Other specified abnormal findings of blood chemistry: Secondary | ICD-10-CM

## 2020-08-01 LAB — TESTOSTERONE, FREE: TESTOSTERONE FREE: 39.4 pg/mL — ABNORMAL LOW (ref 46.0–224.0)

## 2020-08-01 MED ORDER — TESTOSTERONE 25 MG/2.5GM (1%) TD GEL: 50.0000 mg | Freq: Every day | TRANSDERMAL | 3 refills | Status: DC

## 2020-08-13 ENCOUNTER — Other Ambulatory Visit: Payer: Self-pay | Admitting: Cardiology

## 2020-09-08 ENCOUNTER — Ambulatory Visit: Payer: Medicare Other | Admitting: Cardiology

## 2020-09-11 ENCOUNTER — Ambulatory Visit (INDEPENDENT_AMBULATORY_CARE_PROVIDER_SITE_OTHER): Payer: Medicare Other | Admitting: Cardiology

## 2020-09-11 ENCOUNTER — Encounter: Payer: Self-pay | Admitting: Cardiology

## 2020-09-11 ENCOUNTER — Other Ambulatory Visit: Payer: Self-pay

## 2020-09-11 VITALS — BP 126/78 | HR 75 | Ht 67.0 in | Wt 196.4 lb

## 2020-09-11 DIAGNOSIS — Z9861 Coronary angioplasty status: Secondary | ICD-10-CM | POA: Diagnosis not present

## 2020-09-11 DIAGNOSIS — E119 Type 2 diabetes mellitus without complications: Secondary | ICD-10-CM | POA: Diagnosis not present

## 2020-09-11 DIAGNOSIS — I2510 Atherosclerotic heart disease of native coronary artery without angina pectoris: Secondary | ICD-10-CM | POA: Diagnosis not present

## 2020-09-11 DIAGNOSIS — E785 Hyperlipidemia, unspecified: Secondary | ICD-10-CM

## 2020-09-11 DIAGNOSIS — I2583 Coronary atherosclerosis due to lipid rich plaque: Secondary | ICD-10-CM | POA: Diagnosis not present

## 2020-09-11 MED ORDER — DAPAGLIFLOZIN PROPANEDIOL 5 MG PO TABS: 5.0000 mg | ORAL_TABLET | Freq: Every day | ORAL | 3 refills | Status: DC

## 2020-09-11 NOTE — Progress Notes (Signed)
Cardiology Office Note:    Date:  09/11/2020   ID:  CRISTAL HOWATT, DOB 01-May-1951, MRN [id number]  PCP:  Isaac Bliss, Rayford Halsted, MD  Cardiologist:  No primary care provider on file.  Electrophysiologist:  None   Referring MD: Isaac Bliss, [name]*   Reason for visit: 6 months follow-up  History of Present Illness:    AADVIK ROKER is a 69 y.o. male with a hx of coronary artery disease, s/p  coronary stenting after a positive stress test in 2006. Off Plavix since March 2015. A nuclear stress test in Dec 2015 was normal, normal LVEF as well. He has been experiencing myalgias with different statins also LFTs elevation with a very low-dose of statin such as 5 mg of rosuvastatin daily.  He has been seen in our lipid clinic and approved for [place] as well as Vascepa that he is tolerating well.  He is lipids have improved significantly.    The patient remains active, he is tolerating his medications very well.  And is compliant.  He has lost 10 pounds in the last few months, he feels great, his only complaint is atypical nonexertional left-sided chest wall/epigastric pain that only happens at rest.  It resolves with burping.   Past Medical History:  Diagnosis Date  . Anemia, iron deficiency 11/18/2008   Resolved, despite no rx--2012    . ANEMIA-IRON DEFICIENCY   . Arthritis   . BACK PAIN, LUMBAR   . CORONARY ARTERY DISEASE   . DIVERTICULOSIS, COLON   . DM    on no medication ?prediabetes  . Elevated LFTs 11/18/2015  . GERD   . Hepatitis    States he was tested for Hep B and he had antibodies  . HYPERLIPIDEMIA   . HYPERTENSION    dr tom wall  . Nephrolithiasis   . PONV (postoperative nausea and vomiting)    little nausea after having his hip replacement  . Unspecified disorder of urethra and urinary tract    Past Surgical History:  Procedure Laterality Date  . COLONOSCOPY    . CORONARY ANGIOPLASTY WITH STENT PLACEMENT     06    dr t. wall  . ELECTROCARDIOGRAM   03/30/2007  . ESOPHAGOGASTRODUODENOSCOPY  11/07/2005  . kidney stones    . LUMBAR LAMINECTOMY Right 05/18/2014   Procedure: LUMBAR LAMINECTOMY Microdiscectomy Right Lumbar five - sacral one;  Surgeon: [name], MD;  Location: [facility];  Service: Orthopedics;  Laterality: Right;  . Stress Cardiolite  11/30/2004  . TOTAL HIP ARTHROPLASTY  10/28/2012   Procedure: TOTAL HIP ARTHROPLASTY;  Surgeon: [name], MD;  Location: [facility];  Service: Orthopedics;  Laterality: Right;   Current Medications: Current Meds  Medication Sig  . Ascorbic Acid (VITAMIN C) 1000 MG tablet Take 2,000 mg by mouth daily.  Marland Kitchen aspirin EC 81 MG tablet Take 1 tablet (81 mg total) by mouth daily.  Marland Kitchen atenolol (TENORMIN) 25 MG tablet TAKE 1 TABLET BY MOUTH EVERY DAY  . b complex vitamins tablet Take 1 tablet by mouth daily.  Marland Kitchen BIOTIN PO Take by mouth. 531mg once daily  . Cholecalciferol (VITAMIN D) 2000 UNITS tablet Take 2,000 Units by mouth daily.  . Coenzyme Q10 (CO Q 10) 100 MG CAPS Take 200 mg by mouth daily.   . Cyanocobalamin (VITAMIN B-12 PO) Take 1 tablet by mouth daily.  . dapagliflozin propanediol (FARXIGA) 5 MG TABS tablet Take 1 tablet (5 mg total) by mouth daily before breakfast.  .  FOLIC ACID PO Take 1 tablet by mouth daily.  . Ginkgo Biloba (GINKOBA PO) Take 1 tablet by mouth daily.  Marland Kitchen glucose blood (ONETOUCH VERIO) test strip 1 each by Other route daily. Use as instructed  . icosapent Ethyl (VASCEPA) 1 g capsule Take 2 capsules (2 g total) by mouth 2 (two) times daily.  Marland Kitchen lisinopril (ZESTRIL) 40 MG tablet Take 1 tablet (40 mg total) by mouth daily.  . Magnesium 400 MG CAPS Take 400 mg by mouth daily.   . metFORMIN (GLUCOPHAGE-XR) 500 MG 24 hr tablet Take 2 tablets (1,000 mg total) by mouth daily.  . Multiple Vitamin (MULTIVITAMIN) tablet Take 1 tablet by mouth daily.  . nitroGLYCERIN (NITROSTAT) 0.4 MG SL tablet Place 1 tablet (0.4 mg total) under the tongue every 5 (five) minutes as needed for chest  pain.  Marland Kitchen REPATHA SURECLICK 790 MG/ML SOAJ INJECT 1 PEN INTO THE SKIN EVERY 14 (FOURTEEN) DAYS.  Marland Kitchen rosuvastatin (CRESTOR) 5 MG tablet TAKE 1 TABLET (5 MG TOTAL) BY MOUTH DAILY AT 6 PM.  . Semaglutide (RYBELSUS) 7 MG TABS Take 7 mg by mouth daily.  Marland Kitchen VITAMIN A PO Take 1 tablet by mouth every other day.   . vitamin E 100 UNIT capsule Take 100 Units by mouth daily.  . Zinc 50 MG TABS Take 50 mg by mouth daily.  . [DISCONTINUED] dapagliflozin propanediol (FARXIGA) 5 MG TABS tablet Take 5 mg by mouth daily before breakfast.    Allergies:   Actos [pioglitazone hydrochloride]   Social History   Socioeconomic History  . Marital status: Single    Spouse name: Not on file  . Number of children: 0  . Years of education: Not on file  . Highest education level: Not on file  Occupational History  . 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 Occupation: Retired  Tobacco Use  . Smoking status: Current Every Day Smoker    Packs/day: 1.00    Years: 48.00    Pack years: 48.00    Types: Cigarettes  . Smokeless tobacco: Never Used  . Tobacco comment: states he wants to reduce his dependence.  Vaping Use  . Vaping Use: Never used  Substance and Sexual Activity  . Alcohol use: Yes    Alcohol/week: 0.0 standard drinks    Comment: rare  . Drug use: No  . Sexual activity: Not Currently  Other Topics Concern  . Not on file  Social History Narrative   Originally from [place], [place]   Lives alone with dog, has a couple of friends he can rely on for support.    Social Determinants of Health   Financial Resource Strain: Low Risk   . Difficulty of Paying Living Expenses: Not hard at all  Food Insecurity:   . Worried About Charity fundraiser in the Last Year: Not on file  . Ran Out of Food in the Last Year: Not on file  Transportation Needs: No Transportation Needs  . Lack of Transportation (Medical): No  . Lack of Transportation (Non-Medical): No    Physical Activity:   . Days of Exercise per Week: Not on file  . Minutes of Exercise per Session: Not on file  Stress:   . Feeling of Stress : Not on file  Social Connections:   . Frequency of Communication with Friends and Family: Not on file  . Frequency of Social Gatherings with Friends and Family: Not on file  .  Attends Religious Services: Not on file  . Active Member of Clubs or Organizations: Not on file  . Attends Archivist Meetings: Not on file  . Marital Status: Not on file    Family History: The patient's family history includes Breast cancer in his sister; Colon cancer in his mother; Heart disease in his father.  ROS:   Please see the history of present illness.    All other systems reviewed and are negative.  EKGs/Labs/Other Studies Reviewed:    The following studies were reviewed today:  EKG:  EKG is ordered today.  The ekg ordered today demonstrates normal sinus rhythm, right bundle branch block, unchanged from prior.  This was personally reviewed.  Recent Labs: 12/23/2019: ALT 97; BUN 13; Creatinine, Ser 0.82; Potassium 4.3; Sodium 141; TSH 1.71 07/24/2020: Hemoglobin 15.3; Platelets 220   Recent Lipid Panel    Component Value Date/Time   CHOL 106 12/23/2019 1444   CHOL 167 09/29/2019 1341   TRIG 170.0 (H) 12/23/2019 1444   HDL 51.20 12/23/2019 1444   HDL 46 09/29/2019 1341   CHOLHDL 2 12/23/2019 1444   VLDL 34.0 12/23/2019 1444   LDLCALC 21 12/23/2019 1444   LDLCALC 71 09/29/2019 1341   LDLDIRECT 118 (H) 08/23/2019 1552   LDLDIRECT 125.0 12/16/2018 1246   Physical Exam:    VS:  BP 126/78   Pulse 75   Ht 5' 7"  (1.702 m)   Wt 196 lb 6.4 oz (89.1 kg)   SpO2 95%   BMI 30.76 kg/m     Wt Readings from Last 3 Encounters:  09/11/20 196 lb 6.4 oz (89.1 kg)  05/16/20 203 lb (92.1 kg)  03/23/20 205 lb 3.2 oz (93.1 kg)    GEN: Well nourished, well developed in no acute distress HEENT: Normal NECK: No JVD; No carotid bruits LYMPHATICS: No  lymphadenopathy CARDIAC: RRR, no murmurs, rubs, gallops RESPIRATORY:  Clear to auscultation without rales, wheezing or rhonchi  ABDOMEN: Soft, non-tender, non-distended MUSCULOSKELETAL:  No edema; No deformity  SKIN: Warm and dry NEUROLOGIC:  Alert and oriented x 3 PSYCHIATRIC:  Normal affect    ASSESSMENT:    1. Atherosclerosis of coronary artery without angina pectoris, unspecified vessel or lesion type, unspecified whether native or transplanted heart   2. Coronary artery disease due to lipid rich plaque   3. Dyslipidemia     PLAN:    In order of problems listed above:  CAD, s/p PCI (? Artery) in 2006, he had negative stress test in October 2020 with no ischemia or scar, normal LVEF 60 to 65%.  He has been tolerating Vascepa and Repatha.  His EKG is unchanged from prior.  His chest pain is very atypical, rather chest wall pain possibly related to GERD.  He is advised to follow-up with his GI physician he is due for colonoscopy and can request to have EGD done as well.  Hypertension -well-controlled on current regimen  Hyperlipidemia -chronic elevation of LFTs on low-dose of statins plus myalgias, he is now on Repatha, Vascepa and very low-dose of rosuvastatin that he is now tolerating, his lipids have improved significantly, triglycerides are down from 312 to 170.  LDL down from 108 to 21, HDL 51.  Smoking cessation   DM - uncontrolled, HbA1c 7.2%, recently started Farxiga, significant improvement of diabetes and weight loss of 10 pounds.  He is Advertising account executive.  We will obtain his labs today.  Medication Adjustments/Labs and Tests Ordered: Current medicines are reviewed at length with the patient  today.  Concerns regarding medicines are outlined above.  Orders Placed This Encounter  Procedures  . EKG 12-Lead   Meds ordered this encounter  Medications  . dapagliflozin propanediol (FARXIGA) 5 MG TABS tablet    Sig: Take 1 tablet (5 mg total) by mouth daily before breakfast.      Dispense:  90 tablet    Refill:  3    Patient Instructions  Medication Instructions:   Your physician recommends that you continue on your current medications as directed. Please refer to the Current Medication list given to you today.  *If you need a refill on your cardiac medications before your next appointment, please call your pharmacy*   Follow-Up: At [facility], you and your health needs are our priority.  As part of our continuing mission to provide you with exceptional heart care, we have created designated Provider Care Teams.  These Care Teams include your primary Cardiologist (physician) and Advanced Practice Providers (APPs -  Physician Assistants and Nurse Practitioners) who all work together to provide you with the care you need, when you need it.  We recommend signing up for the patient portal called "MyChart".  Sign up information is provided on this After Visit Summary.  MyChart is used to connect with patients for Virtual Visits (Telemedicine).  Patients are able to view lab/test results, encounter notes, upcoming appointments, etc.  Non-urgent messages can be sent to your provider as well.   To learn more about what you can do with MyChart, go to [email or web address].    Your next appointment:   6 month(s)  The format for your next appointment:   In Person  Provider:   [name], MD        Signed, [name], MD  09/11/2020 2:48 PM    [place]

## 2020-09-11 NOTE — Patient Instructions (Addendum)
Medication Instructions:   Your physician recommends that you continue on your current medications as directed. Please refer to the Current Medication list given to you today.  *If you need a refill on your cardiac medications before your next appointment, please call your pharmacy*   Your physician recommends that you return for lab work in: TODAY--CMET, CBC, TSH, HEMOGLOBIN A1C, AND LIPIDS   Follow-Up: At [facility], you and your health needs are our priority.  As part of our continuing mission to provide you with exceptional heart care, we have created designated Provider Care Teams.  These Care Teams include your primary Cardiologist (physician) and Advanced Practice Providers (APPs -  Physician Assistants and Nurse Practitioners) who all work together to provide you with the care you need, when you need it.  We recommend signing up for the patient portal called "MyChart".  Sign up information is provided on this After Visit Summary.  MyChart is used to connect with patients for Virtual Visits (Telemedicine).  Patients are able to view lab/test results, encounter notes, upcoming appointments, etc.  Non-urgent messages can be sent to your provider as well.   To learn more about what you can do with MyChart, go to [email or web address].    Your next appointment:   6 month(s)  The format for your next appointment:   In Person  Provider:   [name], MD

## 2020-09-12 LAB — COMPREHENSIVE METABOLIC PANEL WITH GFR
ALT: 28 IU/L (ref 0–44)
AST: 26 IU/L (ref 0–40)
Albumin/Globulin Ratio: 1.9 (ref 1.2–2.2)
Albumin: 4.6 g/dL (ref 3.8–4.8)
Alkaline Phosphatase: 61 IU/L (ref 44–121)
BUN/Creatinine Ratio: 22 (ref 10–24)
BUN: 16 mg/dL (ref 8–27)
Bilirubin Total: 0.2 mg/dL (ref 0.0–1.2)
CO2: 22 mmol/L (ref 20–29)
Calcium: 9.6 mg/dL (ref 8.6–10.2)
Chloride: 107 mmol/L — ABNORMAL HIGH (ref 96–106)
Creatinine, Ser: 0.72 mg/dL — ABNORMAL LOW (ref 0.76–1.27)
GFR calc Af Amer: 111 mL/min/1.73 (ref >59)
GFR calc non Af Amer: 96 mL/min/1.73 (ref >59)
Globulin, Total: 2.4 g/dL (ref 1.5–4.5)
Glucose: 118 mg/dL — ABNORMAL HIGH (ref 65–99)
Potassium: 4.7 mmol/L (ref 3.5–5.2)
Sodium: 145 mmol/L — ABNORMAL HIGH (ref 134–144)
Total Protein: 7 g/dL (ref 6.0–8.5)

## 2020-09-12 LAB — CBC
Hematocrit: 44.8 % (ref 37.5–51.0)
Hemoglobin: 14.7 g/dL (ref 13.0–17.7)
MCH: 28.8 pg (ref 26.6–33.0)
MCHC: 32.8 g/dL (ref 31.5–35.7)
MCV: 88 fL (ref 79–97)
Platelets: 273 10*3/uL (ref 150–450)
RBC: 5.11 x10E6/uL (ref 4.14–5.80)
RDW: 13.7 % (ref 11.6–15.4)
WBC: 8.3 10*3/uL (ref 3.4–10.8)

## 2020-09-12 LAB — TSH: TSH: 1.98 u[IU]/mL (ref 0.450–4.500)

## 2020-09-12 LAB — LIPID PANEL
Chol/HDL Ratio: 1.5 ratio (ref 0.0–5.0)
Cholesterol, Total: 86 mg/dL — ABNORMAL LOW (ref 100–199)
HDL: 57 mg/dL (ref >39)
LDL Chol Calc (NIH): 11 mg/dL (ref 0–99)
Triglycerides: 97 mg/dL (ref 0–149)
VLDL Cholesterol Cal: 18 mg/dL (ref 5–40)

## 2020-09-12 LAB — HEMOGLOBIN A1C
Est. average glucose Bld gHb Est-mCnc: 131 mg/dL
Hgb A1c MFr Bld: 6.2 % — ABNORMAL HIGH (ref 4.8–5.6)

## 2020-09-19 ENCOUNTER — Encounter: Payer: Self-pay | Admitting: Endocrinology

## 2020-09-19 ENCOUNTER — Other Ambulatory Visit: Payer: Self-pay

## 2020-09-19 ENCOUNTER — Ambulatory Visit (INDEPENDENT_AMBULATORY_CARE_PROVIDER_SITE_OTHER): Payer: Medicare Other | Admitting: Endocrinology

## 2020-09-19 VITALS — BP 128/76 | HR 72 | Ht 67.0 in | Wt 196.0 lb

## 2020-09-19 DIAGNOSIS — E119 Type 2 diabetes mellitus without complications: Secondary | ICD-10-CM | POA: Diagnosis not present

## 2020-09-19 MED ORDER — DAPAGLIFLOZIN PROPANEDIOL 10 MG PO TABS: 10.0000 mg | ORAL_TABLET | Freq: Every day | ORAL | 3 refills | Status: DC

## 2020-09-19 NOTE — Progress Notes (Signed)
Subjective:    Patient ID: [name] [name], male    DOB: [birth year], 69 y.o.   MRN: [id number]  HPI Pt returns for f/u of DM. DM type: 2 Dx'ed: 8144 Complications: CAD Therapy: 3 oral meds DKA: never Severe hypoglycemia: never.  Pancreatitis: never Other: he has never been on insulin; pioglitizone was favored due to NASH, but he did not tolerate (weight gain).    Interval history: he says cbg's are well-controlled.  pt states he feels well in general.   Past Medical History:  Diagnosis Date  . Anemia, iron deficiency 11/18/2008   Resolved, despite no rx--2012    . ANEMIA-IRON DEFICIENCY   . Arthritis   . BACK PAIN, LUMBAR   . CORONARY ARTERY DISEASE   . DIVERTICULOSIS, COLON   . DM    on no medication ?prediabetes  . Elevated LFTs 11/18/2015  . GERD   . Hepatitis    States he was tested for Hep B and he had antibodies  . HYPERLIPIDEMIA   . HYPERTENSION    dr tom wall  . Nephrolithiasis   . PONV (postoperative nausea and vomiting)    little nausea after having his hip replacement  . Unspecified disorder of urethra and urinary tract     Past Surgical History:  Procedure Laterality Date  . COLONOSCOPY    . CORONARY ANGIOPLASTY WITH STENT PLACEMENT     06    dr t. wall  . ELECTROCARDIOGRAM  03/30/2007  . ESOPHAGOGASTRODUODENOSCOPY  11/07/2005  . kidney stones    . LUMBAR LAMINECTOMY Right 05/18/2014   Procedure: LUMBAR LAMINECTOMY Microdiscectomy Right Lumbar five - sacral one;  Surgeon: [name], MD;  Location: [facility];  Service: Orthopedics;  Laterality: Right;  . Stress Cardiolite  11/30/2004  . TOTAL HIP ARTHROPLASTY  10/28/2012   Procedure: TOTAL HIP ARTHROPLASTY;  Surgeon: [name], MD;  Location: [facility];  Service: Orthopedics;  Laterality: Right;    Social History   Socioeconomic History  . Marital status: Single    Spouse name: Not on file  . Number of children: 0  . Years of education: Not on file  . Highest education level: Not on file   Occupational History  . 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 Occupation: Retired  Tobacco Use  . Smoking status: Current Every Day Smoker    Packs/day: 1.00    Years: 48.00    Pack years: 48.00    Types: Cigarettes  . Smokeless tobacco: Never Used  . Tobacco comment: states he wants to reduce his dependence.  Vaping Use  . Vaping Use: Never used  Substance and Sexual Activity  . Alcohol use: Yes    Alcohol/week: 0.0 standard drinks    Comment: rare  . Drug use: No  . Sexual activity: Not Currently  Other Topics Concern  . Not on file  Social History Narrative   Originally from [place], [place]   Lives alone with dog, has a couple of friends he can rely on for support.    Social Determinants of Health   Financial Resource Strain: Low Risk   . Difficulty of Paying Living Expenses: Not hard at all  Food Insecurity:   . Worried About Charity fundraiser in the Last Year: Not on file  . Ran Out of Food in the Last Year: Not on file  Transportation Needs: No Transportation Needs  . Lack of Transportation (  Medical): No  . Lack of Transportation (Non-Medical): No  Physical Activity:   . Days of Exercise per Week: Not on file  . Minutes of Exercise per Session: Not on file  Stress:   . Feeling of Stress : Not on file  Social Connections:   . Frequency of Communication with Friends and Family: Not on file  . Frequency of Social Gatherings with Friends and Family: Not on file  . Attends Religious Services: Not on file  . Active Member of Clubs or Organizations: Not on file  . Attends Archivist Meetings: Not on file  . Marital Status: Not on file  Intimate Partner Violence:   . Fear of Current or Ex-Partner: Not on file  . Emotionally Abused: Not on file  . Physically Abused: Not on file  . Sexually Abused: Not on file    Current Outpatient Medications on File Prior to Visit  Medication Sig Dispense Refill  .  Ascorbic Acid (VITAMIN C) 1000 MG tablet Take 2,000 mg by mouth daily.    Marland Kitchen aspirin EC 81 MG tablet Take 1 tablet (81 mg total) by mouth daily. 30 tablet 11  . atenolol (TENORMIN) 25 MG tablet TAKE 1 TABLET BY MOUTH EVERY DAY 90 tablet 1  . b complex vitamins tablet Take 1 tablet by mouth daily.    Marland Kitchen BIOTIN PO Take by mouth. 517mg once daily    . Cholecalciferol (VITAMIN D) 2000 UNITS tablet Take 2,000 Units by mouth daily.    . Coenzyme Q10 (CO Q 10) 100 MG CAPS Take 200 mg by mouth daily.     . Cyanocobalamin (VITAMIN B-12 PO) Take 1 tablet by mouth daily.    .Marland KitchenFOLIC ACID PO Take 1 tablet by mouth daily.    . Ginkgo Biloba (GINKOBA PO) Take 1 tablet by mouth daily.    .Marland Kitchenglucose blood (ONETOUCH VERIO) test strip 1 each by Other route daily. Use as instructed 90 each 3  . icosapent Ethyl (VASCEPA) 1 g capsule Take 2 capsules (2 g total) by mouth 2 (two) times daily. 360 capsule 3  . lisinopril (ZESTRIL) 40 MG tablet Take 1 tablet (40 mg total) by mouth daily. 90 tablet 3  . Magnesium 400 MG CAPS Take 400 mg by mouth daily.     . metFORMIN (GLUCOPHAGE-XR) 500 MG 24 hr tablet Take 2 tablets (1,000 mg total) by mouth daily. 180 tablet 3  . Multiple Vitamin (MULTIVITAMIN) tablet Take 1 tablet by mouth daily.    . nitroGLYCERIN (NITROSTAT) 0.4 MG SL tablet Place 1 tablet (0.4 mg total) under the tongue every 5 (five) minutes as needed for chest pain. 30 tablet 11  . REPATHA SURECLICK 1606MG/ML SOAJ INJECT 1 PEN INTO THE SKIN EVERY 14 (FOURTEEN) DAYS. 6 pen 3  . rosuvastatin (CRESTOR) 5 MG tablet TAKE 1 TABLET (5 MG TOTAL) BY MOUTH DAILY AT 6 PM. 90 tablet 1  . Semaglutide (RYBELSUS) 7 MG TABS Take 7 mg by mouth daily. 30 tablet 11  . VITAMIN A PO Take 1 tablet by mouth every other day.     . vitamin E 100 UNIT capsule Take 100 Units by mouth daily.    . Zinc 50 MG TABS Take 50 mg by mouth daily.    . Testosterone 25 MG/2.5GM (1%) GEL Place 50 mg onto the skin daily. 2.5 g 3   Current  Facility-Administered Medications on File Prior to Visit  Medication Dose Route Frequency Provider Last Rate Last Admin  .  regadenoson (LEXISCAN) injection SOLN 0.4 mg  0.4 mg Intravenous Once Donato Heinz, MD        Allergies  Allergen Reactions  . Actos [Pioglitazone Hydrochloride]     Weight gain    Family History  Problem Relation Age of Onset  . Colon cancer Mother        Colon Cancer-at advanced age  . Heart disease Father   . Breast cancer Sister     BP 128/76   Pulse 72   Ht 5' 7"  (1.702 m)   Wt 196 lb (88.9 kg)   SpO2 96%   BMI 30.70 kg/m    Review of Systems Denies nausea.  He has lost 7 lbs since last ov here.      Objective:   Physical Exam VITAL SIGNS:  See vs page GENERAL: no distress Pulses: dorsalis pedis intact bilat.   MSK: no deformity of the feet CV: no leg edema Skin:  no ulcer on the feet.  normal color and temp on the feet. Neuro: sensation is intact to touch on the feet.    Lab Results  Component Value Date   HGBA1C 6.2 (H) 09/11/2020   Lab Results  Component Value Date   CREATININE 0.72 (L) 09/11/2020   BUN 16 09/11/2020   NA 145 (H) 09/11/2020   K 4.7 09/11/2020   CL 107 (H) 09/11/2020   CO2 22 09/11/2020       Assessment & Plan:  Type 2 DM, with CAD: well-controlled Obesity, improved.  Increasing [place] might help further  Patient Instructions  I have sent a prescription to your pharmacy, to increase the [place].  Please continue the same other medications. check your blood sugar once a day.  vary the time of day when you check, between before the 3 meals, and at bedtime.  also check if you have symptoms of your blood sugar being too high or too low.  please keep a record of the readings and bring it to your next appointment here (or you can bring the meter itself).  You can write it on any piece of paper.  please call us sooner if your blood sugar goes below 70, or if you have a lot of readings over 200. Please  come back for a follow-up appointment in 5-6 months.

## 2020-09-19 NOTE — Patient Instructions (Addendum)
I have sent a prescription to your pharmacy, to increase the [place].  Please continue the same other medications. check your blood sugar once a day.  vary the time of day when you check, between before the 3 meals, and at bedtime.  also check if you have symptoms of your blood sugar being too high or too low.  please keep a record of the readings and bring it to your next appointment here (or you can bring the meter itself).  You can write it on any piece of paper.  please call us sooner if your blood sugar goes below 70, or if you have a lot of readings over 200. Please come back for a follow-up appointment in 5-6 months.

## 2020-10-09 ENCOUNTER — Other Ambulatory Visit: Payer: Self-pay | Admitting: Cardiology

## 2020-10-09 DIAGNOSIS — I10 Essential (primary) hypertension: Secondary | ICD-10-CM

## 2020-10-09 DIAGNOSIS — E785 Hyperlipidemia, unspecified: Secondary | ICD-10-CM

## 2020-10-19 ENCOUNTER — Ambulatory Visit: Payer: Medicare Other | Admitting: Pharmacist

## 2020-10-19 DIAGNOSIS — E119 Type 2 diabetes mellitus without complications: Secondary | ICD-10-CM

## 2020-10-19 DIAGNOSIS — I10 Essential (primary) hypertension: Secondary | ICD-10-CM

## 2020-10-19 NOTE — Chronic Care Management (AMB) (Signed)
Chronic Care Management Pharmacy  Name: [name] [name]  MRN: [id number] DOB: Sep 20, 1951  Initial Questions: 1. Have you seen any other providers since your last visit? Yes  2. Any changes in your medicines or health? Yes   Chief Complaint/ HPI  [name] [name],  69 y.o. , male presents for their Follow-Up CCM visit with the clinical pharmacist via telephone due to COVID-19 Pandemic.  PCP : Isaac Bliss, Rayford Halsted, MD  Their chronic conditions include: HTN, HLD, CAD, DM, GERD, Tobacco Use, Back pain, Hypogonadism  Office Visits: 03/23/2020- Lelon Frohlich, MD- Patient presented for office visit for 3 month follow up. CBC to be checked and determine if testosterone is to restarted. No other major interventions. Patient to return in 4 months.   12/23/2019- Lelon Frohlich, MD- Patient presented for office visit for annual exam and wellness visit. Patient to obtain repeat testosterone levels and A1c. Patient not interested in smoking cessation. Patient to return in 1 year or sooner if needed.   Consult Visit: 09/19/20 [name], MD (endocrinology): Patient presented for DM follow up. Increased Farxiga to 10 mg daily. Follow up in 5-6 months.  09/11/20 [name], MD (cardiology): Patient presented for CAD follow up. Lipid profile was significantly improved.  05/16/20 [name], MD (endocrinology): Patient presented for DM follow up. Increased semaglutide to 7 mg daily.  02/25/2020- Cardiology- [name], MD- Patient presented for office visit for follow up of CAD, HTN, HLD. Patient is asymptomatic. Patient to continue aspirin, rosuvastatin, Repatha, Vascepa. Patient to return in 6 months.   02/15/2020- Endocrinology- [name], MD- Patient presented for office visit for DM follow up. Rybelsus stated.  Follow up visit in 3 months.   Medications: Outpatient Encounter Medications as of 10/19/2020  Medication Sig  . Ascorbic Acid (VITAMIN C) 1000 MG  tablet Take 2,000 mg by mouth daily.  Marland Kitchen aspirin EC 81 MG tablet Take 1 tablet (81 mg total) by mouth daily.  Marland Kitchen atenolol (TENORMIN) 25 MG tablet TAKE 1 TABLET BY MOUTH EVERY DAY  . b complex vitamins tablet Take 1 tablet by mouth daily.  Marland Kitchen BIOTIN PO Take by mouth. 51mg once daily  . Cholecalciferol (VITAMIN D) 2000 UNITS tablet Take 2,000 Units by mouth daily.  . Coenzyme Q10 (CO Q 10) 100 MG CAPS Take 200 mg by mouth daily.   . Cyanocobalamin (VITAMIN B-12 PO) Take 1 tablet by mouth daily.  . dapagliflozin propanediol (FARXIGA) 10 MG TABS tablet Take 1 tablet (10 mg total) by mouth daily before breakfast.  . FOLIC ACID PO Take 1 tablet by mouth daily.  . Ginkgo Biloba (GINKOBA PO) Take 1 tablet by mouth daily.  .Marland Kitchenglucose blood (ONETOUCH VERIO) test strip 1 each by Other route daily. Use as instructed  . icosapent Ethyl (VASCEPA) 1 g capsule Take 2 capsules (2 g total) by mouth 2 (two) times daily.  .Marland Kitchenlisinopril (ZESTRIL) 40 MG tablet TAKE 1 TABLET BY MOUTH EVERY DAY  . Magnesium 400 MG CAPS Take 400 mg by mouth daily.   . metFORMIN (GLUCOPHAGE-XR) 500 MG 24 hr tablet Take 2 tablets (1,000 mg total) by mouth daily.  . Multiple Vitamin (MULTIVITAMIN) tablet Take 1 tablet by mouth daily.  . nitroGLYCERIN (NITROSTAT) 0.4 MG SL tablet Place 1 tablet (0.4 mg total) under the tongue every 5 (five) minutes as needed for chest pain.  . pantoprazole (PROTONIX) 40 MG tablet Take 40 mg by mouth daily as needed.  .Marland KitchenREPATHA SURECLICK 1829MG/ML  SOAJ INJECT 1 PEN INTO THE SKIN EVERY 14 (FOURTEEN) DAYS.  Marland Kitchen rosuvastatin (CRESTOR) 5 MG tablet TAKE 1 TABLET (5 MG TOTAL) BY MOUTH DAILY AT 6 PM.  . Semaglutide (RYBELSUS) 7 MG TABS Take 7 mg by mouth daily.  Marland Kitchen VITAMIN A PO Take 1 tablet by mouth every other day.   . vitamin E 100 UNIT capsule Take 100 Units by mouth daily.  . Zinc 50 MG TABS Take 50 mg by mouth daily.  . Testosterone 25 MG/2.5GM (1%) GEL Place 50 mg onto the skin daily.   Facility-Administered  Encounter Medications as of 10/19/2020  Medication  . regadenoson (LEXISCAN) injection SOLN 0.4 mg   Patient reports his only concern for his medications right now is that he was informed that his insurance requires another prior authorization for the [place] for 2022. Recommended for him to call Dr. Francesca Oman office to work on the paperwork for this.   Current Diagnosis/Assessment:  Goals Addressed            This Visit's Progress   . Pharmacy Care Plan       CARE PLAN ENTRY  Current Barriers:  . Chronic Disease Management support, education, and care coordination needs related to Hypertension, Hyperlipidemia, Diabetes, Coronary Artery Disease, GERD, Tobacco use, and Back pain, low testosterone   Hypertension . Pharmacist Clinical Goal(s): o Over the next 180 days, patient will work with PharmD and providers to maintain BP goal <130/80 . Current regimen:   Atenolol (Tenormin) 52m, 1 tablet once daily   Lisinopril 438m 1 tablet once daily . Patient self care activities - Over the next 180 days, patient will: o Ensure daily salt intake < 2300 mg/day  Hyperlipidemia/ Coronary atherosclerosis  . Pharmacist Clinical Goal(s): o Over the next 180 days, patient will work with PharmD and providers to maintain LDL goal < 70 . Current regimen:   evolocumab (Repatha) 14077mml, inject 1 pen into skin every fourteen days  icosapent ethyl (Vascepa) 1g, take 2 capsules twice daily   Rosuvastatin 5mg88m tablet once daily at 6pm  Nitroglycerin 0.4mg 53m place 1 tablet under tongue every 5 minutes as needed for chest pain  Aspirin 81mg,32mblet once daily  . Patient self care activities - Over the next 180 days, patient will: o Continue current medications as directed.   Diabetes . Pharmacist Clinical Goal(s): o Over the next 180 days, patient will work with PharmD and providers to maintain A1c goal <7% . Current regimen:   dapagliflozin (Farxiga) 10mg, 53mblet once daily before  breakfast  Metformin ER 500mg, 246mlet once daily  semaglutide (Rybelsus) 7mg, 1 t79met once daily  . Interventions: o Discussed checking blood sugars more consistently at home . Patient self care activities - Over the next 180 days, patient will: o Check blood sugar a few times weekly, document, and provide at future appointments o Contact provider with any episodes of hypoglycemia  GERD (acid reflux)  . Pharmacist Clinical Goal(s) o Over the next 180 days, patient will work with PharmD and providers to minimize reflux symptoms  . Current regimen:  o Pantoprazole 40mg, 1 t14mt once daily as needed . Patient self care activities - Over the next 180 days, patient will: o Continue current medications.  Low testosterone  . Pharmacist Clinical Goal(s) o Over the next 180 days, patient will work with PharmD and providers to maintain testosterone levels at or above 300.  . Current regimen:  o Testosterone 1% gel, apply 25mcg ever32m  morning  . Patient self care activities o Patient will follow up for lab visit.   Back pain . Pharmacist Clinical Goal(s) o Over the next 180 days, patient will work with PharmD and providers to minimize back pain . Current regimen:  o Tizanidine 61m, 1 tablet every eight hours as needed for muscle spasms . Patient self care activities o Patient will continue current medications as instructed.   Medication management . Pharmacist Clinical Goal(s): o Over the next 180 days, patient will work with PharmD and providers to maintain optimal medication adherence . Current pharmacy: [facility] . Interventions o Comprehensive medication review performed. o Continue current medication management strategy . Patient self care activities - Over the next 180 days, patient will: o Take medications as prescribed o Report any questions or concerns to PharmD and/or provider(s)  Please see past updates related to this goal by clicking on the "Past Updates" button in the  selected goal          SDOH Interventions   Flowsheet Row Most Recent Value  SDOH Interventions   Financial Strain Interventions Intervention Not Indicated  Transportation Interventions Intervention Not Indicated       Diabetes  A1c goal < 7% (pt reported Dr. ECordelia Pengoal is < 6%).  Recent Relevant Labs: Lab Results  Component Value Date/Time   HGBA1C 6.2 (H) 09/11/2020 02:56 PM   HGBA1C 6.7 (A) 05/16/2020 02:28 PM   HGBA1C 7.2 (A) 02/15/2020 01:42 PM   HGBA1C 7.4 (H) 12/23/2019 02:44 PM   HGBA1C 6.3 02/08/2019 02:12 PM   MICROALBUR 0.9 11/19/2016 04:01 PM   MICROALBUR 2.4 (H) 10/18/2015 02:19 PM    Checking BG: maybe once a month   Recent BG Readings: could not confirm BGs but denies any < 70  Patient has failed these meds in past: Invokana ([employer]ulary)  Patient is currently controlled on the following medications:   dapagliflozin (Farxiga) 159m 1 tablet once daily before breakfast  Metformin ER 50026m2 tablets once daily  semaglutide (Rybelsus) 7mg55m tablet once daily   Last diabetic Eye exam:  Lab Results  Component Value Date/Time   HMDIABEYEEXA No Retinopathy 07/25/2020 12:00 AM    Last diabetic Foot exam: No results found for: HMDIABFOOTEX   We discussed: how to recognize and treat signs of hypoglycemia and appropriate blood glucose monitoring   -Patient denies symptoms of hypoglycemia but does not currently check BGs frequently -Exercise: walks around both outside and inside  -Diet: Rybelsus cut his appetite down and he has lost a few pounds slowly; patient reports that he didn't eat a lot of carbs to begin with  Plan Continue current medications   Hypertension  Denies dizziness/ lightheadedness/ orthostatic hypotension  BP today is:  <130/80  Office blood pressures are  BP Readings from Last 3 Encounters:  09/19/20 128/76  09/11/20 126/78  05/16/20 140/80   Patient has failed these meds in the past: hydrochlorothiazide    Patient checks BP at home: BP  Patient home BP readings are ranging: NA   Patient is controlled on:   Atenolol (Tenormin) 25mg33mtablet once daily (night)   Lisinopril 40mg,66mablet once daily (night)   We discussed: -DASH eating plan recommendations: . Emphasizes vegetables, fruits, and whole-grains . Includes fat-free or low-fat dairy products, fish, poultry, beans, nuts, and vegetable oils . Limits foods that are high in saturated fat. These foods include fatty meats, full-fat dairy products, and tropical oils such as coconut, palm kernel, and palm oils. .Marland Kitchen  Limits sugar-sweetened beverages and sweets . Limiting sodium intake to < 1500 mg/day -BP denies dizziness/lightheadedness  Plan Continue current medications  Patient will start checking BP a couple times a month.  Hyperlipidemia   Lipid Panel     Component Value Date/Time   CHOL 86 (L) 09/11/2020 1456   TRIG 97 09/11/2020 1456   HDL 57 09/11/2020 1456   LDLCALC 11 09/11/2020 1456   LDLDIRECT 118 (H) 08/23/2019 1552   LDLDIRECT 125.0 12/16/2018 1246     The ASCVD Risk score (Goff DC Jr., et al., 2013) failed to calculate for the following reasons:   The valid total cholesterol range is 130 to 320 mg/dL   Patient has failed these meds in past: atorvastatin, simvastatin  (myalgia)  Patient is currently controlled on the following medications:   evolocumab (Repatha) 157m/ ml, inject 1 pen into skin every fourteen days  icosapent ethyl (Vascepa) 1 g, take 2 capsules twice daily   Rosuvastatin 566m 1 tablet once daily at 6pm  We discussed: PA requirements for Repatha and cost of medications   Plan Continue current medications  Coronary atherosclerosis   Reports never needing to use nitroglycerin. He reports refilling recently due to having expired medication   Patient is currently controlled on the following medications:  . Aspirin 816m tablet once daily   evolocumab (Repatha) 140m70ml, inject 1  pen into skin every fourteen days  Nitroglycerin 0.4mg 34m place 1 tablet under tongue every 5 minutes as needed for chest pain  icosapent ethyl (Vascepa) 1g, take 2 capsule twice daily   Rosuvastatin 5mg, 26mablet once daily at 6pm  Plan Continue current medications  Tobacco Abuse   Tobacco Status:  Social History   Tobacco Use  Smoking Status Current Every Day Smoker  . Packs/day: 1.00  . Years: 48.00  . Pack years: 48.00  . Types: Cigarettes  Smokeless Tobacco Never Used  Tobacco Comment   states he wants to reduce his dependence.   Patient reports he has cut down from 2 PPD to 1 PPD and truly enjoys it. He doesn't know if he will ever be ready to quit.   Patient smokes Within 30 minutes of waking Patient triggers include: enjoyment - no specific triggers On a scale of 1-10, reports MOTIVATION to quit is 1 On a scale of 1-10, reports CONFIDENCE in quitting is 1  Previous quit attempts included: none Patient is currently uncontrolled on the following medications:  . No medications  We discussed:  Advantages of smoking cessation specifically for CVD benefits (reduces risk of stroke by 70%); patient reports he has already received the quit line phone number and does not need it again  Plan  Continue without medications. Reassess at follow up.    GERD  Reports taking every other day due to not having symptoms   Patient has failed these meds in past: none   Patient is currently controlled on the following medications:  . Pantoprazole 40 mg 1 tablet PRN (every week is different)  We discussed: Non-pharmacologic management of symptoms such as elevating the head of your bed, avoiding eating 2-3 hours before bed, avoiding triggering foods such as acidic, spicy, or fatty foods, eating smaller meals, and wearing clothes that are loose around the waist  Plan Continue current medications   Low testosterone   Testosterone  Date Value Ref Range Status  12/23/2019  276.95 (L) 300.00 - 890.00 ng/dL Final   Hemoglobin & Hematocrit     Component Value Date/Time  HGB 14.7 09/11/2020 1456   HCT 44.8 09/11/2020 1456    Patient is currently controlled on the following medications:  . Testosterone 1% gel, apply 49mg every morning   Plan Continue current medications   Back pain   Patient reports rarely taking it, but has on hand if needed.  Patient is currently controlled on the following medications:  . Tizanidine 288m 1 tablet every eight hours as needed for muscle spasms  Plan Continue current medications.   OTC/ supplements   Patient is currently on the following medications:   Vitamin C 10004m2000m88mce daily  Vitmain B complex, 1 tablet once daily  Cholecalciferol (vitamin D) 2000 units, 1 tablet once daily  Cyanocobalamin (vitamin B12), 1 tablet once daily   Folic acid, 1 tablet once daily  Ginkgo biloba, 1 tablet once daily   Multivitamin, 1 tablet once daily   Vitamin A, 1 tablet every other day   Vitamin E 100 units, 1 capsule once daily  Zinc 50mg21mtablet once daily   Biotin 500mg 69m daily  We discussed: duplication of multi-vitamin and separate vitamins. Patient reports wanting to add additional dose to day intake of vitamins.   Plan Continue current medications. Will readdress at follow up.   Vaccines   Reviewed and discussed patient's vaccination history.    Immunization History  Administered Date(s) Administered  . Fluad Quad(high Dose 65+) 08/10/2019, 07/24/2020  . Influenza Split 10/25/2011, 08/28/2012  . Influenza Whole 09/01/2008  . Influenza, High Dose Seasonal PF 11/20/2018  . Influenza,inj,Quad PF,6+ Mos 09/27/2013, 10/14/2014, 10/18/2015, 11/19/2016, 11/21/2017  . PFIZER SARS-COV-2 Vaccination 12/13/2019, 01/09/2020, 08/25/2020  . Pneumococcal Conjugate-13 05/20/2017  . Pneumococcal Polysaccharide-23 09/27/2013, 11/20/2018  . Td 05/31/1998  . Tdap 10/14/2014  . Zoster 10/25/2011  .  Zoster Recombinat (Shingrix) 05/20/2017, 01/08/2019, 04/09/2019   Verified COVID booster with [facility]. Updated immunization record to reflect administration on 08/25/20.  Plan  Patient is up to date on all immunizations.   Medication Management   Patient's [facility] pharmacy is:  [id number] [place] [facility] [id number] [phone] box? Yes Pt endorses 100% compliance  We discussed: Current pharmacy is preferred with insurance plan and patient is satisfied with pharmacy services  Plan  Continue current medication management strategy   Follow up: 6 month phone visit  MadelinJeni SallesD BCACP CSilverado Resortcist LeBauerCoventry [phone]

## 2020-10-19 NOTE — Patient Instructions (Addendum)
Hi [name] [name],  It was lovely to get to speak with you over the phone today! I definitely want to encourage you to keep up the good work with taking your medications as prescribed and making some lifestyle modifications that we discussed. I do think it would be beneficial for you to check your blood sugars more frequently than you are currently as I do suspect you may be having some low blood sugars that you are unaware of.   Please give me a call or send me a message if you have any questions or need anything before our follow up in 6 months!  Best, Maddie  Jeni Salles, PharmD [facility] Clinical Pharmacist [place] [facility] [place]   Visit Information  Goals Addressed            This Visit's Progress   . Pharmacy Care Plan       CARE PLAN ENTRY  Current Barriers:  . Chronic Disease Management support, education, and care coordination needs related to Hypertension, Hyperlipidemia, Diabetes, Coronary Artery Disease, GERD, Tobacco use, and Back pain, low testosterone   Hypertension . Pharmacist Clinical Goal(s): o Over the next 180 days, patient will work with PharmD and providers to maintain BP goal <130/80 . Current regimen:   Atenolol (Tenormin) 63m, 1 tablet once daily   Lisinopril 435m 1 tablet once daily . Patient self care activities - Over the next 180 days, patient will: o Ensure daily salt intake < 2300 mg/day  Hyperlipidemia/ Coronary atherosclerosis  . Pharmacist Clinical Goal(s): o Over the next 180 days, patient will work with PharmD and providers to maintain LDL goal < 70 . Current regimen:   evolocumab (Repatha) 14064mml, inject 1 pen into skin every fourteen days  icosapent ethyl (Vascepa) 1g, take 2 capsules twice daily   Rosuvastatin 5mg79m tablet once daily at 6pm  Nitroglycerin 0.4mg 86m place 1 tablet under tongue every 5 minutes as needed for chest pain  Aspirin 81mg,68mblet once daily  . Patient self care activities -  Over the next 180 days, patient will: o Continue current medications as directed.   Diabetes . Pharmacist Clinical Goal(s): o Over the next 180 days, patient will work with PharmD and providers to maintain A1c goal <7% . Current regimen:   dapagliflozin (Farxiga) 10mg, 28mblet once daily before breakfast  Metformin ER 500mg, 243mlet once daily  semaglutide (Rybelsus) 7mg, 1 t64met once daily  . Interventions: o Discussed checking blood sugars more consistently at home . Patient self care activities - Over the next 180 days, patient will: o Check blood sugar a few times weekly, document, and provide at future appointments o Contact provider with any episodes of hypoglycemia  GERD (acid reflux)  . Pharmacist Clinical Goal(s) o Over the next 180 days, patient will work with PharmD and providers to minimize reflux symptoms  . Current regimen:  o Pantoprazole 40mg, 1 t60mt once daily as needed . Patient self care activities - Over the next 180 days, patient will: o Continue current medications.  Low testosterone  . Pharmacist Clinical Goal(s) o Over the next 180 days, patient will work with PharmD and providers to maintain testosterone levels at or above 300.  . Current regimen:  o Testosterone 1% gel, apply 25mcg ever42mrning  . Patient self care activities o Patient will follow up for lab visit.   Back pain . Pharmacist Clinical Goal(s) o Over the next 180 days, patient will work with PharmD and providers to minimize  back pain . Current regimen:  o Tizanidine 68m, 1 tablet every eight hours as needed for muscle spasms . Patient self care activities o Patient will continue current medications as instructed.   Medication management . Pharmacist Clinical Goal(s): o Over the next 180 days, patient will work with PharmD and providers to maintain optimal medication adherence . Current pharmacy: [facility] . Interventions o Comprehensive medication review performed. o Continue  current medication management strategy . Patient self care activities - Over the next 180 days, patient will: o Take medications as prescribed o Report any questions or concerns to PharmD and/or provider(s)  Please see past updates related to this goal by clicking on the "Past Updates" button in the selected goal         The patient verbalized understanding of instructions, educational materials, and care plan provided today and declined offer to receive copy of patient instructions, educational materials, and care plan.   Telephone follow up appointment with pharmacy team member scheduled for: 6 months  MViona Gilmore [facility] Preventing Hypoglycemia Hypoglycemia occurs when the level of sugar (glucose) in the blood is too low. Hypoglycemia can happen in people who do or do not have diabetes (diabetes mellitus). It can develop quickly, and it can be a medical emergency. For most people with diabetes, a blood glucose level below 70 mg/dL (3.9 mmol/L) is considered hypoglycemia. Glucose is a type of sugar that provides the body's main source of energy. Certain hormones (insulin and glucagon) control the level of glucose in the blood. Insulin lowers blood glucose, and glucagon increases blood glucose. Hypoglycemia can result from having too much insulin in the bloodstream, or from not eating enough food that contains glucose. Your risk for hypoglycemia is higher:  If you take insulin or diabetes medicines to help lower your blood glucose or help your body make more insulin.  If you skip or delay a meal or snack.  If you are ill.  During and after exercise. You can prevent hypoglycemia by working with your health care provider to adjust your meal plan as needed and by taking other precautions. How can hypoglycemia affect me? Mild symptoms Mild hypoglycemia may not cause any symptoms. If you do have symptoms, they may include:  Hunger.  Anxiety.  Sweating and feeling  clammy.  Dizziness or feeling light-headed.  Sleepiness.  Nausea.  Increased heart rate.  Headache.  Blurry vision.  Irritability.  Tingling or numbness around the mouth, lips, or tongue.  A change in coordination.  Restless sleep. If mild hypoglycemia is not recognized and treated, it can quickly become moderate or severe hypoglycemia. Moderate symptoms Moderate hypoglycemia can cause:  Mental confusion and poor judgment.  Behavior changes.  Weakness.  Irregular heartbeat. Severe symptoms Severe hypoglycemia is a medical emergency. It can cause:  Fainting.  Seizures.  Loss of consciousness (coma).  Death. What nutrition changes can be made?  Work with your health care provider or diet and nutrition specialist (dietitian) to make a healthy meal plan that is right for you. Follow your meal plan carefully.  Eat meals at regular times.  If recommended by your health care provider, have snacks between meals.  Donot skip or delay meals or snacks. You can be at risk for hypoglycemia if you are not getting enough carbohydrates. What lifestyle changes can be made?   Work closely with your health care provider to manage your blood glucose. Make sure you know: ? Your goal blood glucose levels. ? How and when  to check your blood glucose. ? The symptoms of hypoglycemia. It is important to treat it right away to keep it from becoming severe.  Do not drink alcohol on an empty stomach.  When you are ill, check your blood glucose more often than usual. Follow your sick day plan whenever you cannot eat or drink normally. Make this plan in advance with your health care provider.  Always check your blood glucose before, during, and after exercise. How is this treated? This condition can often be treated by immediately eating or drinking something that contains sugar, such as:  Fruit juice, 4-6 oz (120-150 mL).  Regular (not diet) soda, 4-6 oz (120-150 mL).  Low-fat  milk, 4 oz (120 mL).  Several pieces of hard candy.  Sugar or honey, 1 Tbsp (15 mL). Treating hypoglycemia if you have diabetes If you are alert and able to swallow safely, follow the 15:15 rule:  Take 15 grams of a rapid-acting carbohydrate. Talk with your health care provider about how much you should take.  Rapid-acting options include: ? Glucose pills (take 15 grams). ? 6-8 pieces of hard candy. ? 4-6 oz (120-150 mL) of fruit juice. ? 4-6 oz (120-150 mL) of regular (not diet) soda.  Check your blood glucose 15 minutes after you take the carbohydrate.  If the repeat blood glucose level is still at or below 70 mg/dL (3.9 mmol/L), take 15 grams of a carbohydrate again.  If your blood glucose level does not increase above 70 mg/dL (3.9 mmol/L) after 3 tries, seek emergency medical care.  After your blood glucose level returns to normal, eat a meal or a snack within 1 hour. Treating severe hypoglycemia Severe hypoglycemia is when your blood glucose level is at or below 54 mg/dL (3 mmol/L). Severe hypoglycemia is a medical emergency. Get medical help right away. If you have severe hypoglycemia and you cannot eat or drink, you may need an injection of glucagon. A family member or close friend should learn how to check your blood glucose and how to give you a glucagon injection. Ask your health care provider if you need to have an emergency glucagon injection kit available. Severe hypoglycemia may need to be treated in a hospital. The treatment may include getting glucose through an IV. You may also need treatment for the cause of your hypoglycemia. Where to find more information  American Diabetes Association: www.diabetes.[employer] of Diabetes and Digestive and Kidney Diseases: [employer] Contact a health care provider if:  You have problems keeping your blood glucose in your target range.  You have frequent episodes of hypoglycemia. Get help right away  if:  You continue to have hypoglycemia symptoms after eating or drinking something containing glucose.  Your blood glucose level is at or below 54 mg/dL (3 mmol/L).  You faint.  You have a seizure. These symptoms may represent a serious problem that is an emergency. Do not wait to see if the symptoms will go away. Get medical help right away. Call your local emergency services (911 in the [place] Summary  Know the symptoms of hypoglycemia, and when you are at risk for it (such as during exercise or when you are sick). Check your blood glucose often when you are at risk for hypoglycemia.  Hypoglycemia can develop quickly, and it can be dangerous if it is not treated right away. If you have a history of severe hypoglycemia, make sure you know how to use your glucagon injection kit.  Make sure you  know how to treat hypoglycemia. Keep a carbohydrate snack available when you may be at risk for hypoglycemia. This information is not intended to replace advice given to you by your health care provider. Make sure you discuss any questions you have with your health care provider. Document Revised: 02/19/2019 Document Reviewed: 06/25/2017 Elsevier Patient Education  [place].

## 2020-11-07 ENCOUNTER — Encounter: Payer: Self-pay | Admitting: Internal Medicine

## 2020-12-04 ENCOUNTER — Ambulatory Visit (AMBULATORY_SURGERY_CENTER): Payer: Self-pay | Admitting: *Deleted

## 2020-12-04 ENCOUNTER — Other Ambulatory Visit: Payer: Self-pay

## 2020-12-04 VITALS — Ht 67.0 in | Wt 198.0 lb

## 2020-12-04 DIAGNOSIS — Z1211 Encounter for screening for malignant neoplasm of colon: Secondary | ICD-10-CM

## 2020-12-04 NOTE — Progress Notes (Signed)
No egg or soy allergy known to patient  No issues with past sedation with any surgeries or procedures No intubation problems in the past  No FH of Malignant Hyperthermia No diet pills per patient No home 02 use per patient  No blood thinners per patient  Pt denies issues with constipation  No A fib or A flutter  EMMI video to pt or via [place] 19 guidelines implemented in PV today with Pt and RN  Pt is fully vaccinated  for Covid   Due to the COVID-19 pandemic we are asking patients to follow certain guidelines.  Pt aware of COVID protocols and [facility] guidelines   Pt states he may have an issue with care partner- he will call if he needs to RS - informed CP has to be present the whole 2-3 hours- pt verbalized understanding

## 2020-12-14 ENCOUNTER — Telehealth: Payer: Self-pay | Admitting: Acute Care

## 2020-12-18 NOTE — Telephone Encounter (Signed)
[name] [name], can you contact pt to schedule f/u low dose CT?

## 2020-12-18 NOTE — Telephone Encounter (Signed)
I have left a message asking him to call me back to schedule his LCS CT

## 2020-12-18 NOTE — Telephone Encounter (Signed)
I have spoken with [name] and his LCS CT has been scheduled at [place] location on 01/03/2021 @ 3:20pm Patient is aware of the appt and location

## 2020-12-18 NOTE — Telephone Encounter (Signed)
Pt returning missed call [phone]

## 2020-12-19 ENCOUNTER — Ambulatory Visit (AMBULATORY_SURGERY_CENTER): Payer: Medicare Other | Admitting: Internal Medicine

## 2020-12-19 ENCOUNTER — Other Ambulatory Visit: Payer: Self-pay

## 2020-12-19 ENCOUNTER — Encounter: Payer: Self-pay | Admitting: Internal Medicine

## 2020-12-19 VITALS — BP 107/46 | HR 69 | Temp 97.4°F | Resp 19 | Ht 67.0 in | Wt 198.0 lb

## 2020-12-19 DIAGNOSIS — K621 Rectal polyp: Secondary | ICD-10-CM | POA: Diagnosis not present

## 2020-12-19 DIAGNOSIS — Z1211 Encounter for screening for malignant neoplasm of colon: Secondary | ICD-10-CM

## 2020-12-19 DIAGNOSIS — D128 Benign neoplasm of rectum: Secondary | ICD-10-CM

## 2020-12-19 MED ORDER — SODIUM CHLORIDE 0.9 % IV SOLN: 500.0000 mL | INTRAVENOUS | Status: DC

## 2020-12-19 NOTE — Progress Notes (Signed)
Called to room to assist during endoscopic procedure.  Patient ID and intended procedure confirmed with present staff. Received instructions for my participation in the procedure from the performing physician.  

## 2020-12-19 NOTE — Progress Notes (Signed)
PT taken to PACU. Monitors in place. VSS. Report given to RN. 

## 2020-12-19 NOTE — Progress Notes (Signed)
Pt's states no medical or surgical changes since previsit or office visit. 

## 2020-12-19 NOTE — Op Note (Signed)
[place] Patient Name: [name] [name] Procedure Date: 12/19/2020 1:04 PM MRN: [id number] Endoscopist: Gatha Mayer , MD Age: 70 Referring MD:  Date of Birth: Feb 16, 1951 Gender: Male Account #: [id number] Procedure:                Colonoscopy Indications:              Screening for colorectal malignant neoplasm, Last                            colonoscopy: 2011 Medicines:                Propofol per Anesthesia, Monitored Anesthesia Care Procedure:                Pre-Anesthesia Assessment:                           - Prior to the procedure, a History and Physical                            was performed, and patient medications and                            allergies were reviewed. The patient's tolerance of                            previous anesthesia was also reviewed. The risks                            and benefits of the procedure and the sedation                            options and risks were discussed with the patient.                            All questions were answered, and informed consent                            was obtained. Prior Anticoagulants: The patient has                            taken no previous anticoagulant or antiplatelet                            agents. ASA Grade Assessment: III - A patient with                            severe systemic disease. After reviewing the risks                            and benefits, the patient was deemed in                            satisfactory condition to undergo the procedure.  After obtaining informed consent, the colonoscope                            was passed under direct vision. Throughout the                            procedure, the patient's blood pressure, pulse, and                            oxygen saturations were monitored continuously. The                            Olympus CF-HQ190 [id number]) Colonoscope was                            introduced through the  anus and advanced to the the                            cecum, identified by appendiceal orifice and                            ileocecal valve. The colonoscopy was performed                            without difficulty. The patient tolerated the                            procedure well. The quality of the bowel                            preparation was adequate. The bowel preparation                            used was Miralax via split dose instruction. The                            ileocecal valve, appendiceal orifice, and rectum                            were photographed. Scope In: 1:23:26 PM Scope Out: 1:40:32 PM Scope Withdrawal Time: 0 hours 14 minutes 37 seconds  Total Procedure Duration: 0 hours 17 minutes 6 seconds  Findings:                 The perianal and digital rectal examinations were                            normal. Pertinent negatives include normal prostate                            (size, shape, and consistency).                           A diminutive polyp was found in the rectum. The  polyp was sessile. The polyp was removed with a                            cold snare. Resection and retrieval were complete.                            Verification of patient identification for the                            specimen was done. Estimated blood loss was minimal.                           Many small and large-mouthed diverticula were found                            in the sigmoid colon and descending colon.                           The exam was otherwise without abnormality on                            direct and retroflexion views. Complications:            No immediate complications. Estimated Blood Loss:     Estimated blood loss was minimal. Impression:               - One diminutive polyp in the rectum, removed with                            a cold snare. Resected and retrieved.                           - Severe diverticulosis  in the sigmoid colon and in                            the descending colon.                           - The examination was otherwise normal on direct                            and retroflexion views. Recommendation:           - Patient has a contact number available for                            emergencies. The signs and symptoms of potential                            delayed complications were discussed with the                            patient. Return to normal activities tomorrow.  Written discharge instructions were provided to the                            patient.                           - Resume previous diet.                           - Continue present medications.                           - Repeat colonoscopy may be recommended. The                            possible colonoscopy date will be determined after                            pathology results from today's exam become                            available for review. Gatha Mayer, MD 12/19/2020 1:52:44 PM This report has been signed electronically.

## 2020-12-19 NOTE — Patient Instructions (Addendum)
I found and removed one tiny polyp that looks benign.  I will let you know pathology results and when to have another routine colonoscopy by mail and/or My Chart.  You also have a condition called diverticulosis - common and not usually a problem. Please read the handout provided.  I appreciate the opportunity to care for you. Gatha Mayer, MD, [facility]   Handouts on polyps & diverticulosis given to you today   Await pathology results on polyp removed    YOU HAD AN ENDOSCOPIC PROCEDURE TODAY AT [place]:   Refer to the procedure report that was given to you for any specific questions about what was found during the examination.  If the procedure report does not answer your questions, please call your gastroenterologist to clarify.  If you requested that your care partner not be given the details of your procedure findings, then the procedure report has been included in a sealed envelope for you to review at your convenience later.  YOU SHOULD EXPECT: Some feelings of bloating in the abdomen. Passage of more gas than usual.  Walking can help get rid of the air that was put into your GI tract during the procedure and reduce the bloating. If you had a lower endoscopy (such as a colonoscopy or flexible sigmoidoscopy) you may notice spotting of blood in your stool or on the toilet paper. If you underwent a bowel prep for your procedure, you may not have a normal bowel movement for a few days.  Please Note:  You might notice some irritation and congestion in your nose or some drainage.  This is from the oxygen used during your procedure.  There is no need for concern and it should clear up in a day or so.  SYMPTOMS TO REPORT IMMEDIATELY:   Following lower endoscopy (colonoscopy or flexible sigmoidoscopy):  Excessive amounts of blood in the stool  Significant tenderness or worsening of abdominal pains  Swelling of the abdomen that is new, acute  Fever of 100F or  higher   For urgent or emergent issues, a gastroenterologist can be reached at any hour by calling [phone]. Do not use MyChart messaging for urgent concerns.    DIET:  We do recommend a small meal at first, but then you may proceed to your regular diet.  Drink plenty of fluids but you should avoid alcoholic beverages for 24 hours.  ACTIVITY:  You should plan to take it easy for the rest of today and you should NOT DRIVE or use heavy machinery until tomorrow (because of the sedation medicines used during the test).    FOLLOW UP: Our staff will call the number listed on your records 48-72 hours following your procedure to check on you and address any questions or concerns that you may have regarding the information given to you following your procedure. If we do not reach you, we will leave a message.  We will attempt to reach you two times.  During this call, we will ask if you have developed any symptoms of COVID 19. If you develop any symptoms (ie: fever, flu-like symptoms, shortness of breath, cough etc.) before then, please call [phone].  If you test positive for Covid 19 in the 2 weeks post procedure, please call and report this information to [place].    If any biopsies were taken you will be contacted by phone or by letter within the next 1-3 weeks.  Please call us at [phone] if you have  not heard about the biopsies in 3 weeks.    SIGNATURES/CONFIDENTIALITY: You and/or your care partner have signed paperwork which will be entered into your electronic medical record.  These signatures attest to the fact that that the information above on your After Visit Summary has been reviewed and is understood.  Full responsibility of the confidentiality of this discharge information lies with you and/or your care-partner.

## 2020-12-21 ENCOUNTER — Telehealth: Payer: Self-pay

## 2020-12-21 ENCOUNTER — Telehealth: Payer: Self-pay | Admitting: *Deleted

## 2020-12-21 NOTE — Telephone Encounter (Signed)
  Follow up Call-  Call back number 12/19/2020  Post procedure Call Back phone  # [phone]  Permission to leave phone message Yes  Some recent data might be hidden     Patient questions:  Do you have a fever, pain , or abdominal swelling? No. Pain Score  0 *  Have you tolerated food without any problems? Yes.    Have you been able to return to your normal activities? Yes.    Do you have any questions about your discharge instructions: Diet   No. Medications  No. Follow up visit  No.  Do you have questions or concerns about your Care? No.  Actions: * If pain score is 4 or above: No action needed, pain <4.  1. Have you developed a fever since your procedure? no  2.   Have you had an respiratory symptoms (SOB or cough) since your procedure? no  3.   Have you tested positive for COVID 19 since your procedure no  4.   Have you had any family members/close contacts diagnosed with the COVID 19 since your procedure?  no   If yes to any of these questions please route to Joylene John, RN and Joella Prince, RN

## 2020-12-21 NOTE — Telephone Encounter (Signed)
No answer, left message to call back later today, B.Carling Liberman RN. 

## 2020-12-28 ENCOUNTER — Encounter: Payer: Medicare Other | Admitting: Internal Medicine

## 2021-01-03 ENCOUNTER — Ambulatory Visit
Admission: RE | Admit: 2021-01-03 | Discharge: 2021-01-03 | Disposition: A | Discharge status: Home / Self Care | Payer: Medicare Other | Source: Ambulatory Visit | Attending: Acute Care | Admitting: Acute Care

## 2021-01-03 DIAGNOSIS — Z87891 Personal history of nicotine dependence: Secondary | ICD-10-CM | POA: Diagnosis not present

## 2021-01-03 DIAGNOSIS — F17210 Nicotine dependence, cigarettes, uncomplicated: Secondary | ICD-10-CM

## 2021-01-03 DIAGNOSIS — I2510 Atherosclerotic heart disease of native coronary artery without angina pectoris: Secondary | ICD-10-CM | POA: Diagnosis not present

## 2021-01-03 DIAGNOSIS — J984 Other disorders of lung: Secondary | ICD-10-CM | POA: Diagnosis not present

## 2021-01-03 DIAGNOSIS — J432 Centrilobular emphysema: Secondary | ICD-10-CM | POA: Diagnosis not present

## 2021-01-04 ENCOUNTER — Encounter: Payer: Self-pay | Admitting: Internal Medicine

## 2021-01-09 ENCOUNTER — Other Ambulatory Visit: Payer: Self-pay

## 2021-01-09 ENCOUNTER — Ambulatory Visit (INDEPENDENT_AMBULATORY_CARE_PROVIDER_SITE_OTHER): Payer: Medicare Other | Admitting: Internal Medicine

## 2021-01-09 ENCOUNTER — Encounter: Payer: Self-pay | Admitting: Internal Medicine

## 2021-01-09 VITALS — BP 120/78 | HR 76 | Temp 98.4°F | Ht 67.0 in | Wt 200.2 lb

## 2021-01-09 DIAGNOSIS — F17210 Nicotine dependence, cigarettes, uncomplicated: Secondary | ICD-10-CM | POA: Diagnosis not present

## 2021-01-09 DIAGNOSIS — K219 Gastro-esophageal reflux disease without esophagitis: Secondary | ICD-10-CM

## 2021-01-09 DIAGNOSIS — I2583 Coronary atherosclerosis due to lipid rich plaque: Secondary | ICD-10-CM | POA: Diagnosis not present

## 2021-01-09 DIAGNOSIS — R7989 Other specified abnormal findings of blood chemistry: Secondary | ICD-10-CM | POA: Diagnosis not present

## 2021-01-09 DIAGNOSIS — Z0001 Encounter for general adult medical examination with abnormal findings: Secondary | ICD-10-CM | POA: Diagnosis not present

## 2021-01-09 DIAGNOSIS — I10 Essential (primary) hypertension: Secondary | ICD-10-CM | POA: Diagnosis not present

## 2021-01-09 DIAGNOSIS — H6121 Impacted cerumen, right ear: Secondary | ICD-10-CM

## 2021-01-09 DIAGNOSIS — E119 Type 2 diabetes mellitus without complications: Secondary | ICD-10-CM

## 2021-01-09 DIAGNOSIS — K7581 Nonalcoholic steatohepatitis (NASH): Secondary | ICD-10-CM

## 2021-01-09 DIAGNOSIS — Z125 Encounter for screening for malignant neoplasm of prostate: Secondary | ICD-10-CM | POA: Diagnosis not present

## 2021-01-09 DIAGNOSIS — I2510 Atherosclerotic heart disease of native coronary artery without angina pectoris: Secondary | ICD-10-CM | POA: Diagnosis not present

## 2021-01-09 DIAGNOSIS — E785 Hyperlipidemia, unspecified: Secondary | ICD-10-CM

## 2021-01-09 DIAGNOSIS — Z0000 Encounter for general adult medical examination without abnormal findings: Secondary | ICD-10-CM

## 2021-01-09 LAB — COMPREHENSIVE METABOLIC PANEL
ALT: 28 U/L (ref 0–53)
AST: 21 U/L (ref 0–37)
Albumin: 4.6 g/dL (ref 3.5–5.2)
Alkaline Phosphatase: 47 U/L (ref 39–117)
BUN: 19 mg/dL (ref 6–23)
CO2: 29 mEq/L (ref 19–32)
Calcium: 9.9 mg/dL (ref 8.4–10.5)
Chloride: 107 mEq/L (ref 96–112)
Creatinine, Ser: 0.86 mg/dL (ref 0.40–1.50)
GFR: 88.81 mL/min (ref >60.00)
Glucose, Bld: 106 mg/dL — ABNORMAL HIGH (ref 70–99)
Potassium: 4.7 mEq/L (ref 3.5–5.1)
Sodium: 142 mEq/L (ref 135–145)
Total Bilirubin: 0.3 mg/dL (ref 0.2–1.2)
Total Protein: 7.1 g/dL (ref 6.0–8.3)

## 2021-01-09 LAB — CBC WITH DIFFERENTIAL/PLATELET
Basophils Absolute: 0.1 10*3/uL (ref 0.0–0.1)
Basophils Relative: 0.9 % (ref 0.0–3.0)
Eosinophils Absolute: 0.1 10*3/uL (ref 0.0–0.7)
Eosinophils Relative: 1.4 % (ref 0.0–5.0)
HCT: 40.4 % (ref 39.0–52.0)
Hemoglobin: 13.1 g/dL (ref 13.0–17.0)
Lymphocytes Relative: 18.8 % (ref 12.0–46.0)
Lymphs Abs: 1.8 10*3/uL (ref 0.7–4.0)
MCHC: 32.4 g/dL (ref 30.0–36.0)
MCV: 77.4 fl — ABNORMAL LOW (ref 78.0–100.0)
Monocytes Absolute: 0.7 10*3/uL (ref 0.1–1.0)
Monocytes Relative: 8 % (ref 3.0–12.0)
Neutro Abs: 6.7 10*3/uL (ref 1.4–7.7)
Neutrophils Relative %: 70.9 % (ref 43.0–77.0)
Platelets: 265 10*3/uL (ref 150.0–400.0)
RBC: 5.22 Mil/uL (ref 4.22–5.81)
RDW: 17.7 % — ABNORMAL HIGH (ref 11.5–15.5)
WBC: 9.4 10*3/uL (ref 4.0–10.5)

## 2021-01-09 LAB — PSA: PSA: 0.45 ng/mL (ref 0.10–4.00)

## 2021-01-09 LAB — TSH: TSH: 1.71 u[IU]/mL (ref 0.35–4.50)

## 2021-01-09 LAB — LIPID PANEL
Cholesterol: 88 mg/dL (ref 0–200)
HDL: 59.6 mg/dL (ref >39.00)
LDL Cholesterol: 1 mg/dL (ref 0–99)
NonHDL: 28.25
Total CHOL/HDL Ratio: 1
Triglycerides: 134 mg/dL (ref 0.0–149.0)
VLDL: 26.8 mg/dL (ref 0.0–40.0)

## 2021-01-09 LAB — VITAMIN D 25 HYDROXY (VIT D DEFICIENCY, FRACTURES): VITD: 53.53 ng/mL (ref 30.00–100.00)

## 2021-01-09 LAB — HEMOGLOBIN A1C: Hgb A1c MFr Bld: 7.1 % — ABNORMAL HIGH (ref 4.6–6.5)

## 2021-01-09 LAB — VITAMIN B12: Vitamin B-12: 1504 pg/mL — ABNORMAL HIGH (ref 211–911)

## 2021-01-09 NOTE — Patient Instructions (Signed)
-Nice seeing you today!!  -Lab work today; will notify you once results are available.  -debrox ear drops: 2 drops each ear at bedtime.  -Schedule follow up in 6 months or sooner as needed.   Preventive Care 69 Years and Older, Male Preventive care refers to lifestyle choices and visits with your health care provider that can promote health and wellness. This includes:  A yearly physical exam. This is also called an annual wellness visit.  Regular dental and eye exams.  Immunizations.  Screening for certain conditions.  Healthy lifestyle choices, such as: ? Eating a healthy diet. ? Getting regular exercise. ? Not using drugs or products that contain nicotine and tobacco. ? Limiting alcohol use. What can I expect for my preventive care visit? Physical exam Your health care provider will check your:  Height and weight. These may be used to calculate your BMI (body mass index). BMI is a measurement that tells if you are at a healthy weight.  Heart rate and blood pressure.  Body temperature.  Skin for abnormal spots. Counseling Your health care provider may ask you questions about your:  Past medical problems.  Family's medical history.  Alcohol, tobacco, and drug use.  Emotional well-being.  Home life and relationship well-being.  Sexual activity.  Diet, exercise, and sleep habits.  History of falls.  Memory and ability to understand (cognition).  Work and work Statistician.  Access to firearms. What immunizations do I need? Vaccines are usually given at various ages, according to a schedule. Your health care provider will recommend vaccines for you based on your age, medical history, and lifestyle or other factors, such as travel or where you work.   What tests do I need? Blood tests  Lipid and cholesterol levels. These may be checked every 5 years, or more often depending on your overall health.  Hepatitis C test.  Hepatitis B  test. Screening  Lung cancer screening. You may have this screening every year starting at age 69 if you have a 30-pack-year history of smoking and currently smoke or have quit within the past 15 years.  Colorectal cancer screening. ? All adults should have this screening starting at age 69 and continuing until age 69. ? Your health care provider may recommend screening at age 69 if you are at increased risk. ? You will have tests every 1-10 years, depending on your results and the type of screening test.  Prostate cancer screening. Recommendations will vary depending on your family history and other risks.  Genital exam to check for testicular cancer or hernias.  Diabetes screening. ? This is done by checking your blood sugar (glucose) after you have not eaten for a while (fasting). ? You may have this done every 1-3 years.  Abdominal aortic aneurysm (AAA) screening. You may need this if you are a current or former smoker.  STD (sexually transmitted disease) testing, if you are at risk. Follow these instructions at home: Eating and drinking  Eat a diet that includes fresh fruits and vegetables, whole grains, lean protein, and low-fat dairy products. Limit your intake of foods with high amounts of sugar, saturated fats, and salt.  Take vitamin and mineral supplements as recommended by your health care provider.  Do not drink alcohol if your health care provider tells you not to drink.  If you drink alcohol: ? Limit how much you have to 0-2 drinks a day. ? Be aware of how much alcohol is in your drink. In the [place] one  drink equals one 12 oz bottle of beer (355 mL), one 5 oz glass of wine (148 mL), or one 1 oz glass of hard liquor (44 mL).   Lifestyle  Take daily care of your teeth and gums. Brush your teeth every morning and night with fluoride toothpaste. Floss one time each day.  Stay active. Exercise for at least 30 minutes 5 or more days each week.  Do not use any products  that contain nicotine or tobacco, such as cigarettes, e-cigarettes, and chewing tobacco. If you need help quitting, ask your health care provider.  Do not use drugs.  If you are sexually active, practice safe sex. Use a condom or other form of protection to prevent STIs (sexually transmitted infections).  Talk with your health care provider about taking a low-dose aspirin or statin.  Find healthy ways to cope with stress, such as: ? Meditation, yoga, or listening to music. ? Journaling. ? Talking to a trusted person. ? Spending time with friends and family. Safety  Always wear your seat belt while driving or riding in a vehicle.  Do not drive: ? If you have been drinking alcohol. Do not ride with someone who has been drinking. ? When you are tired or distracted. ? While texting.  Wear a helmet and other protective equipment during sports activities.  If you have firearms in your house, make sure you follow all gun safety procedures. What's next?  Visit your health care provider once a year for an annual wellness visit.  Ask your health care provider how often you should have your eyes and teeth checked.  Stay up to date on all vaccines. This information is not intended to replace advice given to you by your health care provider. Make sure you discuss any questions you have with your health care provider. Document Revised: 07/27/2019 Document Reviewed: 10/22/2018 Elsevier Patient Education  2021 [employer].

## 2021-01-09 NOTE — Progress Notes (Signed)
Established Patient Office Visit     This visit occurred during the SARS-CoV-2 public health emergency.  Safety protocols were in place, including screening questions prior to the visit, additional usage of staff PPE, and extensive cleaning of exam room while observing appropriate contact time as indicated for disinfecting solutions.    CC/Reason for Visit: Annual preventive exam and subsequent Medicare wellness visit  HPI: [name] [name] is a 70 y.o. male who is coming in today for the above mentioned reasons. Past Medical History is significant for: Coronary artery disease.Follows with cardiology routinely.He received coronary stenting after a positive stress test in 2006;he remains very active and asymptomatic. Denies anginal chest discomfort. Has a history of benign essential hypertension that has been well controlled, hyperlipidemia on a statin, continues to smoke 1 PPD and is not interested in smoking cessation at the [facility] has type 2 diabetes with amost recent A1cof 6.2.Also has a h/o testosterone deficiency.  He has routine eye and dental care.  No hearing difficulty, he exercises on a daily basis.  He has noticed a sensation of right ear fullness.  He had a colonoscopy in February 2022.  All immunizations are up-to-date.  He had a recent low-dose CT scan for lung cancer screening that was stable and needs to follow-up in 12 months.   Past Medical/Surgical History: Past Medical History:  Diagnosis Date  . Anemia, iron deficiency 11/18/2008   Resolved, despite no rx--2012    . ANEMIA-IRON DEFICIENCY   . Arthritis   . BACK PAIN, LUMBAR   . Blood transfusion without reported diagnosis   . CORONARY ARTERY DISEASE   . DIVERTICULOSIS, COLON   . DM    on no medication ?prediabetes  . Elevated LFTs 11/18/2015  . GERD   . Hepatitis    States he was tested for Hep B and he had antibodies  . HYPERLIPIDEMIA   . HYPERTENSION    dr tom wall  . Nephrolithiasis   . PONV  (postoperative nausea and vomiting)    little nausea after having his hip replacement  . Unspecified disorder of urethra and urinary tract     Past Surgical History:  Procedure Laterality Date  . COLONOSCOPY    . CORONARY ANGIOPLASTY WITH STENT PLACEMENT     06    dr t. wall  . ELECTROCARDIOGRAM  03/30/2007  . ESOPHAGOGASTRODUODENOSCOPY  11/07/2005  . kidney stones     in office procedure to remove but no sedation surgeries for kidney stones   . LUMBAR LAMINECTOMY Right 05/18/2014   Procedure: LUMBAR LAMINECTOMY Microdiscectomy Right Lumbar five - sacral one;  Surgeon: [name], MD;  Location: [facility];  Service: Orthopedics;  Laterality: Right;  . Stress Cardiolite  11/30/2004  . TOTAL HIP ARTHROPLASTY  10/28/2012   Procedure: TOTAL HIP ARTHROPLASTY;  Surgeon: [name], MD;  Location: [facility];  Service: Orthopedics;  Laterality: Right;  . UPPER GASTROINTESTINAL ENDOSCOPY      Social History:  reports that he has been smoking cigarettes. He has a 48.00 pack-year smoking history. He has never used smokeless tobacco. He reports current alcohol use. He reports that he does not use drugs.  Allergies: Allergies  Allergen Reactions  . Actos [Pioglitazone Hydrochloride]     Weight gain    Family History:  Family History  Problem Relation Age of Onset  . Colon cancer Mother        Colon Cancer-at advanced age- in her 68's per pt   .  Heart disease Father   . Breast cancer Sister   . Esophageal cancer Neg Hx   . Rectal cancer Neg Hx   . Stomach cancer Neg Hx      Current Outpatient Medications:  .  Ascorbic Acid (VITAMIN C) 1000 MG tablet, Take 2,000 mg by mouth daily., Disp: , Rfl:  .  aspirin EC 81 MG tablet, Take 1 tablet (81 mg total) by mouth daily., Disp: 30 tablet, Rfl: 11 .  atenolol (TENORMIN) 25 MG tablet, TAKE 1 TABLET BY MOUTH EVERY DAY, Disp: 90 tablet, Rfl: 1 .  b complex vitamins tablet, Take 1 tablet by mouth daily., Disp: , Rfl:  .  BIOTIN PO, Take by mouth.  570mg once daily, Disp: , Rfl:  .  Cholecalciferol (VITAMIN D) 2000 UNITS tablet, Take 2,000 Units by mouth daily., Disp: , Rfl:  .  Coenzyme Q10 (CO Q 10) 100 MG CAPS, Take 200 mg by mouth daily. , Disp: , Rfl:  .  Cyanocobalamin (VITAMIN B-12 PO), Take 1 tablet by mouth daily., Disp: , Rfl:  .  dapagliflozin propanediol (FARXIGA) 10 MG TABS tablet, Take 1 tablet (10 mg total) by mouth daily before breakfast., Disp: 90 tablet, Rfl: 3 .  FOLIC ACID PO, Take 1 tablet by mouth daily., Disp: , Rfl:  .  Ginkgo Biloba (GINKOBA PO), Take 1 tablet by mouth daily., Disp: , Rfl:  .  glucose blood (ONETOUCH VERIO) test strip, 1 each by Other route daily. Use as instructed, Disp: 90 each, Rfl: 3 .  icosapent Ethyl (VASCEPA) 1 g capsule, Take 2 capsules (2 g total) by mouth 2 (two) times daily., Disp: 360 capsule, Rfl: 3 .  lisinopril (ZESTRIL) 40 MG tablet, TAKE 1 TABLET BY MOUTH EVERY DAY, Disp: 90 tablet, Rfl: 3 .  Magnesium 400 MG CAPS, Take 400 mg by mouth daily. , Disp: , Rfl:  .  metFORMIN (GLUCOPHAGE-XR) 500 MG 24 hr tablet, Take 2 tablets (1,000 mg total) by mouth daily., Disp: 180 tablet, Rfl: 3 .  Multiple Vitamin (MULTIVITAMIN) tablet, Take 1 tablet by mouth daily., Disp: , Rfl:  .  nitroGLYCERIN (NITROSTAT) 0.4 MG SL tablet, Place 1 tablet (0.4 mg total) under the tongue every 5 (five) minutes as needed for chest pain., Disp: 30 tablet, Rfl: 11 .  pantoprazole (PROTONIX) 40 MG tablet, Take 40 mg by mouth daily as needed., Disp: , Rfl:  .  REPATHA SURECLICK 1423MG/ML SOAJ, INJECT 1 PEN INTO THE SKIN EVERY 14 (FOURTEEN) DAYS., Disp: 6 pen, Rfl: 3 .  rosuvastatin (CRESTOR) 5 MG tablet, TAKE 1 TABLET (5 MG TOTAL) BY MOUTH DAILY AT 6 PM., Disp: 90 tablet, Rfl: 1 .  Semaglutide (RYBELSUS) 7 MG TABS, Take 7 mg by mouth daily., Disp: 30 tablet, Rfl: 11 .  Testosterone 25 MG/2.5GM (1%) GEL, Place 50 mg onto the skin daily., Disp: 2.5 g, Rfl: 3 .  VITAMIN A PO, Take 1 tablet by mouth every other day. ,  Disp: , Rfl:  .  vitamin E 100 UNIT capsule, Take 100 Units by mouth daily., Disp: , Rfl:  .  Zinc 50 MG TABS, Take 50 mg by mouth daily., Disp: , Rfl:  No current facility-administered medications for this visit.  Facility-Administered Medications Ordered in Other Visits:  .  regadenoson (LEXISCAN) injection SOLN 0.4 mg, 0.4 mg, Intravenous, Once, SDonato Heinz MD  Review of Systems:  Constitutional: Denies fever, chills, diaphoresis, appetite change and fatigue.  HEENT: Denies photophobia, eye pain, redness, hearing  loss, ear pain, congestion, sore throat, rhinorrhea, sneezing, mouth sores, trouble swallowing, neck pain, neck stiffness and tinnitus.   Respiratory: Denies SOB, DOE, cough, chest tightness,  and wheezing.   Cardiovascular: Denies chest pain, palpitations and leg swelling.  Gastrointestinal: Denies nausea, vomiting, abdominal pain, diarrhea, constipation, blood in stool and abdominal distention.  Genitourinary: Denies dysuria, urgency, frequency, hematuria, flank pain and difficulty urinating.  Endocrine: Denies: hot or cold intolerance, sweats, changes in hair or nails, polyuria, polydipsia. Musculoskeletal: Denies myalgias, back pain, joint swelling, arthralgias and gait problem.  Skin: Denies pallor, rash and wound.  Neurological: Denies dizziness, seizures, syncope, weakness, light-headedness, numbness and headaches.  Hematological: Denies adenopathy. Easy bruising, personal or family bleeding history  Psychiatric/Behavioral: Denies suicidal ideation, mood changes, confusion, nervousness, sleep disturbance and agitation    Physical Exam: Vitals:   01/09/21 1330  BP: 120/78  Pulse: 76  Temp: 98.4 F (36.9 C)  TempSrc: Oral  SpO2: 96%  Weight: 200 lb 3.2 oz (90.8 kg)  Height: 5' 7"  (1.702 m)    Body mass index is 31.36 kg/m.   Constitutional: NAD, calm, comfortable Eyes: PERRL, lids and conjunctivae normal, wears corrective lenses ENMT: Mucous  membranes are moist. Posterior pharynx clear of any exudate or lesions. Normal dentition. Tympanic membrane is pearly white, no erythema or bulging on the left, right is obstructed by cerumen Neck: normal, supple, no masses, no thyromegaly Respiratory: clear to auscultation bilaterally, no wheezing, no crackles. Normal respiratory effort. No accessory muscle use.  Cardiovascular: Regular rate and rhythm, no murmurs / rubs / gallops. No extremity edema. 2+ pedal pulses. No carotid bruits.  Abdomen: no tenderness, no masses palpated. No hepatosplenomegaly. Bowel sounds positive.  Musculoskeletal: no clubbing / cyanosis. No joint deformity upper and lower extremities. Good ROM, no contractures. Normal muscle tone.  Skin: no rashes, lesions, ulcers. No induration Neurologic: CN 2-12 grossly intact. Sensation intact, DTR normal. Strength 5/5 in all 4.  Psychiatric: Normal judgment and insight. Alert and oriented x 3. Normal mood.    Subsequent Medicare wellness visit   1. Risk factors, based on past  M,S,F -cardiovascular disease risk factors include age, gender, history of diabetes, history of hyperlipidemia, history of hypertension, prior history of coronary artery disease.   2.  Physical activities: Walks on a daily basis   3.  Depression/mood:  Stable, not depressed   4.  Hearing:  No perceived hearing difficulties   5.  ADL's: Independent in all ADLs   6.  Fall risk:  Low fall risk   7.  Home safety: No problems identified   8.  Height weight, and visual acuity: height and weight as above, vision:   Visual Acuity Screening   Right eye Left eye Both eyes  Without correction:     With correction: 20/20 20/32 20/20      9.  Counseling:  All preventive measures are up-to-date and age-appropriate   10. Lab orders based on risk factors: Laboratory update will be reviewed   11. Referral :  None today   12. Care plan:  Follow-up with me in 6 to 12 months   13. Cognitive assessment:   No cognitive impairment   14. Screening: Patient provided with a written and personalized 5-10 year screening schedule in the AVS.   yes   15. Provider List Update:   PCP, cardiology [name]  16. Advance Directives: Full code   17. Opioids: Patient is not on any opioid prescriptions and has no risk factors for a  substance use disorder.   [place] Office Visit from 01/09/2021 in [facility] [place]  PHQ-9 Total Score 0      Fall Risk  01/09/2021 12/23/2019 12/16/2018 12/03/2018 11/20/2018  Falls in the past year? 0 0 - 0 0  Number falls in past yr: 0 0 0 - 0  Injury with Fall? 0 0 0 - 0     Impression and Plan:  Encounter for preventive health examination  -He has routine eye and dental care. -Healthy lifestyle discussed in detail. -All immunizations are up-to-date and age-appropriate. -Screening labs today. -He had a colonoscopy in 2022. -PSA today.  Right ear impacted cerumen -Cerumen Desimpaction  After patient consent was obtained, warm water was applied and gentle ear lavage performed on right ear. There were no complications and following the desimpaction the tympanic membranes were visible. Tympanic membranes are intact following the procedure. Auditory canals are normal. The patient reported relief of symptoms after removal of cerumen.  Low testosterone -On testosterone supplementation, check PSA as well as hemoglobin as he has had hyperviscosity in the past.  Essential hypertension  -Well-controlled.  Dyslipidemia  - Plan: Lipid panel  Gastroesophageal reflux disease without esophagitis -Well-controlled on daily PPI therapy  Cigarette nicotine dependence without complication -I have discussed tobacco cessation with the patient.  I have counseled the patient regarding the negative impacts of continued tobacco use including but not limited to lung cancer, COPD, and cardiovascular disease.  I have discussed alternatives to tobacco and modalities  that may help facilitate tobacco cessation including but not limited to biofeedback, hypnosis, and medications.  Total time spent with tobacco counseling was 3 minutes. -He is not interested in smoking cessation today, continue to address at subsequent visits.  NASH (nonalcoholic steatohepatitis) -Check LFTs today.  Screening for prostate cancer  - Plan: PSA  Coronary artery disease due to lipid rich plaque -Stable, followed by cardiology  Type 2 diabetes mellitus without complication, without long-term current use of insulin (HCC)  -Check A1c, last was well controlled at 6.2, followed by endocrinology.   Patient Instructions   -Nice seeing you today!!  -Lab work today; will notify you once results are available.  -debrox ear drops: 2 drops each ear at bedtime.  -Schedule follow up in 6 months or sooner as needed.   Preventive Care 28 Years and Older, Male Preventive care refers to lifestyle choices and visits with your health care provider that can promote health and wellness. This includes:  A yearly physical exam. This is also called an annual wellness visit.  Regular dental and eye exams.  Immunizations.  Screening for certain conditions.  Healthy lifestyle choices, such as: ? Eating a healthy diet. ? Getting regular exercise. ? Not using drugs or products that contain nicotine and tobacco. ? Limiting alcohol use. What can I expect for my preventive care visit? Physical exam Your health care provider will check your:  Height and weight. These may be used to calculate your BMI (body mass index). BMI is a measurement that tells if you are at a healthy weight.  Heart rate and blood pressure.  Body temperature.  Skin for abnormal spots. Counseling Your health care provider may ask you questions about your:  Past medical problems.  Family's medical history.  Alcohol, tobacco, and drug use.  Emotional well-being.  Home life and relationship  well-being.  Sexual activity.  Diet, exercise, and sleep habits.  History of falls.  Memory and ability to understand (cognition).  Work and work  environment.  Access to firearms. What immunizations do I need? Vaccines are usually given at various ages, according to a schedule. Your health care provider will recommend vaccines for you based on your age, medical history, and lifestyle or other factors, such as travel or where you work.   What tests do I need? Blood tests  Lipid and cholesterol levels. These may be checked every 5 years, or more often depending on your overall health.  Hepatitis C test.  Hepatitis B test. Screening  Lung cancer screening. You may have this screening every year starting at age 5 if you have a 30-pack-year history of smoking and currently smoke or have quit within the past 15 years.  Colorectal cancer screening. ? All adults should have this screening starting at age 25 and continuing until age 60. ? Your health care provider may recommend screening at age 79 if you are at increased risk. ? You will have tests every 1-10 years, depending on your results and the type of screening test.  Prostate cancer screening. Recommendations will vary depending on your family history and other risks.  Genital exam to check for testicular cancer or hernias.  Diabetes screening. ? This is done by checking your blood sugar (glucose) after you have not eaten for a while (fasting). ? You may have this done every 1-3 years.  Abdominal aortic aneurysm (AAA) screening. You may need this if you are a current or former smoker.  STD (sexually transmitted disease) testing, if you are at risk. Follow these instructions at home: Eating and drinking  Eat a diet that includes fresh fruits and vegetables, whole grains, lean protein, and low-fat dairy products. Limit your intake of foods with high amounts of sugar, saturated fats, and salt.  Take vitamin and mineral  supplements as recommended by your health care provider.  Do not drink alcohol if your health care provider tells you not to drink.  If you drink alcohol: ? Limit how much you have to 0-2 drinks a day. ? Be aware of how much alcohol is in your drink. In the [place] one drink equals one 12 oz bottle of beer (355 mL), one 5 oz glass of wine (148 mL), or one 1 oz glass of hard liquor (44 mL).   Lifestyle  Take daily care of your teeth and gums. Brush your teeth every morning and night with fluoride toothpaste. Floss one time each day.  Stay active. Exercise for at least 30 minutes 5 or more days each week.  Do not use any products that contain nicotine or tobacco, such as cigarettes, e-cigarettes, and chewing tobacco. If you need help quitting, ask your health care provider.  Do not use drugs.  If you are sexually active, practice safe sex. Use a condom or other form of protection to prevent STIs (sexually transmitted infections).  Talk with your health care provider about taking a low-dose aspirin or statin.  Find healthy ways to cope with stress, such as: ? Meditation, yoga, or listening to music. ? Journaling. ? Talking to a trusted person. ? Spending time with friends and family. Safety  Always wear your seat belt while driving or riding in a vehicle.  Do not drive: ? If you have been drinking alcohol. Do not ride with someone who has been drinking. ? When you are tired or distracted. ? While texting.  Wear a helmet and other protective equipment during sports activities.  If you have firearms in your house, make sure you follow all  gun safety procedures. What's next?  Visit your health care provider once a year for an annual wellness visit.  Ask your health care provider how often you should have your eyes and teeth checked.  Stay up to date on all vaccines. This information is not intended to replace advice given to you by your health care provider. Make sure you  discuss any questions you have with your health care provider. Document Revised: 07/27/2019 Document Reviewed: 10/22/2018 Elsevier Patient Education  2021 [place], MD [facility]

## 2021-01-11 ENCOUNTER — Other Ambulatory Visit: Payer: Self-pay | Admitting: Cardiology

## 2021-01-11 ENCOUNTER — Other Ambulatory Visit: Payer: Self-pay | Admitting: Internal Medicine

## 2021-01-11 DIAGNOSIS — E785 Hyperlipidemia, unspecified: Secondary | ICD-10-CM

## 2021-01-16 NOTE — Progress Notes (Signed)
Please call patient and let them  know their  low dose Ct was read as a Lung RADS 2: nodules that are benign in appearance and behavior with a very low likelihood of becoming a clinically active cancer due to size or lack of growth. Recommendation per radiology is for a repeat LDCT in 12 months. .Please let them  know we will order and schedule their  annual screening scan for 12/2021 Please let them  know there was notation of CAD on their  scan.  Please remind the patient  that this is a non-gated exam therefore degree or severity of disease  cannot be determined. Please have them  follow up with their PCP regarding potential risk factor modification, dietary therapy or pharmacologic therapy if clinically indicated. Pt.  is  currently on statin therapy. Please place order for annual  screening scan for 12/2021 and fax results to PCP. Thanks so much.

## 2021-01-18 ENCOUNTER — Other Ambulatory Visit: Payer: Self-pay | Admitting: Internal Medicine

## 2021-01-18 ENCOUNTER — Telehealth: Payer: Self-pay | Admitting: Acute Care

## 2021-01-18 DIAGNOSIS — Z87891 Personal history of nicotine dependence: Secondary | ICD-10-CM

## 2021-01-18 DIAGNOSIS — F17210 Nicotine dependence, cigarettes, uncomplicated: Secondary | ICD-10-CM

## 2021-01-18 DIAGNOSIS — R7989 Other specified abnormal findings of blood chemistry: Secondary | ICD-10-CM

## 2021-01-18 NOTE — Telephone Encounter (Signed)
Pt informed of CT results per Sarah Groce, NP.  PT verbalized understanding.  Copy sent to PCP.  Order placed for 1 yr f/u CT.  

## 2021-01-24 NOTE — Telephone Encounter (Signed)
Patient is calling back and stated that the pharmacy needed clarification on Testerone gel, please advise. CB is [phone]

## 2021-01-25 MED ORDER — TESTOSTERONE 25 MG/2.5GM (1%) TD GEL: TRANSDERMAL | 3 refills | Status: DC

## 2021-01-25 NOTE — Addendum Note (Signed)
Addended by: Westley Hummer B on: 01/25/2021 11:18 AM   Modules accepted: Orders

## 2021-01-25 NOTE — Telephone Encounter (Signed)
Spoke with the pharmacist and the prescription should be for 2 boxes not 1 packet.

## 2021-02-08 ENCOUNTER — Other Ambulatory Visit: Payer: Self-pay | Admitting: Cardiology

## 2021-02-08 MED ORDER — ICOSAPENT ETHYL 1 G PO CAPS: 2.0000 g | ORAL_CAPSULE | Freq: Two times a day (BID) | ORAL | 2 refills | Status: DC

## 2021-02-21 ENCOUNTER — Other Ambulatory Visit: Payer: Self-pay | Admitting: Endocrinology

## 2021-03-18 ENCOUNTER — Other Ambulatory Visit: Payer: Self-pay | Admitting: Endocrinology

## 2021-03-18 NOTE — Progress Notes (Signed)
Cardiology Office Note:    Date:  03/20/2021   ID:  [name] [name], DOB [birth year], MRN [id number]  PCP:  Isaac Bliss, Rayford Halsted, MD   [facility] Providers Cardiologist:  None {   Referring MD: Isaac Bliss, [name]*    History of Present Illness:    [name] [name] is a 70 y.o. male with a hx of CAD s/p PCI following positive stress test in 2006, HTN, HLD, DMII and tobacco use who was previously followed by [name] who now returns to clinic for follow-up.  Last saw [name] on 09/2020 where he was doing well. Compliant with all medications. Had atypical chest pain thought to be related to GERD.  Today, the patient states he overall feels well. No chest pain, SOB, lightheadedness, orthopnea or PND. No LE edema. Compliant with all medications. Blood pressure well controlled. Continues to smoke but is not interested in stopping.   Past Medical History:  Diagnosis Date  . Anemia, iron deficiency 11/18/2008   Resolved, despite no rx--2012    . ANEMIA-IRON DEFICIENCY   . Arthritis   . BACK PAIN, LUMBAR   . Blood transfusion without reported diagnosis   . CORONARY ARTERY DISEASE   . DIVERTICULOSIS, COLON   . DM    on no medication ?prediabetes  . Elevated LFTs 11/18/2015  . GERD   . Hepatitis    States he was tested for Hep B and he had antibodies  . HYPERLIPIDEMIA   . HYPERTENSION    dr tom wall  . Nephrolithiasis   . PONV (postoperative nausea and vomiting)    little nausea after having his hip replacement  . Unspecified disorder of urethra and urinary tract     Past Surgical History:  Procedure Laterality Date  . COLONOSCOPY    . CORONARY ANGIOPLASTY WITH STENT PLACEMENT     06    dr t. wall  . ELECTROCARDIOGRAM  03/30/2007  . ESOPHAGOGASTRODUODENOSCOPY  11/07/2005  . kidney stones     in office procedure to remove but no sedation surgeries for kidney stones   . LUMBAR LAMINECTOMY Right 05/18/2014   Procedure: LUMBAR LAMINECTOMY Microdiscectomy Right  Lumbar five - sacral one;  Surgeon: [name], MD;  Location: [place];  Service: Orthopedics;  Laterality: Right;  . Stress Cardiolite  11/30/2004  . TOTAL HIP ARTHROPLASTY  10/28/2012   Procedure: TOTAL HIP ARTHROPLASTY;  Surgeon: [name], MD;  Location: [place];  Service: Orthopedics;  Laterality: Right;  . UPPER GASTROINTESTINAL ENDOSCOPY      Current Medications: Current Meds  Medication Sig  . Ascorbic Acid (VITAMIN C) 1000 MG tablet Take 2,000 mg by mouth daily.  Marland Kitchen aspirin EC 81 MG tablet Take 1 tablet (81 mg total) by mouth daily.  Marland Kitchen atenolol (TENORMIN) 25 MG tablet TAKE 1 TABLET BY MOUTH EVERY DAY  . b complex vitamins tablet Take 1 tablet by mouth daily.  Marland Kitchen BIOTIN PO Take by mouth. 568mg once daily  . Cholecalciferol (VITAMIN D) 2000 UNITS tablet Take 2,000 Units by mouth daily.  . Coenzyme Q10 (CO Q 10) 100 MG CAPS Take 200 mg by mouth daily.   . Cyanocobalamin (VITAMIN B-12 PO) Take 1 tablet by mouth daily.  . dapagliflozin propanediol (FARXIGA) 10 MG TABS tablet Take 1 tablet (10 mg total) by mouth daily before breakfast.  . FOLIC ACID PO Take 1 tablet by mouth daily.  . Ginkgo Biloba (GINKOBA PO) Take 1 tablet by mouth daily.  .Marland Kitchen  icosapent Ethyl (VASCEPA) 1 g capsule Take 2 capsules (2 g total) by mouth 2 (two) times daily.  Marland Kitchen lisinopril (ZESTRIL) 40 MG tablet TAKE 1 TABLET BY MOUTH EVERY DAY  . Magnesium 400 MG CAPS Take 400 mg by mouth daily.   . metFORMIN (GLUCOPHAGE-XR) 500 MG 24 hr tablet TAKE 2 TABLETS BY MOUTH EVERY DAY  . Multiple Vitamin (MULTIVITAMIN) tablet Take 1 tablet by mouth daily.  . nitroGLYCERIN (NITROSTAT) 0.4 MG SL tablet Place 1 tablet (0.4 mg total) under the tongue every 5 (five) minutes as needed for chest pain.  Glory Rosebush VERIO test strip 1 EACH BY OTHER ROUTE DAILY. USE AS INSTRUCTED  . pantoprazole (PROTONIX) 40 MG tablet Take 40 mg by mouth daily as needed.  Marland Kitchen REPATHA SURECLICK 646 MG/ML SOAJ INJECT 1 PEN INTO THE SKIN EVERY 14 (FOURTEEN)  DAYS.  Marland Kitchen rosuvastatin (CRESTOR) 5 MG tablet TAKE 1 TABLET (5 MG TOTAL) BY MOUTH DAILY AT 6 PM.  . Semaglutide (RYBELSUS) 7 MG TABS Take 7 mg by mouth daily.  . Testosterone 25 MG/2.5GM (1%) GEL PLACE 50 MG (TWO PACKETS) ONTO THE SKIN DAILY.  Marland Kitchen VITAMIN A PO Take 1 tablet by mouth every other day.   . vitamin E 100 UNIT capsule Take 100 Units by mouth daily.  . Zinc 50 MG TABS Take 50 mg by mouth daily.     Allergies:   Actos [pioglitazone hydrochloride]   Social History   Socioeconomic History  . Marital status: Single    Spouse name: Not on file  . Number of children: 0  . Years of education: Not on file  . Highest education level: Not on file  Occupational History  . 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 Occupation: Retired  Tobacco Use  . Smoking status: Current Every Day Smoker    Packs/day: 1.00    Years: 48.00    Pack years: 48.00    Types: Cigarettes  . Smokeless tobacco: Never Used  . Tobacco comment: states he wants to reduce his dependence.  Vaping Use  . Vaping Use: Never used  Substance and Sexual Activity  . Alcohol use: Yes    Alcohol/week: 0.0 standard drinks    Comment: rare  . Drug use: No  . Sexual activity: Not Currently  Other Topics Concern  . Not on file  Social History Narrative   Originally from [place], [place]   Lives alone with dog, has a couple of friends he can rely on for support.    Social Determinants of Health   Financial Resource Strain: Low Risk   . Difficulty of Paying Living Expenses: Not hard at all  Food Insecurity: Not on file  Transportation Needs: No Transportation Needs  . Lack of Transportation (Medical): No  . Lack of Transportation (Non-Medical): No  Physical Activity: Not on file  Stress: Not on file  Social Connections: Not on file     Family History: The patient's family history includes Breast cancer in his sister; Colon cancer in his mother; Heart disease in  his father. There is no history of Esophageal cancer, Rectal cancer, or Stomach cancer.  ROS:   Please see the history of present illness.    Review of Systems  Constitutional: Negative for chills and fever.  HENT: Negative for hearing loss.   Eyes: Negative for blurred vision and redness.  Respiratory: Positive for cough.   Cardiovascular: Negative for chest pain, palpitations,  orthopnea, claudication, leg swelling and PND.  Gastrointestinal: Negative for melena, nausea and vomiting.  Genitourinary: Negative for dysuria and flank pain.  Musculoskeletal: Positive for joint pain.  Neurological: Negative for dizziness and loss of consciousness.  Endo/Heme/Allergies: Negative for polydipsia.  Psychiatric/Behavioral: Negative for memory loss.    EKGs/Labs/Other Studies Reviewed:    The following studies were reviewed today: Myoview 08/2019:  Inconclusive ECG with significant motion artifact. Diffuse upsloping ST depressions, appear to become horizontal in V3 and V4 leads.  Nuclear stress EF: 60%.  The left ventricular ejection fraction is normal (55-65%).  Defect 1: There is a medium fixed defect of mild severity present in the basal inferior, mid inferior and apical inferior location. Given normal wall motion in this region, consistent with artifact  The study is normal.  This is a low risk study.    Recent Labs: 01/09/2021: ALT 28; BUN 19; Creatinine, Ser 0.86; Hemoglobin 13.1; Platelets 265.0; Potassium 4.7; Sodium 142; TSH 1.71  Recent Lipid Panel    Component Value Date/Time   CHOL 88 01/09/2021 1415   CHOL 86 (L) 09/11/2020 1456   TRIG 134.0 01/09/2021 1415   HDL 59.60 01/09/2021 1415   HDL 57 09/11/2020 1456   CHOLHDL 1 01/09/2021 1415   VLDL 26.8 01/09/2021 1415   LDLCALC 1 01/09/2021 1415   LDLCALC 11 09/11/2020 1456   LDLDIRECT 118 (H) 08/23/2019 1552   LDLDIRECT 125.0 12/16/2018 1246     Physical Exam:    VS:  BP 134/74   Pulse 70   Ht 5' 7"  (1.702 m)    Wt 202 lb 3.2 oz (91.7 kg)   SpO2 95%   BMI 31.67 kg/m     Wt Readings from Last 3 Encounters:  03/20/21 202 lb 3.2 oz (91.7 kg)  01/09/21 200 lb 3.2 oz (90.8 kg)  12/19/20 198 lb (89.8 kg)     GEN:  Well nourished, well developed in no acute distress HEENT: Normal NECK: No JVD; No carotid bruits CARDIAC: RRR, no murmurs, rubs, gallops RESPIRATORY:  Trace expiratory wheezing ABDOMEN: Soft, non-tender, non-distended MUSCULOSKELETAL:  No edema; No deformity  SKIN: Warm and dry NEUROLOGIC:  Alert and oriented x 3 PSYCHIATRIC:  Normal affect   ASSESSMENT:    1. Coronary artery disease involving native coronary artery of native heart without angina pectoris   2. RBBB   3. Essential hypertension   4. S/P PTCA (percutaneous transluminal coronary angioplasty)   5. Type 2 diabetes mellitus without complication, without long-term current use of insulin ([place])   6. Dyslipidemia   7. Statin intolerance   8. TOBACCO USER    PLAN:    In order of problems listed above:   #CAD s/p remote PCI in 2006: Unknown artery intervened upon. Stress test negative in 2020 with no ischemia or scar, LVEF 60-65%. Doing well with no anginal symptoms. Tolerating all medications as prescribed. -Continue vascepa, repatha -Continue ASA -Continue atenolol 2m daily -Continue lisinopril 424mdaily  #HTN: Well controlled.  -Continue atenolol 2528maily -Continue lisinopril 60m39mily  #HLD: Well controlled.  -Continue repatha, low dose crestor 5mg 80mly, and vascepa  #DMII: -Continue metformin and farxiga  #Tobacco use: -Not interested in quitting -Annual CT scans   Medication Adjustments/Labs and Tests Ordered: Current medicines are reviewed at length with the patient today.  Concerns regarding medicines are outlined above.  No orders of the defined types were placed in this encounter.  No orders of the defined types were placed in this encounter.  Patient Instructions  Medication  Instructions:   Your physician recommends that you continue on your current medications as directed. Please refer to the Current Medication list given to you today.  *If you need a refill on your cardiac medications before your next appointment, please call your pharmacy*   Follow-Up: At [facility], you and your health needs are our priority.  As part of our continuing mission to provide you with exceptional heart care, we have created designated Provider Care Teams.  These Care Teams include your primary Cardiologist (physician) and Advanced Practice Providers (APPs -  Physician Assistants and Nurse Practitioners) who all work together to provide you with the care you need, when you need it.  We recommend signing up for the patient portal called "MyChart".  Sign up information is provided on this After Visit Summary.  MyChart is used to connect with patients for Virtual Visits (Telemedicine).  Patients are able to view lab/test results, encounter notes, upcoming appointments, etc.  Non-urgent messages can be sent to your provider as well.   To learn more about what you can do with MyChart, go to [email or web address].    Your next appointment:   6 month(s)  The format for your next appointment:   In Person  Provider:   You will see one of the following Advanced Practice Providers on your designated Care Team:    Richardson Dopp, PA-C  Vin Denair, [place]  Cecilie Kicks NP  Kathyrn Drown NP  Dayna Dunn PA-C  [facility] PA-C       Signed, Freada Bergeron, MD  03/20/2021 5:01 PM    [facility]

## 2021-03-20 ENCOUNTER — Encounter: Payer: Self-pay | Admitting: Cardiology

## 2021-03-20 ENCOUNTER — Other Ambulatory Visit: Payer: Self-pay

## 2021-03-20 ENCOUNTER — Ambulatory Visit (INDEPENDENT_AMBULATORY_CARE_PROVIDER_SITE_OTHER): Payer: Medicare Other | Admitting: Cardiology

## 2021-03-20 VITALS — BP 134/74 | HR 70 | Ht 67.0 in | Wt 202.2 lb

## 2021-03-20 DIAGNOSIS — Z789 Other specified health status: Secondary | ICD-10-CM | POA: Diagnosis not present

## 2021-03-20 DIAGNOSIS — F17200 Nicotine dependence, unspecified, uncomplicated: Secondary | ICD-10-CM

## 2021-03-20 DIAGNOSIS — I10 Essential (primary) hypertension: Secondary | ICD-10-CM | POA: Diagnosis not present

## 2021-03-20 DIAGNOSIS — E119 Type 2 diabetes mellitus without complications: Secondary | ICD-10-CM

## 2021-03-20 DIAGNOSIS — I4510 Unspecified right bundle-branch block: Secondary | ICD-10-CM

## 2021-03-20 DIAGNOSIS — Z9861 Coronary angioplasty status: Secondary | ICD-10-CM

## 2021-03-20 DIAGNOSIS — I2510 Atherosclerotic heart disease of native coronary artery without angina pectoris: Secondary | ICD-10-CM

## 2021-03-20 DIAGNOSIS — E785 Hyperlipidemia, unspecified: Secondary | ICD-10-CM

## 2021-03-20 NOTE — Patient Instructions (Signed)
Medication Instructions:   Your physician recommends that you continue on your current medications as directed. Please refer to the Current Medication list given to you today.  *If you need a refill on your cardiac medications before your next appointment, please call your pharmacy*   Follow-Up: At [facility], you and your health needs are our priority.  As part of our continuing mission to provide you with exceptional heart care, we have created designated Provider Care Teams.  These Care Teams include your primary Cardiologist (physician) and Advanced Practice Providers (APPs -  Physician Assistants and Nurse Practitioners) who all work together to provide you with the care you need, when you need it.  We recommend signing up for the patient portal called "MyChart".  Sign up information is provided on this After Visit Summary.  MyChart is used to connect with patients for Virtual Visits (Telemedicine).  Patients are able to view lab/test results, encounter notes, upcoming appointments, etc.  Non-urgent messages can be sent to your provider as well.   To learn more about what you can do with MyChart, go to [email or web address].    Your next appointment:   6 month(s)  The format for your next appointment:   In Person  Provider:   You will see one of the following Advanced Practice Providers on your designated Care Team:    Richardson Dopp, PA-C  [place], [place]  Cecilie Kicks NP  Kathyrn Drown NP  Dayna Dunn PA-C  Ermalinda Barrios PA-C

## 2021-03-22 ENCOUNTER — Ambulatory Visit (INDEPENDENT_AMBULATORY_CARE_PROVIDER_SITE_OTHER): Payer: Medicare Other | Admitting: Endocrinology

## 2021-03-22 ENCOUNTER — Other Ambulatory Visit: Payer: Self-pay

## 2021-03-22 VITALS — BP 144/84 | HR 77 | Ht 67.0 in | Wt 202.6 lb

## 2021-03-22 DIAGNOSIS — E119 Type 2 diabetes mellitus without complications: Secondary | ICD-10-CM | POA: Diagnosis not present

## 2021-03-22 LAB — POCT GLYCOSYLATED HEMOGLOBIN (HGB A1C): Hemoglobin A1C: 6.8 % — AB (ref 4.0–5.6)

## 2021-03-22 MED ORDER — METFORMIN HCL ER 500 MG PO TB24: 1000.0000 mg | ORAL_TABLET | Freq: Every day | ORAL | 3 refills | Status: DC

## 2021-03-22 MED ORDER — RYBELSUS 14 MG PO TABS: 14.0000 mg | ORAL_TABLET | Freq: Every day | ORAL | 3 refills | Status: DC

## 2021-03-22 NOTE — Progress Notes (Signed)
Subjective:    Patient ID: [name] [name], male    DOB: 22-Oct-1951, 70 y.o.   MRN: [id number]  HPI Pt returns for f/u of DM. DM type: 2 Dx'ed: 7035 Complications: CAD Therapy: 3 oral meds.   DKA: never Severe hypoglycemia: never.  Pancreatitis: never Other: he has never been on insulin; pioglitizone was favored due to NASH, but he did not tolerate (weight gain).  Interval history: he says cbg's are well-controlled.  pt states he feels well in general.  He takes meds as rx'ed.  Past Medical History:  Diagnosis Date  . Anemia, iron deficiency 11/18/2008   Resolved, despite no rx--2012    . ANEMIA-IRON DEFICIENCY   . Arthritis   . BACK PAIN, LUMBAR   . Blood transfusion without reported diagnosis   . CORONARY ARTERY DISEASE   . DIVERTICULOSIS, COLON   . DM    on no medication ?prediabetes  . Elevated LFTs 11/18/2015  . GERD   . Hepatitis    States he was tested for Hep B and he had antibodies  . HYPERLIPIDEMIA   . HYPERTENSION    dr tom wall  . Nephrolithiasis   . PONV (postoperative nausea and vomiting)    little nausea after having his hip replacement  . Unspecified disorder of urethra and urinary tract     Past Surgical History:  Procedure Laterality Date  . COLONOSCOPY    . CORONARY ANGIOPLASTY WITH STENT PLACEMENT     06    dr t. wall  . ELECTROCARDIOGRAM  03/30/2007  . ESOPHAGOGASTRODUODENOSCOPY  11/07/2005  . kidney stones     in office procedure to remove but no sedation surgeries for kidney stones   . LUMBAR LAMINECTOMY Right 05/18/2014   Procedure: LUMBAR LAMINECTOMY Microdiscectomy Right Lumbar five - sacral one;  Surgeon: [name], MD;  Location: [place];  Service: Orthopedics;  Laterality: Right;  . Stress Cardiolite  11/30/2004  . TOTAL HIP ARTHROPLASTY  10/28/2012   Procedure: TOTAL HIP ARTHROPLASTY;  Surgeon: [name], MD;  Location: [place];  Service: Orthopedics;  Laterality: Right;  . UPPER GASTROINTESTINAL ENDOSCOPY      Social History    Socioeconomic History  . Marital status: Single    Spouse name: Not on file  . Number of children: 0  . Years of education: Not on file  . Highest education level: Not on file  Occupational History  . 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 Occupation: Retired  Tobacco Use  . Smoking status: Current Every Day Smoker    Packs/day: 1.00    Years: 48.00    Pack years: 48.00    Types: Cigarettes  . Smokeless tobacco: Never Used  . Tobacco comment: states he wants to reduce his dependence.  Vaping Use  . Vaping Use: Never used  Substance and Sexual Activity  . Alcohol use: Yes    Alcohol/week: 0.0 standard drinks    Comment: rare  . Drug use: No  . Sexual activity: Not Currently  Other Topics Concern  . Not on file  Social History Narrative   Originally from [place], [place]   Lives alone with dog, has a couple of friends he can rely on for support.    Social Determinants of Health   Financial Resource Strain: Low Risk   . Difficulty of Paying Living Expenses: Not hard at all  Food Insecurity: Not on file  Transportation  Needs: No Transportation Needs  . Lack of Transportation (Medical): No  . Lack of Transportation (Non-Medical): No  Physical Activity: Not on file  Stress: Not on file  Social Connections: Not on file  Intimate Partner Violence: Not on file    Current Outpatient Medications on File Prior to Visit  Medication Sig Dispense Refill  . Ascorbic Acid (VITAMIN C) 1000 MG tablet Take 2,000 mg by mouth daily.    Marland Kitchen aspirin EC 81 MG tablet Take 1 tablet (81 mg total) by mouth daily. 30 tablet 11  . atenolol (TENORMIN) 25 MG tablet TAKE 1 TABLET BY MOUTH EVERY DAY 90 tablet 1  . b complex vitamins tablet Take 1 tablet by mouth daily.    Marland Kitchen BIOTIN PO Take by mouth. 560mg once daily    . Cholecalciferol (VITAMIN D) 2000 UNITS tablet Take 2,000 Units by mouth daily.    . Coenzyme Q10 (CO Q 10) 100 MG CAPS Take 200  mg by mouth daily.     . Cyanocobalamin (VITAMIN B-12 PO) Take 1 tablet by mouth daily.    . dapagliflozin propanediol (FARXIGA) 10 MG TABS tablet Take 1 tablet (10 mg total) by mouth daily before breakfast. 90 tablet 3  . FOLIC ACID PO Take 1 tablet by mouth daily.    . Ginkgo Biloba (GINKOBA PO) Take 1 tablet by mouth daily.    .Marland Kitchenicosapent Ethyl (VASCEPA) 1 g capsule Take 2 capsules (2 g total) by mouth 2 (two) times daily. 360 capsule 2  . lisinopril (ZESTRIL) 40 MG tablet TAKE 1 TABLET BY MOUTH EVERY DAY 90 tablet 3  . Magnesium 400 MG CAPS Take 400 mg by mouth daily.     . Multiple Vitamin (MULTIVITAMIN) tablet Take 1 tablet by mouth daily.    . nitroGLYCERIN (NITROSTAT) 0.4 MG SL tablet Place 1 tablet (0.4 mg total) under the tongue every 5 (five) minutes as needed for chest pain. 30 tablet 11  . ONETOUCH VERIO test strip 1 EACH BY OTHER ROUTE DAILY. USE AS INSTRUCTED 100 strip 0  . pantoprazole (PROTONIX) 40 MG tablet Take 40 mg by mouth daily as needed.    .Marland KitchenREPATHA SURECLICK 1364MG/ML SOAJ INJECT 1 PEN INTO THE SKIN EVERY 14 (FOURTEEN) DAYS. 6 pen 3  . rosuvastatin (CRESTOR) 5 MG tablet TAKE 1 TABLET (5 MG TOTAL) BY MOUTH DAILY AT 6 PM. 90 tablet 1  . Testosterone 25 MG/2.5GM (1%) GEL PLACE 50 MG (TWO PACKETS) ONTO THE SKIN DAILY. 150 g 3  . VITAMIN A PO Take 1 tablet by mouth every other day.     . vitamin E 100 UNIT capsule Take 100 Units by mouth daily.    . Zinc 50 MG TABS Take 50 mg by mouth daily.     Current Facility-Administered Medications on File Prior to Visit  Medication Dose Route Frequency Provider Last Rate Last Admin  . regadenoson (LEXISCAN) injection SOLN 0.4 mg  0.4 mg Intravenous Once SDonato Heinz MD        Allergies  Allergen Reactions  . Actos [Pioglitazone Hydrochloride]     Weight gain    Family History  Problem Relation Age of Onset  . Colon cancer Mother        Colon Cancer-at advanced age- in her 719'sper pt   . Heart disease Father    . Breast cancer Sister   . Esophageal cancer Neg Hx   . Rectal cancer Neg Hx   . Stomach cancer Neg  Hx     BP (!) 144/84 (BP Location: Right Arm, Patient Position: Sitting, Cuff Size: Normal)   Pulse 77   Ht 5' 7"  (1.702 m)   Wt 202 lb 9.6 oz (91.9 kg)   SpO2 96%   BMI 31.73 kg/m   Review of Systems Denies n/v    Objective:   Physical Exam VITAL SIGNS:  See vs page GENERAL: no distress Pulses: dorsalis pedis intact bilat.   MSK: no deformity of the feet CV: no leg edema Skin:  no ulcer on the feet.  normal color and temp on the feet.   Neuro: sensation is intact to touch on the feet.    Lab Results  Component Value Date   HGBA1C 6.8 (A) 03/22/2021       Assessment & Plan:  Type 2 DM: uncontrolled, as goal A1c is low 6's.   Patient Instructions  I have sent a prescription to your pharmacy, to increase the Rybelsus.  Please continue the same other medications. check your blood sugar once a day.  vary the time of day when you check, between before the 3 meals, and at bedtime.  also check if you have symptoms of your blood sugar being too high or too low.  please keep a record of the readings and bring it to your next appointment here (or you can bring the meter itself).  You can write it on any piece of paper.  please call us sooner if your blood sugar goes below 70, or if you have a lot of readings over 200. Please come back for a follow-up appointment in 6 months.

## 2021-03-22 NOTE — Patient Instructions (Addendum)
I have sent a prescription to your pharmacy, to increase the Rybelsus.  Please continue the same other medications. check your blood sugar once a day.  vary the time of day when you check, between before the 3 meals, and at bedtime.  also check if you have symptoms of your blood sugar being too high or too low.  please keep a record of the readings and bring it to your next appointment here (or you can bring the meter itself).  You can write it on any piece of paper.  please call us sooner if your blood sugar goes below 70, or if you have a lot of readings over 200. Please come back for a follow-up appointment in 6 months.

## 2021-04-16 ENCOUNTER — Telehealth: Payer: Self-pay | Admitting: Pharmacist

## 2021-04-16 NOTE — Chronic Care Management (AMB) (Signed)
    Chronic Care Management Pharmacy Assistant   Name: KAYCEN WHITWORTH  MRN: [id number] DOB: 1951-03-07  04/16/21- Called patient to remind of appointment with Jeni Salles) on (04/17/21 by phone at 1pm)   No answer, left message of appointment date, time and type of appointment (either telephone or in person). Left message to have all medications, supplements, blood pressure and/or blood sugar logs available during appointment and to return call if need to reschedule.  Star Rating Drug:   dapagliflozin 53m - last filled on 02/24/21 90DS at [facility] Lisinopril 417m- last filled on [facility]/09/21 [facility]  Metformin 50045m last filled on 02/22/21 30DS at [facility]  Rosuvastatin 5mg13mlast filled on 01/11/21 [facility] at [facility]  semaglutide rybelsus 14mg44mast filled on 02/26/21 30DS at [facility]  Any gaps in medications fill history? No.   [facility] Clinical Pharmacist Assistant [phone]

## 2021-04-17 ENCOUNTER — Ambulatory Visit (INDEPENDENT_AMBULATORY_CARE_PROVIDER_SITE_OTHER): Payer: Medicare Other | Admitting: Pharmacist

## 2021-04-17 DIAGNOSIS — I10 Essential (primary) hypertension: Secondary | ICD-10-CM

## 2021-04-17 DIAGNOSIS — E785 Hyperlipidemia, unspecified: Secondary | ICD-10-CM | POA: Diagnosis not present

## 2021-04-17 NOTE — Progress Notes (Signed)
Chronic Care Management Pharmacy Note  04/23/2021 Name:  [name] [name] MRN:  [id number] DOB:  [birth year]  Summary: A1c not at goal of < 6.5% (per endocrinologist) LDL extremely low  Recommendations/Changes made from today's visit: -Recommend repeat lipid panel to confirm extremely low LDL and consideration of therapy modification -Recommend repeat testosterone level as it has been > 1 year since last lab work -Recommend repeat microalbumin as it has been > 1 year since last repeated  Plan: Follow up on therapy modifications depending on result of lipid panel   Subjective: [name] [name] is an 70 y.o. year old male who is a primary patient of Isaac Bliss, Rayford Halsted, MD.  The CCM team was consulted for assistance with disease management and care coordination needs.    Engaged with patient by telephone for follow up visit in response to provider referral for pharmacy case management and/or care coordination services.   Consent to Services:  The patient was given information about Chronic Care Management services, agreed to services, and gave verbal consent prior to initiation of services.  Please see initial visit note for detailed documentation.   Patient Care Team: Isaac Bliss, Rayford Halsted, MD as PCP - General (Internal Medicine) Verl Blalock, Marijo Conception, MD (Inactive) (Cardiology) Magdalen Spatz, NP as Nurse Practitioner (Pulmonary Disease) Dorothy Spark, MD (Inactive) as Consulting Physician (Cardiology) Viona Gilmore, [facility] as Pharmacist (Pharmacist)  Recent office visits: 01/09/21 Domingo Mend, MD: Patient presented for annual exam. Removed cerumen impaction. A1c increased. Vitamin B12 elevated.  Recent consult visits: 03/22/21 [name], MD (endo): Patient presented for DM follow up. BP elevated in office. A1c decreased to 6.8%. Increased Rybelsus to 14 mg daily.  03/20/21 Gwyndolyn Kaufman, MD (cardiology): Patient presented for CAD follow up. No medication  changes made.  09/19/20 [name], MD (endocrinology): Patient presented for DM follow up. Increased Farxiga to 10 mg daily. Follow up in 5-6 months.  Hospital visits: None in previous 6 months   Objective:  Lab Results  Component Value Date   CREATININE 0.86 01/09/2021   BUN 19 01/09/2021   GFR 88.81 01/09/2021   GFRNONAA 96 09/11/2020   GFRAA 111 09/11/2020   NA 142 01/09/2021   K 4.7 01/09/2021   CALCIUM 9.9 01/09/2021   CO2 29 01/09/2021   GLUCOSE 106 (H) 01/09/2021    Lab Results  Component Value Date/Time   HGBA1C 6.8 (A) 03/22/2021 02:18 PM   HGBA1C 7.1 (H) 01/09/2021 02:15 PM   HGBA1C 6.2 (H) 09/11/2020 02:56 PM   HGBA1C 6.3 02/08/2019 02:12 PM   GFR 88.81 01/09/2021 02:15 PM   GFR 93.51 12/23/2019 02:44 PM   MICROALBUR 0.9 11/19/2016 04:01 PM   MICROALBUR 2.4 (H) 10/18/2015 02:19 PM    Last diabetic Eye exam:  Lab Results  Component Value Date/Time   HMDIABEYEEXA No Retinopathy 07/25/2020 12:00 AM    Last diabetic Foot exam: No results found for: HMDIABFOOTEX   Lab Results  Component Value Date   CHOL 88 01/09/2021   HDL 59.60 01/09/2021   LDLCALC 1 01/09/2021   LDLDIRECT 118 (H) 08/23/2019   TRIG 134.0 01/09/2021   CHOLHDL 1 01/09/2021    Hepatic Function Latest Ref Rng & Units 01/09/2021 09/11/2020 12/23/2019  Total Protein 6.0 - 8.3 g/dL 7.1 7.0 7.5  Albumin 3.5 - 5.2 g/dL 4.6 4.6 4.8  AST 0 - 37 U/L 21 26 60(H)  ALT 0 - 53 U/L 28 28 97(H)  Alk Phosphatase 39 - 117 U/L  47 61 57  Total Bilirubin 0.2 - 1.2 mg/dL 0.3 0.2 0.5  Bilirubin, Direct 0.00 - 0.40 mg/dL - - -    Lab Results  Component Value Date/Time   TSH 1.71 01/09/2021 02:15 PM   TSH 1.980 09/11/2020 02:56 PM    CBC Latest Ref Rng & Units 01/09/2021 09/11/2020 07/24/2020  WBC 4.0 - 10.5 K/uL 9.4 8.3 8.3  Hemoglobin 13.0 - 17.0 g/dL 13.1 14.7 15.3  Hematocrit 39.0 - 52.0 % 40.4 44.8 45.9  Platelets 150.0 - 400.0 K/uL 265.0 273 220    Lab Results  Component Value Date/Time    VD25OH 53.53 01/09/2021 02:15 PM   VD25OH 41.27 12/23/2019 02:44 PM    Clinical ASCVD: Yes  The ASCVD Risk score Mikey Bussing DC Jr., et al., 2013) failed to calculate for the following reasons:   The valid total cholesterol range is 130 to 320 mg/dL    Depression screen [facility] 2/9 01/09/2021 12/23/2019 12/16/2018  Decreased Interest 0 0 0  Down, Depressed, Hopeless 0 0 0  PHQ - 2 Score 0 0 0  Altered sleeping 0 0 -  Tired, decreased energy 0 0 -  Change in appetite 0 0 -  Feeling bad or failure about yourself  0 0 -  Trouble concentrating 0 0 -  Moving slowly or fidgety/restless 0 0 -  Suicidal thoughts 0 0 -  PHQ-9 Score 0 0 -  Difficult doing work/chores Not difficult at all Not difficult at all -  Some recent data might be hidden      Social History   Tobacco Use  Smoking Status Every Day   Packs/day: 1.00   Years: 48.00   Pack years: 48.00   Types: Cigarettes  Smokeless Tobacco Never  Tobacco Comments   states he wants to reduce his dependence.   BP Readings from Last 3 Encounters:  03/22/21 (!) 144/84  03/20/21 134/74  01/09/21 120/78   Pulse Readings from Last 3 Encounters:  03/22/21 77  03/20/21 70  01/09/21 76   Wt Readings from Last 3 Encounters:  03/22/21 202 lb 9.6 oz (91.9 kg)  03/20/21 202 lb 3.2 oz (91.7 kg)  01/09/21 200 lb 3.2 oz (90.8 kg)   BMI Readings from Last 3 Encounters:  03/22/21 31.73 kg/m  03/20/21 31.67 kg/m  01/09/21 31.36 kg/m    Assessment/Interventions: Review of patient past medical history, allergies, medications, health status, including review of consultants reports, laboratory and other test data, was performed as part of comprehensive evaluation and provision of chronic care management services.   SDOH:  (Social Determinants of Health) assessments and interventions performed: No  SDOH Screenings   Alcohol Screen: Not on file  Depression (PHQ2-9): Low Risk    PHQ-2 Score: 0  Financial Resource Strain: Low Risk    Difficulty  of Paying Living Expenses: Not hard at all  Food Insecurity: Not on file  Housing: Not on file  Physical Activity: Not on file  Social Connections: Not on file  Stress: Not on file  Tobacco Use: High Risk   Smoking Tobacco Use: Every Day   Smokeless Tobacco Use: Never  Transportation Needs: No Transportation Needs   Lack of Transportation (Medical): No   Lack of Transportation (Non-Medical): No    CCM Care Plan  Allergies  Allergen Reactions   Actos [Pioglitazone Hydrochloride]     Weight gain    Medications Reviewed Today     Reviewed by Casandra Doffing, CMA (Certified Medical Assistant) on 03/22/21 at 1407  Med List Status: <None>   Medication Order Taking? Sig Documenting Provider Last Dose Status Informant  Ascorbic Acid (VITAMIN C) 1000 MG tablet [id number] Yes Take 2,000 mg by mouth daily. [provider] Taking Active Self  aspirin EC 81 MG tablet [id number] Yes Take 1 tablet (81 mg total) by mouth daily. Isaac Bliss, Rayford Halsted, MD Taking Active   atenolol (TENORMIN) 25 MG tablet [id number] Yes TAKE 1 TABLET BY MOUTH EVERY DAY Dorothy Spark, MD Taking Active   b complex vitamins tablet [id number] Yes Take 1 tablet by mouth daily. [provider] Taking Active   BIOTIN PO [id number] Yes Take by mouth. 559mg once daily [provider] Taking Active Self  Cholecalciferol (VITAMIN D) 2000 UNITS tablet [id number] Take 2,000 Units by mouth daily. [provider] Taking Active Self  Coenzyme Q10 (CO Q 10) 100 MG CAPS [id number] Take 200 mg by mouth daily.  [provider] Taking Active Self  Cyanocobalamin (VITAMIN B-12 PO) [id number] Take 1 tablet by mouth daily. [provider] Taking Active   dapagliflozin propanediol (FARXIGA) 10 MG TABS tablet [id number] Take 1 tablet (10 mg total) by mouth daily before breakfast. ERenato Shin MD Taking Active   FOLIC ACID PO [id number] Take 1 tablet by mouth daily.  [provider] Taking Active   Ginkgo Biloba (GINKOBA PO) [id number] Take 1 tablet by mouth daily. [provider] Taking Active   icosapent Ethyl (VASCEPA) 1 g capsule [id number] Take 2 capsules (2 g total) by mouth 2 (two) times daily. NDorothy Spark MD Taking Active   lisinopril (ZESTRIL) 40 MG tablet [id number] TAKE 1 TABLET BY MOUTH EVERY DAY NDorothy Spark MD Taking Active   Magnesium 400 MG CAPS [id number] Take 400 mg by mouth daily.  [provider] Taking Active Self  metFORMIN (GLUCOPHAGE-XR) 500 MG 24 hr tablet [id number] TAKE 2 TABLETS BY MOUTH EVERY DAY ERenato Shin MD Taking Active   Multiple Vitamin (MULTIVITAMIN) tablet [id number] Take 1 tablet by mouth daily. [provider] Taking Active   nitroGLYCERIN (NITROSTAT) 0.4 MG SL tablet [id number] Place 1 tablet (0.4 mg total) under the tongue every 5 (five) minutes as needed for chest pain. NDorothy Spark MD Taking Active   [facility] test strip [id number] 1 EACH BY OTHER ROUTE DAILY. USE AS INSTRUCTED ERenato Shin MD Taking Active   pantoprazole (PROTONIX) 40 MG tablet [id number] Take 40 mg by mouth daily as needed. [provider] Taking Active   REPATHA SURECLICK 1939MG/ML SOAJ [id number] INJECT 1 PEN INTO THE SKIN EVERY 14 (FOURTEEN) DAYS. NDorothy Spark MD Taking Active   rosuvastatin (CRESTOR) 5 MG tablet [id number] TAKE 1 TABLET (5 MG TOTAL) BY MOUTH DAILY AT 6 PM. HIsaac Bliss ERayford Halsted MD Taking Active   Semaglutide ([facility] 7 MG TABS [id number] Take 7 mg by mouth daily. ERenato Shin MD Taking Active   Testosterone 25 MG/2.5GM (1%) GEL [id number] PLACE 50 MG (TWO PACKETS) ONTO THE SKIN DAILY. HIsaac Bliss ERayford Halsted MD Taking Active   VITAMIN A PO [id number] Take 1 tablet by mouth every other day.  [provider] Taking Active   vitamin E 100 UNIT capsule [id number] Take 100 Units by mouth  daily. [provider] Taking Active   Zinc 50 MG TABS [id number] Take 50 mg by mouth daily. [provider] Taking Active Self  Patient Active Problem List   Diagnosis Date Noted   Low testosterone 11/21/2017   S/P PTCA (percutaneous transluminal coronary angioplasty) 11/18/2015   Coronary artery disease due to lipid rich plaque 11/18/2015   Elevated LFTs 11/18/2015   Wellness examination 10/18/2015   Smoker 10/18/2015   HNP (herniated nucleus pulposus), lumbar 05/18/2014   Hypercalcemia 09/29/2013   Special screening for malignant neoplasms, colon 09/27/2013   NASH (nonalcoholic steatohepatitis) 08/28/2012   Screening for prostate cancer 08/28/2012   Hypogonadism male 08/28/2012   Testosterone deficiency in male 07/22/2011   Fatigue 07/05/2011   RBBB 10/12/2010   HEMORRHOIDS-INTERNAL 06/18/2010   ARTHRALGIA 01/18/2010   TOBACCO USER 07/11/2009   POSTURAL LIGHTHEADEDNESS 07/11/2009   DIVERTICULOSIS, COLON 11/18/2008   BACK PAIN, LUMBAR 09/01/2008   SWEATING 07/26/2008   Diabetes ([place]) 01/20/2008   COUGH 01/20/2008   Dyslipidemia 06/02/2007   Essential hypertension 06/02/2007   Coronary atherosclerosis 06/02/2007   GERD 06/02/2007    Immunization History  Administered Date(s) Administered   Fluad Quad(high Dose 65+) 08/10/2019, 07/24/2020   Influenza Split 10/25/2011, 08/28/2012   Influenza Whole 09/01/2008   Influenza, High Dose Seasonal PF 11/20/2018   Influenza,inj,Quad PF,6+ Mos 09/27/2013, 10/14/2014, 10/18/2015, 11/19/2016, 11/21/2017   PFIZER(Purple Top)SARS-COV-2 Vaccination 12/13/2019, 01/09/2020, 08/25/2020   Pneumococcal Conjugate-13 05/20/2017   Pneumococcal Polysaccharide-23 09/27/2013, 11/20/2018   Td 05/31/1998   Tdap 10/14/2014   Zoster Recombinat (Shingrix) 05/20/2017, 01/08/2019, 04/09/2019   Zoster, Live 10/25/2011   Rybelsus is making him tired; didn't happen with the lower dose Doesn't eat until 4-5 pm -    No low blood sugars - nothing < 70    Conditions to be addressed/monitored:  Hypertension, Hyperlipidemia, Diabetes, Coronary Artery Disease, GERD, Tobacco use and Back Pain, Hypogonadism  Care Plan : CCM Pharmacy Care Plan  Updates made by Viona Gilmore, [place] since 04/23/2021 12:00 AM     Problem: Problem: Hypertension, Hyperlipidemia, Diabetes, Coronary Artery Disease, GERD, Tobacco use and Back Pain, Hypogonadism      Long-Range Goal: Patient-Specific Goal   Start Date: 04/17/2021  Expected End Date: 04/17/2022  This Visit's Progress: On track  Priority: High  Note:   Current Barriers:  Unable to independently monitor therapeutic efficacy Unable to achieve control of diabetes   Pharmacist Clinical Goal(s):  Patient will achieve adherence to monitoring guidelines and medication adherence to achieve therapeutic efficacy through collaboration with PharmD and provider.   Interventions: 1:1 collaboration with Isaac Bliss, Rayford Halsted, MD regarding development and update of comprehensive plan of care as evidenced by provider attestation and co-signature Inter-disciplinary care team collaboration (see longitudinal plan of care) Comprehensive medication review performed; medication list updated in electronic medical record  Hypertension (BP goal <130/80) -Not ideally controlled -Current treatment: Atenolol (Tenormin) 39m, 1 tablet once daily (night)  Lisinopril 465m 1 tablet once daily (night) -Medications previously tried: n/a  -Current home readings: does not check at home -Current dietary habits: did not discuss -Current exercise habits: did not discuss -Denies hypotensive/hypertensive symptoms -Educated on Importance of home blood pressure monitoring; Proper BP monitoring technique; -Counseled to monitor BP at home weekly, document, and provide log at future appointments -Counseled on diet and exercise extensively Recommended to continue current  medication  Hyperlipidemia: (LDL goal < 70) -Controlled (LDL very low) -Current treatment: evolocumab (Repatha) 14076mml, inject 1 pen into skin every fourteen days icosapent ethyl (Vascepa) 1 g, take 2 capsules twice daily  Rosuvastatin 5mg70m tablet once daily at 6pm -Medications previously tried: atorvastatin, simvastatin (myalgia) -Current  dietary patterns: did not discuss -Current exercise habits: did not discuss -Educated on Cholesterol goals;  Benefits of statin for ASCVD risk reduction; Importance of limiting foods high in cholesterol; Exercise goal of 150 minutes per week; -Recommended repeat lipid panel as LDL is extremely low  Coronary atherosclerosis  (Goal: prevent heart events) -Controlled -Current treatment  Aspirin 7m,1 tablet once daily  evolocumab (Repatha) 1423m ml, inject 1 pen into skin every fourteen days Nitroglycerin 0.61m9mL, place 1 tablet under tongue every 5 minutes as needed for chest pain icosapent ethyl (Vascepa) 1g, take 2 capsule twice daily  Rosuvastatin 5mg85m tablet once daily at 6pm -Medications previously tried: none  -Recommended to continue current medication   Diabetes (A1c goal <6.5%) -Uncontrolled -Current medications: Rybelsus 14 mg 1 tablet daily Farxiga 10 mg daily Metformin XR 500 mg 2 tablets daily -Medications previously tried: n/a  -Current home glucose readings fasting glucose: 101 post prandial glucose: 164 -Denies hypoglycemic/hyperglycemic symptoms -Current meal patterns:  breakfast: did not discuss  lunch: did not discuss   dinner: did not discuss  snacks: did not discuss  drinks: did not discuss  -Current exercise: did not discuss  -Educated on A1c and blood sugar goals; Benefits of routine self-monitoring of blood sugar; Carbohydrate counting and/or plate method -Counseled to check feet daily and get yearly eye exams -Counseled on diet and exercise extensively Recommended to continue current  medication Patient reported fatigue since starting higher dose of Rybelsus.  Tobacco use (Goal quit smoking) -Uncontrolled -Previous quit attempts: none -Current treatment  No medications -Patient smokes Within 30 minutes of waking -Patient triggers include:  enjoyment -On a scale of 1-10, reports MOTIVATION to quit is 1 -On a scale of 1-10, reports CONFIDENCE in quitting is 1 - Counseled on advantages of smoking cessation specifically for CVD benefits (reduces risk of stroke by 70%); patient reports he has already received the quit line phone number and does not need it again - Patient reports he has cut down from 2 PPD to 1 PPD and truly enjoys it. He doesn't know if he will ever be ready to quit.   GERD (Goal: minimize symptoms) -Controlled -Current treatment  Pantoprazole 40 mg 1 tablet as needed (every week is different) -Medications previously tried: none  -Counseled on non-pharmacologic management of symptoms such as elevating the head of your bed, avoiding eating 2-3 hours before bed, avoiding triggering foods such as acidic, spicy, or fatty foods, eating smaller meals, and wearing clothes that are loose around the waist  Low testosterone (Goal: 350-750) -Uncontrolled -Current treatment  Testosterone 1% gel, apply 25mc33mery morning -Medications previously tried: none  -Recommended repeat testosterone level  Back pain (Goal: minimize pain) -Controlled -Current treatment  Tizanidine 2mg, 361mablet every eight hours as needed for muscle spasms -Medications previously tried: none  -Recommended to continue current medication   Health Maintenance -Vaccine gaps: COVID booster -Current therapy:  Vitamin C 1000mg, 89mmg on3maily Vitmain B complex, 1 tablet once daily Cholecalciferol (vitamin D) 2000 units, 1 tablet once daily Cyanocobalamin (vitamin B12), 1 tablet once daily  Folic acid, 1 tablet once daily Ginkgo biloba, 1 tablet once daily  Multivitamin, 1 tablet  once daily  Vitamin A, 1 tablet every other day  Vitamin E 100 units, 1 capsule once daily Zinc 50mg, 1 75met once daily  Biotin 500mg once58mly -Educated on Herbal supplement research is limited and benefits usually cannot be proven Cost vs benefit of each product must be carefully weighed by  individual consumer Supplements may interfere with prescription drugs -Patient is satisfied with current therapy and denies issues -Counseled on duplication of multi-vitamin and separate vitamins. Patient reports wanting to add additional dose to day intake of vitamins.  Patient Goals/Self-Care Activities Patient will:  - take medications as prescribed check glucose daily, document, and provide at future appointments check blood pressure weekly, document, and provide at future appointments target a minimum of 150 minutes of moderate intensity exercise weekly  Follow Up Plan: Telephone follow up appointment with care management team member scheduled for: 6 months       Medication Assistance: None required.  Patient affirms current coverage meets needs.  Compliance/Adherence/Medication fill history: Care Gaps: COVID booster  Star-Rating Drugs: Rosuvastatin 5 mg - last [facility]/11/21 [facility] Lisinopril 40 mg  - last filled 04/08/21 for 90 ds at [facility] Metformin 500 mg - last filled 03/22/21 for 90 ds at [facility]  Patient's preferred pharmacy is:  [id number] [place], [id number]: [phone]: [phone] Uses pill box? Yes Pt endorses 100% compliance  We discussed: Current pharmacy is preferred with insurance plan and patient is satisfied with pharmacy services Patient decided to: Continue current medication management strategy  Care Plan and Follow Up Patient Decision:  Patient agrees to Care Plan and Follow-up.  Plan: Telephone follow up appointment with care  management team member scheduled for:  6 months  MJeni Salles PharmD, BPittsburgPharmacist LBelpreat [phone]

## 2021-04-23 NOTE — Patient Instructions (Signed)
Hi [name] [name],  It was great speaking with you again! Below is a summary of some of the topics we discussed.   Please reach out to me if you have any questions or need anything before our follow up!  Best, Maddie  Jeni Salles, PharmD, [facility] Visit Information   Goals Addressed   None    Patient Care Plan: CCM Pharmacy Care Plan     Problem Identified: Problem: Hypertension, Hyperlipidemia, Diabetes, Coronary Artery Disease, GERD, Tobacco use and Back Pain, Hypogonadism      Long-Range Goal: Patient-Specific Goal   Start Date: 04/17/2021  Expected End Date: 04/17/2022  This Visit's Progress: On track  Priority: High  Note:   Current Barriers:  Unable to independently monitor therapeutic efficacy Unable to achieve control of diabetes   Pharmacist Clinical Goal(s):  Patient will achieve adherence to monitoring guidelines and medication adherence to achieve therapeutic efficacy through collaboration with PharmD and provider.   Interventions: 1:1 collaboration with Isaac Bliss, Rayford Halsted, MD regarding development and update of comprehensive plan of care as evidenced by provider attestation and co-signature Inter-disciplinary care team collaboration (see longitudinal plan of care) Comprehensive medication review performed; medication list updated in electronic medical record  Hypertension (BP goal <130/80) -Not ideally controlled -Current treatment: Atenolol (Tenormin) 20m, 1 tablet once daily (night)  Lisinopril 447m 1 tablet once daily (night) -Medications previously tried: n/a  -Current home readings: does not check at home -Current dietary habits: did not discuss -Current exercise habits: did not discuss -Denies hypotensive/hypertensive symptoms -Educated on Importance of home blood pressure monitoring; Proper BP monitoring technique; -Counseled to monitor BP at home weekly, document, and provide  log at future appointments -Counseled on diet and exercise extensively Recommended to continue current medication  Hyperlipidemia: (LDL goal < 70) -Controlled (LDL very low) -Current treatment: evolocumab (Repatha) 14023mml, inject 1 pen into skin every fourteen days icosapent ethyl (Vascepa) 1 g, take 2 capsules twice daily  Rosuvastatin 5mg68m tablet once daily at 6pm -Medications previously tried: atorvastatin, simvastatin (myalgia) -Current dietary patterns: did not discuss -Current exercise habits: did not discuss -Educated on Cholesterol goals;  Benefits of statin for ASCVD risk reduction; Importance of limiting foods high in cholesterol; Exercise goal of 150 minutes per week; -Recommended repeat lipid panel as LDL is extremely low  Coronary atherosclerosis  (Goal: prevent heart events) -Controlled -Current treatment  Aspirin 81mg40mablet once daily  evolocumab (Repatha) 140mg/44m inject 1 pen into skin every fourteen days Nitroglycerin 0.4mg SL50mlace 1 tablet under tongue every 5 minutes as needed for chest pain icosapent ethyl (Vascepa) 1g, take 2 capsule twice daily  Rosuvastatin 5mg, 1 44mlet once daily at 6pm -Medications previously tried: none  -Recommended to continue current medication   Diabetes (A1c goal <6.5%) -Uncontrolled -Current medications: Rybelsus 14 mg 1 tablet daily Farxiga 10 mg daily Metformin XR 500 mg 2 tablets daily -Medications previously tried: n/a  -Current home glucose readings fasting glucose: 101 post prandial glucose: 164 -Denies hypoglycemic/hyperglycemic symptoms -Current meal patterns:  breakfast: did not discuss  lunch: did not discuss   dinner: did not discuss  snacks: did not discuss  drinks: did not discuss  -Current exercise: did not discuss  -Educated on A1c and blood sugar goals; Benefits of routine self-monitoring of blood sugar; Carbohydrate counting and/or plate method -Counseled to check feet daily and get  yearly eye exams -Counseled on diet and exercise extensively Recommended to continue current medication Patient reported  fatigue since starting higher dose of Rybelsus.  Tobacco use (Goal quit smoking) -Uncontrolled -Previous quit attempts: none -Current treatment  No medications -Patient smokes Within 30 minutes of waking -Patient triggers include:  enjoyment -On a scale of 1-10, reports MOTIVATION to quit is 1 -On a scale of 1-10, reports CONFIDENCE in quitting is 1 - Counseled on advantages of smoking cessation specifically for CVD benefits (reduces risk of stroke by 70%); patient reports he has already received the quit line phone number and does not need it again - Patient reports he has cut down from 2 PPD to 1 PPD and truly enjoys it. He doesn't know if he will ever be ready to quit.   GERD (Goal: minimize symptoms) -Controlled -Current treatment  Pantoprazole 40 mg 1 tablet as needed (every week is different) -Medications previously tried: none  -Counseled on non-pharmacologic management of symptoms such as elevating the head of your bed, avoiding eating 2-3 hours before bed, avoiding triggering foods such as acidic, spicy, or fatty foods, eating smaller meals, and wearing clothes that are loose around the waist  Low testosterone (Goal: 350-750) -Uncontrolled -Current treatment  Testosterone 1% gel, apply 78mg every morning -Medications previously tried: none  -Recommended repeat testosterone level  Back pain (Goal: minimize pain) -Controlled -Current treatment  Tizanidine 231m 1 tablet every eight hours as needed for muscle spasms -Medications previously tried: none  -Recommended to continue current medication   Health Maintenance -Vaccine gaps: COVID booster -Current therapy:  Vitamin C 100073m2000m23mce daily Vitmain B complex, 1 tablet once daily Cholecalciferol (vitamin D) 2000 units, 1 tablet once daily Cyanocobalamin (vitamin B12), 1 tablet once daily   Folic acid, 1 tablet once daily Ginkgo biloba, 1 tablet once daily  Multivitamin, 1 tablet once daily  Vitamin A, 1 tablet every other day  Vitamin E 100 units, 1 capsule once daily Zinc 50mg5mtablet once daily  Biotin 500mg 10m daily -Educated on Herbal supplement research is limited and benefits usually cannot be proven Cost vs benefit of each product must be carefully weighed by individual consumer Supplements may interfere with prescription drugs -Patient is satisfied with current therapy and denies issues -Counseled on duplication of multi-vitamin and separate vitamins. Patient reports wanting to add additional dose to day intake of vitamins.  Patient Goals/Self-Care Activities Patient will:  - take medications as prescribed check glucose daily, document, and provide at future appointments check blood pressure weekly, document, and provide at future appointments target a minimum of 150 minutes of moderate intensity exercise weekly  Follow Up Plan: Telephone follow up appointment with care management team member scheduled for: 6 months       Patient verbalizes understanding of instructions provided today and agrees to view in MyCharStocktonephone follow up appointment with pharmacy team member scheduled for:6 months  MadeliViona GilmoreAsc[facility] Se [facility]

## 2021-05-22 ENCOUNTER — Other Ambulatory Visit: Payer: Self-pay | Admitting: Endocrinology

## 2021-06-04 ENCOUNTER — Other Ambulatory Visit: Payer: Self-pay | Admitting: Pharmacist

## 2021-06-04 MED ORDER — REPATHA SURECLICK 140 MG/ML ~~LOC~~ SOAJ: 1.0000 "pen " | SUBCUTANEOUS | 3 refills | Status: DC

## 2021-07-20 ENCOUNTER — Other Ambulatory Visit: Payer: Self-pay | Admitting: Internal Medicine

## 2021-07-20 DIAGNOSIS — E785 Hyperlipidemia, unspecified: Secondary | ICD-10-CM

## 2021-07-20 DIAGNOSIS — R7989 Other specified abnormal findings of blood chemistry: Secondary | ICD-10-CM

## 2021-07-25 ENCOUNTER — Other Ambulatory Visit: Payer: Self-pay | Admitting: Internal Medicine

## 2021-07-25 ENCOUNTER — Telehealth: Payer: Self-pay | Admitting: Internal Medicine

## 2021-07-25 DIAGNOSIS — R7989 Other specified abnormal findings of blood chemistry: Secondary | ICD-10-CM

## 2021-07-25 MED ORDER — TESTOSTERONE 25 MG/2.5GM (1%) TD GEL: 50.0000 mg | Freq: Every day | TRANSDERMAL | 3 refills | Status: DC

## 2021-07-25 NOTE — Telephone Encounter (Signed)
Patient called about prescription for Testosterone 25 MG/2.5GM (1%) GEL Patient states pharmacy is saying they have not heard anything from [facility] and cannot fill it.    [facility]  [id number] [place] [place]:  [phone] Fax:  [phone]    Please Advise

## 2021-07-25 NOTE — Telephone Encounter (Signed)
[facility]    Testosterone was sent for 2.5 g which is 1 package.  Patient gets 2 boxes which will be 150 g.  Please send in new Rx

## 2021-07-25 NOTE — Telephone Encounter (Signed)
Patient called back stating that the prescription was phoned in wrong.  Patient could be contacted at [phone].  Please advise.

## 2021-07-26 ENCOUNTER — Other Ambulatory Visit: Payer: Self-pay | Admitting: Internal Medicine

## 2021-07-26 DIAGNOSIS — H2513 Age-related nuclear cataract, bilateral: Secondary | ICD-10-CM | POA: Diagnosis not present

## 2021-07-26 MED ORDER — TESTOSTERONE 25 MG/2.5GM (1%) TD GEL: 50.0000 mg | Freq: Every day | TRANSDERMAL | 3 refills | Status: DC

## 2021-07-26 NOTE — Telephone Encounter (Signed)
Please send new Rx with the quality correction.

## 2021-07-26 NOTE — Telephone Encounter (Signed)
Rx sent 

## 2021-07-30 NOTE — Telephone Encounter (Signed)
PT called to advise that the pharmacy is unable to get their Testosterone 25 MG/2.5GM (1%) GEL. Due to the vendor they get it from no longer makes it. Please advise as to what can be done as the PT has been out of it for awhile now.

## 2021-07-31 ENCOUNTER — Telehealth: Payer: Self-pay | Admitting: Internal Medicine

## 2021-07-31 NOTE — Telephone Encounter (Signed)
Patient called because he needs a new prescription for testosterone in a topical form. Patient would like the new prescription to be for a 90 day supply. Patient states they will no longer be making a gel he is on. [facility] also [facility]    [facility]  [id number] [place] [place]:  [phone] Fax:  [phone]      Good callback number is [phone]  Please Advise

## 2021-08-02 NOTE — Telephone Encounter (Signed)
Fax received. Left detailed message on machine for patient to call his insurance company to see what is covered.  I also [facility]ed a note [facility] to [facility] with the same request.

## 2021-08-02 NOTE — Telephone Encounter (Signed)
PT called to relay info that his pharmacy was able to work it out and for future references his insurance covers everything.

## 2021-08-02 NOTE — Telephone Encounter (Signed)
Noted  

## 2021-08-07 ENCOUNTER — Telehealth: Payer: Self-pay | Admitting: Pharmacist

## 2021-08-07 NOTE — Chronic Care Management (AMB) (Signed)
    Chronic Care Management Pharmacy Assistant   Name: [name] [name]  MRN: [id number] DOB: Jan 05, 1951   Reason for Encounter: Reschedule appointment. New note created for assessment call, will reschedule appointment with that call.    Care Gaps:  Ophthalmology - overdue Flu vaccine - due  Star Rating Drugs:  Farxiga 5m - last filled [facility]01/[facility] Lisinopril 4105m- last filled [facility]15/20[facility] Metformin ER 50060m last filled [facility]02/2021 [facility] Rosuvastatin 5mg61mlast filled [facility]   [phone]

## 2021-08-07 NOTE — Chronic Care Management (AMB) (Signed)
Chronic Care Management Pharmacy Assistant   Name: [name] [name]  MRN: [id number] DOB: 03-Sep-1951   Reason for Encounter: Disease State / Hypertension and Diabetes Assessment Call   Conditions to be addressed/monitored: HTN and DMII  Recent office visits:  None  Recent consult visits:  None  Hospital visits:  None in previous 6 months  Medications: Outpatient Encounter Medications as of 08/07/2021  Medication Sig   Ascorbic Acid (VITAMIN C) 1000 MG tablet Take 2,000 mg by mouth daily.   aspirin EC 81 MG tablet Take 1 tablet (81 mg total) by mouth daily.   atenolol (TENORMIN) 25 MG tablet TAKE 1 TABLET BY MOUTH EVERY DAY   b complex vitamins tablet Take 1 tablet by mouth daily.   BIOTIN PO Take by mouth. 520mg once daily   Cholecalciferol (VITAMIN D) 2000 UNITS tablet Take 2,000 Units by mouth daily.   Coenzyme Q10 (CO Q 10) 100 MG CAPS Take 200 mg by mouth daily.    Cyanocobalamin (VITAMIN B-12 PO) Take 1 tablet by mouth daily.   dapagliflozin propanediol (FARXIGA) 10 MG TABS tablet Take 1 tablet (10 mg total) by mouth daily before breakfast.   Evolocumab (REPATHA SURECLICK) 1800MG/ML SOAJ Inject 1 pen into the skin every 14 (fourteen) days.   FOLIC ACID PO Take 1 tablet by mouth daily.   Ginkgo Biloba (GINKOBA PO) Take 1 tablet by mouth daily.   icosapent Ethyl (VASCEPA) 1 g capsule Take 2 capsules (2 g total) by mouth 2 (two) times daily.   lisinopril (ZESTRIL) 40 MG tablet TAKE 1 TABLET BY MOUTH EVERY DAY   Magnesium 400 MG CAPS Take 400 mg by mouth daily.    metFORMIN (GLUCOPHAGE-XR) 500 MG 24 hr tablet Take 2 tablets (1,000 mg total) by mouth daily.   Multiple Vitamin (MULTIVITAMIN) tablet Take 1 tablet by mouth daily.   nitroGLYCERIN (NITROSTAT) 0.4 MG SL tablet Place 1 tablet (0.4 mg total) under the tongue every 5 (five) minutes as needed for chest pain.   ONETOUCH VERIO test strip USE AS DIRECTED   pantoprazole (PROTONIX) 40 MG tablet Take 40 mg by mouth daily  as needed.   rosuvastatin (CRESTOR) 5 MG tablet TAKE 1 TABLET BY MOUTH EVERY DAY AT 6 PM   Semaglutide (RYBELSUS) 14 MG TABS Take 14 mg by mouth daily.   Testosterone 25 MG/2.5GM (1%) GEL Apply 50 mg topically daily.   VITAMIN A PO Take 1 tablet by mouth every other day.    vitamin E 100 UNIT capsule Take 100 Units by mouth daily.   Zinc 50 MG TABS Take 50 mg by mouth daily.   Facility-Administered Encounter Medications as of 08/07/2021  Medication   regadenoson (LEXISCAN) injection SOLN 0.4 mg   Fill History:  ATENOLOL 25 MG TABLET 04/17/2021 90   FARXIGA 10 MG TABLET 07/01/2021 90   REPATHA 1349MG/ML SURECLICK 017/91/505684   ICOSAPENT ETHYL 1 GRAM CAPSULE 06/05/2021 90   LISINOPRIL 40 MG TABLET 06/15/2021 90   PANTOPRAZOLE SOD DR 40 MG TAB 05/13/2021 90   ROSUVASTATIN CALCIUM 5 MG TAB 07/20/2021 90   RYBELSUS 14 MG TABLET 06/16/2021 90   TESTOSTERONE 1% (25MG/2.5G) PK 07/31/2021 30   METFORMIN HCL ER 500 MG TABLET 06/21/2021 90   Reviewed chart prior to disease state call. Spoke with patient regarding BP  Recent Office Vitals: BP Readings from Last 3 Encounters:  03/22/21 (!) 144/84  03/20/21 134/74  01/09/21 120/78   Pulse Readings from Last 3  Encounters:  03/22/21 77  03/20/21 70  01/09/21 76    Wt Readings from Last 3 Encounters:  03/22/21 202 lb 9.6 oz (91.9 kg)  03/20/21 202 lb 3.2 oz (91.7 kg)  01/09/21 200 lb 3.2 oz (90.8 kg)     Kidney Function Lab Results  Component Value Date/Time   CREATININE 0.86 01/09/2021 02:15 PM   CREATININE 0.72 (L) 09/11/2020 02:56 PM   CREATININE 0.86 09/03/2016 01:48 PM   CREATININE 0.81 05/29/2016 01:58 PM   GFR 88.81 01/09/2021 02:15 PM   GFRNONAA 96 09/11/2020 02:56 PM   GFRAA 111 09/11/2020 02:56 PM    BMP Latest Ref Rng & Units 01/09/2021 09/11/2020 12/23/2019  Glucose 70 - 99 mg/dL 106(H) 118(H) 130(H)  BUN 6 - 23 mg/dL 19 16 13   Creatinine 0.40 - 1.50 mg/dL 0.86 0.72(L) 0.82  BUN/Creat Ratio 10 - 24 - 22 -   Sodium 135 - 145 mEq/L 142 145(H) 141  Potassium 3.5 - 5.1 mEq/L 4.7 4.7 4.3  Chloride 96 - 112 mEq/L 107 107(H) 105  CO2 19 - 32 mEq/L 29 22 27   Calcium 8.4 - 10.5 mg/dL 9.9 9.6 10.3    Current antihypertensive regimen:  Atenolol 25 mg take 1 tablet daily Lisinopril 40 mg take 1 dablet daily  How often are you checking your Blood Pressure?  Patient is not checking his blood pressures.  Current home BP readings: Not checking  What recent interventions/DTPs have been made by any provider to improve Blood Pressure control since last CPP Visit: None  Any recent hospitalizations or ED visits since last visit with CPP? No  What diet changes have been made to improve Blood Pressure Control?  No diet changes however, patient rarely eats breads and pasta's. He does mention that he likes sweets but tries to limit them. His diet is primarily meats, mostly beef and vegetables.  What exercise is being done to improve your Blood Pressure Control?  Patient walks 3-4 times per week.  Adherence Review: Is the patient currently on ACE/ARB medication? Yes Does the patient have >5 day gap between last estimated fill dates? No   Recent Relevant Labs: Lab Results  Component Value Date/Time   HGBA1C 6.8 (A) 03/22/2021 02:18 PM   HGBA1C 7.1 (H) 01/09/2021 02:15 PM   HGBA1C 6.2 (H) 09/11/2020 02:56 PM   HGBA1C 6.3 02/08/2019 02:12 PM   MICROALBUR 0.9 11/19/2016 04:01 PM   MICROALBUR 2.4 (H) 10/18/2015 02:19 PM    Kidney Function Lab Results  Component Value Date/Time   CREATININE 0.86 01/09/2021 02:15 PM   CREATININE 0.72 (L) 09/11/2020 02:56 PM   CREATININE 0.86 09/03/2016 01:48 PM   CREATININE 0.81 05/29/2016 01:58 PM   GFR 88.81 01/09/2021 02:15 PM   GFRNONAA 96 09/11/2020 02:56 PM   GFRAA 111 09/11/2020 02:56 PM    Current antihyperglycemic regimen:  Farxiga 10 mg take 1 tablet by mouth daily. Metformin ER 500 mg take 2 tablets by mouth daily. Semaglutide 14 mg take 1 tablet by  mouth daily.  What recent interventions/DTPs have been made to improve glycemic control:  None  Have there been any recent hospitalizations or ED visits since last visit with CPP? No  Patient denies hypoglycemic symptoms, including None  Patient denies hyperglycemic symptoms, including none  How often are you checking your blood sugar? 1-2 times per week.  What are your blood sugars ranging?  Patient is checking his blood sugars 1-2 times per week, some fasting and some non fasting, he states  his blood sugars are running between 105-150.  During the week, how often does your blood glucose drop below 70? Never  Are you checking your feet daily/regularly? Yes  Adherence Review: Is the patient currently on a STATIN medication? Yes Is the patient currently on ACE/ARB medication? Yes Does the patient have >5 day gap between last estimated fill dates? No  Notes: Patients appointment rescheduled to Jan 2023  Care Gaps:  Ophthalmology - overdue Flu vaccine - due  Star Rating Drugs:  Farxiga 27m - [facility] Lisinopril 437m- last filled [facility] Metformin ER 50067m last filled [facility]/02/20[facility] Rosuvastatin 5mg65mlast filled [facility]   [phone]

## 2021-08-20 ENCOUNTER — Other Ambulatory Visit: Payer: Self-pay | Admitting: *Deleted

## 2021-08-20 MED ORDER — ATENOLOL 25 MG PO TABS: 25.0000 mg | ORAL_TABLET | Freq: Every day | ORAL | 1 refills | Status: DC

## 2021-09-08 ENCOUNTER — Other Ambulatory Visit: Payer: Self-pay | Admitting: Internal Medicine

## 2021-09-18 ENCOUNTER — Ambulatory Visit (INDEPENDENT_AMBULATORY_CARE_PROVIDER_SITE_OTHER): Payer: Medicare Other | Admitting: Physician Assistant

## 2021-09-18 ENCOUNTER — Encounter (HOSPITAL_BASED_OUTPATIENT_CLINIC_OR_DEPARTMENT_OTHER): Payer: Self-pay | Admitting: Physician Assistant

## 2021-09-18 ENCOUNTER — Other Ambulatory Visit: Payer: Self-pay

## 2021-09-18 VITALS — BP 122/60 | HR 73 | Ht 67.0 in | Wt 199.1 lb

## 2021-09-18 DIAGNOSIS — I2510 Atherosclerotic heart disease of native coronary artery without angina pectoris: Secondary | ICD-10-CM | POA: Diagnosis not present

## 2021-09-18 DIAGNOSIS — E785 Hyperlipidemia, unspecified: Secondary | ICD-10-CM | POA: Diagnosis not present

## 2021-09-18 DIAGNOSIS — F17200 Nicotine dependence, unspecified, uncomplicated: Secondary | ICD-10-CM

## 2021-09-18 DIAGNOSIS — I10 Essential (primary) hypertension: Secondary | ICD-10-CM | POA: Diagnosis not present

## 2021-09-18 DIAGNOSIS — E119 Type 2 diabetes mellitus without complications: Secondary | ICD-10-CM

## 2021-09-18 NOTE — Progress Notes (Signed)
Office Visit    Patient Name: [name] [name] Date of Encounter: 09/18/2021  PCP:  [name] [name], [name] Halsted, [name]   [name] [name]  Cardiologist:  [name] Bergeron, [name]  Advanced Practice Provider:  No care team member to display Electrophysiologist:  None    Chief Complaint    [name] [name] is a 70 y.o. male with a hx of CAD status post PCI following a positive stress test in 2006, HTN, [place] LD, DM type II, RBBB, and tobacco use who was seen by [name] [name] in May 2022.  He was previously seen by [name] [name].  Presents today for a routine 37-monthfollow-up appointment.   Past Medical History    Past Medical History:  Diagnosis Date   Anemia, iron deficiency 11/18/2008   Resolved, despite no rx--2012     ANEMIA-IRON DEFICIENCY    Arthritis    BACK PAIN, LUMBAR    Blood transfusion without reported diagnosis    CORONARY ARTERY DISEASE    DIVERTICULOSIS, COLON    DM    on no medication ?prediabetes   Elevated LFTs 11/18/2015   GERD    Hepatitis    States he was tested for Hep B and he had antibodies   HYPERLIPIDEMIA    HYPERTENSION    [name] [name]   Nephrolithiasis    PONV (postoperative nausea and vomiting)    little nausea after having his hip replacement   Unspecified disorder of urethra and urinary tract    Past Surgical History:  Procedure Laterality Date   COLONOSCOPY     CORONARY ANGIOPLASTY WITH STENT PLACEMENT     06    [name] [name] [name]   ELECTROCARDIOGRAM  03/30/2007   ESOPHAGOGASTRODUODENOSCOPY  11/07/2005   kidney stones     in office procedure to remove but no sedation surgeries for kidney stones    LUMBAR LAMINECTOMY Right 05/18/2014   Procedure: LUMBAR LAMINECTOMY Microdiscectomy Right Lumbar five - sacral one;  Surgeon: [name] [name];  Location[place] [facility]  Service: Orthopedics;  Laterality: Right;   Stress Cardiolite  11/30/2004   TOTAL HIP ARTHROPLASTY  10/28/2012   Procedure: TOTAL HIP ARTHROPLASTY;  Surgeon: [name] [name];   Location: [facility]  Service: Orthopedics;  Laterality: Right;   UPPER GASTROINTESTINAL ENDOSCOPY      Allergies  Allergies  Allergen Reactions   Actos [Pioglitazone Hydrochloride]     Weight gain    History of Present Illness    [name] a 70y.o. male with a hx of CAD status post PCI following a positive stress test in 2006, HTN, HLD, diabetes mellitus type 2, and tobacco use who was last seen in May 2022 by [name] [name]  During that visit he was overall feeling well.  No chest pain, shortness of breath, lightheadedness, orthopnea or PND.  No lower extremity edema and has been compliant with all his medications.  Blood pressure was well controlled at this time but he continued to smoke, is not interested in cessation.  Today, he is reports no shortness of breath nor dyspnea on exertion. Reports no chest pain, pressure, or tightness. No edema, orthopnea, PND. Reports no palpitations.  Discussed smoking cessation and encouraged to cut back if he is not willing to quit.     EKGs/Labs/Other Studies Reviewed:   The following studies were reviewed today:  Stress Test 08/2019  Inconclusive ECG with significant motion artifact. Diffuse upsloping ST depressions, appear to become  horizontal in V3 and V4 leads. Nuclear stress EF: 60%. The left ventricular ejection fraction is normal (55-65%). Defect 1: There is a medium fixed defect of mild severity present in the basal inferior, mid inferior and apical inferior location. Given normal [name] motion in this region, consistent with artifact The study is normal. This is a low risk study.    EKG:  EKG is ordered today.  The ekg ordered today demonstrates chronic, stable RBBB. NSR.   Recent Labs: 01/09/2021: ALT 28; BUN 19; Creatinine, Ser 0.86; Hemoglobin 13.1; Platelets 265.0; Potassium 4.7; Sodium 142; TSH 1.71   Recent Lipid Panel    Component Value Date/Time   CHOL 88 01/09/2021 1415   CHOL 86 (L) 09/11/2020 1456   TRIG 134.0  01/09/2021 1415   HDL 59.60 01/09/2021 1415   HDL 57 09/11/2020 1456   CHOLHDL 1 01/09/2021 1415   VLDL 26.8 01/09/2021 1415   LDLCALC 1 01/09/2021 1415   LDLCALC 11 09/11/2020 1456   LDLDIRECT 118 (H) 08/23/2019 1552   LDLDIRECT 125.0 12/16/2018 1246     Home Medications   Current Meds  Medication Sig   Ascorbic Acid (VITAMIN C) 1000 MG tablet Take 2,000 mg by mouth daily.   aspirin EC 81 MG tablet Take 1 tablet (81 mg total) by mouth daily.   atenolol (TENORMIN) 25 MG tablet Take 1 tablet (25 mg total) by mouth daily.   b complex vitamins tablet Take 1 tablet by mouth daily.   BIOTIN PO Take by mouth. 589mg once daily   Cholecalciferol (VITAMIN D) 2000 UNITS tablet Take 2,000 Units by mouth daily.   Coenzyme Q10 (CO Q 10) 100 MG CAPS Take 200 mg by mouth daily.    Cyanocobalamin (VITAMIN B-12 PO) Take 1 tablet by mouth daily.   dapagliflozin propanediol (FARXIGA) 10 MG TABS tablet Take 1 tablet (10 mg total) by mouth daily before breakfast.   Evolocumab (REPATHA SURECLICK) 1378MG/ML SOAJ Inject 1 pen into the skin every 14 (fourteen) days.   FOLIC ACID PO Take 1 tablet by mouth daily.   Ginkgo Biloba (GINKOBA PO) Take 1 tablet by mouth daily.   icosapent Ethyl (VASCEPA) 1 g capsule Take 2 capsules (2 g total) by mouth 2 (two) times daily.   lisinopril (ZESTRIL) 40 MG tablet TAKE 1 TABLET BY MOUTH EVERY DAY   Magnesium 400 MG CAPS Take 400 mg by mouth daily.    metFORMIN (GLUCOPHAGE-XR) 500 MG 24 hr tablet Take 2 tablets (1,000 mg total) by mouth daily.   Multiple Vitamin (MULTIVITAMIN) tablet Take 1 tablet by mouth daily.   nitroGLYCERIN (NITROSTAT) 0.4 MG SL tablet Place 1 tablet (0.4 mg total) under the tongue every 5 (five) minutes as needed for chest pain.   ONETOUCH VERIO test strip USE AS DIRECTED   pantoprazole (PROTONIX) 40 MG tablet TAKE 1 TABLET BY MOUTH EVERY DAY   rosuvastatin (CRESTOR) 5 MG tablet TAKE 1 TABLET BY MOUTH EVERY DAY AT 6 PM   Semaglutide (RYBELSUS)  14 MG TABS Take 14 mg by mouth daily.   Testosterone 25 MG/2.5GM (1%) GEL Apply 50 mg topically daily.   VITAMIN A PO Take 1 tablet by mouth every other day.    vitamin E 100 UNIT capsule Take 100 Units by mouth daily.   Zinc 50 MG TABS Take 50 mg by mouth daily.     Physical Exam    VS:  BP 122/60 (BP Location: Left Arm, Patient Position: Sitting, Cuff Size: Normal)  Pulse 73   Ht 5' 7"  (1.702 m)   Wt 199 lb 1.6 oz (90.3 kg)   BMI 31.18 kg/m  , BMI Body mass index is 31.18 kg/m.  Wt Readings from Last 3 Encounters:  09/18/21 199 lb 1.6 oz (90.3 kg)  03/22/21 202 lb 9.6 oz (91.9 kg)  03/20/21 202 lb 3.2 oz (91.7 kg)     GEN: Well nourished, well developed, in no acute distress. HEENT: normal. Neck: Supple, no JVD, + carotid bruits (L), or masses. Cardiac: RRR, no murmurs, rubs, or gallops. No clubbing, cyanosis, edema.  Radials/PT 2+ and equal bilaterally.  Respiratory:  Respirations regular and unlabored, clear to auscultation bilaterally. GI: Soft, nontender, nondistended. MS: No deformity or atrophy. Skin: Warm and dry, no rash. Neuro:  Strength and sensation are intact. Psych: Normal affect.  Assessment & Plan    CAD status post remote PCI in 2006-Stress test back in 2020 was normal. Preserved EF of 55-65%. Plavix stopped in 2015. Only on ASA at this point. Continue GDMT: asa, statin, BB, and ACEI. Stable with no anginal symptoms. No indication for ischemic evaluation.    Chronic RBBB-stable on EKG today. Continue current medications.   Hypertension-well controlled. Continue atenolol and lisinopril.   Hyperlipidemia- LDL 1, Continue Repatha and Crestor. No concerns with LDL this low.   Diabetes mellitus type 2-followed by Endocrinology. A1C 6.6. Continue to monitor blood sugar regularly  Tobacco use-cessation advised. He did state he has been somewhat cutting back.   Left Carotid Bruit- bilateral carotid US ordered today for potential carotid stenosis.     Disposition: Follow up in 6 month(s) with [name] [name] or APP. Labs with PCP in March. Heart healthy diet and regular cardiovascular exercise encouraged.     Signed, Elgie Collard, PA-C 09/18/2021, 4:58 PM [facility]

## 2021-09-18 NOTE — Patient Instructions (Addendum)
Medication Instructions:  Your Physician recommend you continue on your current medication as directed.    *If you need a refill on your cardiac medications before your next appointment, please call your pharmacy*   Lab Work: None today  Testing/Procedures: Bilateral Carotid Duplex    Follow-Up: At [facility], you and your health needs are our priority.  As part of our continuing mission to provide you with exceptional heart care, we have created designated Provider Care Teams.  These Care Teams include your primary Cardiologist (physician) and Advanced Practice Providers (APPs -  Physician Assistants and Nurse Practitioners) who all work together to provide you with the care you need, when you need it.  We recommend signing up for the patient portal called "MyChart".  Sign up information is provided on this After Visit Summary.  MyChart is used to connect with patients for Virtual Visits (Telemedicine).  Patients are able to view lab/test results, encounter notes, upcoming appointments, etc.  Non-urgent messages can be sent to your provider as well.   To learn more about what you can do with MyChart, go to [email or web address].    Your next appointment:   6 month(s)  The format for your next appointment:   In Person  Provider:   Gwyndolyn Kaufman, MD    Other Instructions None

## 2021-09-20 DIAGNOSIS — H25043 Posterior subcapsular polar age-related cataract, bilateral: Secondary | ICD-10-CM | POA: Diagnosis not present

## 2021-09-20 DIAGNOSIS — H2511 Age-related nuclear cataract, right eye: Secondary | ICD-10-CM | POA: Diagnosis not present

## 2021-09-20 DIAGNOSIS — H2513 Age-related nuclear cataract, bilateral: Secondary | ICD-10-CM | POA: Diagnosis not present

## 2021-09-20 DIAGNOSIS — H18413 Arcus senilis, bilateral: Secondary | ICD-10-CM | POA: Diagnosis not present

## 2021-09-20 DIAGNOSIS — H25013 Cortical age-related cataract, bilateral: Secondary | ICD-10-CM | POA: Diagnosis not present

## 2021-09-21 ENCOUNTER — Telehealth: Payer: Self-pay | Admitting: *Deleted

## 2021-09-21 ENCOUNTER — Ambulatory Visit (HOSPITAL_COMMUNITY)
Admission: RE | Admit: 2021-09-21 | Discharge: 2021-09-21 | Disposition: A | Discharge status: Home / Self Care | Payer: Medicare Other | Source: Ambulatory Visit | Attending: Cardiology | Admitting: Cardiology

## 2021-09-21 ENCOUNTER — Other Ambulatory Visit: Payer: Self-pay

## 2021-09-21 DIAGNOSIS — I2510 Atherosclerotic heart disease of native coronary artery without angina pectoris: Secondary | ICD-10-CM | POA: Diagnosis not present

## 2021-09-21 DIAGNOSIS — R0989 Other specified symptoms and signs involving the circulatory and respiratory systems: Secondary | ICD-10-CM

## 2021-09-21 NOTE — Telephone Encounter (Signed)
   Patient Name: [name] [name]  DOB: 1951/07/09 MRN: [id number]  Primary Cardiologist: Freada Bergeron, MD  Chart reviewed as part of pre-operative protocol coverage. Cataract extractions are recognized in guidelines as low risk surgeries that do not typically require specific preoperative testing or holding of blood thinner therapy. Therefore, given past medical history and time since last visit, based on ACC/AHA guidelines, PROSPER PAFF would be at acceptable risk for the planned procedure without further cardiovascular testing.   I will route this recommendation to the requesting party via Epic fax function and remove from pre-op pool.  Please call with questions.  [place], [place] 09/21/2021, 1:47 PM

## 2021-09-21 NOTE — Telephone Encounter (Signed)
   Pre-operative Risk Assessment    Patient Name: [name] [name]  DOB: 1951/07/07 MRN: [id number]      Request for Surgical Clearance   Procedure:   RIGHT EYE CATARACT EXTRACTION W/INTRAOCULAR LENS IMPLANT TO BE DONE 12/05/21; FOLLOWED BY THE LEFT EYE TO BE DONE 12/19/21  Date of Surgery: Clearance 12/05/21 FOR RIGHT EYE; FOLLOWED BY THE LEFT EYE TO BE DONE 12/19/21 FOR EXACT PROCEDURE                            Surgeon:  DR. Christia Reading BEVIS/DR. LISA SUN Surgeon's Group or Practice Name:  [facility] Phone number:  [phone] Fax number:  [phone]   Type of Clearance Requested: - Medical ; PRE REQUEST FORM: NO MEDICATION INCLUDING ANY BLOOD THINNERS WILL NEED TO BE HELD   Type of Anesthesia:   TOPICAL WITH IV MEDICATION   Additional requests/questions:   Jiles Prows   09/21/2021, 10:11 AM

## 2021-09-24 ENCOUNTER — Ambulatory Visit: Payer: Medicare Other | Admitting: Endocrinology

## 2021-09-24 ENCOUNTER — Encounter (HOSPITAL_BASED_OUTPATIENT_CLINIC_OR_DEPARTMENT_OTHER): Payer: Self-pay

## 2021-09-24 NOTE — Progress Notes (Signed)
Results shared via mychart message

## 2021-09-26 ENCOUNTER — Ambulatory Visit (INDEPENDENT_AMBULATORY_CARE_PROVIDER_SITE_OTHER): Payer: Medicare Other | Admitting: Endocrinology

## 2021-09-26 ENCOUNTER — Other Ambulatory Visit: Payer: Self-pay

## 2021-09-26 VITALS — BP 124/70 | HR 92 | Ht 67.0 in | Wt 197.2 lb

## 2021-09-26 DIAGNOSIS — E119 Type 2 diabetes mellitus without complications: Secondary | ICD-10-CM | POA: Diagnosis not present

## 2021-09-26 LAB — POCT GLYCOSYLATED HEMOGLOBIN (HGB A1C): Hemoglobin A1C: 6.8 % — AB (ref 4.0–5.6)

## 2021-09-26 NOTE — Patient Instructions (Signed)
Please continue the same 3 diabetes medications.   check your blood sugar once a day.  vary the time of day when you check, between before the 3 meals, and at bedtime.  also check if you have symptoms of your blood sugar being too high or too low.  please keep a record of the readings and bring it to your next appointment here (or you can bring the meter itself).  You can write it on any piece of paper.  please call us sooner if your blood sugar goes below 70, or if you have a lot of readings over 200.   Please come back for a follow-up appointment in 6 months.

## 2021-09-26 NOTE — Progress Notes (Signed)
Subjective:    Patient ID: [name] [name], male    DOB: [birth year], 70 y.o.   MRN: [id number]  HPI Pt returns for f/u of DM. DM type: 2 Dx'ed: 6644 Complications: CAD Therapy: 3 oral meds.   DKA: never Severe hypoglycemia: never.  Pancreatitis: never Other: he has never been on insulin; pioglitizone was favored due to NASH, but he did not tolerate (weight gain).   Interval history: he does not check cbg's.  He takes meds as rx'ed.  pt states he feels well in general. Past Medical History:  Diagnosis Date   Anemia, iron deficiency 11/18/2008   Resolved, despite no rx--2012     ANEMIA-IRON DEFICIENCY    Arthritis    BACK PAIN, LUMBAR    Blood transfusion without reported diagnosis    CORONARY ARTERY DISEASE    DIVERTICULOSIS, COLON    DM    on no medication ?prediabetes   Elevated LFTs 11/18/2015   GERD    Hepatitis    States he was tested for Hep B and he had antibodies   HYPERLIPIDEMIA    HYPERTENSION    dr tom wall   Nephrolithiasis    PONV (postoperative nausea and vomiting)    little nausea after having his hip replacement   Unspecified disorder of urethra and urinary tract     Past Surgical History:  Procedure Laterality Date   COLONOSCOPY     CORONARY ANGIOPLASTY WITH STENT PLACEMENT     06    dr t. wall   ELECTROCARDIOGRAM  03/30/2007   ESOPHAGOGASTRODUODENOSCOPY  11/07/2005   kidney stones     in office procedure to remove but no sedation surgeries for kidney stones    LUMBAR LAMINECTOMY Right 05/18/2014   Procedure: LUMBAR LAMINECTOMY Microdiscectomy Right Lumbar five - sacral one;  Surgeon: [name], MD;  Location: [place];  Service: Orthopedics;  Laterality: Right;   Stress Cardiolite  11/30/2004   TOTAL HIP ARTHROPLASTY  10/28/2012   Procedure: TOTAL HIP ARTHROPLASTY;  Surgeon: [name], MD;  Location: [place];  Service: Orthopedics;  Laterality: Right;   UPPER GASTROINTESTINAL ENDOSCOPY      Social History   Socioeconomic History   Marital  status: Single    Spouse name: Not on file   Number of children: 0   Years of education: Not on file   Highest education level: Not on file  Occupational History   Occupation: Gaffer: [employer]    Comment: works Scientist, research (medical)   Occupation: Retired  Tobacco Use   Smoking status: Every Day    Packs/day: 1.00    Years: 48.00    Pack years: 48.00    Types: Cigarettes   Smokeless tobacco: Never   Tobacco comments:    states he wants to reduce his dependence.  Vaping Use   Vaping Use: Never used  Substance and Sexual Activity   Alcohol use: Yes    Alcohol/week: 0.0 standard drinks    Comment: rare   Drug use: No   Sexual activity: Not Currently  Other Topics Concern   Not on file  Social History Narrative   Originally from [place], [place]   Lives alone with dog, has a couple of friends he can rely on for support.    Social Determinants of Health   Financial Resource Strain: Low Risk    Difficulty of Paying Living Expenses: Not hard at all  Food Insecurity: Not on file  Transportation  Needs: No Transportation Needs   Lack of Transportation (Medical): No   Lack of Transportation (Non-Medical): No  Physical Activity: Not on file  Stress: Not on file  Social Connections: Not on file  Intimate Partner Violence: Not on file    Current Outpatient Medications on File Prior to Visit  Medication Sig Dispense Refill   Ascorbic Acid (VITAMIN C) 1000 MG tablet Take 2,000 mg by mouth daily.     aspirin EC 81 MG tablet Take 1 tablet (81 mg total) by mouth daily. 30 tablet 11   atenolol (TENORMIN) 25 MG tablet Take 1 tablet (25 mg total) by mouth daily. 90 tablet 1   b complex vitamins tablet Take 1 tablet by mouth daily.     BIOTIN PO Take by mouth. 514mg once daily     Cholecalciferol (VITAMIN D) 2000 UNITS tablet Take 2,000 Units by mouth daily.     Coenzyme Q10 (CO Q 10) 100 MG CAPS Take 200 mg by mouth daily.      Cyanocobalamin (VITAMIN B-12 PO) Take 1  tablet by mouth daily.     Evolocumab (REPATHA SURECLICK) 1263MG/ML SOAJ Inject 1 pen into the skin every 14 (fourteen) days. 6 mL 3   FOLIC ACID PO Take 1 tablet by mouth daily.     Ginkgo Biloba (GINKOBA PO) Take 1 tablet by mouth daily.     icosapent Ethyl (VASCEPA) 1 g capsule Take 2 capsules (2 g total) by mouth 2 (two) times daily. 360 capsule 2   lisinopril (ZESTRIL) 40 MG tablet TAKE 1 TABLET BY MOUTH EVERY DAY 90 tablet 3   Magnesium 400 MG CAPS Take 400 mg by mouth daily.      metFORMIN (GLUCOPHAGE-XR) 500 MG 24 hr tablet Take 2 tablets (1,000 mg total) by mouth daily. 180 tablet 3   Multiple Vitamin (MULTIVITAMIN) tablet Take 1 tablet by mouth daily.     nitroGLYCERIN (NITROSTAT) 0.4 MG SL tablet Place 1 tablet (0.4 mg total) under the tongue every 5 (five) minutes as needed for chest pain. 30 tablet 11   ONETOUCH VERIO test strip USE AS DIRECTED 100 strip 0   pantoprazole (PROTONIX) 40 MG tablet TAKE 1 TABLET BY MOUTH EVERY DAY 90 tablet 3   rosuvastatin (CRESTOR) 5 MG tablet TAKE 1 TABLET BY MOUTH EVERY DAY AT 6 PM 90 tablet 1   Semaglutide (RYBELSUS) 14 MG TABS Take 14 mg by mouth daily. 90 tablet 3   Testosterone 25 MG/2.5GM (1%) GEL Apply 50 mg topically daily. 150 g 3   VITAMIN A PO Take 1 tablet by mouth every other day.      vitamin E 100 UNIT capsule Take 100 Units by mouth daily.     Zinc 50 MG TABS Take 50 mg by mouth daily.     Current Facility-Administered Medications on File Prior to Visit  Medication Dose Route Frequency Provider Last Rate Last Admin   regadenoson (LEXISCAN) injection SOLN 0.4 mg  0.4 mg Intravenous Once SDonato Heinz MD        Allergies  Allergen Reactions   Actos [Pioglitazone Hydrochloride]     Weight gain    Family History  Problem Relation Age of Onset   Colon cancer Mother        Colon Cancer-at advanced age- in her 89'sper pt    Heart disease Father    Breast cancer Sister    Esophageal cancer Neg Hx    Rectal cancer  Neg Hx  Stomach cancer Neg Hx     BP 124/70 (BP Location: Right Arm, Patient Position: Sitting, Cuff Size: Normal)   Pulse 92   Ht 5' 7"  (1.702 m)   Wt 197 lb 3.2 oz (89.4 kg)   SpO2 97%   BMI 30.89 kg/m    Review of Systems He attributes intermitt diarrhea to Rybelsus.      Objective:   Physical Exam    Lab Results  Component Value Date   HGBA1C 6.8 (A) 09/26/2021      Assessment & Plan:  Type 2 DM: well-controlled Diarrhea, due to Rybelsus.  We discussed.  Pt would like to continue  Patient Instructions  Please continue the same 3 diabetes medications.   check your blood sugar once a day.  vary the time of day when you check, between before the 3 meals, and at bedtime.  also check if you have symptoms of your blood sugar being too high or too low.  please keep a record of the readings and bring it to your next appointment here (or you can bring the meter itself).  You can write it on any piece of paper.  please call us sooner if your blood sugar goes below 70, or if you have a lot of readings over 200.   Please come back for a follow-up appointment in 6 months.

## 2021-09-27 ENCOUNTER — Other Ambulatory Visit: Payer: Self-pay | Admitting: Endocrinology

## 2021-10-15 ENCOUNTER — Other Ambulatory Visit: Payer: Self-pay

## 2021-10-15 ENCOUNTER — Encounter (HOSPITAL_BASED_OUTPATIENT_CLINIC_OR_DEPARTMENT_OTHER): Payer: Self-pay

## 2021-10-15 DIAGNOSIS — I10 Essential (primary) hypertension: Secondary | ICD-10-CM

## 2021-10-15 DIAGNOSIS — E785 Hyperlipidemia, unspecified: Secondary | ICD-10-CM

## 2021-10-15 MED ORDER — LISINOPRIL 40 MG PO TABS: 40.0000 mg | ORAL_TABLET | Freq: Every day | ORAL | 3 refills | Status: DC

## 2021-10-18 ENCOUNTER — Telehealth: Payer: Medicare Other

## 2021-10-25 ENCOUNTER — Ambulatory Visit: Payer: Self-pay

## 2021-10-25 ENCOUNTER — Encounter: Payer: Self-pay | Admitting: Surgery

## 2021-10-25 ENCOUNTER — Ambulatory Visit (INDEPENDENT_AMBULATORY_CARE_PROVIDER_SITE_OTHER): Payer: Medicare Other | Admitting: Surgery

## 2021-10-25 ENCOUNTER — Other Ambulatory Visit: Payer: Self-pay

## 2021-10-25 VITALS — BP 138/82 | HR 74 | Ht 67.0 in | Wt 197.2 lb

## 2021-10-25 DIAGNOSIS — M545 Low back pain, unspecified: Secondary | ICD-10-CM | POA: Diagnosis not present

## 2021-10-25 DIAGNOSIS — M4726 Other spondylosis with radiculopathy, lumbar region: Secondary | ICD-10-CM | POA: Diagnosis not present

## 2021-10-25 MED ORDER — KETOROLAC TROMETHAMINE 30 MG/ML IJ SOLN: 30.0000 mg | Freq: Once | INTRAMUSCULAR | Status: AC

## 2021-10-25 MED ORDER — METHOCARBAMOL 500 MG PO TABS: 500.0000 mg | ORAL_TABLET | Freq: Three times a day (TID) | ORAL | 0 refills | Status: AC | PRN

## 2021-10-25 NOTE — Progress Notes (Signed)
Per Benjiman Core, PA-C, provided patient with an IM injection of Toradol, 18m in the right glute.  Patient tolerated injection very well.  Advised to call the office with any questions or concerns.

## 2021-10-25 NOTE — Progress Notes (Signed)
Office Visit Note   Patient: [name] [name]           Date of Birth: Mar 25, 1951           MRN: [id number] Visit Date: 10/25/2021              Requested by: Isaac Bliss, Rayford Halsted, MD [place],  [place] [address] PCP: Isaac Bliss, Rayford Halsted, MD   Assessment & Plan: Visit Diagnoses:  1. Acute bilateral low back pain without sciatica   2. Other spondylosis with radiculopathy, lumbar region     Plan: In hopes of giving patient some improvement of his back pain he was given a Toradol 30 mg IM injection today and I sent in a prescription for Robaxin to take for spasms as needed.  Follow-up with Dr. Lorin Mercy in 3 weeks for recheck.  If he continues have ongoing symptoms he may need repeat lumbar MRI.  Follow-Up Instructions: Return in about 3 weeks (around 11/15/2021) for with dr yates recheck lumbar .   Orders:  Orders Placed This Encounter  Procedures   XR Lumbar Spine 2-3 Views   Meds ordered this encounter  Medications   ketorolac (TORADOL) 30 MG/ML injection 30 mg   methocarbamol (ROBAXIN) 500 MG tablet    Sig: Take 1 tablet (500 mg total) by mouth every 8 (eight) hours as needed for muscle spasms.    Dispense:  300 tablet    Refill:  0      Procedures: No procedures performed   Clinical Data: No additional findings.   Subjective: Chief Complaint  Patient presents with   Lower Back - Pain    HPI 70 year old white male comes in today with complaints of right-sided low back pain for couple weeks.  He is status post right L5-S1 microdiscectomy by Dr. Lorin Mercy July 2015.  Last seen in 2019.  He has tried Aleve once daily without improvement.  Denies any lower extremity radicular pain. Review of Systems No current cardiac pulmonary GI GU  Objective: Vital Signs: BP 138/82    Pulse 74    Ht 5' 7"  (1.702 m)    Wt 197 lb 3.2 oz (89.4 kg)    BMI 30.89 kg/m   Physical Exam HENT:     Head: Normocephalic.  Eyes:     Extraocular Movements:  Extraocular movements intact.  Pulmonary:     Effort: Pulmonary effort is normal.  Musculoskeletal:     Comments: Gait is normal.  Positive right-sided notch tenderness.  Negative on left side.  Negative logroll bilateral hips.  Mildly positive right straight leg raise.  No focal motor deficits.  Neurological:     Mental Status: He is alert.  Psychiatric:        Mood and Affect: Mood normal.    Ortho Exam  Specialty Comments:  No specialty comments available.  Imaging: No results found.   PMFS History: Patient Active Problem List   Diagnosis Date Noted   Low testosterone 11/21/2017   S/P PTCA (percutaneous transluminal coronary angioplasty) 11/18/2015   Coronary artery disease due to lipid rich plaque 11/18/2015   Elevated LFTs 11/18/2015   Wellness examination 10/18/2015   Smoker 10/18/2015   HNP (herniated nucleus pulposus), lumbar 05/18/2014   Hypercalcemia 09/29/2013   Special screening for malignant neoplasms, colon 09/27/2013   NASH (nonalcoholic steatohepatitis) 08/28/2012   Screening for prostate cancer 08/28/2012   Hypogonadism male 08/28/2012   Testosterone deficiency in male 07/22/2011   Fatigue 07/05/2011  RBBB 10/12/2010   HEMORRHOIDS-INTERNAL 06/18/2010   ARTHRALGIA 01/18/2010   TOBACCO USER 07/11/2009   POSTURAL LIGHTHEADEDNESS 07/11/2009   DIVERTICULOSIS, COLON 11/18/2008   BACK PAIN, LUMBAR 09/01/2008   SWEATING 07/26/2008   Diabetes ([place]) 01/20/2008   COUGH 01/20/2008   Dyslipidemia 06/02/2007   Essential hypertension 06/02/2007   Coronary atherosclerosis 06/02/2007   GERD 06/02/2007   Past Medical History:  Diagnosis Date   Anemia, iron deficiency 11/18/2008   Resolved, despite no rx--2012     ANEMIA-IRON DEFICIENCY    Arthritis    BACK PAIN, LUMBAR    Blood transfusion without reported diagnosis    CORONARY ARTERY DISEASE    DIVERTICULOSIS, COLON    DM    on no medication ?prediabetes   Elevated LFTs 11/18/2015   GERD    Hepatitis     States he was tested for Hep B and he had antibodies   HYPERLIPIDEMIA    HYPERTENSION    dr tom wall   Nephrolithiasis    PONV (postoperative nausea and vomiting)    little nausea after having his hip replacement   Unspecified disorder of urethra and urinary tract     Family History  Problem Relation Age of Onset   Colon cancer Mother        Colon Cancer-at advanced age- in her 68's per pt    Heart disease Father    Breast cancer Sister    Esophageal cancer Neg Hx    Rectal cancer Neg Hx    Stomach cancer Neg Hx     Past Surgical History:  Procedure Laterality Date   COLONOSCOPY     CORONARY ANGIOPLASTY WITH STENT PLACEMENT     06    dr t. wall   ELECTROCARDIOGRAM  03/30/2007   ESOPHAGOGASTRODUODENOSCOPY  11/07/2005   kidney stones     in office procedure to remove but no sedation surgeries for kidney stones    LUMBAR LAMINECTOMY Right 05/18/2014   Procedure: LUMBAR LAMINECTOMY Microdiscectomy Right Lumbar five - sacral one;  Surgeon: [name], MD;  Location: [facility];  Service: Orthopedics;  Laterality: Right;   Stress Cardiolite  11/30/2004   TOTAL HIP ARTHROPLASTY  10/28/2012   Procedure: TOTAL HIP ARTHROPLASTY;  Surgeon: [name], MD;  Location: [facility];  Service: Orthopedics;  Laterality: Right;   UPPER GASTROINTESTINAL ENDOSCOPY     Social History   Occupational History   Occupation: Gaffer: [employer]    Comment: works Scientist, research (medical)   Occupation: Retired  Tobacco Use   Smoking status: Every Day    Packs/day: 1.00    Years: 48.00    Pack years: 48.00    Types: Cigarettes   Smokeless tobacco: Never   Tobacco comments:    states he wants to reduce his dependence.  Vaping Use   Vaping Use: Never used  Substance and Sexual Activity   Alcohol use: Yes    Alcohol/week: 0.0 standard drinks    Comment: rare   Drug use: No   Sexual activity: Not Currently

## 2021-11-15 ENCOUNTER — Other Ambulatory Visit: Payer: Self-pay

## 2021-11-15 ENCOUNTER — Ambulatory Visit (INDEPENDENT_AMBULATORY_CARE_PROVIDER_SITE_OTHER): Payer: Commercial Managed Care - HMO | Admitting: Surgery

## 2021-11-15 ENCOUNTER — Ambulatory Visit: Payer: Self-pay

## 2021-11-15 ENCOUNTER — Encounter: Payer: Self-pay | Admitting: Surgery

## 2021-11-15 DIAGNOSIS — Z96641 Presence of right artificial hip joint: Secondary | ICD-10-CM

## 2021-11-15 DIAGNOSIS — M25551 Pain in right hip: Secondary | ICD-10-CM

## 2021-11-15 DIAGNOSIS — M4726 Other spondylosis with radiculopathy, lumbar region: Secondary | ICD-10-CM | POA: Diagnosis not present

## 2021-11-15 NOTE — Progress Notes (Signed)
71 year old white male returns for recheck of his low back pain.  Last office visit we did a Toradol 30 mg IM injection and prescribed Robaxin for spasms and he states that his pain is gone.  He is very pleased with this point.  He is status post right shoulder replacement by Dr. Kathryne Hitch October 28, 2012 and his hip is doing great but we had previously discussed that he should have yearly x-rays of his hips to check his implants.  He would like to have x-ray today.   X-ray Right hip shows good alignment and seating of his prosthesis.  No complicating features.  Exam Gait is normal.  Very pleasant male alert and oriented in no acute distress.  Plan Patient will follow-up in 1 year for recheck of his right total hip.  If his back symptoms flareup over the next several weeks he will call and let me know and I will get a lumbar MRI.  All questions answered.

## 2021-11-16 ENCOUNTER — Telehealth: Payer: Self-pay | Admitting: Pharmacist

## 2021-11-16 NOTE — Chronic Care Management (AMB) (Signed)
° ° °  Chronic Care Management Pharmacy Assistant   Name: [name] [name]  MRN: [id number] DOB: 1951/04/05  11/20/2021 APPOINTMENT REMINDER   Called Zigm[facility] Daniel, No answer, left message of appointment on 11/20/2021 at 3:00 via telephone visit with Jeni Salles Pharm D. Notified to have all medications, supplements, blood pressure and/or blood sugar logs available during appointment and to return call if need to reschedule.  Care Gaps: AWV - message sent to Ramond Craver Last BP - 138/82 on 10/25/2021 Last A1C - 6.8 on 09/26/2021 Ophthalmology - overdue  Star Rating Drug: Farxiga 93m - last filled 09/27/2021 [facility] Lisinopril 410m- last filled 10/15/2021 [facility] Metformin ER 50079m last filled 09/15/2021 [facility] Rosuvastatin 5mg56mlast filled 10/13/2021 [facility]  Any gaps in medications fill history? No  JaniGennie Alma [facility]inCater[facility] manager-[phone]

## 2021-11-20 ENCOUNTER — Ambulatory Visit (INDEPENDENT_AMBULATORY_CARE_PROVIDER_SITE_OTHER): Payer: Medicare Other | Admitting: Pharmacist

## 2021-11-20 DIAGNOSIS — E119 Type 2 diabetes mellitus without complications: Secondary | ICD-10-CM

## 2021-11-20 DIAGNOSIS — E785 Hyperlipidemia, unspecified: Secondary | ICD-10-CM

## 2021-11-20 NOTE — Progress Notes (Signed)
Chronic Care Management Pharmacy Note  11/20/2021 Name:  [name] [name] MRN:  [id number] DOB:  [birth year]  Summary: A1c at goal < 7% LDL extremely low Pt still feels tired during the day  Recommendations/Changes made from today's visit: -Recommended trial of melatonin 5 mg timed release to avoid grogginess and help with staying asleep -Recommend repeat lipid panel to confirm extremely low LDL and consideration of therapy modification -Recommend repeat testosterone level as it has been > 1 year since last lab work -Recommend repeat microalbumin as it has been > 1 year since last repeated -Recommended for patient to request eye exam report  Plan: Follow up in 1 year   Subjective: [name] [name] is an 71 y.o. year old male who is a primary patient of Isaac Bliss, Rayford Halsted, MD.  The CCM team was consulted for assistance with disease management and care coordination needs.    Engaged with patient by telephone for follow up visit in response to provider referral for pharmacy case management and/or care coordination services.   Consent to Services:  The patient was given information about Chronic Care Management services, agreed to services, and gave verbal consent prior to initiation of services.  Please see initial visit note for detailed documentation.   Patient Care Team: Isaac Bliss, Rayford Halsted, MD as PCP - General (Internal Medicine) Freada Bergeron, MD as PCP - Cardiology (Cardiology) Verl Blalock, Marijo Conception, MD (Inactive) (Cardiology) Magdalen Spatz, NP as Nurse Practitioner (Pulmonary Disease) Dorothy Spark, MD as Consulting Physician (Cardiology) Viona Gilmore, [facility] as Pharmacist (Pharmacist)  Recent office visits: 01/09/21 Domingo Mend, MD: Patient presented for annual exam. Removed cerumen impaction. A1c increased. Vitamin B12 elevated.  Recent consult visits: 11/15/21 Benjiman Core, PA-C (ortho): Patient presented for lower back pain follow up.  10/25/21  Benjiman Core, PA-C (ortho): Patient presented for lower back pain. Administered a Toradol injection.  09/26/21 [name], MD (endo): Patient presented for DM follow up. A1c stable at 6.8%.   09/18/21 Nicholes Rough, PA-C (cardiology): Patient presented for CAD follow up. No medication changes made.  Hospital visits: None in previous 6 months  Objective:  Lab Results  Component Value Date   CREATININE 0.86 01/09/2021   BUN 19 01/09/2021   GFR 88.81 01/09/2021   GFRNONAA 96 09/11/2020   GFRAA 111 09/11/2020   NA 142 01/09/2021   K 4.7 01/09/2021   CALCIUM 9.9 01/09/2021   CO2 29 01/09/2021   GLUCOSE 106 (H) 01/09/2021    Lab Results  Component Value Date/Time   HGBA1C 6.8 (A) 09/26/2021 03:16 PM   HGBA1C 6.8 (A) 03/22/2021 02:18 PM   HGBA1C 7.1 (H) 01/09/2021 02:15 PM   HGBA1C 6.2 (H) 09/11/2020 02:56 PM   HGBA1C 6.3 02/08/2019 02:12 PM   GFR 88.81 01/09/2021 02:15 PM   GFR 93.51 12/23/2019 02:44 PM   MICROALBUR 0.9 11/19/2016 04:01 PM   MICROALBUR 2.4 (H) 10/18/2015 02:19 PM    Last diabetic Eye exam:  Lab Results  Component Value Date/Time   HMDIABEYEEXA No Retinopathy 07/25/2020 12:00 AM    Last diabetic Foot exam: No results found for: HMDIABFOOTEX   Lab Results  Component Value Date   CHOL 88 01/09/2021   HDL 59.60 01/09/2021   LDLCALC 1 01/09/2021   LDLDIRECT 118 (H) 08/23/2019   TRIG 134.0 01/09/2021   CHOLHDL 1 01/09/2021    Hepatic Function Latest Ref Rng & Units 01/09/2021 09/11/2020 12/23/2019  Total Protein 6.0 - 8.3 g/dL 7.1 7.0 7.5  Albumin 3.5 - 5.2 g/dL 4.6 4.6 4.8  AST 0 - 37 U/L 21 26 60(H)  ALT 0 - 53 U/L 28 28 97(H)  Alk Phosphatase 39 - 117 U/L 47 61 57  Total Bilirubin 0.2 - 1.2 mg/dL 0.3 0.2 0.5  Bilirubin, Direct 0.00 - 0.40 mg/dL - - -    Lab Results  Component Value Date/Time   TSH 1.71 01/09/2021 02:15 PM   TSH 1.980 09/11/2020 02:56 PM    CBC Latest Ref Rng & Units 01/09/2021 09/11/2020 07/24/2020  WBC 4.0 - 10.5 K/uL 9.4 8.3 8.3   Hemoglobin 13.0 - 17.0 g/dL 13.1 14.7 15.3  Hematocrit 39.0 - 52.0 % 40.4 44.8 45.9  Platelets 150.0 - 400.0 K/uL 265.0 273 220    Lab Results  Component Value Date/Time   VD25OH 53.53 01/09/2021 02:15 PM   VD25OH 41.27 12/23/2019 02:44 PM    Clinical ASCVD: Yes  The ASCVD Risk score (Arnett DK, et al., 2019) failed to calculate for the following reasons:   The valid total cholesterol range is 130 to 320 mg/dL    Depression screen [facility] 2/9 01/09/2021 12/23/2019 12/16/2018  Decreased Interest 0 0 0  Down, Depressed, Hopeless 0 0 0  PHQ - 2 Score 0 0 0  Altered sleeping 0 0 -  Tired, decreased energy 0 0 -  Change in appetite 0 0 -  Feeling bad or failure about yourself  0 0 -  Trouble concentrating 0 0 -  Moving slowly or fidgety/restless 0 0 -  Suicidal thoughts 0 0 -  PHQ-9 Score 0 0 -  Difficult doing work/chores Not difficult at all Not difficult at all -  Some recent data might be hidden      Social History   Tobacco Use  Smoking Status Every Day   Packs/day: 1.00   Years: 48.00   Pack years: 48.00   Types: Cigarettes  Smokeless Tobacco Never  Tobacco Comments   states he wants to reduce his dependence.   BP Readings from Last 3 Encounters:  10/25/21 138/82  09/26/21 124/70  09/18/21 122/60   Pulse Readings from Last 3 Encounters:  10/25/21 74  09/26/21 92  09/18/21 73   Wt Readings from Last 3 Encounters:  10/25/21 197 lb 3.2 oz (89.4 kg)  09/26/21 197 lb 3.2 oz (89.4 kg)  09/18/21 199 lb 1.6 oz (90.3 kg)   BMI Readings from Last 3 Encounters:  10/25/21 30.89 kg/m  09/26/21 30.89 kg/m  09/18/21 31.18 kg/m    Assessment/Interventions: Review of patient past medical history, allergies, medications, health status, including review of consultants reports, laboratory and other test data, was performed as part of comprehensive evaluation and provision of chronic care management services.   SDOH:  (Social Determinants of Health) assessments and  interventions performed: No  SDOH Screenings   Alcohol Screen: Not on file  Depression (PHQ2-9): Low Risk    PHQ-2 Score: 0  Financial Resource Strain: Not on file  Food Insecurity: Not on file  Housing: Not on file  Physical Activity: Not on file  Social Connections: Not on file  Stress: Not on file  Tobacco Use: High Risk   Smoking Tobacco Use: Every Day   Smokeless Tobacco Use: Never   Passive Exposure: Not on file  Transportation Needs: Not on file    CCM Care Plan  Allergies  Allergen Reactions   Actos [Pioglitazone Hydrochloride]     Weight gain    Medications Reviewed Today  Reviewed by Precious Bard, [place] (Registered Medical Assistant) on 11/15/21 at 1534  Med List Status: <None>   Medication Order Taking? Sig Documenting Provider Last Dose Status Informant  Ascorbic Acid (VITAMIN C) 1000 MG tablet [id number] No Take 2,000 mg by mouth daily. [provider] Taking Active Self  aspirin EC 81 MG tablet [id number] No Take 1 tablet (81 mg total) by mouth daily. Isaac Bliss, Rayford Halsted, MD Taking Active   atenolol (TENORMIN) 25 MG tablet [id number] No Take 1 tablet (25 mg total) by mouth daily. Freada Bergeron, MD Taking Active   b complex vitamins tablet [id number] No Take 1 tablet by mouth daily. [provider] Taking Active   BIOTIN PO [id number] No Take by mouth. 551mg once daily [provider] Taking Active Self  Cholecalciferol (VITAMIN D) 2000 UNITS tablet [id number] Take 2,000 Units by mouth daily. [provider] Taking Active Self  Coenzyme Q10 (CO Q 10) 100 MG CAPS [id number] Take 200 mg by mouth daily.  [provider] Taking Active Self  Cyanocobalamin (VITAMIN B-12 PO) [id number] Take 1 tablet by mouth daily. [provider] Taking Active   Evolocumab (REPATHA SURECLICK) 1165MG/ML SOAJ [id number] Inject 1 pen into the skin every 14 (fourteen) days. PFreada Bergeron MD Taking Active    FARXIGA 10 MG TABS tablet [id number] TAKE 1 TABLET BY MOUTH DAILY BEFORE BREAKFAST. ERenato Shin MD  Active   FOLIC ACID PO [id number] Take 1 tablet by mouth daily. [provider] Taking Active   Ginkgo Biloba (GINKOBA PO) [id number] Take 1 tablet by mouth daily. [provider] Taking Active   icosapent Ethyl (VASCEPA) 1 g capsule [id number] Take 2 capsules (2 g total) by mouth 2 (two) times daily. NDorothy Spark MD Taking Active   lisinopril (ZESTRIL) 40 MG tablet [id number] Take 1 tablet (40 mg total) by mouth daily. PFreada Bergeron MD  Active   Magnesium 400 MG CAPS [id number] Take 400 mg by mouth daily.  [provider] Taking Active Self  metFORMIN (GLUCOPHAGE-XR) 500 MG 24 hr tablet [id number] Take 2 tablets (1,000 mg total) by mouth daily. ERenato Shin MD Taking Active   methocarbamol (ROBAXIN) 500 MG tablet [id number] Take 1 tablet (500 mg total) by mouth every 8 (eight) hours as needed for muscle spasms. OLanae Crumbly PA-C  Active   Multiple Vitamin (MULTIVITAMIN) tablet [id number] Take 1 tablet by mouth daily. [provider] Taking Active   nitroGLYCERIN (NITROSTAT) 0.4 MG SL tablet [id number] Place 1 tablet (0.4 mg total) under the tongue every 5 (five) minutes as needed for chest pain. NDorothy Spark MD Taking Active   [facility] test strip [id number] USE AS DIRECTED ERenato Shin MD Taking Active   pantoprazole (PROTONIX) 40 MG tablet [id number] TAKE 1 TABLET BY MOUTH EVERY DAY GGatha Mayer MD Taking Active   regadenoson ([facility] injection SOLN 0.4 mg [id number]  SDonato Heinz MD  Active   rosuvastatin (CRESTOR) 5 MG tablet [id number] TAKE 1 TABLET BY MOUTH EVERY DAY AT 6 PM HIsaac Bliss ERayford Halsted MD Taking Active   Semaglutide ([facility] 14 MG TABS [id number] Take 14 mg by mouth daily. ERenato Shin MD Taking Active   Testosterone 25 MG/2.5GM (1%) GEL [id number] Apply 50 mg  topically daily. HIsaac Bliss ERayford Halsted MD Taking Active   VITAMIN A PO [id number] Take 1 tablet  by mouth every other day.  [provider] Taking Active   vitamin E 100 UNIT capsule [id number] No Take 100 Units by mouth daily. [provider] Taking Active   Zinc 50 MG TABS [id number] No Take 50 mg by mouth daily. [provider] Taking Active Self            Patient Active Problem List   Diagnosis Date Noted   Low testosterone 11/21/2017   S/P PTCA (percutaneous transluminal coronary angioplasty) 11/18/2015   Coronary artery disease due to lipid rich plaque 11/18/2015   Elevated LFTs 11/18/2015   Wellness examination 10/18/2015   Smoker 10/18/2015   HNP (herniated nucleus pulposus), lumbar 05/18/2014   Hypercalcemia 09/29/2013   Special screening for malignant neoplasms, colon 09/27/2013   NASH (nonalcoholic steatohepatitis) 08/28/2012   Screening for prostate cancer 08/28/2012   Hypogonadism male 08/28/2012   Testosterone deficiency in male 07/22/2011   Fatigue 07/05/2011   RBBB 10/12/2010   HEMORRHOIDS-INTERNAL 06/18/2010   ARTHRALGIA 01/18/2010   TOBACCO USER 07/11/2009   POSTURAL LIGHTHEADEDNESS 07/11/2009   DIVERTICULOSIS, COLON 11/18/2008   BACK PAIN, LUMBAR 09/01/2008   SWEATING 07/26/2008   Diabetes ([place]) 01/20/2008   COUGH 01/20/2008   Dyslipidemia 06/02/2007   Essential hypertension 06/02/2007   Coronary atherosclerosis 06/02/2007   GERD 06/02/2007    Immunization History  Administered Date(s) Administered   Fluad Quad(high Dose 65+) 08/10/2019, 07/24/2020   Influenza Split 10/25/2011, 08/28/2012   Influenza Whole 09/01/2008   Influenza, High Dose Seasonal PF 11/20/2018   Influenza,inj,Quad PF,6+ Mos 09/27/2013, 10/14/2014, 10/18/2015, 11/19/2016, 11/21/2017   Influenza-Unspecified 08/13/2021   PFIZER(Purple Top)SARS-COV-2 Vaccination 12/13/2019, 01/09/2020, 08/25/2020   Pneumococcal Conjugate-13 05/20/2017   Pneumococcal  Polysaccharide-23 09/27/2013, 11/20/2018   Td 05/31/1998   Tdap 10/14/2014   Zoster Recombinat (Shingrix) 05/20/2017, 01/08/2019, 04/09/2019   Zoster, Live 10/25/2011   Patient reports he still feels tired some and doesn't usually sleep more than 3 hours at a time as he has to get up to go to the bathroom during the night. He has tried melatonin in the past but it made him groggy. He tries to get close to 6 hours of sleep per night and only takes naps sometimes but they last about 30 minutes.  Conditions to be addressed/monitored:  Hypertension, Hyperlipidemia, Diabetes, Coronary Artery Disease, GERD, Tobacco use and Back Pain, Hypogonadism  Conditions addressed this visit: Diabetes, hyperlipidemia  Care Plan : CCM Pharmacy Care Plan  Updates made by Viona Gilmore, [place] since 11/20/2021 12:00 AM     Problem: Problem: Hypertension, Hyperlipidemia, Diabetes, Coronary Artery Disease, GERD, Tobacco use and Back Pain, Hypogonadism      Long-Range Goal: Patient-Specific Goal   Start Date: 04/17/2021  Expected End Date: 04/17/2022  Recent Progress: On track  Priority: High  Note:   Current Barriers:  Unable to independently monitor therapeutic efficacy Unable to achieve control of diabetes   Pharmacist Clinical Goal(s):  Patient will achieve adherence to monitoring guidelines and medication adherence to achieve therapeutic efficacy through collaboration with PharmD and provider.   Interventions: 1:1 collaboration with Isaac Bliss, Rayford Halsted, MD regarding development and update of comprehensive plan of care as evidenced by provider attestation and co-signature Inter-disciplinary care team collaboration (see longitudinal plan of care) Comprehensive medication review performed; medication list updated in electronic medical record  Hypertension (BP goal <130/80) -Not ideally controlled -Current treatment: Atenolol (Tenormin) 53m, 1 tablet once daily (night)  - appropriate,  effective, safe, accessible Lisinopril 499m 1 tablet once daily (night)  -  appropriate, effective, safe, accessible -Medications previously tried: n/a  -Current home readings: does not check at home -Current dietary habits: did not discuss -Current exercise habits: did not discuss -Denies hypotensive/hypertensive symptoms -Educated on Importance of home blood pressure monitoring; Proper BP monitoring technique; -Counseled to monitor BP at home weekly, document, and provide log at future appointments -Counseled on diet and exercise extensively Recommended to continue current medication  Hyperlipidemia: (LDL goal < 70) -Controlled (LDL very low) -Current treatment: evolocumab (Repatha) 161m/ ml, inject 1 pen into skin every fourteen days  - appropriate, effective, safe, accessible icosapent ethyl (Vascepa) 1 g, take 2 capsules twice daily - appropriate, effective, safe, accessible Rosuvastatin 525m 1 tablet once daily at 6pm  - appropriate, effective, safe, accessible -Medications previously tried: atorvastatin, simvastatin (myalgia) -Current dietary patterns: did not discuss -Current exercise habits: did not discuss -Educated on Cholesterol goals;  Benefits of statin for ASCVD risk reduction; Importance of limiting foods high in cholesterol; Exercise goal of 150 minutes per week; -Recommended repeat lipid panel as LDL is extremely low  Coronary atherosclerosis  (Goal: prevent heart events) -Controlled -Current treatment  Aspirin 8155m tablet once daily  - appropriate, effective, safe, accessible evolocumab (Repatha) 140m34ml, inject 1 pen into skin every fourteen days  - appropriate, effective, safe, accessible Nitroglycerin 0.4mg 46m place 1 tablet under tongue every 5 minutes as needed for chest pain  - appropriate, effective, safe, accessible icosapent ethyl (Vascepa) 1g, take 2 capsule twice daily  - appropriate, effective, safe, accessible Rosuvastatin 5mg, 52mablet once  daily at 6pm  - appropriate, effective, safe, accessible -Medications previously tried: none  -Recommended to continue current medication   Diabetes (A1c goal <7%) -Controlled -Current medications: Rybelsus 14 mg 1 tablet daily  - appropriate, effective, safe, accessible Farxiga 10 mg daily  - appropriate, effective, safe, accessible Metformin XR 500 mg 2 tablets daily  - appropriate, effective, safe, accessible -Medications previously tried: n/a  -Current home glucose readings fasting glucose: 105 (every now and then) post prandial glucose: 121 -Denies hypoglycemic/hyperglycemic symptoms -Current meal patterns:  breakfast: did not discuss  lunch: did not discuss   dinner: did not discuss  snacks: did not discuss  drinks: did not discuss  -Current exercise: did not discuss  -Educated on A1c and blood sugar goals; Benefits of routine self-monitoring of blood sugar; Carbohydrate counting and/or plate method -Counseled to check feet daily and get yearly eye exams -Counseled on diet and exercise extensively Recommended to continue current medication  Tobacco use (Goal quit smoking) -Uncontrolled -Previous quit attempts: none -Current treatment  No medications -Patient smokes Within 30 minutes of waking -Patient triggers include:  enjoyment -On a scale of 1-10, reports MOTIVATION to quit is 1 -On a scale of 1-10, reports CONFIDENCE in quitting is 1 - Counseled on advantages of smoking cessation specifically for CVD benefits (reduces risk of stroke by 70%); patient reports he has already received the quit line phone number and does not need it again - Patient reports he has cut down from 2 PPD to 1 PPD due to only being about to smoke inside and truly enjoys it. He doesn't know if he will ever be ready to quit.   GERD (Goal: minimize symptoms) -Controlled -Current treatment  Pantoprazole 40 mg 1 tablet as needed (every week is different)  - appropriate, effective, safe,  accessible -Medications previously tried: none  -Counseled on non-pharmacologic management of symptoms such as elevating the head of your bed, avoiding eating 2-3 hours before bed,  avoiding triggering foods such as acidic, spicy, or fatty foods, eating smaller meals, and wearing clothes that are loose around the waist  Low testosterone (Goal: 350-750) -Uncontrolled -Current treatment  Testosterone 1% gel, apply 20mg every morning - appropriate, query effective -Medications previously tried: none  -Recommended repeat testosterone level  Back pain (Goal: minimize pain) -Controlled -Current treatment  Tizanidine 247m 1 tablet every eight hours as needed for muscle spasms  - appropriate, effective, safe, accessible -Medications previously tried: none  -Recommended to continue current medication  Health Maintenance -Vaccine gaps: COVID booster -Current therapy:  Vitamin C 100095m2000m51mce daily Vitmain B complex, 1 tablet once daily Cholecalciferol (vitamin D) 2000 units, 1 tablet once daily Cyanocobalamin (vitamin B12), 1 tablet once daily  Folic acid, 1 tablet once daily Ginkgo biloba, 1 tablet once daily  Multivitamin, 1 tablet once daily  Vitamin A, 1 tablet every other day  Vitamin E 100 units, 1 capsule once daily Zinc 50mg38mtablet once daily  Biotin 500mg 23m daily -Educated on Herbal supplement research is limited and benefits usually cannot be proven Cost vs benefit of each product must be carefully weighed by individual consumer Supplements may interfere with prescription drugs -Patient is satisfied with current therapy and denies issues -Counseled on duplication of multi-vitamin and separate vitamins. Patient reports wanting to add additional dose to day intake of vitamins.  Patient Goals/Self-Care Activities Patient will:  - take medications as prescribed check glucose daily, document, and provide at future appointments check blood pressure weekly, document,  and provide at future appointments target a minimum of 150 minutes of moderate intensity exercise weekly  Follow Up Plan: Telephone follow up appointment with care management team member scheduled for: 1 year       Medication Assistance: None required.  Patient affirms current coverage meets needs.  Compliance/Adherence/Medication fill history: Care Gaps: Eye exam Last BP - 138/82 on 10/25/2021 Last A1C - 6.8 on 09/26/2021  [facility]: Farxiga 10mg -35mt filled 09/27/2021 [facility] Lisinopril 40mg - 58m filled 10/15/2021 [facility] Metformin ER 500mg - l66mfilled 09/15/2021 [facility] Rosuvastatin 5mg - las30milled 10/13/2021 [facility]  Patient's preferred pharmacy is:  [id number] [id number], [place] [phone] box? Yes Pt endorses 100% compliance  We discussed: Current pharmacy is preferred with insurance plan and patient is satisfied with pharmacy services Patient decided to: Continue current medication management strategy  Care Plan and Follow Up Patient Decision:  Patient agrees to Care Plan and Follow-up.  Plan: Telephone follow up appointment with care management team member scheduled for:  6 months  Deklan Minar PrJeni SallesCACP [facility] [phone]

## 2021-11-20 NOTE — Patient Instructions (Signed)
Hi [name] [name],  It was great to catch up with you again! Don't forget to try just the 5 mg melatonin timed release to see if this helps with staying asleep. Hopefully this will prevent grogginess and keep you asleep!  Please reach out to me if you have any questions or need anything before our follow up!  Best, Maddie  Jeni Salles, PharmD, [facility]   Visit Information   Goals Addressed   None    Patient Care Plan: CCM Pharmacy Care Plan     Problem Identified: Problem: Hypertension, Hyperlipidemia, Diabetes, Coronary Artery Disease, GERD, Tobacco use and Back Pain, Hypogonadism      Long-Range Goal: Patient-Specific Goal   Start Date: 04/17/2021  Expected End Date: 04/17/2022  Recent Progress: On track  Priority: High  Note:   Current Barriers:  Unable to independently monitor therapeutic efficacy Unable to achieve control of diabetes   Pharmacist Clinical Goal(s):  Patient will achieve adherence to monitoring guidelines and medication adherence to achieve therapeutic efficacy through collaboration with PharmD and provider.   Interventions: 1:1 collaboration with Isaac Bliss, Rayford Halsted, MD regarding development and update of comprehensive plan of care as evidenced by provider attestation and co-signature Inter-disciplinary care team collaboration (see longitudinal plan of care) Comprehensive medication review performed; medication list updated in electronic medical record  Hypertension (BP goal <130/80) -Not ideally controlled -Current treatment: Atenolol (Tenormin) 18m, 1 tablet once daily (night)  - appropriate, effective, safe, accessible Lisinopril 433m 1 tablet once daily (night)  - appropriate, effective, safe, accessible -Medications previously tried: n/a  -Current home readings: does not check at home -Current dietary habits: did not discuss -Current exercise habits: did not discuss -Denies  hypotensive/hypertensive symptoms -Educated on Importance of home blood pressure monitoring; Proper BP monitoring technique; -Counseled to monitor BP at home weekly, document, and provide log at future appointments -Counseled on diet and exercise extensively Recommended to continue current medication  Hyperlipidemia: (LDL goal < 70) -Controlled (LDL very low) -Current treatment: evolocumab (Repatha) 14057mml, inject 1 pen into skin every fourteen days  - appropriate, effective, safe, accessible icosapent ethyl (Vascepa) 1 g, take 2 capsules twice daily - appropriate, effective, safe, accessible Rosuvastatin 5mg62m tablet once daily at 6pm  - appropriate, effective, safe, accessible -Medications previously tried: atorvastatin, simvastatin (myalgia) -Current dietary patterns: did not discuss -Current exercise habits: did not discuss -Educated on Cholesterol goals;  Benefits of statin for ASCVD risk reduction; Importance of limiting foods high in cholesterol; Exercise goal of 150 minutes per week; -Recommended repeat lipid panel as LDL is extremely low  Coronary atherosclerosis  (Goal: prevent heart events) -Controlled -Current treatment  Aspirin 81mg59mablet once daily  - appropriate, effective, safe, accessible evolocumab (Repatha) 140mg/42m inject 1 pen into skin every fourteen days  - appropriate, effective, safe, accessible Nitroglycerin 0.4mg SL66mlace 1 tablet under tongue every 5 minutes as needed for chest pain  - appropriate, effective, safe, accessible icosapent ethyl (Vascepa) 1g, take 2 capsule twice daily  - appropriate, effective, safe, accessible Rosuvastatin 5mg, 1 61mlet once daily at 6pm  - appropriate, effective, safe, accessible -Medications previously tried: none  -Recommended to continue current medication   Diabetes (A1c goal <7%) -Controlled -Current medications: Rybelsus 14 mg 1 tablet daily  - appropriate, effective, safe, accessible Farxiga 10 mg  daily  - appropriate, effective, safe, accessible Metformin XR 500 mg 2 tablets daily  - appropriate, effective, safe, accessible -Medications previously tried: n/a  -  Current home glucose readings fasting glucose: 105 (every now and then) post prandial glucose: 121 -Denies hypoglycemic/hyperglycemic symptoms -Current meal patterns:  breakfast: did not discuss  lunch: did not discuss   dinner: did not discuss  snacks: did not discuss  drinks: did not discuss  -Current exercise: did not discuss  -Educated on A1c and blood sugar goals; Benefits of routine self-monitoring of blood sugar; Carbohydrate counting and/or plate method -Counseled to check feet daily and get yearly eye exams -Counseled on diet and exercise extensively Recommended to continue current medication  Tobacco use (Goal quit smoking) -Uncontrolled -Previous quit attempts: none -Current treatment  No medications -Patient smokes Within 30 minutes of waking -Patient triggers include:  enjoyment -On a scale of 1-10, reports MOTIVATION to quit is 1 -On a scale of 1-10, reports CONFIDENCE in quitting is 1 - Counseled on advantages of smoking cessation specifically for CVD benefits (reduces risk of stroke by 70%); patient reports he has already received the quit line phone number and does not need it again - Patient reports he has cut down from 2 PPD to 1 PPD due to only being about to smoke inside and truly enjoys it. He doesn't know if he will ever be ready to quit.   GERD (Goal: minimize symptoms) -Controlled -Current treatment  Pantoprazole 40 mg 1 tablet as needed (every week is different)  - appropriate, effective, safe, accessible -Medications previously tried: none  -Counseled on non-pharmacologic management of symptoms such as elevating the head of your bed, avoiding eating 2-3 hours before bed, avoiding triggering foods such as acidic, spicy, or fatty foods, eating smaller meals, and wearing clothes that are  loose around the waist  Low testosterone (Goal: 350-750) -Uncontrolled -Current treatment  Testosterone 1% gel, apply 67mg every morning - appropriate, query effective -Medications previously tried: none  -Recommended repeat testosterone level  Back pain (Goal: minimize pain) -Controlled -Current treatment  Tizanidine 240m 1 tablet every eight hours as needed for muscle spasms  - appropriate, effective, safe, accessible -Medications previously tried: none  -Recommended to continue current medication  Health Maintenance -Vaccine gaps: COVID booster -Current therapy:  Vitamin C 100075m2000m62mce daily Vitmain B complex, 1 tablet once daily Cholecalciferol (vitamin D) 2000 units, 1 tablet once daily Cyanocobalamin (vitamin B12), 1 tablet once daily  Folic acid, 1 tablet once daily Ginkgo biloba, 1 tablet once daily  Multivitamin, 1 tablet once daily  Vitamin A, 1 tablet every other day  Vitamin E 100 units, 1 capsule once daily Zinc 50mg60mtablet once daily  Biotin 500mg 33m daily -Educated on Herbal supplement research is limited and benefits usually cannot be proven Cost vs benefit of each product must be carefully weighed by individual consumer Supplements may interfere with prescription drugs -Patient is satisfied with current therapy and denies issues -Counseled on duplication of multi-vitamin and separate vitamins. Patient reports wanting to add additional dose to day intake of vitamins.  Patient Goals/Self-Care Activities Patient will:  - take medications as prescribed check glucose daily, document, and provide at future appointments check blood pressure weekly, document, and provide at future appointments target a minimum of 150 minutes of moderate intensity exercise weekly  Follow Up Plan: Telephone follow up appointment with care management team member scheduled for: 1 year       Patient verbalizes understanding of instructions provided today and agrees to  view in MyChar[facility] follow up appointment with pharmacy team member scheduled for: 1 year  MadeliViona GilmoreNorth [facility]

## 2021-12-05 DIAGNOSIS — H25011 Cortical age-related cataract, right eye: Secondary | ICD-10-CM | POA: Diagnosis not present

## 2021-12-05 DIAGNOSIS — H2511 Age-related nuclear cataract, right eye: Secondary | ICD-10-CM | POA: Diagnosis not present

## 2021-12-06 DIAGNOSIS — H2512 Age-related nuclear cataract, left eye: Secondary | ICD-10-CM | POA: Diagnosis not present

## 2021-12-06 DIAGNOSIS — R69 Illness, unspecified: Secondary | ICD-10-CM | POA: Diagnosis not present

## 2021-12-11 DIAGNOSIS — E785 Hyperlipidemia, unspecified: Secondary | ICD-10-CM | POA: Diagnosis not present

## 2021-12-11 DIAGNOSIS — E119 Type 2 diabetes mellitus without complications: Secondary | ICD-10-CM

## 2021-12-11 DIAGNOSIS — I10 Essential (primary) hypertension: Secondary | ICD-10-CM | POA: Diagnosis not present

## 2021-12-12 ENCOUNTER — Ambulatory Visit: Payer: Medicare Other | Admitting: Cardiology

## 2021-12-19 DIAGNOSIS — H2512 Age-related nuclear cataract, left eye: Secondary | ICD-10-CM | POA: Diagnosis not present

## 2022-01-10 ENCOUNTER — Ambulatory Visit (INDEPENDENT_AMBULATORY_CARE_PROVIDER_SITE_OTHER): Payer: Medicare Other | Admitting: Internal Medicine

## 2022-01-10 ENCOUNTER — Encounter: Payer: Self-pay | Admitting: Internal Medicine

## 2022-01-10 VITALS — BP 110/70 | HR 94 | Temp 98.0°F | Ht 67.5 in | Wt 202.5 lb

## 2022-01-10 DIAGNOSIS — I2510 Atherosclerotic heart disease of native coronary artery without angina pectoris: Secondary | ICD-10-CM

## 2022-01-10 DIAGNOSIS — I739 Peripheral vascular disease, unspecified: Secondary | ICD-10-CM

## 2022-01-10 DIAGNOSIS — K7581 Nonalcoholic steatohepatitis (NASH): Secondary | ICD-10-CM

## 2022-01-10 DIAGNOSIS — R7989 Other specified abnormal findings of blood chemistry: Secondary | ICD-10-CM | POA: Diagnosis not present

## 2022-01-10 DIAGNOSIS — Z125 Encounter for screening for malignant neoplasm of prostate: Secondary | ICD-10-CM

## 2022-01-10 DIAGNOSIS — F17200 Nicotine dependence, unspecified, uncomplicated: Secondary | ICD-10-CM

## 2022-01-10 DIAGNOSIS — I2583 Coronary atherosclerosis due to lipid rich plaque: Secondary | ICD-10-CM | POA: Diagnosis not present

## 2022-01-10 DIAGNOSIS — Z0000 Encounter for general adult medical examination without abnormal findings: Secondary | ICD-10-CM | POA: Diagnosis not present

## 2022-01-10 DIAGNOSIS — I10 Essential (primary) hypertension: Secondary | ICD-10-CM

## 2022-01-10 DIAGNOSIS — E1159 Type 2 diabetes mellitus with other circulatory complications: Secondary | ICD-10-CM | POA: Diagnosis not present

## 2022-01-10 DIAGNOSIS — E785 Hyperlipidemia, unspecified: Secondary | ICD-10-CM

## 2022-01-10 DIAGNOSIS — K219 Gastro-esophageal reflux disease without esophagitis: Secondary | ICD-10-CM | POA: Diagnosis not present

## 2022-01-10 LAB — COMPREHENSIVE METABOLIC PANEL
ALT: 33 U/L (ref 0–53)
AST: 27 U/L (ref 0–37)
Albumin: 4.7 g/dL (ref 3.5–5.2)
Alkaline Phosphatase: 45 U/L (ref 39–117)
BUN: 12 mg/dL (ref 6–23)
CO2: 26 mEq/L (ref 19–32)
Calcium: 9.5 mg/dL (ref 8.4–10.5)
Chloride: 107 mEq/L (ref 96–112)
Creatinine, Ser: 0.83 mg/dL (ref 0.40–1.50)
GFR: 89.14 mL/min (ref >60.00)
Glucose, Bld: 111 mg/dL — ABNORMAL HIGH (ref 70–99)
Potassium: 4.3 mEq/L (ref 3.5–5.1)
Sodium: 142 mEq/L (ref 135–145)
Total Bilirubin: 0.4 mg/dL (ref 0.2–1.2)
Total Protein: 7.1 g/dL (ref 6.0–8.3)

## 2022-01-10 LAB — CBC WITH DIFFERENTIAL/PLATELET
Basophils Absolute: 0.2 10*3/uL — ABNORMAL HIGH (ref 0.0–0.1)
Basophils Relative: 2.1 % (ref 0.0–3.0)
Eosinophils Absolute: 0.1 10*3/uL (ref 0.0–0.7)
Eosinophils Relative: 1.7 % (ref 0.0–5.0)
HCT: 31.1 % — ABNORMAL LOW (ref 39.0–52.0)
Hemoglobin: 9.3 g/dL — ABNORMAL LOW (ref 13.0–17.0)
Lymphocytes Relative: 21.2 % (ref 12.0–46.0)
Lymphs Abs: 1.7 10*3/uL (ref 0.7–4.0)
MCHC: 29.9 g/dL — ABNORMAL LOW (ref 30.0–36.0)
MCV: 61.6 fl — ABNORMAL LOW (ref 78.0–100.0)
Monocytes Absolute: 0.6 10*3/uL (ref 0.1–1.0)
Monocytes Relative: 8.2 % (ref 3.0–12.0)
Neutro Abs: 5.2 10*3/uL (ref 1.4–7.7)
Neutrophils Relative %: 66.8 % (ref 43.0–77.0)
Platelets: 256 10*3/uL (ref 150.0–400.0)
RBC: 5.05 Mil/uL (ref 4.22–5.81)
RDW: 19.5 % — ABNORMAL HIGH (ref 11.5–15.5)
WBC: 7.8 10*3/uL (ref 4.0–10.5)

## 2022-01-10 LAB — LIPID PANEL
Cholesterol: 77 mg/dL (ref 0–200)
HDL: 56.7 mg/dL (ref >39.00)
LDL Cholesterol: -3 mg/dL — ABNORMAL LOW (ref 0–99)
NonHDL: 20.22
Total CHOL/HDL Ratio: 1
Triglycerides: 114 mg/dL (ref 0.0–149.0)
VLDL: 22.8 mg/dL (ref 0.0–40.0)

## 2022-01-10 LAB — HEMOGLOBIN A1C: Hgb A1c MFr Bld: 7.1 % — ABNORMAL HIGH (ref 4.6–6.5)

## 2022-01-10 LAB — PSA: PSA: 0.31 ng/mL (ref 0.10–4.00)

## 2022-01-10 NOTE — Progress Notes (Signed)
Established Patient Office Visit     This visit occurred during the SARS-CoV-2 public health emergency.  Safety protocols were in place, including screening questions prior to the visit, additional usage of staff PPE, and extensive cleaning of exam room while observing appropriate contact time as indicated for disinfecting solutions.    CC/Reason for Visit: Annual preventive exam and subsequent Medicare wellness visit  HPI: [name] [name] is a 71 y.o. male who is coming in today for the above mentioned reasons. Past Medical History is significant for: Coronary artery disease. Follows with cardiology routinely.  He received coronary stenting after a positive stress test in 2006; he remains very active and asymptomatic.  Denies anginal chest discomfort.  Has a history of benign essential hypertension that has been well controlled, hyperlipidemia on a statin, continues to smoke 1 PPD and is not interested in smoking cessation at the moment, also has type 2 diabetes with a most recent A1c of 6.8. Also has a h/o testosterone deficiency.  He had his home nurse visit and was told on screening that he had possible decreased circulation of the right leg and is requesting formal testing.  He has no symptoms of claudication.   Past Medical/Surgical History: Past Medical History:  Diagnosis Date   Anemia, iron deficiency 11/18/2008   Resolved, despite no rx--2012     ANEMIA-IRON DEFICIENCY    Arthritis    BACK PAIN, LUMBAR    Blood transfusion without reported diagnosis    CORONARY ARTERY DISEASE    DIVERTICULOSIS, COLON    DM    on no medication ?prediabetes   Elevated LFTs 11/18/2015   GERD    Hepatitis    States he was tested for Hep B and he had antibodies   HYPERLIPIDEMIA    HYPERTENSION    dr tom wall   Nephrolithiasis    PONV (postoperative nausea and vomiting)    little nausea after having his hip replacement   Unspecified disorder of urethra and urinary tract     Past  Surgical History:  Procedure Laterality Date   COLONOSCOPY     CORONARY ANGIOPLASTY WITH STENT PLACEMENT     06    dr t. wall   ELECTROCARDIOGRAM  03/30/2007   ESOPHAGOGASTRODUODENOSCOPY  11/07/2005   kidney stones     in office procedure to remove but no sedation surgeries for kidney stones    LUMBAR LAMINECTOMY Right 05/18/2014   Procedure: LUMBAR LAMINECTOMY Microdiscectomy Right Lumbar five - sacral one;  Surgeon: [name], MD;  Location: [facility];  Service: Orthopedics;  Laterality: Right;   Stress Cardiolite  11/30/2004   TOTAL HIP ARTHROPLASTY  10/28/2012   Procedure: TOTAL HIP ARTHROPLASTY;  Surgeon: [name], MD;  Location: [facility];  Service: Orthopedics;  Laterality: Right;   UPPER GASTROINTESTINAL ENDOSCOPY      Social History:  reports that he has been smoking cigarettes. He has a 48.00 pack-year smoking history. He has never used smokeless tobacco. He reports current alcohol use. He reports that he does not use drugs.  Allergies: Allergies  Allergen Reactions   Actos [Pioglitazone Hydrochloride]     Weight gain    Family History:  Family History  Problem Relation Age of Onset   Colon cancer Mother        Colon Cancer-at advanced age- in her 39's per pt    Heart disease Father    Breast cancer Sister    Esophageal cancer Neg Hx  Rectal cancer Neg Hx    Stomach cancer Neg Hx      Current Outpatient Medications:    Ascorbic Acid (VITAMIN C) 1000 MG tablet, Take 2,000 mg by mouth daily., Disp: , Rfl:    aspirin EC 81 MG tablet, Take 1 tablet (81 mg total) by mouth daily., Disp: 30 tablet, Rfl: 11   atenolol (TENORMIN) 25 MG tablet, Take 1 tablet (25 mg total) by mouth daily., Disp: 90 tablet, Rfl: 1   b complex vitamins tablet, Take 1 tablet by mouth daily., Disp: , Rfl:    BIOTIN PO, Take by mouth. 545mg once daily, Disp: , Rfl:    Cholecalciferol (VITAMIN D) 2000 UNITS tablet, Take 2,000 Units by mouth daily., Disp: , Rfl:    Coenzyme Q10 (CO Q 10) 100  MG CAPS, Take 200 mg by mouth daily. , Disp: , Rfl:    Cyanocobalamin (VITAMIN B-12 PO), Take 1 tablet by mouth daily., Disp: , Rfl:    Evolocumab (REPATHA SURECLICK) 1474MG/ML SOAJ, Inject 1 pen into the skin every 14 (fourteen) days., Disp: 6 mL, Rfl: 3   FARXIGA 10 MG TABS tablet, TAKE 1 TABLET BY MOUTH DAILY BEFORE BREAKFAST., Disp: 90 tablet, Rfl: 3   FOLIC ACID PO, Take 1 tablet by mouth daily., Disp: , Rfl:    Ginkgo Biloba (GINKOBA PO), Take 1 tablet by mouth daily., Disp: , Rfl:    icosapent Ethyl (VASCEPA) 1 g capsule, Take 2 capsules (2 g total) by mouth 2 (two) times daily., Disp: 360 capsule, Rfl: 2   lisinopril (ZESTRIL) 40 MG tablet, Take 1 tablet (40 mg total) by mouth daily., Disp: 90 tablet, Rfl: 3   Magnesium 400 MG CAPS, Take 400 mg by mouth daily. , Disp: , Rfl:    metFORMIN (GLUCOPHAGE-XR) 500 MG 24 hr tablet, Take 2 tablets (1,000 mg total) by mouth daily., Disp: 180 tablet, Rfl: 3   methocarbamol (ROBAXIN) 500 MG tablet, Take 1 tablet (500 mg total) by mouth every 8 (eight) hours as needed for muscle spasms., Disp: 300 tablet, Rfl: 0   Multiple Vitamin (MULTIVITAMIN) tablet, Take 1 tablet by mouth daily., Disp: , Rfl:    nitroGLYCERIN (NITROSTAT) 0.4 MG SL tablet, Place 1 tablet (0.4 mg total) under the tongue every 5 (five) minutes as needed for chest pain., Disp: 30 tablet, Rfl: 11   ONETOUCH VERIO test strip, USE AS DIRECTED, Disp: 100 strip, Rfl: 0   pantoprazole (PROTONIX) 40 MG tablet, TAKE 1 TABLET BY MOUTH EVERY DAY, Disp: 90 tablet, Rfl: 3   rosuvastatin (CRESTOR) 5 MG tablet, TAKE 1 TABLET BY MOUTH EVERY DAY AT 6 PM, Disp: 90 tablet, Rfl: 1   Semaglutide (RYBELSUS) 14 MG TABS, Take 14 mg by mouth daily., Disp: 90 tablet, Rfl: 3   Testosterone 25 MG/2.5GM (1%) GEL, Apply 50 mg topically daily., Disp: 150 g, Rfl: 3   VITAMIN A PO, Take 1 tablet by mouth every other day. , Disp: , Rfl:    vitamin E 100 UNIT capsule, Take 100 Units by mouth daily., Disp: , Rfl:     Zinc 50 MG TABS, Take 50 mg by mouth daily., Disp: , Rfl:  No current facility-administered medications for this visit.  Facility-Administered Medications Ordered in Other Visits:    regadenoson (LEXISCAN) injection SOLN 0.4 mg, 0.4 mg, Intravenous, Once, SDonato Heinz MD  Review of Systems:  Constitutional: Denies fever, chills, diaphoresis, appetite change and fatigue.  HEENT: Denies photophobia, eye pain, redness, hearing loss, ear  pain, congestion, sore throat, rhinorrhea, sneezing, mouth sores, trouble swallowing, neck pain, neck stiffness and tinnitus.   Respiratory: Denies SOB, DOE, cough, chest tightness,  and wheezing.   Cardiovascular: Denies chest pain, palpitations and leg swelling.  Gastrointestinal: Denies nausea, vomiting, abdominal pain, diarrhea, constipation, blood in stool and abdominal distention.  Genitourinary: Denies dysuria, urgency, frequency, hematuria, flank pain and difficulty urinating.  Endocrine: Denies: hot or cold intolerance, sweats, changes in hair or nails, polyuria, polydipsia. Musculoskeletal: Denies myalgias, back pain, joint swelling, arthralgias and gait problem.  Skin: Denies pallor, rash and wound.  Neurological: Denies dizziness, seizures, syncope, weakness, light-headedness, numbness and headaches.  Hematological: Denies adenopathy. Easy bruising, personal or family bleeding history  Psychiatric/Behavioral: Denies suicidal ideation, mood changes, confusion, nervousness, sleep disturbance and agitation    Physical Exam: Vitals:   01/10/22 1309  BP: 110/70  Pulse: 94  Temp: 98 F (36.7 C)  TempSrc: Oral  SpO2: 97%  Weight: 202 lb 8 oz (91.9 kg)  Height: 5' 7.5" (1.715 m)    Body mass index is 31.25 kg/m.   Constitutional: NAD, calm, comfortable Eyes: PERRL, lids and conjunctivae normal, wears corrective lenses ENMT: Mucous membranes are moist. Posterior pharynx clear of any exudate or lesions. Normal dentition. Tympanic  membrane is pearly white, no erythema or bulging. Neck: normal, supple, no masses, no thyromegaly Respiratory: clear to auscultation bilaterally, no wheezing, no crackles. Normal respiratory effort. No accessory muscle use.  Cardiovascular: Regular rate and rhythm, no murmurs / rubs / gallops. No extremity edema. 2+ pedal pulses. No carotid bruits.  Abdomen: no tenderness, no masses palpated. No hepatosplenomegaly. Bowel sounds positive.  Musculoskeletal: no clubbing / cyanosis. No joint deformity upper and lower extremities. Good ROM, no contractures. Normal muscle tone.  Skin: no rashes, lesions, ulcers. No induration Neurologic: CN 2-12 grossly intact. Sensation intact, DTR normal. Strength 5/5 in all 4.  Psychiatric: Normal judgment and insight. Alert and oriented x 3. Normal mood.    Subsequent Medicare wellness visit   1. Risk factors, based on past  M,S,F -has a history of coronary artery disease, other CVD risk factors include hypertension, hyperlipidemia, tobacco usage, type 2 diabetes   2.  Physical activities: Only activities of daily living   3.  Depression/mood: Stable, not depressed   4.  Hearing: Mild hearing difficulties   5.  ADL's: Independent in all ADLs   6.  Fall risk: Low fall risk   7.  Home safety: No problems identified   8.  Height weight, and visual acuity: height and weight as above, vision:  Vision Screening   Right eye Left eye Both eyes  Without correction     With correction 20/32 20/25 20/25      9.  Counseling: Advised routine follow-up with the specialist team   10. Lab orders based on risk factors: Laboratory update will be reviewed   11. Referral : For vascular ABIs   12. Care plan: Follow-up with me in 1 year or sooner as needed   13. Cognitive assessment: No cognitive impairment   14. Screening: Patient provided with a written and personalized 5-10 year screening schedule in the AVS. yes   15. Provider List Update: PCP, cardiology,  endocrinology Dr. Loanne Drilling  16. Advance Directives: Full code   17. Opioids: Patient is not on any opioid prescriptions and has no risk factors for a substance use disorder.   [place] Office Visit from 01/10/2022 in [facility] at [facility]  PHQ-9 Total Score 0  Fall Risk 12/03/2018 12/16/2018 12/23/2019 01/09/2021 01/10/2022  Falls in the past year? 0 - 0 0 0  Was there an injury with Fall? - 0 0 0 0  Fall Risk Category Calculator - - 0 0 0  Fall Risk Category - - Low Low Low  Fall risk Follow up - - - - Falls evaluation completed     Impression and Plan:  Encounter for preventive health examination  -Recommend routine eye and dental care. -Immunizations: All immunizations are up-to-date and age-appropriate -Healthy lifestyle discussed in detail. -Labs to be updated today. -Colon cancer screening: 12/2020, 10-year callback -Breast cancer screening: Not applicable -Cervical cancer screening: Not applicable -Lung cancer screening: Referral for low-dose CT scan -Prostate cancer screening: PSA today -DEXA: Not applicable  Low testosterone -On testosterone supplementation  Coronary artery disease due to lipid rich plaque -Noted, stable, followed by cardiology  NASH (nonalcoholic steatohepatitis) -Check LFTs  Dyslipidemia  - Plan: Lipid panel -Check lipids today  Essential hypertension  - Plan: CBC with Differential/Platelet, Comprehensive metabolic panel -Well-controlled  Type 2 diabetes mellitus with other circulatory complication, without long-term current use of insulin ([place]) - Plan: Hemoglobin A1c  Gastroesophageal reflux disease without esophagitis -Stable on daily PPI therapy  TOBACCO USER -Not interested in discussing smoking cessation today  PAD (peripheral artery disease) ([place])  - Plan: VAS [place] ABI WITH/WO TBI    Patient Instructions  -Nice seeing you today!!  -Lab work today; will notify you once results are available.  -Schedule  follow up in 1 year or sooner as needed.      Lelon Frohlich, MD [facility]

## 2022-01-10 NOTE — Patient Instructions (Signed)
-  Nice seeing you today!!  -Lab work today; will notify you once results are available.  -Schedule follow up in 1 year or sooner as needed. 

## 2022-01-11 ENCOUNTER — Other Ambulatory Visit: Payer: Self-pay | Admitting: Internal Medicine

## 2022-01-11 DIAGNOSIS — E785 Hyperlipidemia, unspecified: Secondary | ICD-10-CM

## 2022-01-13 ENCOUNTER — Other Ambulatory Visit: Payer: Self-pay | Admitting: Internal Medicine

## 2022-01-13 DIAGNOSIS — R7989 Other specified abnormal findings of blood chemistry: Secondary | ICD-10-CM

## 2022-01-14 ENCOUNTER — Other Ambulatory Visit: Payer: Self-pay | Admitting: Internal Medicine

## 2022-01-14 ENCOUNTER — Other Ambulatory Visit: Payer: Self-pay | Admitting: *Deleted

## 2022-01-14 ENCOUNTER — Encounter: Payer: Self-pay | Admitting: Internal Medicine

## 2022-01-14 DIAGNOSIS — D509 Iron deficiency anemia, unspecified: Secondary | ICD-10-CM

## 2022-01-14 DIAGNOSIS — F17210 Nicotine dependence, cigarettes, uncomplicated: Secondary | ICD-10-CM

## 2022-01-14 DIAGNOSIS — Z87891 Personal history of nicotine dependence: Secondary | ICD-10-CM

## 2022-01-14 MED ORDER — ICOSAPENT ETHYL 1 G PO CAPS: 2.0000 g | ORAL_CAPSULE | Freq: Two times a day (BID) | ORAL | 2 refills | Status: DC

## 2022-01-18 ENCOUNTER — Other Ambulatory Visit: Payer: Medicare Other

## 2022-01-18 ENCOUNTER — Telehealth: Payer: Self-pay | Admitting: Pharmacist

## 2022-01-18 DIAGNOSIS — D509 Iron deficiency anemia, unspecified: Secondary | ICD-10-CM | POA: Diagnosis not present

## 2022-01-18 DIAGNOSIS — E785 Hyperlipidemia, unspecified: Secondary | ICD-10-CM

## 2022-01-18 MED ORDER — ROSUVASTATIN CALCIUM 5 MG PO TABS: 2.5000 mg | ORAL_TABLET | Freq: Every day | ORAL | 1 refills | Status: DC

## 2022-01-18 NOTE — Telephone Encounter (Signed)
Discussed patients labs with him. We will decrease rosuvastatin to 2.40m daily. Recheck direct LDL and apoB at apt in May. ?

## 2022-01-19 LAB — IRON,TIBC AND FERRITIN PANEL
%SAT: 3 % (calc) — ABNORMAL LOW (ref 20–48)
Ferritin: 2 ng/mL — ABNORMAL LOW (ref 24–380)
Iron: 17 ug/dL — ABNORMAL LOW (ref 50–180)
TIBC: 509 mcg/dL (calc) — ABNORMAL HIGH (ref 250–425)

## 2022-01-21 ENCOUNTER — Other Ambulatory Visit: Payer: Self-pay | Admitting: Internal Medicine

## 2022-01-21 DIAGNOSIS — D509 Iron deficiency anemia, unspecified: Secondary | ICD-10-CM

## 2022-01-22 ENCOUNTER — Other Ambulatory Visit (INDEPENDENT_AMBULATORY_CARE_PROVIDER_SITE_OTHER): Payer: Medicare Other

## 2022-01-22 DIAGNOSIS — D509 Iron deficiency anemia, unspecified: Secondary | ICD-10-CM | POA: Diagnosis not present

## 2022-01-22 LAB — URINALYSIS
Bilirubin Urine: NEGATIVE
Hgb urine dipstick: NEGATIVE
Ketones, ur: NEGATIVE
Leukocytes,Ua: NEGATIVE
Nitrite: NEGATIVE
Specific Gravity, Urine: <=1.005 — AB (ref 1.000–1.030)
Total Protein, Urine: NEGATIVE
Urine Glucose: >=1000 — AB
Urobilinogen, UA: 0.2 (ref 0.0–1.0)
pH: 5.5 (ref 5.0–8.0)

## 2022-01-29 ENCOUNTER — Other Ambulatory Visit: Payer: Self-pay

## 2022-01-29 ENCOUNTER — Ambulatory Visit
Admission: RE | Admit: 2022-01-29 | Discharge: 2022-01-29 | Disposition: A | Discharge status: Home / Self Care | Payer: Medicare Other | Source: Ambulatory Visit | Attending: Acute Care | Admitting: Acute Care

## 2022-01-29 DIAGNOSIS — Z87891 Personal history of nicotine dependence: Secondary | ICD-10-CM | POA: Diagnosis not present

## 2022-01-29 DIAGNOSIS — I2510 Atherosclerotic heart disease of native coronary artery without angina pectoris: Secondary | ICD-10-CM | POA: Diagnosis not present

## 2022-01-29 DIAGNOSIS — J439 Emphysema, unspecified: Secondary | ICD-10-CM | POA: Diagnosis not present

## 2022-01-29 DIAGNOSIS — F17210 Nicotine dependence, cigarettes, uncomplicated: Secondary | ICD-10-CM

## 2022-01-29 DIAGNOSIS — I700 Atherosclerosis of aorta: Secondary | ICD-10-CM | POA: Diagnosis not present

## 2022-02-01 ENCOUNTER — Other Ambulatory Visit: Payer: Self-pay

## 2022-02-01 DIAGNOSIS — Z87891 Personal history of nicotine dependence: Secondary | ICD-10-CM

## 2022-02-01 DIAGNOSIS — F17210 Nicotine dependence, cigarettes, uncomplicated: Secondary | ICD-10-CM

## 2022-02-11 ENCOUNTER — Other Ambulatory Visit: Payer: Self-pay | Admitting: *Deleted

## 2022-02-11 MED ORDER — ATENOLOL 25 MG PO TABS
25.0000 mg | ORAL_TABLET | Freq: Every day | ORAL | 0 refills | Status: DC
Start: 2022-02-11 — End: 2022-02-28

## 2022-02-19 ENCOUNTER — Encounter: Payer: Self-pay | Admitting: Internal Medicine

## 2022-02-19 ENCOUNTER — Other Ambulatory Visit (INDEPENDENT_AMBULATORY_CARE_PROVIDER_SITE_OTHER): Payer: Medicare Other

## 2022-02-19 ENCOUNTER — Ambulatory Visit (INDEPENDENT_AMBULATORY_CARE_PROVIDER_SITE_OTHER): Payer: Medicare Other | Admitting: Internal Medicine

## 2022-02-19 VITALS — BP 132/80 | HR 80 | Ht 67.5 in | Wt 200.4 lb

## 2022-02-19 DIAGNOSIS — K760 Fatty (change of) liver, not elsewhere classified: Secondary | ICD-10-CM

## 2022-02-19 DIAGNOSIS — D509 Iron deficiency anemia, unspecified: Secondary | ICD-10-CM | POA: Diagnosis not present

## 2022-02-19 DIAGNOSIS — Z8719 Personal history of other diseases of the digestive system: Secondary | ICD-10-CM

## 2022-02-19 LAB — CBC WITH DIFFERENTIAL/PLATELET
Basophils Absolute: 0.1 10*3/uL (ref 0.0–0.1)
Basophils Relative: 1.9 % (ref 0.0–3.0)
Eosinophils Absolute: 0.1 10*3/uL (ref 0.0–0.7)
Eosinophils Relative: 0.9 % (ref 0.0–5.0)
HCT: 45.1 % (ref 39.0–52.0)
Hemoglobin: 14 g/dL (ref 13.0–17.0)
Lymphocytes Relative: 22.1 % (ref 12.0–46.0)
Lymphs Abs: 1.7 10*3/uL (ref 0.7–4.0)
MCHC: 31 g/dL (ref 30.0–36.0)
MCV: 76.9 fl — ABNORMAL LOW (ref 78.0–100.0)
Monocytes Absolute: 0.6 10*3/uL (ref 0.1–1.0)
Monocytes Relative: 7.7 % (ref 3.0–12.0)
Neutro Abs: 5.1 10*3/uL (ref 1.4–7.7)
Neutrophils Relative %: 67.4 % (ref 43.0–77.0)
Platelets: 265 10*3/uL (ref 150.0–400.0)
RBC: 5.87 Mil/uL — ABNORMAL HIGH (ref 4.22–5.81)
RDW: 38 % — ABNORMAL HIGH (ref 11.5–15.5)
WBC: 7.6 10*3/uL (ref 4.0–10.5)

## 2022-02-19 NOTE — Progress Notes (Signed)
? ?[name] [name] 71 y.o. June 17, 1951 [id number] ? ?Assessment & Plan:  ? ?Encounter Diagnoses  ?Name Primary?  ? Iron deficiency anemia, unspecified iron deficiency anemia type Yes  ? NAFLD (nonalcoholic fatty liver disease)   ? History of angiodysplasia of intestinal tract   ? ? ? ?I am suspicious that he has recurrent bleeding from angiodysplasia of the GI tract.  I think it would make the most sense for him to have an upper and endoscopy/enteroscopy procedure with availability of APC to treat any angiodysplasia that is seen.  That procedure needs to be performed at the hospital where we have a nursing shortage so off to put him on my list and to try to work him in in the next couple of months. ? ?We will recheck a CBC today. ? ?Further plans pending clinical course.  Note with colonoscopy 1 year ago no cause for blood loss anemia would not repeat that at this time. ? ?CC: Isaac Bliss, Rayford Halsted, MD ? ? ?Subjective:  ? ?Chief Complaint: Iron deficiency anemia ? ?HPI ?70 year old white man with a diagnosis of iron deficiency anemia found earlier this year, he had a colonoscopy 1 year ago that was notable for diverticulosis, there was also a diminutive rectal hyperplastic polyp.  He does have a remote history of angiodysplasia of the intestine status post ablation at enteroscopy in 2011.  CBC is as below ferritin as below.  He started iron therapy a couple of weeks ago.  He does not donate blood.  He has no upper GI symptoms.   He has not noted any signs of bleeding.  I.e. melena rectal bleeding etc.  No surgeries that would cause blood loss.  He does have iron in his diet.  He has been on PPI chronically for GERD for many years.  He does take an aspirin a day but no other NSAIDs listed.  Also has a history of nonalcoholic fatty liver disease but not cirrhosis.  Platelet count has been normal.  Rare alcohol if any. ? ?Originally found to have small bowel AVMs in 2007 at capsule endoscopy.  Small bowel  endoscopy (enteroscopy) with ablation of 2 jejunal AVMs carried out September 2011. ? ? ?  Latest Ref Rng & Units 01/10/2022  ?  1:48 PM 01/09/2021  ?  2:15 PM 09/11/2020  ?  2:56 PM  ?CBC  ?WBC 4.0 - 10.5 K/uL 7.8   9.4   8.3    ?Hemoglobin 13.0 - 17.0 g/dL 9.3   13.1   14.7    ?Hematocrit 39.0 - 52.0 % 31.1   40.4   44.8    ?Platelets 150.0 - 400.0 K/uL 256.0   265.0   273    ? ?Lab Results  ?Component Value Date  ? FERRITIN 2 (L) 01/18/2022  ? ? ?Screening chest CT with centrilobular emphysema stable pulmonary nodules, coronary artery calcification and thoracic aortic calcification upper abdomen "unremarkable" ?02/01/2022 ? ?Wt Readings from Last 3 Encounters:  ?02/19/22 200 lb 6.4 oz (90.9 kg)  ?01/10/22 202 lb 8 oz (91.9 kg)  ?10/25/21 197 lb 3.2 oz (89.4 kg)  ? ? ?Allergies  ?Allergen Reactions  ? Actos [Pioglitazone Hydrochloride]   ?  Weight gain  ? ?Current Meds  ?Medication Sig  ? Ascorbic Acid (VITAMIN C) 1000 MG tablet Take 2,000 mg by mouth daily.  ? aspirin EC 81 MG tablet Take 1 tablet (81 mg total) by mouth daily.  ? atenolol (TENORMIN) 25 MG tablet Take 1 tablet (  25 mg total) by mouth daily.  ? b complex vitamins tablet Take 1 tablet by mouth daily.  ? BIOTIN PO Take by mouth. 574mg once daily  ? Cholecalciferol (VITAMIN D) 2000 UNITS tablet Take 2,000 Units by mouth daily.  ? Coenzyme Q10 (CO Q 10) 100 MG CAPS Take 200 mg by mouth daily.   ? Cyanocobalamin (VITAMIN B-12 PO) Take 1 tablet by mouth daily.  ? Evolocumab (REPATHA SURECLICK) 1856MG/ML SOAJ Inject 1 pen into the skin every 14 (fourteen) days.  ? FARXIGA 10 MG TABS tablet TAKE 1 TABLET BY MOUTH DAILY BEFORE BREAKFAST.  ? FOLIC ACID PO Take 1 tablet by mouth daily.  ? Ginkgo Biloba (GINKOBA PO) Take 1 tablet by mouth daily.  ? icosapent Ethyl (VASCEPA) 1 g capsule Take 2 capsules (2 g total) by mouth 2 (two) times daily.  ? lisinopril (ZESTRIL) 40 MG tablet Take 1 tablet (40 mg total) by mouth daily.  ? Magnesium 400 MG CAPS Take 400 mg by  mouth daily.   ? metFORMIN (GLUCOPHAGE-XR) 500 MG 24 hr tablet Take 2 tablets (1,000 mg total) by mouth daily.  ? methocarbamol (ROBAXIN) 500 MG tablet Take 1 tablet (500 mg total) by mouth every 8 (eight) hours as needed for muscle spasms.  ? Multiple Vitamin (MULTIVITAMIN) tablet Take 1 tablet by mouth daily.  ? nitroGLYCERIN (NITROSTAT) 0.4 MG SL tablet Place 1 tablet (0.4 mg total) under the tongue every 5 (five) minutes as needed for chest pain.  ? ONETOUCH VERIO test strip USE AS DIRECTED  ? pantoprazole (PROTONIX) 40 MG tablet TAKE 1 TABLET BY MOUTH EVERY DAY  ? rosuvastatin (CRESTOR) 5 MG tablet Take 0.5 tablets (2.5 mg total) by mouth daily.  ? Semaglutide (RYBELSUS) 14 MG TABS Take 14 mg by mouth daily.  ? testosterone (ANDROGEL) 50 MG/5GM (1%) GEL APPLY 50 MG (ONE PACKET) TOPICALLY DAILY.  ? VITAMIN A PO Take 1 tablet by mouth every other day.   ? vitamin E 100 UNIT capsule Take 100 Units by mouth daily.  ? Zinc 50 MG TABS Take 50 mg by mouth daily.  ? ?Past Medical History:  ?Diagnosis Date  ? Anemia, iron deficiency 11/18/2008  ? Resolved, despite no rx--2012    ? ANEMIA-IRON DEFICIENCY   ? Arthritis   ? BACK PAIN, LUMBAR   ? Blood transfusion without reported diagnosis   ? CORONARY ARTERY DISEASE   ? DIVERTICULOSIS, COLON   ? DM   ? on no medication ?prediabetes  ? Elevated LFTs 11/18/2015  ? GERD   ? Hepatitis   ? States he was tested for Hep B and he had antibodies  ? HYPERLIPIDEMIA   ? HYPERTENSION   ? dr tom wall  ? Nephrolithiasis   ? PONV (postoperative nausea and vomiting)   ? little nausea after having his hip replacement  ? Unspecified disorder of urethra and urinary tract   ? ?Past Surgical History:  ?Procedure Laterality Date  ? COLONOSCOPY    ? CORONARY ANGIOPLASTY WITH STENT PLACEMENT    ? 06    dr t. wall  ? ELECTROCARDIOGRAM  03/30/2007  ? ESOPHAGOGASTRODUODENOSCOPY  11/07/2005  ? kidney stones    ? in office procedure to remove but no sedation surgeries for kidney stones   ? LUMBAR  LAMINECTOMY Right 05/18/2014  ? Procedure: LUMBAR LAMINECTOMY Microdiscectomy Right Lumbar five - sacral one;  Surgeon: MMarybelle Killings MD;  [place] [facility]  Service: Orthopedics;  Laterality: Right;  ? Stress  Cardiolite  11/30/2004  ? TOTAL HIP ARTHROPLASTY  10/28/2012  ? Procedure: TOTAL HIP ARTHROPLASTY;  Surgeon: [name], MD;  Location: [place];  Service: Orthopedics;  Laterality: Right;  ? UPPER GASTROINTESTINAL ENDOSCOPY    ? ?Social History  ? ?Social History Narrative  ? Originally from [place], [place]  ? Lives alone with dog, has a couple of friends he can rely on for support.   ? ?family history includes Breast cancer in his sister; Colon cancer in his mother; Heart disease in his father. ? ? ?Review of Systems ?As per HPI ? ?Objective:  ? Physical Exam ?@BP  132/80   Pulse 80   Ht 5' 7.5" (1.715 m)   Wt 200 lb 6.4 oz (90.9 kg)   BMI 30.92 kg/m? @ ? ?General:  NAD ?Eyes:   anicteric ?Lungs:  clear ?Heart::  S1S2 no rubs, murmurs or gallops ?Abdomen:  soft and nontender, BS+, positive abdominal obesity ?Ext:   no edema, cyanosis or clubbing ? ? ? ?Data Reviewed:  ?See HPI ? ?

## 2022-02-19 NOTE — Patient Instructions (Signed)
Your provider has requested that you go to the basement level for lab work before leaving today. Press "B" on the elevator. The lab is located at the first door on the left as you exit the elevator. ? ?Due to recent changes in healthcare laws, you may see the results of your imaging and laboratory studies on MyChart before your provider has had a chance to review them.  We understand that in some cases there may be results that are confusing or concerning to you. Not all laboratory results come back in the same time frame and the provider may be waiting for multiple results in order to interpret others.  Please give [place] 48 hours in order for your provider to thoroughly review all the results before contacting the office for clarification of your results.  ? ?We will be in contact about setting you up for a procedure at the hospital. ? ?I appreciate the opportunity to care for you. ?Silvano Rusk, MD, [facility] ?

## 2022-02-27 ENCOUNTER — Other Ambulatory Visit: Payer: Self-pay | Admitting: Endocrinology

## 2022-02-28 ENCOUNTER — Other Ambulatory Visit: Payer: Self-pay | Admitting: *Deleted

## 2022-02-28 MED ORDER — ATENOLOL 25 MG PO TABS: 25.0000 mg | ORAL_TABLET | Freq: Every day | ORAL | 1 refills | Status: DC

## 2022-03-14 NOTE — Progress Notes (Deleted)
?Cardiology Office Note:   ? ?Date:  03/14/2022  ? ?ID:  [name] [name], DOB 02/25/1951, MRN [id number] ? ?PCP:  [name] [name], [name] Halsted, MD ?  ?[facility] Providers ?Cardiologist:  [name] Bergeron, MD { ? ? ?Referring MD: [name] [name], [name] [name]*  ? ? ?History of Present Illness:   ? ?[name] [name] is a 71 y.o. male with a hx of CAD s/p PCI following positive stress test in 2006, HTN, HLD, DMII and tobacco use who was previously followed by Dr. Meda [name] who now returns to clinic for follow-up. ? ?Last seen in clinic on 03/2021 where he was doing well from a CV standpoint. ? ?Today, *** ? ?Past Medical History:  ?Diagnosis Date  ? Anemia, iron deficiency 11/18/2008  ? Resolved, despite no rx--2012    ? ANEMIA-IRON DEFICIENCY   ? Arthritis   ? BACK PAIN, LUMBAR   ? Blood transfusion without reported diagnosis   ? CORONARY ARTERY DISEASE   ? DIVERTICULOSIS, COLON   ? Elevated LFTs 11/18/2015  ? GERD   ? Hepatitis   ? States he was tested for Hep B and he had antibodies  ? HYPERLIPIDEMIA   ? HYPERTENSION   ? dr tom wall  ? Nephrolithiasis   ? PONV (postoperative nausea and vomiting)   ? little nausea after having his hip replacement  ? Type 2 diabetes mellitus ([place])   ? Unspecified disorder of urethra and urinary tract   ? ? ?Past Surgical History:  ?Procedure Laterality Date  ? COLONOSCOPY    ? CORONARY ANGIOPLASTY WITH STENT PLACEMENT    ? 06    dr t. wall  ? ELECTROCARDIOGRAM  03/30/2007  ? ESOPHAGOGASTRODUODENOSCOPY  11/07/2005  ? kidney stones    ? in office procedure to remove but no sedation surgeries for kidney stones   ? LUMBAR LAMINECTOMY Right 05/18/2014  ? Procedure: LUMBAR LAMINECTOMY Microdiscectomy Right Lumbar five - sacral one;  Surgeon: [name] Killings, MD;  Location: [place];  Service: Orthopedics;  Laterality: Right;  ? Stress Cardiolite  11/30/2004  ? TOTAL HIP ARTHROPLASTY  10/28/2012  ? Procedure: TOTAL HIP ARTHROPLASTY;  Surgeon: [name], MD;  Location: [place];  Service: Orthopedics;   Laterality: Right;  ? UPPER GASTROINTESTINAL ENDOSCOPY    ? ? ?Current Medications: ?No outpatient medications have been marked as taking for the 03/18/22 encounter (Appointment) with [name] Bergeron, MD.  ?  ? ?Allergies:   Actos [pioglitazone hydrochloride]  ? ?Social History  ? ?Socioeconomic History  ? Marital status: Single  ?  Spouse name: Not on file  ? Number of children: 0  ? Years of education: Not on file  ? Highest education level: Not on file  ?Occupational History  ? Occupation: COPIER  ?  Employer: [employer]  ?  Comment: works [employer]  ? Occupation: Retired  ?Tobacco Use  ? Smoking status: Every Day  ?  Packs/day: 1.00  ?  Years: 48.00  ?  Pack years: 48.00  ?  Types: Cigarettes  ? Smokeless tobacco: Never  ? Tobacco comments:  ?  states he wants to reduce his dependence.  ?Vaping Use  ? Vaping Use: Never used  ?Substance and Sexual Activity  ? Alcohol use: Yes  ?  Alcohol/week: 0.0 standard drinks  ?  Comment: rare  ? Drug use: No  ? Sexual activity: Not Currently  ?Other Topics Concern  ? Not on file  ?Social History Narrative  ? Originally from  [place], [place]  ? Lives alone with dog, has a couple of friends he can rely on for support.   ? ?Social Determinants of Health  ? ?Financial Resource Strain: Not on file  ?Food Insecurity: Not on file  ?Transportation Needs: Not on file  ?Physical Activity: Not on file  ?Stress: Not on file  ?Social Connections: Not on file  ?  ? ?Family History: ?The patient's family history includes Breast cancer in his sister; Colon cancer in his mother; Heart disease in his father. There is no history of Esophageal cancer, Rectal cancer, or Stomach cancer. ? ?ROS:   ?Please see the history of present illness.    ?Review of Systems  ?Constitutional:  Negative for chills and fever.  ?HENT:  Negative for hearing loss.   ?Eyes:  Negative for blurred vision and redness.  ?Respiratory:  Positive for cough.   ?Cardiovascular:  Negative for chest pain,  palpitations, orthopnea, claudication, leg swelling and PND.  ?Gastrointestinal:  Negative for melena, nausea and vomiting.  ?Genitourinary:  Negative for dysuria and flank pain.  ?Musculoskeletal:  Positive for joint pain.  ?Neurological:  Negative for dizziness and loss of consciousness.  ?Endo/Heme/Allergies:  Negative for polydipsia.  ?Psychiatric/Behavioral:  Negative for memory loss.   ? ?EKGs/Labs/Other Studies Reviewed:   ? ?The following studies were reviewed today: ?Myoview 08/2019: ?Inconclusive ECG with significant motion artifact. Diffuse upsloping ST depressions, appear to become horizontal in V3 and V4 leads. ?Nuclear stress EF: 60%. ?The left ventricular ejection fraction is normal (55-65%). ?Defect 1: There is a medium fixed defect of mild severity present in the basal inferior, mid inferior and apical inferior location. Given normal wall motion in this region, consistent with artifact ?The study is normal. ?This is a low risk study. ?  ? ?Recent Labs: ?01/10/2022: ALT 33; BUN 12; Creatinine, Ser 0.83; Potassium 4.3; Sodium 142 ?02/19/2022: Hemoglobin 14.0; Platelets 265.0  ?Recent Lipid Panel ?   ?Component Value Date/Time  ? CHOL 77 01/10/2022 1348  ? CHOL 86 (L) 09/11/2020 1456  ? TRIG 114.0 01/10/2022 1348  ? HDL 56.70 01/10/2022 1348  ? HDL 57 09/11/2020 1456  ? CHOLHDL 1 01/10/2022 1348  ? VLDL 22.8 01/10/2022 1348  ? [place] -3 (L) 01/10/2022 1348  ? [place] 11 09/11/2020 1456  ? LDLDIRECT 118 (H) 08/23/2019 1552  ? LDLDIRECT 125.0 12/16/2018 1246  ? ? ? ?Physical Exam:   ? ?VS:  There were no vitals taken for this visit.   ? ?Wt Readings from Last 3 Encounters:  ?02/19/22 200 lb 6.4 oz (90.9 kg)  ?01/10/22 202 lb 8 oz (91.9 kg)  ?10/25/21 197 lb 3.2 oz (89.4 kg)  ?  ? ?GEN:  Well nourished, well developed in no acute distress ?HEENT: Normal ?NECK: No JVD; No carotid bruits ?CARDIAC: RRR, no murmurs, rubs, gallops ?RESPIRATORY:  Trace expiratory wheezing ?ABDOMEN: Soft, non-tender,  non-distended ?MUSCULOSKELETAL:  No edema; No deformity  ?SKIN: Warm and dry ?NEUROLOGIC:  Alert and oriented x 3 ?PSYCHIATRIC:  Normal affect  ? ?ASSESSMENT:   ? ?No diagnosis found. ? ?PLAN:   ? ?In order of problems listed above: ?  ?#CAD s/p remote PCI in 2006: ?Unknown artery intervened upon. Stress test negative in 2020 with no ischemia or scar, LVEF 60-65%. Doing well with no anginal symptoms. Tolerating all medications as prescribed. ?-Continue vascepa, repatha ?-Continue ASA ?-Continue atenolol 49m daily ?-Continue lisinopril 445mdaily ? ?#HTN: ?Well controlled.  ?-Continue atenolol 2532maily ?-Continue lisinopril 28m59mily ? ?#HLD: ?  Well controlled.  ?-Continue repatha, low dose crestor 39m daily, and vascepa ? ?#DMII: ?-Continue metformin and farxiga ? ?#Tobacco use: ?-Not interested in quitting ?-Annual CT scans ? ? ?Medication Adjustments/Labs and Tests Ordered: ?Current medicines are reviewed at length with the patient today.  Concerns regarding medicines are outlined above.  ?No orders of the defined types were placed in this encounter. ? ?No orders of the defined types were placed in this encounter. ? ? ?There are no Patient Instructions on file for this visit. ?  ? ?Signed, ?HFreada Bergeron MD  ?03/14/2022 8:26 PM    ?[facility]?

## 2022-03-18 ENCOUNTER — Encounter: Payer: Self-pay | Admitting: Cardiology

## 2022-03-18 ENCOUNTER — Ambulatory Visit (INDEPENDENT_AMBULATORY_CARE_PROVIDER_SITE_OTHER): Payer: Medicare Other | Admitting: Cardiology

## 2022-03-18 VITALS — BP 130/80 | HR 79 | Ht 67.5 in | Wt 200.0 lb

## 2022-03-18 DIAGNOSIS — I2510 Atherosclerotic heart disease of native coronary artery without angina pectoris: Secondary | ICD-10-CM | POA: Diagnosis not present

## 2022-03-18 DIAGNOSIS — E782 Mixed hyperlipidemia: Secondary | ICD-10-CM | POA: Diagnosis not present

## 2022-03-18 DIAGNOSIS — E119 Type 2 diabetes mellitus without complications: Secondary | ICD-10-CM

## 2022-03-18 DIAGNOSIS — F17200 Nicotine dependence, unspecified, uncomplicated: Secondary | ICD-10-CM

## 2022-03-18 DIAGNOSIS — Z9861 Coronary angioplasty status: Secondary | ICD-10-CM

## 2022-03-18 DIAGNOSIS — D649 Anemia, unspecified: Secondary | ICD-10-CM | POA: Diagnosis not present

## 2022-03-18 DIAGNOSIS — D500 Iron deficiency anemia secondary to blood loss (chronic): Secondary | ICD-10-CM | POA: Diagnosis not present

## 2022-03-18 DIAGNOSIS — Z789 Other specified health status: Secondary | ICD-10-CM | POA: Diagnosis not present

## 2022-03-18 DIAGNOSIS — I2583 Coronary atherosclerosis due to lipid rich plaque: Secondary | ICD-10-CM

## 2022-03-18 DIAGNOSIS — I10 Essential (primary) hypertension: Secondary | ICD-10-CM

## 2022-03-18 LAB — CBC
Hematocrit: 48.5 % (ref 37.5–51.0)
Hemoglobin: 16.3 g/dL (ref 13.0–17.7)
MCH: 28.2 pg (ref 26.6–33.0)
MCHC: 33.6 g/dL (ref 31.5–35.7)
MCV: 84 fL (ref 79–97)
Platelets: 226 10*3/uL (ref 150–450)
RBC: 5.78 x10E6/uL (ref 4.14–5.80)
WBC: 8 10*3/uL (ref 3.4–10.8)

## 2022-03-18 NOTE — Patient Instructions (Signed)
Medication Instructions:  ?Your physician recommends that you continue on your current medications as directed. Please refer to the Current Medication list given to you today. ? ?*If you need a refill on your cardiac medications before your next appointment, please call your pharmacy* ? ? ?Lab Work: ?CBC ? ?If you have labs (blood work) drawn today and your tests are completely normal, you will receive your results only by: ?MyChart Message (if you have MyChart) OR ?A paper copy in the mail ?If you have any lab test that is abnormal or we need to change your treatment, we will call you to review the results. ? ? ?Testing/Procedures: ?Your physician has requested that you have an ankle brachial index (ABI). During this test an ultrasound and blood pressure cuff are used to evaluate the arteries that supply the arms and legs with blood. Allow thirty minutes for this exam. There are no restrictions or special instructions. ? ? ? ?Follow-Up: ?At [facility], you and your health needs are our priority.  As part of our continuing mission to provide you with exceptional heart care, we have created designated Provider Care Teams.  These Care Teams include your primary Cardiologist (physician) and Advanced Practice Providers (APPs -  Physician Assistants and Nurse Practitioners) who all work together to provide you with the care you need, when you need it. ? ?We recommend signing up for the patient portal called "MyChart".  Sign up information is provided on this After Visit Summary.  MyChart is used to connect with patients for Virtual Visits (Telemedicine).  Patients are able to view lab/test results, encounter notes, upcoming appointments, etc.  Non-urgent messages can be sent to your provider as well.   ?To learn more about what you can do with MyChart, go to [email or web address].   ? ?Your next appointment:   ?6 month(s) ? ?The format for your next appointment:   ?In Person ? ?Provider:   ?Freada Bergeron, MD    ? ? ?Other Instructions ? ? ?Important Information About Sugar ? ? ? ? ?  ?

## 2022-03-18 NOTE — Progress Notes (Signed)
?Cardiology Office Note:   ? ?Date:  03/18/2022  ? ?ID:  [name] [name], DOB 1951/02/27, MRN [id number] ? ?PCP:  Isaac Bliss, Rayford Halsted, MD ?  ?[facility] Providers ?Cardiologist:  Freada Bergeron, MD { ? ? ?Referring MD: Isaac Bliss, [name]*  ? ? ?History of Present Illness:   ? ?[name] [name] is a 71 y.o. male with a hx of CAD s/p PCI following positive stress test in 2006, HTN, HLD, DMII and tobacco use who was previously followed by [name] who now returns to clinic for follow-up. ? ?Last seen in clinic on 03/2021 where he was doing well from a CV standpoint. ? ?Today, the patient states that he recently developed internal bleeding issues. His hemoglobin dropped from 13.1 to 9.3, and he was started on iron. Within a month, his hemoglobin increased to 14. He denies noticing any hematochezia or melena. No nausea, chest pain, or dizziness. Of note, he reports that in the past his hemoglobin has been as low as 4 in the setting of AVMs. Plavix was stopped at that time. He followed up with Dr. [name] on 02/19/22 who recommended an upper and endoscopy/enteroscopy with availability of APC to treat any angiodysplasia that is seen. This is not yet scheduled due to a nursing shortage at the hospital. ? ?His regimen currently includes pantoprazole. Recently his rosuvastatin was cut in half due to undetectable LDL. ? ?He denies any palpitations, shortness of breath, or peripheral edema. No headaches, syncope, orthopnea, or PND. No pain in his legs while walking. ? ?Since his last visit he underwent successful cataract removal. ? ?Past Medical History:  ?Diagnosis Date  ? Anemia, iron deficiency 11/18/2008  ? Resolved, despite no rx--2012    ? ANEMIA-IRON DEFICIENCY   ? Arthritis   ? BACK PAIN, LUMBAR   ? Blood transfusion without reported diagnosis   ? CORONARY ARTERY DISEASE   ? DIVERTICULOSIS, COLON   ? Elevated LFTs 11/18/2015  ? GERD   ? Hepatitis   ? States he was tested for Hep B and he had  antibodies  ? HYPERLIPIDEMIA   ? HYPERTENSION   ? dr tom wall  ? Nephrolithiasis   ? PONV (postoperative nausea and vomiting)   ? little nausea after having his hip replacement  ? Type 2 diabetes mellitus ([place])   ? Unspecified disorder of urethra and urinary tract   ? ? ?Past Surgical History:  ?Procedure Laterality Date  ? COLONOSCOPY    ? CORONARY ANGIOPLASTY WITH STENT PLACEMENT    ? 06    dr t. wall  ? ELECTROCARDIOGRAM  03/30/2007  ? ESOPHAGOGASTRODUODENOSCOPY  11/07/2005  ? kidney stones    ? in office procedure to remove but no sedation surgeries for kidney stones   ? LUMBAR LAMINECTOMY Right 05/18/2014  ? Procedure: LUMBAR LAMINECTOMY Microdiscectomy Right Lumbar five - sacral one;  Surgeon: [name], MD;  Location: [facility];  Service: Orthopedics;  Laterality: Right;  ? Stress Cardiolite  11/30/2004  ? TOTAL HIP ARTHROPLASTY  10/28/2012  ? Procedure: TOTAL HIP ARTHROPLASTY;  Surgeon: [name], MD;  Location: [facility];  Service: Orthopedics;  Laterality: Right;  ? UPPER GASTROINTESTINAL ENDOSCOPY    ? ? ?Current Medications: ?Current Meds  ?Medication Sig  ? Ascorbic Acid (VITAMIN C) 1000 MG tablet Take 2,000 mg by mouth daily.  ? aspirin EC 81 MG tablet Take 1 tablet (81 mg total) by mouth daily.  ? atenolol (TENORMIN) 25 MG tablet Take 1  tablet (25 mg total) by mouth daily.  ? b complex vitamins tablet Take 1 tablet by mouth daily.  ? BIOTIN PO Take by mouth. 537mg once daily  ? Cholecalciferol (VITAMIN D) 2000 UNITS tablet Take 2,000 Units by mouth daily.  ? Coenzyme Q10 (CO Q 10) 100 MG CAPS Take 200 mg by mouth daily.   ? Cyanocobalamin (VITAMIN B-12 PO) Take 1 tablet by mouth daily.  ? Evolocumab (REPATHA SURECLICK) 1604MG/ML SOAJ Inject 1 pen into the skin every 14 (fourteen) days.  ? FARXIGA 10 MG TABS tablet TAKE 1 TABLET BY MOUTH DAILY BEFORE BREAKFAST.  ? FOLIC ACID PO Take 1 tablet by mouth daily.  ? Ginkgo Biloba (GINKOBA PO) Take 1 tablet by mouth daily.  ? icosapent Ethyl (VASCEPA) 1 g  capsule Take 2 capsules (2 g total) by mouth 2 (two) times daily.  ? lisinopril (ZESTRIL) 40 MG tablet Take 1 tablet (40 mg total) by mouth daily.  ? Magnesium 400 MG CAPS Take 400 mg by mouth daily.   ? metFORMIN (GLUCOPHAGE-XR) 500 MG 24 hr tablet TAKE 2 TABLETS BY MOUTH EVERY DAY  ? methocarbamol (ROBAXIN) 500 MG tablet Take 1 tablet (500 mg total) by mouth every 8 (eight) hours as needed for muscle spasms.  ? Multiple Vitamin (MULTIVITAMIN) tablet Take 1 tablet by mouth daily.  ? nitroGLYCERIN (NITROSTAT) 0.4 MG SL tablet Place 1 tablet (0.4 mg total) under the tongue every 5 (five) minutes as needed for chest pain.  ? ONETOUCH VERIO test strip USE AS DIRECTED  ? pantoprazole (PROTONIX) 40 MG tablet TAKE 1 TABLET BY MOUTH EVERY DAY  ? rosuvastatin (CRESTOR) 5 MG tablet Take 0.5 tablets (2.5 mg total) by mouth daily.  ? RYBELSUS 14 MG TABS TAKE 1 TABLET BY MOUTH EVERY DAY  ? testosterone (ANDROGEL) 50 MG/5GM (1%) GEL APPLY 50 MG (ONE PACKET) TOPICALLY DAILY.  ? VITAMIN A PO Take 1 tablet by mouth every other day.   ? vitamin E 100 UNIT capsule Take 100 Units by mouth daily.  ? Zinc 50 MG TABS Take 50 mg by mouth daily.  ?  ? ?Allergies:   Actos [pioglitazone hydrochloride]  ? ?Social History  ? ?Socioeconomic History  ? Marital status: Single  ?  Spouse name: Not on file  ? Number of children: 0  ? Years of education: Not on file  ? Highest education level: Not on file  ?Occupational History  ? Occupation: COPIER  ?  Employer: [employer]  ?  Comment: works [employer] ? Occupation: Retired  ?Tobacco Use  ? Smoking status: Every Day  ?  Packs/day: 1.00  ?  Years: 48.00  ?  Pack years: 48.00  ?  Types: Cigarettes  ? Smokeless tobacco: Never  ? Tobacco comments:  ?  states he wants to reduce his dependence.  ?Vaping Use  ? Vaping Use: Never used  ?Substance and Sexual Activity  ? Alcohol use: Yes  ?  Alcohol/week: 0.0 standard drinks  ?  Comment: rare  ? Drug use: No  ? Sexual activity: Not Currently   ?Other Topics Concern  ? Not on file  ?Social History Narrative  ? [place] Lives alone with dog, has a couple of friends he can rely on for support.   ? ?Social Determinants of Health  ? ?Financial Resource Strain: Not on file  ?Food Insecurity: Not on file  ?Transportation Needs: Not on file  ?Physical Activity: Not  on file  ?Stress: Not on file  ?Social Connections: Not on file  ?  ? ?Family History: ?The patient's family history includes Breast cancer in his sister; Colon cancer in his mother; Heart disease in his father. There is no history of Esophageal cancer, Rectal cancer, or Stomach cancer. ? ?ROS:   ?Please see the history of present illness.    ?Review of Systems  ?Constitutional:  Negative for chills and fever.  ?HENT:  Negative for hearing loss.   ?Eyes:  Negative for blurred vision and redness.  ?Respiratory:  Positive for cough.   ?Cardiovascular:  Negative for chest pain, palpitations, orthopnea, claudication, leg swelling and PND.  ?Gastrointestinal:  Negative for melena, nausea and vomiting.  ?Genitourinary:  Negative for dysuria and flank pain.  ?Musculoskeletal:  Positive for joint pain.  ?Neurological:  Negative for dizziness and loss of consciousness.  ?Endo/Heme/Allergies:  Negative for polydipsia.  ?Psychiatric/Behavioral:  Negative for memory loss.   ? ?EKGs/Labs/Other Studies Reviewed:   ? ?The following studies were reviewed today: ? ?CT Chest 01/29/2022: ?Cardiovascular: The heart size is normal. No substantial pericardial ?effusion. Coronary artery calcification is evident. Mild ?atherosclerotic calcification is noted in the wall of the thoracic ?aorta. ?  ?Mediastinum/Nodes: No mediastinal lymphadenopathy. No evidence for ?gross hilar lymphadenopathy although assessment is limited by the ?lack of intravenous contrast on the current study. The esophagus has ?normal imaging features. There is no axillary lymphadenopathy. ?  ?Lungs/Pleura: Centrilobular emphsyema  noted. Previously identified ?tiny bilateral pulmonary nodules are stable in the interval. No new ?suspicious pulmonary nodule or mass. No focal airspace ?consolidation. No pleural effusion. ?  ?Upper Abdomen: Unrem

## 2022-03-19 ENCOUNTER — Telehealth: Payer: Self-pay

## 2022-03-19 ENCOUNTER — Other Ambulatory Visit: Payer: Self-pay

## 2022-03-19 DIAGNOSIS — K922 Gastrointestinal hemorrhage, unspecified: Secondary | ICD-10-CM

## 2022-03-19 DIAGNOSIS — K5520 Angiodysplasia of colon without hemorrhage: Secondary | ICD-10-CM

## 2022-03-19 NOTE — Telephone Encounter (Signed)
Pt stated that he could make the appointment on 04/30/2022: ?Pt was scheduled for an enteroscopy on 04/30/2022 at 10:30 at [facility]:  Pt made aware Case ID [id number] ?Prep instructions were created for pt and send via my chart and via mail. Pt made aware ?Ambulatory GI referral made ?Pt verbalized understanding with all questions answered.  ? ?

## 2022-03-19 NOTE — Telephone Encounter (Signed)
-----   Message from Gatha Mayer, MD sent at 03/18/2022  5:06 PM EDT ----- ?Regarding: hospital case ?Needs enteroscopy at hospital ? ?See if we can get him on for 6/20 or 7/18 ? ?

## 2022-03-21 ENCOUNTER — Telehealth: Payer: Self-pay | Admitting: Pharmacist

## 2022-03-21 NOTE — Chronic Care Management (AMB) (Signed)
Chronic Care Management Pharmacy Assistant   Name: [name] [name]  MRN: [id number] DOB: 1951/01/05  Reason for Encounter: Disease State / Hypertension and Diabetes Assessment Call   Conditions to be addressed/monitored: HLD and DMII  Recent office visits:  01/10/2022 Thersa Salt MD - Patient was seen for Encounter for preventive health examination and additional issues. No medication changes. Follow up in 1 year.  Recent consult visits:  03/18/2022 Laurey Morale MD (cardiology) - Patient was seen for Atherosclerosis of native coronary artery of native heart without angina pectoris and additional issues. No medication changes. Follow up appt in 6 months.   02/19/2022 Silvano Rusk MD (GI) - Patient was seen for Iron deficiency anemia, unspecified iron deficiency anemia type and additional issues. No medication changes. No follow up noted.  Hospital visits:  None  Medications: Outpatient Encounter Medications as of 03/21/2022  Medication Sig   Ascorbic Acid (VITAMIN C) 1000 MG tablet Take 2,000 mg by mouth daily.   aspirin EC 81 MG tablet Take 1 tablet (81 mg total) by mouth daily.   atenolol (TENORMIN) 25 MG tablet Take 1 tablet (25 mg total) by mouth daily.   b complex vitamins tablet Take 1 tablet by mouth daily.   BIOTIN PO Take by mouth. 528mg once daily   Cholecalciferol (VITAMIN D) 2000 UNITS tablet Take 2,000 Units by mouth daily.   Coenzyme Q10 (CO Q 10) 100 MG CAPS Take 200 mg by mouth daily.    Cyanocobalamin (VITAMIN B-12 PO) Take 1 tablet by mouth daily.   Evolocumab (REPATHA SURECLICK) 1756MG/ML SOAJ Inject 1 pen into the skin every 14 (fourteen) days.   FARXIGA 10 MG TABS tablet TAKE 1 TABLET BY MOUTH DAILY BEFORE BREAKFAST.   FOLIC ACID PO Take 1 tablet by mouth daily.   Ginkgo Biloba (GINKOBA PO) Take 1 tablet by mouth daily.   icosapent Ethyl (VASCEPA) 1 g capsule Take 2 capsules (2 g total) by mouth 2 (two) times daily.   lisinopril  (ZESTRIL) 40 MG tablet Take 1 tablet (40 mg total) by mouth daily.   Magnesium 400 MG CAPS Take 400 mg by mouth daily.    metFORMIN (GLUCOPHAGE-XR) 500 MG 24 hr tablet TAKE 2 TABLETS BY MOUTH EVERY DAY   methocarbamol (ROBAXIN) 500 MG tablet Take 1 tablet (500 mg total) by mouth every 8 (eight) hours as needed for muscle spasms.   Multiple Vitamin (MULTIVITAMIN) tablet Take 1 tablet by mouth daily.   nitroGLYCERIN (NITROSTAT) 0.4 MG SL tablet Place 1 tablet (0.4 mg total) under the tongue every 5 (five) minutes as needed for chest pain.   ONETOUCH VERIO test strip USE AS DIRECTED   pantoprazole (PROTONIX) 40 MG tablet TAKE 1 TABLET BY MOUTH EVERY DAY   rosuvastatin (CRESTOR) 5 MG tablet Take 0.5 tablets (2.5 mg total) by mouth daily.   RYBELSUS 14 MG TABS TAKE 1 TABLET BY MOUTH EVERY DAY   testosterone (ANDROGEL) 50 MG/5GM (1%) GEL APPLY 50 MG (ONE PACKET) TOPICALLY DAILY.   VITAMIN A PO Take 1 tablet by mouth every other day.    vitamin E 100 UNIT capsule Take 100 Units by mouth daily.   Zinc 50 MG TABS Take 50 mg by mouth daily.   Facility-Administered Encounter Medications as of 03/21/2022  Medication   regadenoson (LEXISCAN) injection SOLN 0.4 mg  Fill History: FARXIGA  10 MG TABS 03/21/2022 90   REPATHA 1433MG/ML SURECLICK 029/51/884184   ICOSAPENT ETHYL 1 GRAM CAPSULE 01/14/2022  90   LISINOPRIL 40 MG TABLET 01/13/2022 90   PANTOPRAZOLE SOD DR 40 MG TAB 02/27/2022 90   ROSUVASTATIN CALCIUM 5 MG TAB 01/14/2022 90   RYBELSUS 14 MG TABLET 03/01/2022 90   METFORMIN HCL ER 500 MG TABLET 02/28/2022 90   Reviewed chart prior to disease state call. Spoke with patient regarding BP  Recent Office Vitals: BP Readings from Last 3 Encounters:  03/18/22 130/80  02/19/22 132/80  01/10/22 110/70   Pulse Readings from Last 3 Encounters:  03/18/22 79  02/19/22 80  01/10/22 94    Wt Readings from Last 3 Encounters:  03/18/22 200 lb (90.7 kg)  02/19/22 200 lb 6.4 oz (90.9 kg)   01/10/22 202 lb 8 oz (91.9 kg)     Kidney Function Lab Results  Component Value Date/Time   CREATININE 0.83 01/10/2022 01:48 PM   CREATININE 0.86 01/09/2021 02:15 PM   CREATININE 0.86 09/03/2016 01:48 PM   CREATININE 0.81 05/29/2016 01:58 PM   GFR 89.14 01/10/2022 01:48 PM   GFRNONAA 96 09/11/2020 02:56 PM   GFRAA 111 09/11/2020 02:56 PM       Latest Ref Rng & Units 01/10/2022    1:48 PM 01/09/2021    2:15 PM 09/11/2020    2:56 PM  BMP  Glucose 70 - 99 mg/dL 111   106   118    BUN 6 - 23 mg/dL 12   19   16     Creatinine 0.40 - 1.50 mg/dL 0.83   0.86   0.72    BUN/Creat Ratio 10 - 24   22    Sodium 135 - 145 mEq/L 142   142   145    Potassium 3.5 - 5.1 mEq/L 4.3   4.7   4.7    Chloride 96 - 112 mEq/L 107   107   107    CO2 19 - 32 mEq/L 26   29   22     Calcium 8.4 - 10.5 mg/dL 9.5   9.9   9.6     Recent Relevant Labs: Lab Results  Component Value Date/Time   HGBA1C 7.1 (H) 01/10/2022 01:48 PM   HGBA1C 6.8 (A) 09/26/2021 03:16 PM   HGBA1C 6.8 (A) 03/22/2021 02:18 PM   HGBA1C 7.1 (H) 01/09/2021 02:15 PM   HGBA1C 6.3 02/08/2019 02:12 PM   MICROALBUR 0.9 11/19/2016 04:01 PM   MICROALBUR 2.4 (H) 10/18/2015 02:19 PM    Kidney Function Lab Results  Component Value Date/Time   CREATININE 0.83 01/10/2022 01:48 PM   CREATININE 0.86 01/09/2021 02:15 PM   CREATININE 0.86 09/03/2016 01:48 PM   CREATININE 0.81 05/29/2016 01:58 PM   GFR 89.14 01/10/2022 01:48 PM   GFRNONAA 96 09/11/2020 02:56 PM   GFRAA 111 09/11/2020 02:56 PM  Current antihypertensive regimen:  Atenolol 25 mg daily Lisinopril 40 mg daily  How often are you checking your Blood Pressure? Patient states he is not checking blood pressures, states he was told his blood pressures are good and he did not need to check the anymore.   Current home BP readings: Patient is not checking  What recent interventions/DTPs have been made by any provider to improve Blood Pressure control since last CPP Visit: No recent  interventions  What diet changes have been made to improve Blood Pressure Control?  Patient follows no specific type of diet Breakfast - patient will have cigarettes and coffee Lunch - patient doesn't eat lunch Dinner - patient will have a meat vegetable and  starch  What exercise is being done to improve your Blood Pressure Control?  Patient walks daily   Current antihyperglycemic regimen:  Farxiga 10 mg daily Metformin 500 mg 2 tablets daily Rybelsus 14 mg daily  What recent interventions/DTPs have been made to improve glycemic control: No recent interventions have been made  Have there been any recent hospitalizations or ED visits since last visit with CPP? No recent hospital visits.  Patient denies hypoglycemic symptoms, including None  Patient denies hyperglycemic symptoms, including none  How often are you checking your blood sugar? Patient checks blood sugars 2 times per month  What are your blood sugars ranging?  After meals: Patient states his blood sugars are always between 91 -120  During the week, how often does your blood glucose drop below 70? Patient denies any readings below 70  Are you checking your feet daily/regularly? Patient does not check   Adherence Review: Is the patient currently on a STATIN medication? Yes Is the patient currently on ACE/ARB medication? Yes Does the patient have >5 day gap between last estimated fill dates? No   Care Gaps: AWV - previous message sent to Ramond Craver Last BP - 130/80 on 03/18/2022 Last A1C - 7.1 on 01/10/2022  Star Rating Drugs: Farxiga 10 mg - last filled 03/21/2022 90 [facility] Lisinopril 40 mg - last filled 01/13/2022 90 [facility] Rosuvastatin 5 mg - last filled 01/14/2022 90 [facility] Rybelsus 14 mg - last filled 03/01/2022 90 [facility] Metformin 500 mg - last filled 02/28/2022 90 DS at [place] Pharmacist Assistant [phone]

## 2022-03-22 ENCOUNTER — Ambulatory Visit (HOSPITAL_COMMUNITY)
Admission: RE | Admit: 2022-03-22 | Discharge: 2022-03-22 | Disposition: A | Discharge status: Home / Self Care | Payer: Medicare Other | Source: Ambulatory Visit | Attending: Cardiology | Admitting: Cardiology

## 2022-03-22 DIAGNOSIS — I2510 Atherosclerotic heart disease of native coronary artery without angina pectoris: Secondary | ICD-10-CM | POA: Diagnosis not present

## 2022-03-22 DIAGNOSIS — D500 Iron deficiency anemia secondary to blood loss (chronic): Secondary | ICD-10-CM | POA: Diagnosis not present

## 2022-03-22 DIAGNOSIS — I2583 Coronary atherosclerosis due to lipid rich plaque: Secondary | ICD-10-CM

## 2022-03-27 ENCOUNTER — Ambulatory Visit: Payer: Medicare Other | Admitting: Endocrinology

## 2022-04-11 ENCOUNTER — Encounter: Payer: Self-pay | Admitting: Internal Medicine

## 2022-04-11 ENCOUNTER — Ambulatory Visit (INDEPENDENT_AMBULATORY_CARE_PROVIDER_SITE_OTHER): Payer: Medicare Other | Admitting: Internal Medicine

## 2022-04-11 VITALS — BP 140/88 | HR 83 | Ht 67.5 in | Wt 198.6 lb

## 2022-04-11 DIAGNOSIS — E1165 Type 2 diabetes mellitus with hyperglycemia: Secondary | ICD-10-CM | POA: Diagnosis not present

## 2022-04-11 DIAGNOSIS — E1169 Type 2 diabetes mellitus with other specified complication: Secondary | ICD-10-CM | POA: Diagnosis not present

## 2022-04-11 DIAGNOSIS — E785 Hyperlipidemia, unspecified: Secondary | ICD-10-CM | POA: Diagnosis not present

## 2022-04-11 DIAGNOSIS — E1159 Type 2 diabetes mellitus with other circulatory complications: Secondary | ICD-10-CM

## 2022-04-11 LAB — POCT GLYCOSYLATED HEMOGLOBIN (HGB A1C): Hemoglobin A1C: 5.8 % — AB (ref 4.0–5.6)

## 2022-04-11 NOTE — Patient Instructions (Addendum)
Please continue: - Metformin ER 1000 mg 2x a day, with meals - Farxiga 10 mg before breakfast - Rybelsus 14 mg before breakfast  Please return in 6 months with your meter.  PATIENT INSTRUCTIONS FOR TYPE 2 DIABETES:  DIET AND EXERCISE Diet and exercise is an important part of diabetic treatment.  We recommended aerobic exercise in the form of brisk walking (working between 40-60% of maximal aerobic capacity, similar to brisk walking) for 150 minutes per week (such as 30 minutes five days per week) along with 3 times per week performing 'resistance' training (using various gauge rubber tubes with handles) 5-10 exercises involving the major muscle groups (upper body, lower body and core) performing 10-15 repetitions (or near fatigue) each exercise. Start at half the above goal but build slowly to reach the above goals. If limited by weight, joint pain, or disability, we recommend daily walking in a swimming pool with water up to waist to reduce pressure from joints while allow for adequate exercise.    BLOOD GLUCOSES Monitoring your blood glucoses is important for continued management of your diabetes. Please check your blood glucoses 2-4 times a day: fasting, before meals and at bedtime (you can rotate these measurements - e.g. one day check before the 3 meals, the next day check before 2 of the meals and before bedtime, etc.).   HYPOGLYCEMIA (low blood sugar) Hypoglycemia is usually a reaction to not eating, exercising, or taking too much insulin/ other diabetes drugs.  Symptoms include tremors, sweating, hunger, confusion, headache, etc. Treat IMMEDIATELY with 15 grams of Carbs: 4 glucose tablets  cup regular juice/soda 2 tablespoons raisins 4 teaspoons sugar 1 tablespoon honey Recheck blood glucose in 15 mins and repeat above if still symptomatic/blood glucose <100.  RECOMMENDATIONS TO REDUCE YOUR RISK OF DIABETIC COMPLICATIONS: * Take your prescribed MEDICATION(S) * Follow a DIABETIC  diet: Complex carbs, fiber rich foods, (monounsaturated and polyunsaturated) fats * AVOID saturated/trans fats, high fat foods, >2,300 mg salt per day. * EXERCISE at least 5 times a week for 30 minutes or preferably daily.  * DO NOT SMOKE OR DRINK more than 1 drink a day. * Check your FEET every day. Do not wear tightfitting shoes. Contact us if you develop an ulcer * See your EYE doctor once a year or more if needed * Get a FLU shot once a year * Get a PNEUMONIA vaccine once before and once after age 2 years  GOALS:  * Your Hemoglobin A1c of <7%  * fasting sugars need to be <130 * after meals sugars need to be <180 (2h after you start eating) * Your Systolic BP should be 614 or lower  * Your Diastolic BP should be 80 or lower  * Your HDL (Good Cholesterol) should be 40 or higher  * Your LDL (Bad Cholesterol) should be 100 or lower. * Your Triglycerides should be 150 or lower  * Your Urine microalbumin (kidney function) should be <30 * Your Body Mass Index should be 25 or lower

## 2022-04-11 NOTE — Progress Notes (Signed)
Patient ID: [name] [name], male   DOB: April 12, 1951, 71 y.o.   MRN: [id number]  HPI: [name] [name] is a 71 y.o.-year-old male, returning for follow-up for DM2, dx in 2008, non-insulin-dependent, uncontrolled, with circulatory complications (CAD). Pt. previously saw Dr. Loanne Drilling, last visit 6 months ago.  Reviewed HbA1c: Lab Results  Component Value Date   HGBA1C 7.1 (H) 01/10/2022   HGBA1C 6.8 (A) 09/26/2021   HGBA1C 6.8 (A) 03/22/2021   HGBA1C 7.1 (H) 01/09/2021   HGBA1C 6.2 (H) 09/11/2020   HGBA1C 6.7 (A) 05/16/2020   HGBA1C 7.2 (A) 02/15/2020   HGBA1C 7.4 (H) 12/23/2019   HGBA1C 6.7 (A) 08/10/2019   HGBA1C 6.3 (A) 02/08/2019   HGBA1C 6.3 02/08/2019   Pt is on a regimen of: - Metformin ER 1000 mg with dinner - Farxiga 10 mg before breakfast - Rybelsus 14 mg before breakfast He tried Actos but this caused weight gain.  Pt checks his sugars 2x a month and they are: - am: 144 - 2h after b'fast: n/c - before lunch: n/c - 2h after lunch: n/c - before dinner: 91 - 2h after dinner: n/c - bedtime: n/c - nighttime: n/c Lowest sugar was 91; he has hypoglycemia awareness at 70.  Highest sugar was 144. He stays up until 2-3 am.   Glucometer: One Touch Verio  - no CKD, last BUN/creatinine:  Lab Results  Component Value Date   BUN 12 01/10/2022   BUN 19 01/09/2021   CREATININE 0.83 01/10/2022   CREATININE 0.86 01/09/2021  On lisinopril 40 mg daily.  - + HL; last set of lipids: Lab Results  Component Value Date   CHOL 77 01/10/2022   HDL 56.70 01/10/2022   LDLCALC -3 (L) 01/10/2022   LDLDIRECT 118 (H) 08/23/2019   TRIG 114.0 01/10/2022   CHOLHDL 1 01/10/2022  On Repatha, Vascepa 2 g twice a day, Crestor 2.5 mg daily (reduced recently), and co-Q10.  - last eye exam was in 2022: No DR reportedly. Dr. Nicki Reaper.   - no numbness and tingling in his feet.  He is on ASA 81.  He does have a history of NASH-on vitamin E, anemia, HTN, hypogonadism-on testosterone injections,  history of nephrolithiasis, diverticulosis.  ROS: + see HPI No increased urination, blurry vision, nausea, chest pain.  Past Medical History:  Diagnosis Date   Anemia, iron deficiency 11/18/2008   Resolved, despite no rx--2012     ANEMIA-IRON DEFICIENCY    Arthritis    BACK PAIN, LUMBAR    Blood transfusion without reported diagnosis    CORONARY ARTERY DISEASE    DIVERTICULOSIS, COLON    Elevated LFTs 11/18/2015   GERD    Hepatitis    States he was tested for Hep B and he had antibodies   HYPERLIPIDEMIA    HYPERTENSION    dr tom wall   Nephrolithiasis    PONV (postoperative nausea and vomiting)    little nausea after having his hip replacement   Type 2 diabetes mellitus ([place])    Unspecified disorder of urethra and urinary tract    Past Surgical History:  Procedure Laterality Date   COLONOSCOPY     CORONARY ANGIOPLASTY WITH STENT PLACEMENT     06    dr t. wall   ELECTROCARDIOGRAM  03/30/2007   ESOPHAGOGASTRODUODENOSCOPY  11/07/2005   kidney stones     in office procedure to remove but no sedation surgeries for kidney stones    LUMBAR LAMINECTOMY Right 05/18/2014   Procedure: LUMBAR LAMINECTOMY  Microdiscectomy Right Lumbar five - sacral one;  Surgeon: [name], MD;  Location: [place];  Service: Orthopedics;  Laterality: Right;   Stress Cardiolite  11/30/2004   TOTAL HIP ARTHROPLASTY  10/28/2012   Procedure: TOTAL HIP ARTHROPLASTY;  Surgeon: [name], MD;  Location: [place];  Service: Orthopedics;  Laterality: Right;   UPPER GASTROINTESTINAL ENDOSCOPY     Social History   Socioeconomic History   Marital status: Single    Spouse name: Not on file   Number of children: 0   Years of education: Not on file   Highest education level: Not on file  Occupational History   Occupation: Gaffer: [employer]    Comment: works Scientist, research (medical)   Occupation: Retired  Tobacco Use   Smoking status: Every Day    Packs/day: 1.00    Years: 48.00    Pack  years: 48.00    Types: Cigarettes   Smokeless tobacco: Never   Tobacco comments:    states he wants to reduce his dependence.  Vaping Use   Vaping Use: Never used  Substance and Sexual Activity   Alcohol use: Yes    Alcohol/week: 0.0 standard drinks    Comment: rare   Drug use: No   Sexual activity: Not Currently  Other Topics Concern   Not on file  Social History Narrative   Originally from [place], [place]   Lives alone with dog, has a couple of friends he can rely on for support.    Social Determinants of Health   Financial Resource Strain: Not on file  Food Insecurity: Not on file  Transportation Needs: Not on file  Physical Activity: Not on file  Stress: Not on file  Social Connections: Not on file  Intimate Partner Violence: Not on file   Current Outpatient Medications on File Prior to Visit  Medication Sig Dispense Refill   Ascorbic Acid (VITAMIN C) 1000 MG tablet Take 2,000 mg by mouth daily.     aspirin EC 81 MG tablet Take 1 tablet (81 mg total) by mouth daily. 30 tablet 11   atenolol (TENORMIN) 25 MG tablet Take 1 tablet (25 mg total) by mouth daily. 90 tablet 1   b complex vitamins tablet Take 1 tablet by mouth daily.     BIOTIN PO Take by mouth. 549mg once daily     Cholecalciferol (VITAMIN D) 2000 UNITS tablet Take 2,000 Units by mouth daily.     Coenzyme Q10 (CO Q 10) 100 MG CAPS Take 200 mg by mouth daily.      Cyanocobalamin (VITAMIN B-12 PO) Take 1 tablet by mouth daily.     Evolocumab (REPATHA SURECLICK) 1128MG/ML SOAJ Inject 1 pen into the skin every 14 (fourteen) days. 6 mL 3   FARXIGA 10 MG TABS tablet TAKE 1 TABLET BY MOUTH DAILY BEFORE BREAKFAST. 90 tablet 3   FOLIC ACID PO Take 1 tablet by mouth daily.     Ginkgo Biloba (GINKOBA PO) Take 1 tablet by mouth daily.     icosapent Ethyl (VASCEPA) 1 g capsule Take 2 capsules (2 g total) by mouth 2 (two) times daily. 360 capsule 2   lisinopril (ZESTRIL) 40 MG tablet Take 1 tablet (40 mg total) by mouth  daily. 90 tablet 3   Magnesium 400 MG CAPS Take 400 mg by mouth daily.      metFORMIN (GLUCOPHAGE-XR) 500 MG 24 hr tablet TAKE 2 TABLETS BY MOUTH EVERY DAY 180 tablet  3   methocarbamol (ROBAXIN) 500 MG tablet Take 1 tablet (500 mg total) by mouth every 8 (eight) hours as needed for muscle spasms. 300 tablet 0   Multiple Vitamin (MULTIVITAMIN) tablet Take 1 tablet by mouth daily.     nitroGLYCERIN (NITROSTAT) 0.4 MG SL tablet Place 1 tablet (0.4 mg total) under the tongue every 5 (five) minutes as needed for chest pain. 30 tablet 11   ONETOUCH VERIO test strip USE AS DIRECTED 100 strip 0   pantoprazole (PROTONIX) 40 MG tablet TAKE 1 TABLET BY MOUTH EVERY DAY 90 tablet 3   rosuvastatin (CRESTOR) 5 MG tablet Take 0.5 tablets (2.5 mg total) by mouth daily. 45 tablet 1   RYBELSUS 14 MG TABS TAKE 1 TABLET BY MOUTH EVERY DAY 90 tablet 3   testosterone (ANDROGEL) 50 MG/5GM (1%) GEL APPLY 50 MG (ONE PACKET) TOPICALLY DAILY. 150 g 2   VITAMIN A PO Take 1 tablet by mouth every other day.      vitamin E 100 UNIT capsule Take 100 Units by mouth daily.     Zinc 50 MG TABS Take 50 mg by mouth daily.     Current Facility-Administered Medications on File Prior to Visit  Medication Dose Route Frequency Provider Last Rate Last Admin   regadenoson (LEXISCAN) injection SOLN 0.4 mg  0.4 mg Intravenous Once Donato Heinz, MD       Allergies  Allergen Reactions   Actos [Pioglitazone Hydrochloride]     Weight gain   Family History  Problem Relation Age of Onset   Colon cancer Mother        Colon Cancer-at advanced age- in her 44's per pt    Heart disease Father    Breast cancer Sister    Esophageal cancer Neg Hx    Rectal cancer Neg Hx    Stomach cancer Neg Hx    PE: BP 140/88 (BP Location: Left Arm, Patient Position: Sitting, Cuff Size: Normal)   Pulse 83   Ht 5' 7.5" (1.715 m)   Wt 198 lb 9.6 oz (90.1 kg)   SpO2 94%   BMI 30.65 kg/m  Wt Readings from Last 3 Encounters:  04/11/22 198 lb  9.6 oz (90.1 kg)  03/18/22 200 lb (90.7 kg)  02/19/22 200 lb 6.4 oz (90.9 kg)   Constitutional: overweight, in NAD Eyes: PERRLA, EOMI, no exophthalmos ENT: moist mucous membranes, no thyromegaly, no cervical lymphadenopathy Cardiovascular: RRR, No MRG Respiratory: CTA B Musculoskeletal: no deformities Skin: moist, warm, no rashes Neurological: no tremor with outstretched hands  ASSESSMENT: 1. DM2, noninsulin-dependent, uncontrolled, with complications - CAD  2. HL  PLAN:  1. Patient with long-standing, uncontrolled diabetes, on oral antidiabetic regimen, with suboptimal control.  Latest HbA1c was 7.1%, higher, 3 months ago. Today: HbA1c is much better: 5.8%.  Upon questioning, has anemia and just started iron.  This may have influenced the results.  He is not checking blood sugars frequently, only approximately twice a months.  I advised him to check at least every other day, rotating check times.  Whenever he is checking, sugars are slightly high in the morning and better later in the day.  For now, I do not have a good reason to change his regimen, but we discussed that after he starts checking more consistently, to let me know if he has Rybelsus. - I suggested to:  Patient Instructions  Please continue: - Metformin ER 1000 mg 2x a day, with meals - Farxiga 10 mg before breakfast -  Rybelsus 14 mg before breakfast  Please return in 6 months with your meter.  - Given information about CBG targets for treatment: 80-130 mg/dL before meals and <180 mg/dL after meals; target HbA1c <7%. - advised him to bring his meter at next visit - advised for yearly eye exams  - Return to clinic in 6 months-with meter  2. HL - Reviewed latest lipid panel: Very unusual LDL level, the rest of the fractions at goal: Lab Results  Component Value Date   CHOL 77 01/10/2022   HDL 56.70 01/10/2022   LDLCALC -3 (L) 01/10/2022   LDLDIRECT 118 (H) 08/23/2019   TRIG 114.0 01/10/2022   CHOLHDL 1  01/10/2022  -He is on an intensive lipid regimen with Crestor 2.5 mg daily (dose reduced after the above results returned), Vascepa 2 g twice a day, Repatha, and co-Q10.  Philemon Kingdom, MD PhD [facility] Endocrinology

## 2022-04-15 ENCOUNTER — Telehealth: Payer: Self-pay | Admitting: *Deleted

## 2022-04-15 NOTE — Telephone Encounter (Signed)
Patient calling for results of CT of the lung done 01/29/22.

## 2022-04-16 NOTE — Telephone Encounter (Signed)
Patient is aware of CT lung result

## 2022-04-18 ENCOUNTER — Encounter (HOSPITAL_COMMUNITY): Payer: Self-pay | Admitting: Internal Medicine

## 2022-04-24 ENCOUNTER — Other Ambulatory Visit: Payer: Medicare Other

## 2022-04-29 NOTE — Anesthesia Preprocedure Evaluation (Addendum)
Anesthesia Evaluation  Patient identified by MRN, date of birth, ID band Patient awake    Reviewed: Allergy & Precautions, NPO status , Patient's Chart, lab work & pertinent test results  Airway Mallampati: III  TM Distance: >3 FB Neck ROM: Full    Dental no notable dental hx. (+) Implants, Dental Advisory Given, Teeth Intact   Pulmonary Current Smoker,    Pulmonary exam normal breath sounds clear to auscultation       Cardiovascular hypertension, + CAD  Normal cardiovascular exam Rhythm:Regular Rate:Normal  08/2019 myoview ? Inconclusive ECG with significant motion artifact. Diffuse upsloping ST depressions, appear to become horizontal in V3 and V4 leads. ? Nuclear stress EF: 60%. ? The left ventricular ejection fraction is normal (55-65%). ? Defect 1: There is a medium fixed defect of mild severity present in the basal inferior, mid inferior and apical inferior location. Given normal wall motion in this region, consistent with artifact ? The study is normal. ? This is a low risk study.    Neuro/Psych    GI/Hepatic GERD  ,(+) Hepatitis -  Endo/Other  diabetes, Type 2  Renal/GU      Musculoskeletal   Abdominal   Peds  Hematology   Anesthesia Other Findings All: actos  Reproductive/Obstetrics                            Anesthesia Physical Anesthesia Plan  ASA: 3  Anesthesia Plan: MAC   Post-op Pain Management:    Induction: Intravenous  PONV Risk Score and Plan: Treatment may vary due to age or medical condition  Airway Management Planned: Natural Airway and Nasal Cannula  Additional Equipment: None  Intra-op Plan:   Post-operative Plan:   Informed Consent: I have reviewed the patients History and Physical, chart, labs and discussed the procedure including the risks, benefits and alternatives for the proposed anesthesia with the patient or authorized representative who has  indicated his/her understanding and acceptance.     Dental advisory given  Plan Discussed with: CRNA  Anesthesia Plan Comments: (Recurrrent Bleeding from Angiodysplasia for enteroscopy)       Anesthesia Quick Evaluation

## 2022-04-30 ENCOUNTER — Other Ambulatory Visit: Payer: Self-pay

## 2022-04-30 ENCOUNTER — Ambulatory Visit (HOSPITAL_COMMUNITY): Payer: Medicare Other | Admitting: Certified Registered Nurse Anesthetist

## 2022-04-30 ENCOUNTER — Ambulatory Visit (HOSPITAL_BASED_OUTPATIENT_CLINIC_OR_DEPARTMENT_OTHER): Payer: Medicare Other | Admitting: Certified Registered Nurse Anesthetist

## 2022-04-30 ENCOUNTER — Ambulatory Visit (HOSPITAL_COMMUNITY)
Admission: RE | Admit: 2022-04-30 | Discharge: 2022-04-30 | Disposition: A | Discharge status: Home / Self Care | Payer: Medicare Other | Attending: Internal Medicine | Admitting: Internal Medicine

## 2022-04-30 ENCOUNTER — Encounter (HOSPITAL_COMMUNITY)
Admission: RE | Disposition: A | Discharge status: Home / Self Care | Payer: Self-pay | Source: Home / Self Care | Attending: Internal Medicine

## 2022-04-30 DIAGNOSIS — K222 Esophageal obstruction: Secondary | ICD-10-CM

## 2022-04-30 DIAGNOSIS — E119 Type 2 diabetes mellitus without complications: Secondary | ICD-10-CM | POA: Diagnosis not present

## 2022-04-30 DIAGNOSIS — F17210 Nicotine dependence, cigarettes, uncomplicated: Secondary | ICD-10-CM

## 2022-04-30 DIAGNOSIS — K219 Gastro-esophageal reflux disease without esophagitis: Secondary | ICD-10-CM | POA: Insufficient documentation

## 2022-04-30 DIAGNOSIS — Z8719 Personal history of other diseases of the digestive system: Secondary | ICD-10-CM

## 2022-04-30 DIAGNOSIS — I10 Essential (primary) hypertension: Secondary | ICD-10-CM | POA: Diagnosis not present

## 2022-04-30 DIAGNOSIS — F17200 Nicotine dependence, unspecified, uncomplicated: Secondary | ICD-10-CM | POA: Insufficient documentation

## 2022-04-30 DIAGNOSIS — I2510 Atherosclerotic heart disease of native coronary artery without angina pectoris: Secondary | ICD-10-CM | POA: Insufficient documentation

## 2022-04-30 DIAGNOSIS — K449 Diaphragmatic hernia without obstruction or gangrene: Secondary | ICD-10-CM

## 2022-04-30 DIAGNOSIS — D509 Iron deficiency anemia, unspecified: Secondary | ICD-10-CM | POA: Diagnosis not present

## 2022-04-30 DIAGNOSIS — K5520 Angiodysplasia of colon without hemorrhage: Secondary | ICD-10-CM

## 2022-04-30 DIAGNOSIS — K922 Gastrointestinal hemorrhage, unspecified: Secondary | ICD-10-CM

## 2022-04-30 HISTORY — PX: ENTEROSCOPY: SHX5533

## 2022-04-30 HISTORY — PX: SUBMUCOSAL TATTOO INJECTION: SHX6856

## 2022-04-30 LAB — GLUCOSE, CAPILLARY
Glucose-Capillary: 141 mg/dL — ABNORMAL HIGH (ref 70–99)
Glucose-Capillary: 147 mg/dL — ABNORMAL HIGH (ref 70–99)

## 2022-04-30 SURGERY — ENTEROSCOPY
Anesthesia: Monitor Anesthesia Care

## 2022-04-30 MED ORDER — PROPOFOL 500 MG/50ML IV EMUL
INTRAVENOUS | Status: DC | PRN
  Administered 2022-04-30: 150 ug/kg/min via INTRAVENOUS

## 2022-04-30 MED ORDER — PROPOFOL 500 MG/50ML IV EMUL
INTRAVENOUS | Status: AC
  Filled 2022-04-30: qty 100

## 2022-04-30 MED ORDER — LACTATED RINGERS IV SOLN: INTRAVENOUS | Status: DC

## 2022-04-30 MED ORDER — PROPOFOL 500 MG/50ML IV EMUL
INTRAVENOUS | Status: AC
  Filled 2022-04-30: qty 50

## 2022-04-30 MED ORDER — SODIUM CHLORIDE 0.9 % IV SOLN: INTRAVENOUS | Status: DC

## 2022-04-30 MED ORDER — LIDOCAINE 2% (20 MG/ML) 5 ML SYRINGE
INTRAMUSCULAR | Status: DC | PRN
  Administered 2022-04-30: 40 mg via INTRAVENOUS

## 2022-04-30 MED ORDER — PROPOFOL 10 MG/ML IV BOLUS
INTRAVENOUS | Status: DC | PRN
  Administered 2022-04-30: 20 mg via INTRAVENOUS

## 2022-04-30 MED ORDER — PROPOFOL 10 MG/ML IV BOLUS
INTRAVENOUS | Status: AC
  Filled 2022-04-30: qty 20

## 2022-04-30 NOTE — Op Note (Addendum)
[facility] Patient Name: [name] [name] Procedure Date: 04/30/2022 MRN: [id number] Attending MD: Gatha Mayer , MD Date of Birth: [birth year] CSN: [id number] Age: 71 Admit Type: Inpatient Procedure:                Small bowel enteroscopy Indications:              Iron deficiency anemia, hx jejunal AVM's ablated                            2011, negative colonoscopy 2022 (screening exam -                            diverticulosis, rectal hyperplastic polyp) Providers:                Gatha Mayer, MD, Ladoris Gene, RN, Despina Pole, Technician, Christell Faith, CRNA Referring MD:              Medicines:                Monitored Anesthesia Care Complications:            No immediate complications. Estimated Blood Loss:     Estimated blood loss: none. Procedure:                Pre-Anesthesia Assessment:                           - Prior to the procedure, a History and Physical                            was performed, and patient medications and                            allergies were reviewed. The patient's tolerance of                            previous anesthesia was also reviewed. The risks                            and benefits of the procedure and the sedation                            options and risks were discussed with the patient.                            All questions were answered, and informed consent                            was obtained. Prior Anticoagulants: The patient has                            taken no previous anticoagulant or antiplatelet  agents. ASA Grade Assessment: III - A patient with                            severe systemic disease. After reviewing the risks                            and benefits, the patient was deemed in                            satisfactory condition to undergo the procedure.                           After obtaining informed consent, the endoscope  was                            passed under direct vision. Throughout the                            procedure, the patient's blood pressure, pulse, and                            oxygen saturations were monitored continuously. The                            PCF-H190TL ([id number]) Olympus slim colonoscope was                            introduced through the mouth and advanced to the                            proximal jejunum. The small bowel enteroscopy was                            accomplished without difficulty. The patient                            tolerated the procedure well. Scope In: Scope Out: Findings:      A non-obstructing Schatzki ring was found at the gastroesophageal       junction.      A 2 cm hiatal hernia was present.      There was no evidence of significant pathology in the entire examined       duodenum.      There was no evidence of significant pathology in the proximal jejunum.       Area was tattooed with an injection of 4 mL of Spot (carbon black).       Estimated blood loss: none. Impression:               - Non-obstructing Schatzki ring.                           - 2 cm hiatal hernia.                           - Normal examined duodenum.                           -  The examined portion of the jejunum was normal.                            Tattooed area of distal extent of exam as above.                           - No specimens collected. Moderate Sedation:      Not Applicable - Patient had care per Anesthesia. Recommendation:           - Patient has a contact number available for                            emergencies. The signs and symptoms of potential                            delayed complications were discussed with the                            patient. Return to normal activities tomorrow.                            Written discharge instructions were provided to the                            patient.                           - My office will  schedule a capsule endoscopy of                            small bowel to look for cause of iron deficiency                            anemia (dx: iron deficiency anemia, angiodysplasia                            of GI tract) - may not be able to reach if we see                            more AVM's but other causes of bleeding possible                            also and want to exclude if so Procedure Code(s):        --- Professional ---                           [phone], Small intestinal endoscopy, enteroscopy                            beyond second portion of duodenum, not including                            ileum; diagnostic, including collection of  specimen(s) by brushing or washing, when performed                            (separate procedure)                           44799, Unlisted procedure, small intestine Diagnosis Code(s):        --- Professional ---                           K22.2, Esophageal obstruction                           K44.9, Diaphragmatic hernia without obstruction or                            gangrene                           D50.9, Iron deficiency anemia, unspecified CPT copyright 2019 American Medical Association. All rights reserved. The codes documented in this report are preliminary and upon coder review may  be revised to meet current compliance requirements. Gatha Mayer, MD 04/30/2022 11:09:46 AM This report has been signed electronically. Number of Addenda: 0

## 2022-04-30 NOTE — Transfer of Care (Signed)
Immediate Anesthesia Transfer of Care Note  Patient: YITZCHOK CARRIGER  Procedure(s) Performed: ENTEROSCOPY SUBMUCOSAL TATTOO INJECTION  Patient Location: PACU  Anesthesia Type:MAC  Level of Consciousness: awake, alert  and oriented  Airway & Oxygen Therapy: Patient Spontanous Breathing and Patient connected to face mask oxygen  Post-op Assessment: Report given to RN, Post -op Vital signs reviewed and stable and Patient moving all extremities X 4  Post vital signs: Reviewed and stable  Last Vitals:  Vitals Value Taken Time  BP 138/72   Temp    Pulse 77 04/30/22 1101  Resp 21 04/30/22 1101  SpO2 99 % 04/30/22 1101  Vitals shown include unvalidated device data.  Last Pain:  Vitals:   04/30/22 0936  TempSrc: Temporal  PainSc: 0-No pain         Complications: No notable events documented.

## 2022-04-30 NOTE — Discharge Instructions (Addendum)
I did not find a cause of the anemia on this exam. Small hiatal hernia seen - common finding and do not think it is a problem.   For completeness we will schedule a capsule endoscopy of the small intestine to look for bleeding sources beyond the area I checked today.  I appreciate the opportunity to care for you. Gatha Mayer, MD, FACG   YOU HAD AN ENDOSCOPIC PROCEDURE TODAY: Refer to the procedure report and other information in the discharge instructions given to you for any specific questions about what was found during the examination. If this information does not answer your questions, please call Dr. Celesta Aver office at [phone] to clarify.   YOU SHOULD EXPECT: Some feelings of bloating in the abdomen. Passage of more gas than usual. Walking can help get rid of the air that was put into your GI tract during the procedure and reduce the bloating. If you had a lower endoscopy (such as a colonoscopy or flexible sigmoidoscopy) you may notice spotting of blood in your stool or on the toilet paper. Some abdominal soreness may be present for a day or two, also.  DIET: Your first meal following the procedure should be a light meal and then it is ok to progress to your normal diet. A half-sandwich or bowl of soup is an example of a good first meal. Heavy or fried foods are harder to digest and may make you feel nauseous or bloated. Drink plenty of fluids but you should avoid alcoholic beverages for 24 hours.   ACTIVITY: Your care partner should take you home directly after the procedure. You should plan to take it easy, moving slowly for the rest of the day. You can resume normal activity the day after the procedure however YOU SHOULD NOT DRIVE, use power tools, machinery or perform tasks that involve climbing or major physical exertion for 24 hours (because of the sedation medicines used during the test).   SYMPTOMS TO REPORT IMMEDIATELY: A gastroenterologist can be reached at any hour. Please  call [phone]  for any of the following symptoms:  Following lower endoscopy (colonoscopy, flexible sigmoidoscopy) Excessive amounts of blood in the stool  Significant tenderness, worsening of abdominal pains  Swelling of the abdomen that is new, acute  Fever of 100 or higher  Following upper endoscopy (EGD, EUS, ERCP, esophageal dilation) Vomiting of blood or coffee ground material  New, significant abdominal pain  New, significant chest pain or pain under the shoulder blades  Painful or persistently difficult swallowing  New shortness of breath  Black, tarry-looking or red, bloody stools  FOLLOW UP:  If any biopsies were taken you will be contacted by phone or by letter within the next 1-3 weeks. Call [phone]  if you have not heard about the biopsies in 3 weeks.  Please also call with any specific questions about appointments or follow up tests.

## 2022-04-30 NOTE — Anesthesia Postprocedure Evaluation (Signed)
Anesthesia Post Note  Patient: [name] [name]  Procedure(s) Performed: ENTEROSCOPY SUBMUCOSAL TATTOO INJECTION     Patient location during evaluation: Endoscopy Anesthesia Type: MAC Level of consciousness: awake and alert Pain management: pain level controlled Vital Signs Assessment: post-procedure vital signs reviewed and stable Respiratory status: spontaneous breathing, nonlabored ventilation, respiratory function stable and patient connected to nasal cannula oxygen Cardiovascular status: blood pressure returned to baseline and stable Postop Assessment: no apparent nausea or vomiting Anesthetic complications: no   No notable events documented.  Last Vitals:  Vitals:   04/30/22 1110 04/30/22 1120  BP: 111/84 (!) 127/95  Pulse: 72 64  Resp: (!) 26 16  Temp:    SpO2: 97% 96%    Last Pain:  Vitals:   04/30/22 1120  TempSrc:   PainSc: 0-No pain                 Barnet Glasgow

## 2022-04-30 NOTE — H&P (Signed)
[facility] History and Physical   Primary Care Physician:  Isaac Bliss, Rayford Halsted, MD   Reason for Procedure:   Iron deficiency anemia and hx AVM's  Plan:    Small bowel endoscopy     HPI: [name] [name] is a 71 y.o. male w/ hx upper GI AVM's and recurrent iron deficiency anemia here for evaluation and treatment of suspected recurrent AVM's   Past Medical History:  Diagnosis Date   Anemia, iron deficiency 11/18/2008   Resolved, despite no rx--2012     ANEMIA-IRON DEFICIENCY    Arthritis    BACK PAIN, LUMBAR    Blood transfusion without reported diagnosis    CORONARY ARTERY DISEASE    DIVERTICULOSIS, COLON    Elevated LFTs 11/18/2015   GERD    Hepatitis    States he was tested for Hep B and he had antibodies   HYPERLIPIDEMIA    HYPERTENSION    dr tom wall   Nephrolithiasis    PONV (postoperative nausea and vomiting)    little nausea after having his hip replacement   Type 2 diabetes mellitus ([place])    Unspecified disorder of urethra and urinary tract     Past Surgical History:  Procedure Laterality Date   COLONOSCOPY     CORONARY ANGIOPLASTY WITH STENT PLACEMENT     06    dr t. wall   ELECTROCARDIOGRAM  03/30/2007   ESOPHAGOGASTRODUODENOSCOPY  11/07/2005   kidney stones     in office procedure to remove but no sedation surgeries for kidney stones    LUMBAR LAMINECTOMY Right 05/18/2014   Procedure: LUMBAR LAMINECTOMY Microdiscectomy Right Lumbar five - sacral one;  Surgeon: [name], MD;  Location: [facility];  Service: Orthopedics;  Laterality: Right;   Stress Cardiolite  11/30/2004   TOTAL HIP ARTHROPLASTY  10/28/2012   Procedure: TOTAL HIP ARTHROPLASTY;  Surgeon: [name], MD;  Location: [facility];  Service: Orthopedics;  Laterality: Right;   UPPER GASTROINTESTINAL ENDOSCOPY      Prior to Admission medications   Medication Sig Start Date End Date Taking? Authorizing Provider  Ascorbic Acid (VITAMIN C) 1000 MG tablet Take 2,000 mg by mouth  daily.   Yes [provider]  aspirin EC 81 MG tablet Take 1 tablet (81 mg total) by mouth daily. 11/20/18  Yes Isaac Bliss, Rayford Halsted, MD  atenolol (TENORMIN) 25 MG tablet Take 1 tablet (25 mg total) by mouth daily. 02/28/22  Yes Freada Bergeron, MD  b complex vitamins tablet Take 1 tablet by mouth daily.   Yes [provider]  Biotin 5000 MCG TABS Take 5,000 mcg by mouth daily.   Yes [provider]  Cholecalciferol (VITAMIN D) 2000 UNITS tablet Take 2,000 Units by mouth daily.   Yes [provider]  Coenzyme Q10 (CO Q 10) 100 MG CAPS Take 200 mg by mouth daily.    Yes [provider]  Cyanocobalamin (VITAMIN B-12 PO) Take 1 tablet by mouth daily.   Yes [provider]  Evolocumab (REPATHA SURECLICK) 440 MG/ML SOAJ Inject 1 pen into the skin every 14 (fourteen) days. 06/04/21  Yes Pemberton, Greer Ee, MD  FARXIGA 10 MG TABS tablet TAKE 1 TABLET BY MOUTH DAILY BEFORE BREAKFAST. 09/27/21  Yes [name], MD  Ginkgo Biloba (GINKOBA PO) Take 1 tablet by mouth daily.   Yes [provider]  icosapent Ethyl (VASCEPA) 1 g capsule Take 2 capsules (2 g total) by mouth 2 (two) times daily. 01/14/22  Yes Freada Bergeron, MD  lisinopril (ZESTRIL) 40 MG tablet Take 1 tablet (40 mg total) by mouth daily. 10/15/21  Yes Freada Bergeron, MD  Magnesium 400 MG CAPS Take 400 mg by mouth daily.    Yes [provider]  metFORMIN (GLUCOPHAGE-XR) 500 MG 24 hr tablet TAKE 2 TABLETS BY MOUTH EVERY DAY 02/28/22  Yes [name], MD  Multiple Vitamin (MULTIVITAMIN) tablet Take 1 tablet by mouth daily.   Yes [provider]  pantoprazole (PROTONIX) 40 MG tablet TAKE 1 TABLET BY MOUTH EVERY DAY 09/08/21  Yes Gatha Mayer, MD  rosuvastatin (CRESTOR) 5 MG tablet Take 0.5 tablets (2.5 mg total) by mouth daily. Patient taking differently: Take 5 mg by mouth 4 (four) times a week. 01/18/22  Yes Freada Bergeron, MD  RYBELSUS 14  MG TABS TAKE 1 TABLET BY MOUTH EVERY DAY 02/28/22  Yes [name], MD  testosterone (ANDROGEL) 50 MG/5GM (1%) GEL APPLY 50 MG (ONE PACKET) TOPICALLY DAILY. 01/14/22  Yes Isaac Bliss, Rayford Halsted, MD  Zinc 50 MG TABS Take 50 mg by mouth daily.   Yes [provider]  methocarbamol (ROBAXIN) 500 MG tablet Take 1 tablet (500 mg total) by mouth every 8 (eight) hours as needed for muscle spasms. 10/25/21   Lanae Crumbly, PA-C  nitroGLYCERIN (NITROSTAT) 0.4 MG SL tablet Place 1 tablet (0.4 mg total) under the tongue every 5 (five) minutes as needed for chest pain. 08/30/19   Dorothy Spark, MD  [facility] VERIO test strip USE AS DIRECTED 05/22/21   [name], MD    Current Facility-Administered Medications  Medication Dose Route Frequency Provider Last Rate Last Admin   0.9 %  sodium chloride infusion   Intravenous Continuous Gatha Mayer, MD       lactated ringers infusion   Intravenous Continuous Gatha Mayer, MD       lactated ringers infusion   Intravenous Continuous Gatha Mayer, MD 10 mL/hr at 04/30/22 1016 Continued from Pre-op at 04/30/22 1016   Facility-Administered Medications Ordered in Other Encounters  Medication Dose Route Frequency Provider Last Rate Last Admin   regadenoson (LEXISCAN) injection SOLN 0.4 mg  0.4 mg Intravenous Once Donato Heinz, MD        Allergies as of 03/19/2022 - Review Complete 03/18/2022  Allergen Reaction Noted   Actos [pioglitazone hydrochloride]  10/25/2011    Family History  Problem Relation Age of Onset   Colon cancer Mother        Colon Cancer-at advanced age- in her 48's per pt    Heart disease Father    Breast cancer Sister    Esophageal cancer Neg Hx    Rectal cancer Neg Hx    Stomach cancer Neg Hx     Social History   Socioeconomic History   Marital status: Single    Spouse name: Not on file   Number of children: 0   Years of education: Not on file   Highest education level: Not on file   Occupational History   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  Vaping Use  Vaping Use: Never used  Substance and Sexual Activity   Alcohol use: Yes    Alcohol/week: 0.0 standard drinks of alcohol    Comment: rare   Drug use: No   Sexual activity: Not Currently  Other Topics Concern   Not on file  Social History Narrative   Originally from [place], [place]   Lives alone with dog, has a couple of friends he can rely on for support.    Social Determinants of Health   Financial Resource Strain: Low Risk  (10/19/2020)   Overall Financial Resource Strain (CARDIA)    Difficulty of Paying Living Expenses: Not hard at all  Food Insecurity: No Food Insecurity (12/03/2018)   Hunger Vital Sign    Worried About Running Out of Food in the Last Year: Never true    Ran Out of Food in the Last Year: Never true  Transportation Needs: No Transportation Needs (10/19/2020)   PRAPARE - Hydrologist (Medical): No    Lack of Transportation (Non-Medical): No  Physical Activity: Inactive (12/03/2018)   Exercise Vital Sign    Days of Exercise per Week: 0 days    Minutes of Exercise per Session: 0 min  Stress: No Stress Concern Present (12/03/2018)   [place]    Feeling of Stress : Not at all  Social Connections: Socially Isolated (12/03/2018)   Social Connection and Isolation Panel [NHANES]    Frequency of Communication with Friends and Family: Once a week    Frequency of Social Gatherings with Friends and Family: Never    Attends Religious Services: Never    Marine scientist or Organizations: No    Attends Programme researcher, broadcasting/film/video: Never    Marital Status: Never married  Human resources officer Violence: Not on file    Review of Systems:  All other review of systems negative except as mentioned in the HPI.  Physical Exam: Vital signs BP (!) 141/90   Pulse 82   Temp (!) 97.4 F (36.3 C) (Temporal)   Resp 14   Ht 5' 7.5" (1.715 m)   Wt 89.8 kg   SpO2 96%   BMI 30.55 kg/m   General:   Alert,  Well-developed, well-nourished, pleasant and cooperative in NAD Lungs:  Clear throughout to auscultation.   Heart:  Regular rate and rhythm; no murmurs, clicks, rubs,  or gallops. Abdomen:  Soft, nontender and nondistended. Normal bowel sounds.   Neuro/Psych:  Alert and cooperative. Normal mood and affect. A and O x 3   @Kerline Trahan  Simonne Maffucci, MD, [facility] [phone] (pager) 04/30/2022 10:21 AM@

## 2022-04-30 NOTE — Anesthesia Procedure Notes (Signed)
Procedure Name: MAC Date/Time: 04/30/2022 10:32 AM  Performed by: Niel Hummer, CRNAPre-anesthesia Checklist: Patient identified, Emergency Drugs available, Suction available and Patient being monitored Oxygen Delivery Method: Simple face mask

## 2022-05-01 ENCOUNTER — Encounter (HOSPITAL_COMMUNITY): Payer: Self-pay | Admitting: Internal Medicine

## 2022-05-16 ENCOUNTER — Telehealth: Payer: Self-pay | Admitting: Surgery

## 2022-05-16 ENCOUNTER — Telehealth: Payer: Self-pay | Admitting: Internal Medicine

## 2022-05-16 ENCOUNTER — Encounter: Payer: Self-pay | Admitting: Surgery

## 2022-05-16 ENCOUNTER — Ambulatory Visit: Payer: Self-pay

## 2022-05-16 ENCOUNTER — Ambulatory Visit (INDEPENDENT_AMBULATORY_CARE_PROVIDER_SITE_OTHER): Payer: Medicare Other | Admitting: Surgery

## 2022-05-16 VITALS — Ht 67.5 in | Wt 198.0 lb

## 2022-05-16 DIAGNOSIS — M4726 Other spondylosis with radiculopathy, lumbar region: Secondary | ICD-10-CM

## 2022-05-16 DIAGNOSIS — D509 Iron deficiency anemia, unspecified: Secondary | ICD-10-CM

## 2022-05-16 DIAGNOSIS — M545 Low back pain, unspecified: Secondary | ICD-10-CM

## 2022-05-16 DIAGNOSIS — K5520 Angiodysplasia of colon without hemorrhage: Secondary | ICD-10-CM

## 2022-05-16 MED ORDER — KETOROLAC TROMETHAMINE 30 MG/ML IJ SOLN: 30.0000 mg | Freq: Once | INTRAMUSCULAR | Status: AC

## 2022-05-16 NOTE — Telephone Encounter (Signed)
Patient would like after summery visit 05/16/22 mailed to his home address

## 2022-05-16 NOTE — Telephone Encounter (Signed)
Per 04/30/22 procedure report - My office will schedule a capsule endoscopy of small bowel to look for cause of iron deficiency anemia (dx: iron deficiency anemia, angiodysplasia of GI tract) - may not be able to reach if we see more AVM's but other causes of bleeding possible also and want to exclude if so.  I returned call to patient. He has been scheduled for a capsule endoscopy on Thursday, 05/30/22 at 8:30 am. Pt asked if he could have a later appt. I informed him that we only schedule 8:30 am appts because he has to wear the recorder for 8 hours. Pt has been advised that he will have to return to the office at 4 pm the same day to return equipment. He knows that he can drive himself. Pt is aware that there is a Miralax prep that is required the evening prior to his appt. Pt would like instructions mailed to him. Pt advised to contact our office if he has not received his instructions by the week of his appt. Pt verbalized understanding of all information and had no concerns at the end of the call.   Ambulatory referral to GI in epic. VCE instructions sent to patient via MyChart and a copy has been mailed. VCE order sheet completed and placed in Dr. Celesta Aver office for his review and signature.

## 2022-05-17 NOTE — Telephone Encounter (Signed)
Note not completed. Please advise when ready so I can mail it to the pt.

## 2022-05-20 NOTE — Progress Notes (Signed)
Office Visit Note   Patient: [name] [name]           Date of Birth: 04-03-1951           MRN: [id number] Visit Date: 05/16/2022              Requested by: Isaac Bliss, Rayford Halsted, MD [place],  [place] [address] PCP: Isaac Bliss, Rayford Halsted, MD   Assessment & Plan: Visit Diagnoses:  1. Acute bilateral low back pain without sciatica   2. Other spondylosis with radiculopathy, lumbar region     Plan: Patient is ongoing low back pain and lower extremity radiculopathy that is failed conservative treatment I recommend getting lumbar MRI to rule out HNP/stenosis. This was discussed at his last office visit with me January for 2023.  Follow with Dr. Lorin Mercy in 3 weeks to discuss results and further treatment options. Follow-Up Instructions: Return in about 3 weeks (around 06/06/2022) for WITH DR YATES TO REVIEW LUMBAR MRI.   Orders:  Orders Placed This Encounter  Procedures   XR Lumbar Spine 2-3 Views   MR Lumbar Spine W Wo Contrast   Meds ordered this encounter  Medications   ketorolac (TORADOL) 30 MG/ML injection 30 mg      Procedures: No procedures performed   Clinical Data: No additional findings.   Subjective: Chief Complaint  Patient presents with   Lower Back - Pain    HPI 71 year old white male returns with complaints of chronic low back pain and bilateral lower extremity radiculopathy.  Last seen by me January 2023 and I advised that if he continues have ongoing symptoms that the neck step would be getting lumbar MRI.  States that left lower extremity radicular pain worse.  Aggravated with ambulation, bending, twisting. Review of Systems No current cardiopulmonary GI/GU issue  Objective: Vital Signs: Ht 5' 7.5" (1.715 m)   Wt 198 lb (89.8 kg)   BMI 30.55 kg/m   Physical Exam HENT:     Head: Normocephalic and atraumatic.  Eyes:     Extraocular Movements: Extraocular movements intact.  Pulmonary:     Effort: No respiratory  distress.  Musculoskeletal:     Comments: Negative logroll bilateral hips.  Positive left straight leg raise.  Negative on the right side.  No focal motor deficits.  Neurological:     Mental Status: He is alert.  Psychiatric:        Mood and Affect: Mood normal.     Ortho Exam  Specialty Comments:  No specialty comments available.  Imaging: No results found.   PMFS History: Patient Active Problem List   Diagnosis Date Noted   History of angiodysplasia of intestinal tract    Microcytic anemia 01/14/2022   Low testosterone 11/21/2017   S/P PTCA (percutaneous transluminal coronary angioplasty) 11/18/2015   Coronary artery disease due to lipid rich plaque 11/18/2015   Elevated LFTs 11/18/2015   Wellness examination 10/18/2015   Smoker 10/18/2015   HNP (herniated nucleus pulposus), lumbar 05/18/2014   Hypercalcemia 09/29/2013   NASH (nonalcoholic steatohepatitis) 08/28/2012   Screening for prostate cancer 08/28/2012   Hypogonadism male 08/28/2012   Testosterone deficiency in male 07/22/2011   Fatigue 07/05/2011   RBBB 10/12/2010   HEMORRHOIDS-INTERNAL 06/18/2010   ARTHRALGIA 01/18/2010   TOBACCO USER 07/11/2009   POSTURAL LIGHTHEADEDNESS 07/11/2009   DIVERTICULOSIS, COLON 11/18/2008   BACK PAIN, LUMBAR 09/01/2008   SWEATING 07/26/2008   Diabetes ([place]) 01/20/2008   COUGH 01/20/2008   Dyslipidemia 06/02/2007  Essential hypertension 06/02/2007   Coronary atherosclerosis 06/02/2007   GERD 06/02/2007   Past Medical History:  Diagnosis Date   Anemia, iron deficiency 11/18/2008   Resolved, despite no rx--2012     ANEMIA-IRON DEFICIENCY    Arthritis    BACK PAIN, LUMBAR    Blood transfusion without reported diagnosis    CORONARY ARTERY DISEASE    DIVERTICULOSIS, COLON    Elevated LFTs 11/18/2015   GERD    Hepatitis    States he was tested for Hep B and he had antibodies   HYPERLIPIDEMIA    HYPERTENSION    dr tom wall   Nephrolithiasis    PONV (postoperative  nausea and vomiting)    little nausea after having his hip replacement   Type 2 diabetes mellitus ([facility])    Unspecified disorder of urethra and urinary tract     Family History  Problem Relation Age of Onset   Colon cancer Mother        Colon Cancer-at advanced age- in her 74's per pt    Heart disease Father    Breast cancer Sister    Esophageal cancer Neg Hx    Rectal cancer Neg Hx    Stomach cancer Neg Hx     Past Surgical History:  Procedure Laterality Date   COLONOSCOPY     CORONARY ANGIOPLASTY WITH STENT PLACEMENT     06    dr t. wall   ELECTROCARDIOGRAM  03/30/2007   ENTEROSCOPY N/A 04/30/2022   Procedure: ENTEROSCOPY;  Surgeon: Gatha Mayer, MD;  Location: [facility];  Service: Gastroenterology;  Laterality: N/A;   ESOPHAGOGASTRODUODENOSCOPY  11/07/2005   kidney stones     in office procedure to remove but no sedation surgeries for kidney stones    LUMBAR LAMINECTOMY Right 05/18/2014   Procedure: LUMBAR LAMINECTOMY Microdiscectomy Right Lumbar five - sacral one;  Surgeon: [name], MD;  Location: [facility];  Service: Orthopedics;  Laterality: Right;   Stress Cardiolite  11/30/2004   SUBMUCOSAL TATTOO INJECTION  04/30/2022   Procedure: SUBMUCOSAL TATTOO INJECTION;  Surgeon: Gatha Mayer, MD;  Location: [facility];  Service: Gastroenterology;;   TOTAL HIP ARTHROPLASTY  10/28/2012   Procedure: TOTAL HIP ARTHROPLASTY;  Surgeon: [name], MD;  Location: [facility];  Service: Orthopedics;  Laterality: Right;   UPPER GASTROINTESTINAL ENDOSCOPY     Social History   Occupational History   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  Vaping Use   Vaping Use: Never used  Substance and  Sexual Activity   Alcohol use: Yes    Alcohol/week: 0.0 standard drinks of alcohol    Comment: rare   Drug use: No   Sexual activity: Not Currently

## 2022-05-22 ENCOUNTER — Other Ambulatory Visit: Payer: Self-pay | Admitting: Pharmacist

## 2022-05-22 MED ORDER — REPATHA SURECLICK 140 MG/ML ~~LOC~~ SOAJ: 1.0000 "pen " | SUBCUTANEOUS | 3 refills | Status: DC

## 2022-05-25 ENCOUNTER — Other Ambulatory Visit: Payer: Self-pay | Admitting: Internal Medicine

## 2022-05-25 DIAGNOSIS — R7989 Other specified abnormal findings of blood chemistry: Secondary | ICD-10-CM

## 2022-05-27 NOTE — Telephone Encounter (Signed)
Wait for Dr. Jerilee Hoh

## 2022-05-29 ENCOUNTER — Other Ambulatory Visit: Payer: Self-pay | Admitting: Internal Medicine

## 2022-05-29 ENCOUNTER — Ambulatory Visit
Admission: RE | Admit: 2022-05-29 | Discharge: 2022-05-29 | Disposition: A | Discharge status: Home / Self Care | Payer: Medicare Other | Source: Ambulatory Visit | Attending: Surgery | Admitting: Surgery

## 2022-05-29 DIAGNOSIS — M48061 Spinal stenosis, lumbar region without neurogenic claudication: Secondary | ICD-10-CM | POA: Diagnosis not present

## 2022-05-29 DIAGNOSIS — M4726 Other spondylosis with radiculopathy, lumbar region: Secondary | ICD-10-CM

## 2022-05-29 DIAGNOSIS — R7989 Other specified abnormal findings of blood chemistry: Secondary | ICD-10-CM

## 2022-05-29 DIAGNOSIS — M545 Low back pain, unspecified: Secondary | ICD-10-CM

## 2022-05-29 DIAGNOSIS — M5116 Intervertebral disc disorders with radiculopathy, lumbar region: Secondary | ICD-10-CM | POA: Diagnosis not present

## 2022-05-29 DIAGNOSIS — M4807 Spinal stenosis, lumbosacral region: Secondary | ICD-10-CM | POA: Diagnosis not present

## 2022-05-29 DIAGNOSIS — M5117 Intervertebral disc disorders with radiculopathy, lumbosacral region: Secondary | ICD-10-CM | POA: Diagnosis not present

## 2022-05-29 MED ORDER — GADOBENATE DIMEGLUMINE 529 MG/ML IV SOLN
18.0000 mL | Freq: Once | INTRAVENOUS | Status: AC | PRN
  Administered 2022-05-29: 18 mL via INTRAVENOUS

## 2022-05-29 NOTE — Telephone Encounter (Signed)
Pt requesting refill testosterone (ANDROGEL) 50 MG/5GM (1%) GEL

## 2022-06-03 ENCOUNTER — Encounter: Payer: Self-pay | Admitting: Internal Medicine

## 2022-06-03 ENCOUNTER — Ambulatory Visit (INDEPENDENT_AMBULATORY_CARE_PROVIDER_SITE_OTHER): Payer: Medicare Other | Admitting: Internal Medicine

## 2022-06-03 DIAGNOSIS — K5520 Angiodysplasia of colon without hemorrhage: Secondary | ICD-10-CM

## 2022-06-03 DIAGNOSIS — D509 Iron deficiency anemia, unspecified: Secondary | ICD-10-CM | POA: Diagnosis not present

## 2022-06-03 NOTE — Patient Instructions (Signed)
POST CAPSULE INSTRUCTIONS:  Contact our office immediately at [phone] if you suffer from any abdominal pain, nausea, or vomiting during capsule endoscopy. Do not eat or drink for at least 2 hours. After 2 hours you may have any of the following to drink: Water   White grape juice 7-Up   Chicken Bouillon Sprite   Ginger Ale After 4 hours you may have a light snack to include any of the following: A cup of soup   sandwich Bowl of cereal  Rice Toast   Eggs 2-3 small cookies (i.e. vanilla wafers or graham crackers) After 8 hours you may return to your regular diet. During your procedure do not go near anyone else that is having capsule endoscopy. Do not be in close contact with an MRI machine or a radio or television tower. Do not wear a heavy coat or sweater because your recorder may over heat and stop recording.   Do not disconnect the equipment or remove the belt at any time.  Since the Data Recorder is actually a small computer, it should be treated with utmost care and protection.  Avoid sudden movement and banging of the Data Recorder.  Do not do any heavy lifting or strenuous physical activity during the test especially if it involves sweating and do not bend over or stoop during capsule endoscopy. During capsule endoscopy, you will need to verify every 15 minutes that the small light on top of the Data Recorder is blinking twice per second.  If for some reason it stops blinking at this site, record the time and contact our office at [phone].

## 2022-06-03 NOTE — Progress Notes (Signed)
SN: [id number] Exp: [id number] LOT: [id number] Patient arrived for Capsule Endoscopy. Reported the prep went well. This nurse explained dietary restrictions for the next few hours. Patient verbalized understanding. Opened capsule, ensured capsule was flashing prior to the patient swallowing the capsule. Patient swallowed capsule without difficulty. Patient instructed to return to the office at 4:00 pm today for removal of the recording equipment, to call the office with any questions and if no capsule was visualized after 72 hours. No further questions by the conclusion of the visit.

## 2022-06-11 ENCOUNTER — Encounter: Payer: Self-pay | Admitting: Orthopaedic Surgery

## 2022-06-11 ENCOUNTER — Ambulatory Visit (INDEPENDENT_AMBULATORY_CARE_PROVIDER_SITE_OTHER): Payer: Medicare Other | Admitting: Orthopaedic Surgery

## 2022-06-11 VITALS — BP 125/85 | HR 81 | Ht 67.5 in | Wt 198.0 lb

## 2022-06-11 DIAGNOSIS — M5126 Other intervertebral disc displacement, lumbar region: Secondary | ICD-10-CM

## 2022-06-11 NOTE — Progress Notes (Unsigned)
Office Visit Note   Patient: [name] [name]           Date of Birth: 1951-06-04           MRN: [id number] Visit Date: 06/11/2022              Requested by: Isaac Bliss, Rayford Halsted, MD [place],  [place] [address] PCP: Isaac Bliss, Rayford Halsted, MD   Assessment & Plan: Visit Diagnoses: No diagnosis found.  Plan: ***  Follow-Up Instructions: No follow-ups on file.   Orders:  No orders of the defined types were placed in this encounter.  No orders of the defined types were placed in this encounter.     Procedures: No procedures performed   Clinical Data: No additional findings.   Subjective: Chief Complaint  Patient presents with   Lower Back - Pain, Follow-up    HPI  Review of Systems   Objective: Vital Signs: BP 125/85   Pulse 81   Ht 5' 7.5" (1.715 m)   Wt 198 lb (89.8 kg)   BMI 30.55 kg/m   Physical Exam  Ortho Exam  Specialty Comments:  No specialty comments available.  Imaging: No results found.   PMFS History: Patient Active Problem List   Diagnosis Date Noted   History of angiodysplasia of intestinal tract    Microcytic anemia 01/14/2022   Low testosterone 11/21/2017   S/P PTCA (percutaneous transluminal coronary angioplasty) 11/18/2015   Coronary artery disease due to lipid rich plaque 11/18/2015   Elevated LFTs 11/18/2015   Wellness examination 10/18/2015   Smoker 10/18/2015   HNP (herniated nucleus pulposus), lumbar 05/18/2014   Hypercalcemia 09/29/2013   NASH (nonalcoholic steatohepatitis) 08/28/2012   Screening for prostate cancer 08/28/2012   Hypogonadism male 08/28/2012   Testosterone deficiency in male 07/22/2011   Fatigue 07/05/2011   RBBB 10/12/2010   HEMORRHOIDS-INTERNAL 06/18/2010   ARTHRALGIA 01/18/2010   TOBACCO USER 07/11/2009   POSTURAL LIGHTHEADEDNESS 07/11/2009   DIVERTICULOSIS, COLON 11/18/2008   BACK PAIN, LUMBAR 09/01/2008   SWEATING 07/26/2008   Diabetes ([place]) 01/20/2008    COUGH 01/20/2008   Dyslipidemia 06/02/2007   Essential hypertension 06/02/2007   Coronary atherosclerosis 06/02/2007   GERD 06/02/2007   Past Medical History:  Diagnosis Date   Anemia, iron deficiency 11/18/2008   Resolved, despite no rx--2012     ANEMIA-IRON DEFICIENCY    Arthritis    BACK PAIN, LUMBAR    Blood transfusion without reported diagnosis    CORONARY ARTERY DISEASE    DIVERTICULOSIS, COLON    Elevated LFTs 11/18/2015   GERD    Hepatitis    States he was tested for Hep B and he had antibodies   HYPERLIPIDEMIA    HYPERTENSION    dr tom wall   Nephrolithiasis    PONV (postoperative nausea and vomiting)    little nausea after having his hip replacement   Type 2 diabetes mellitus ([facility])    Unspecified disorder of urethra and urinary tract     Family History  Problem Relation Age of Onset   Colon cancer Mother        Colon Cancer-at advanced age- in her 79's per pt    Heart disease Father    Breast cancer Sister    Esophageal cancer Neg Hx    Rectal cancer Neg Hx    Stomach cancer Neg Hx     Past Surgical History:  Procedure Laterality Date   COLONOSCOPY     CORONARY ANGIOPLASTY  WITH STENT PLACEMENT     06    dr t. wall   ELECTROCARDIOGRAM  03/30/2007   ENTEROSCOPY N/A 04/30/2022   Procedure: ENTEROSCOPY;  Surgeon: Gatha Mayer, MD;  Location: [facility];  Service: Gastroenterology;  Laterality: N/A;   ESOPHAGOGASTRODUODENOSCOPY  11/07/2005   kidney stones     in office procedure to remove but no sedation surgeries for kidney stones    LUMBAR LAMINECTOMY Right 05/18/2014   Procedure: LUMBAR LAMINECTOMY Microdiscectomy Right Lumbar five - sacral one;  Surgeon: [name], MD;  Location: [facility];  Service: Orthopedics;  Laterality: Right;   Stress Cardiolite  11/30/2004   SUBMUCOSAL TATTOO INJECTION  04/30/2022   Procedure: SUBMUCOSAL TATTOO INJECTION;  Surgeon: Gatha Mayer, MD;  Location: [facility];  Service: Gastroenterology;;   TOTAL HIP ARTHROPLASTY   10/28/2012   Procedure: TOTAL HIP ARTHROPLASTY;  Surgeon: [name], MD;  Location: [facility];  Service: Orthopedics;  Laterality: Right;   UPPER GASTROINTESTINAL ENDOSCOPY     Social History   Occupational History   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  Vaping Use   Vaping Use: Never used  Substance and Sexual Activity   Alcohol use: Yes    Alcohol/week: 0.0 standard drinks of alcohol    Comment: rare   Drug use: No   Sexual activity: Not Currently

## 2022-06-22 ENCOUNTER — Telehealth: Payer: Self-pay | Admitting: Internal Medicine

## 2022-06-22 NOTE — Telephone Encounter (Signed)
Please inform patient that capsule endoscopy test did not show any problems causing blood loss and low iron.  Nor further testing planned - remain on iron supplement and f/u [name] [name], [name] Halsted, MD for follow-up bloodwork as planned

## 2022-06-24 NOTE — Telephone Encounter (Signed)
Pt made aware of recent results and Dr. [name] recommendations: Pt verbalized understanding with all questions answered.

## 2022-06-26 ENCOUNTER — Encounter: Payer: Self-pay | Admitting: Internal Medicine

## 2022-06-26 DIAGNOSIS — D509 Iron deficiency anemia, unspecified: Secondary | ICD-10-CM

## 2022-06-26 DIAGNOSIS — R319 Hematuria, unspecified: Secondary | ICD-10-CM

## 2022-06-28 ENCOUNTER — Other Ambulatory Visit (INDEPENDENT_AMBULATORY_CARE_PROVIDER_SITE_OTHER): Payer: Medicare Other

## 2022-06-28 DIAGNOSIS — R319 Hematuria, unspecified: Secondary | ICD-10-CM

## 2022-06-28 DIAGNOSIS — D509 Iron deficiency anemia, unspecified: Secondary | ICD-10-CM | POA: Diagnosis not present

## 2022-06-28 LAB — URINALYSIS
Bilirubin Urine: NEGATIVE
Hgb urine dipstick: NEGATIVE
Ketones, ur: NEGATIVE
Leukocytes,Ua: NEGATIVE
Nitrite: NEGATIVE
Specific Gravity, Urine: 1.01 (ref 1.000–1.030)
Total Protein, Urine: NEGATIVE
Urine Glucose: >=1000 — AB
Urobilinogen, UA: 0.2 (ref 0.0–1.0)
pH: 7 (ref 5.0–8.0)

## 2022-07-19 ENCOUNTER — Ambulatory Visit (INDEPENDENT_AMBULATORY_CARE_PROVIDER_SITE_OTHER): Payer: Medicare Other

## 2022-07-19 DIAGNOSIS — Z23 Encounter for immunization: Secondary | ICD-10-CM | POA: Diagnosis not present

## 2022-07-31 ENCOUNTER — Encounter: Payer: Self-pay | Admitting: *Deleted

## 2022-07-31 NOTE — Progress Notes (Signed)
[facility] Quality Team Note  Name: [name] [name] Date of Birth: Feb 11, 1951 MRN: [id number] Date: 07/31/2022  [facility] Quality Team has reviewed this patient's chart, please see recommendations below:  [facility] Quality Other; (Called pt to offer sending out kit to have urine albumin creatinine ratio test done.  Pt declined and said he would come in to office sometime this week to see if he could have urine done.  )

## 2022-08-02 ENCOUNTER — Other Ambulatory Visit: Payer: Medicare Other

## 2022-08-02 ENCOUNTER — Other Ambulatory Visit: Payer: Self-pay

## 2022-08-02 DIAGNOSIS — E1159 Type 2 diabetes mellitus with other circulatory complications: Secondary | ICD-10-CM

## 2022-08-02 LAB — MICROALBUMIN / CREATININE URINE RATIO
Creatinine,U: 27.4 mg/dL
Microalb Creat Ratio: 2.6 mg/g (ref 0.0–30.0)
Microalb, Ur: <0.7 mg/dL (ref 0.0–1.9)

## 2022-09-05 ENCOUNTER — Other Ambulatory Visit: Payer: Self-pay | Admitting: Internal Medicine

## 2022-09-16 NOTE — Progress Notes (Unsigned)
Cardiology Office Note:    Date:  09/16/2022   ID:  [name] [name], DOB 11-06-1951, MRN [id number]  PCP:  Isaac Bliss, Rayford Halsted, MD   [facility] Providers Cardiologist:  Freada Bergeron, MD {   Referring MD: Isaac Bliss, [name]*    History of Present Illness:    [name] [name] is a 71 y.o. male with a hx of CAD s/p PCI following positive stress test in 2006, HTN, HLD, DMII and tobacco use who was previously followed by [name] who now returns to clinic for follow-up.  Was last seen in clinic on 03/2022 where he had been having a GIB in the setting of known AVMs. Plavix was stopped and he was planned for EGD/colo with GI.   Today, ***  Past Medical History:  Diagnosis Date   Anemia, iron deficiency 11/18/2008   Resolved, despite no rx--2012     ANEMIA-IRON DEFICIENCY    Arthritis    BACK PAIN, LUMBAR    Blood transfusion without reported diagnosis    CORONARY ARTERY DISEASE    DIVERTICULOSIS, COLON    Elevated LFTs 11/18/2015   GERD    Hepatitis    States he was tested for Hep B and he had antibodies   HYPERLIPIDEMIA    HYPERTENSION    dr tom wall   Nephrolithiasis    PONV (postoperative nausea and vomiting)    little nausea after having his hip replacement   Type 2 diabetes mellitus ([place])    Unspecified disorder of urethra and urinary tract     Past Surgical History:  Procedure Laterality Date   COLONOSCOPY     CORONARY ANGIOPLASTY WITH STENT PLACEMENT     06    dr t. wall   ELECTROCARDIOGRAM  03/30/2007   ENTEROSCOPY N/A 04/30/2022   Procedure: ENTEROSCOPY;  Surgeon: Gatha Mayer, MD;  Location: [facility];  Service: Gastroenterology;  Laterality: N/A;   ESOPHAGOGASTRODUODENOSCOPY  11/07/2005   kidney stones     in office procedure to remove but no sedation surgeries for kidney stones    LUMBAR LAMINECTOMY Right 05/18/2014   Procedure: LUMBAR LAMINECTOMY Microdiscectomy Right Lumbar five - sacral one;  Surgeon: [name], MD;   Location: [facility];  Service: Orthopedics;  Laterality: Right;   Stress Cardiolite  11/30/2004   SUBMUCOSAL TATTOO INJECTION  04/30/2022   Procedure: SUBMUCOSAL TATTOO INJECTION;  Surgeon: Gatha Mayer, MD;  Location: [facility];  Service: Gastroenterology;;   TOTAL HIP ARTHROPLASTY  10/28/2012   Procedure: TOTAL HIP ARTHROPLASTY;  Surgeon: [name], MD;  Location: [facility];  Service: Orthopedics;  Laterality: Right;   UPPER GASTROINTESTINAL ENDOSCOPY      Current Medications: No outpatient medications have been marked as taking for the 09/18/22 encounter (Appointment) with Freada Bergeron, MD.     Allergies:   Actos [pioglitazone hydrochloride]   Social History   Socioeconomic History   Marital status: Single    Spouse name: Not on file   Number of children: 0   Years of education: Not on file   Highest education level: Not on file  Occupational History   Occupation: Gaffer: [employer]    Comment: works Scientist, research (medical)   Occupation: Retired  Tobacco Use   Smoking status: Every Day    Packs/day: 1.00    Years: 48.00    Total pack years: 48.00    Types: Cigarettes   Smokeless tobacco: Never   Tobacco comments:  states he wants to reduce his dependence.  Vaping Use   Vaping Use: Never used  Substance and Sexual Activity   Alcohol use: Yes    Alcohol/week: 0.0 standard drinks of alcohol    Comment: rare   Drug use: No   Sexual activity: Not Currently  Other Topics Concern   Not on file  Social History Narrative   Originally from [place], [place]   Lives alone with dog, has a couple of friends he can rely on for support.    Social Determinants of Health   Financial Resource Strain: Low Risk  (10/19/2020)   Overall Financial Resource Strain (CARDIA)    Difficulty of Paying Living Expenses: Not hard at all  Food Insecurity: No Food Insecurity (12/03/2018)   Hunger Vital Sign    Worried About Running Out of Food in the Last Year: Never  true    Ran Out of Food in the Last Year: Never true  Transportation Needs: No Transportation Needs (10/19/2020)   PRAPARE - Hydrologist (Medical): No    Lack of Transportation (Non-Medical): No  Physical Activity: Inactive (12/03/2018)   Exercise Vital Sign    Days of Exercise per Week: 0 days    Minutes of Exercise per Session: 0 min  Stress: No Stress Concern Present (12/03/2018)   [place]    Feeling of Stress : Not at all  Social Connections: Socially Isolated (12/03/2018)   Social Connection and Isolation Panel [NHANES]    Frequency of Communication with Friends and Family: Once a week    Frequency of Social Gatherings with Friends and Family: Never    Attends Religious Services: Never    Marine scientist or Organizations: No    Attends Music therapist: Never    Marital Status: Never married     Family History: The patient's family history includes Breast cancer in his sister; Colon cancer in his mother; Heart disease in his father. There is no history of Esophageal cancer, Rectal cancer, or Stomach cancer.  ROS:   Please see the history of present illness.    Review of Systems  Constitutional:  Negative for chills and fever.  HENT:  Negative for hearing loss.   Eyes:  Negative for blurred vision and redness.  Respiratory:  Positive for cough.   Cardiovascular:  Negative for chest pain, palpitations, orthopnea, claudication, leg swelling and PND.  Gastrointestinal:  Negative for melena, nausea and vomiting.  Genitourinary:  Negative for dysuria and flank pain.  Musculoskeletal:  Positive for joint pain.  Neurological:  Negative for dizziness and loss of consciousness.  Endo/Heme/Allergies:  Negative for polydipsia.  Psychiatric/Behavioral:  Negative for memory loss.     EKGs/Labs/Other Studies Reviewed:    The following studies were reviewed today:  CT  Chest 01/29/2022: Cardiovascular: The heart size is normal. No substantial pericardial effusion. Coronary artery calcification is evident. Mild atherosclerotic calcification is noted in the wall of the thoracic aorta.   Mediastinum/Nodes: No mediastinal lymphadenopathy. No evidence for gross hilar lymphadenopathy although assessment is limited by the lack of intravenous contrast on the current study. The esophagus has normal imaging features. There is no axillary lymphadenopathy.   Lungs/Pleura: Centrilobular emphsyema noted. Previously identified tiny bilateral pulmonary nodules are stable in the interval. No new suspicious pulmonary nodule or mass. No focal airspace consolidation. No pleural effusion.   Upper Abdomen: Unremarkable.   Musculoskeletal: No worrisome lytic or sclerotic  osseous abnormality.   IMPRESSION: Lung-RADS 2, benign appearance or behavior. Continue annual screening with low-dose chest CT without contrast in 12 months.   Emphysema (ICD10-J43.9) and Aortic Atherosclerosis (ICD10-170.0)  Bilateral Carotid Duplex 09/21/2021: Summary:  Right Carotid: Velocities in the right ICA are consistent with a 1-39%  stenosis.   Left Carotid: Velocities in the left ICA are consistent with a 1-39%  stenosis.   Vertebrals:  Bilateral vertebral arteries demonstrate antegrade flow.  Subclavians: Normal flow hemodynamics were seen in bilateral subclavian               arteries.   Myoview 08/2019: Inconclusive ECG with significant motion artifact. Diffuse upsloping ST depressions, appear to become horizontal in V3 and V4 leads. Nuclear stress EF: 60%. The left ventricular ejection fraction is normal (55-65%). Defect 1: There is a medium fixed defect of mild severity present in the basal inferior, mid inferior and apical inferior location. Given normal wall motion in this region, consistent with artifact The study is normal. This is a low risk study.    EKG:   EKG is  personally reviewed. 03/18/2022: EKG was not ordered. 09/18/2021 Nicholes Rough, PA-C): NSR. Chronic, stable RBBB.   Recent Labs: 01/10/2022: ALT 33; BUN 12; Creatinine, Ser 0.83; Potassium 4.3; Sodium 142 03/18/2022: Hemoglobin 16.3; Platelets 226   Recent Lipid Panel    Component Value Date/Time   CHOL 77 01/10/2022 1348   CHOL 86 (L) 09/11/2020 1456   TRIG 114.0 01/10/2022 1348   HDL 56.70 01/10/2022 1348   HDL 57 09/11/2020 1456   CHOLHDL 1 01/10/2022 1348   VLDL 22.8 01/10/2022 1348   LDLCALC -3 (L) 01/10/2022 1348   LDLCALC 11 09/11/2020 1456   LDLDIRECT 118 (H) 08/23/2019 1552   LDLDIRECT 125.0 12/16/2018 1246     Physical Exam:    VS:  There were no vitals taken for this visit.    Wt Readings from Last 3 Encounters:  06/11/22 198 lb (89.8 kg)  05/16/22 198 lb (89.8 kg)  04/30/22 198 lb (89.8 kg)     GEN:  Well nourished, well developed in no acute distress HEENT: Normal NECK: No JVD; No carotid bruits CARDIAC: RRR, no murmurs, rubs, gallops RESPIRATORY:  Trace expiratory wheezing ABDOMEN: Soft, non-tender, non-distended MUSCULOSKELETAL:  No edema; No deformity  SKIN: Warm and dry NEUROLOGIC:  Alert and oriented x 3 PSYCHIATRIC:  Normal affect   ASSESSMENT:    No diagnosis found.   PLAN:    In order of problems listed above:   #CAD s/p remote PCI in 2006: Unknown artery intervened upon. Stress test negative in 2020 with no ischemia or scar, LVEF 60-65%. Doing well with no anginal symptoms. Tolerating all medications as prescribed. -Continue vascepa, repatha -Continue ASA 50m daily -Continue atenolol 241mdaily -Continue lisinopril 40108maily  #HTN: Well controlled and at goal <130/90s. -Continue atenolol 55m64mily -Continue lisinopril 40mg41mly  #HLD: #Statin intolerance: Very well controlled. Followed by lipid clinic.  -Continue repatha, low dose crestor 2.5mg d54my, and vascepa  #DMII: -Continue metformin and farxiga  #Tobacco use: -Not  interested in quitting -Annual CT scans  #GI AVMs: #Iron Deficiency Anemia: Followed by GI. Hemoglobin improved.   Follow-up:  6 months.  Medication Adjustments/Labs and Tests Ordered: Current medicines are reviewed at length with the patient today.  Concerns regarding medicines are outlined above.   No orders of the defined types were placed in this encounter.  No orders of the defined types were placed in this encounter.  There are no Patient Instructions on file for this visit.   Signed, Freada Bergeron, MD  09/16/2022 8:37 PM    [place]

## 2022-09-18 ENCOUNTER — Encounter: Payer: Self-pay | Admitting: Cardiology

## 2022-09-18 ENCOUNTER — Ambulatory Visit: Payer: Medicare Other | Attending: Cardiology | Admitting: Cardiology

## 2022-09-18 VITALS — BP 134/82 | HR 73 | Ht 67.0 in | Wt 193.8 lb

## 2022-09-18 DIAGNOSIS — F17200 Nicotine dependence, unspecified, uncomplicated: Secondary | ICD-10-CM | POA: Diagnosis not present

## 2022-09-18 DIAGNOSIS — Z9861 Coronary angioplasty status: Secondary | ICD-10-CM

## 2022-09-18 DIAGNOSIS — I10 Essential (primary) hypertension: Secondary | ICD-10-CM

## 2022-09-18 DIAGNOSIS — E119 Type 2 diabetes mellitus without complications: Secondary | ICD-10-CM | POA: Diagnosis not present

## 2022-09-18 DIAGNOSIS — Z789 Other specified health status: Secondary | ICD-10-CM

## 2022-09-18 DIAGNOSIS — I2510 Atherosclerotic heart disease of native coronary artery without angina pectoris: Secondary | ICD-10-CM

## 2022-09-18 DIAGNOSIS — D649 Anemia, unspecified: Secondary | ICD-10-CM | POA: Diagnosis not present

## 2022-09-18 DIAGNOSIS — E782 Mixed hyperlipidemia: Secondary | ICD-10-CM | POA: Diagnosis not present

## 2022-09-18 DIAGNOSIS — I2583 Coronary atherosclerosis due to lipid rich plaque: Secondary | ICD-10-CM

## 2022-09-18 LAB — CBC WITH DIFFERENTIAL/PLATELET
Basophils Absolute: 0.1 10*3/uL (ref 0.0–0.2)
Basos: 1 %
EOS (ABSOLUTE): 0.2 10*3/uL (ref 0.0–0.4)
Eos: 2 %
Hematocrit: 52.9 % — ABNORMAL HIGH (ref 37.5–51.0)
Hemoglobin: 18.1 g/dL — ABNORMAL HIGH (ref 13.0–17.7)
Lymphocytes Absolute: 2.3 10*3/uL (ref 0.7–3.1)
Lymphs: 26 %
MCH: 33 pg (ref 26.6–33.0)
MCHC: 34.2 g/dL (ref 31.5–35.7)
MCV: 97 fL (ref 79–97)
Monocytes Absolute: 0.8 10*3/uL (ref 0.1–0.9)
Monocytes: 9 %
Neutrophils Absolute: 5.5 10*3/uL (ref 1.4–7.0)
Neutrophils: 62 %
Platelets: 207 10*3/uL (ref 150–450)
RBC: 5.48 x10E6/uL (ref 4.14–5.80)
RDW: 14.9 % (ref 11.6–15.4)
WBC: 8.8 10*3/uL (ref 3.4–10.8)

## 2022-09-18 NOTE — Progress Notes (Signed)
Cardiology Office Note:    Date:  09/18/2022   ID:  [name] [name], DOB Oct 27, 1951, MRN [id number]  PCP:  Isaac Bliss, Rayford Halsted, MD   [facility] Providers Cardiologist:  Freada Bergeron, MD {  Referring MD: Isaac Bliss, [name]*    History of Present Illness:    [name] [name] is a 71 y.o. male with a hx of CAD s/p PCI following positive stress test in 2006, HTN, HLD, DMII and tobacco use who was previously followed by [name] who now returns to clinic for follow-up.  Was last seen in clinic on 03/2022 where he had been having a GIB in the setting of known AVMs. Plavix was stopped and he was planned for EGD/colo with GI.   Today, the patient states that he has had a few episodes of chest pain lasting 5-10 minutes before resolving spontaneously. He describes the pain as sharp and pulsating, always present across a localized area of his left chest. His chest pain has occurred while watching TV, but not with exertion. He denies any associated shortness of breath, racing heart rates, or palpitations. However, he does notice a pulsating sensation in his LLE sometimes.  Currently he is taking 2.5 mg rosuvastatin 3 times a week. He is also compliant with Repatha and fish oil.   He denies any peripheral edema, lightheadedness, headaches, syncope, orthopnea, or PND.   Past Medical History:  Diagnosis Date   Anemia, iron deficiency 11/18/2008   Resolved, despite no rx--2012     ANEMIA-IRON DEFICIENCY    Arthritis    BACK PAIN, LUMBAR    Blood transfusion without reported diagnosis    CORONARY ARTERY DISEASE    DIVERTICULOSIS, COLON    Elevated LFTs 11/18/2015   GERD    Hepatitis    States he was tested for Hep B and he had antibodies   HYPERLIPIDEMIA    HYPERTENSION    dr tom wall   Nephrolithiasis    PONV (postoperative nausea and vomiting)    little nausea after having his hip replacement   Type 2 diabetes mellitus ([place])    Unspecified disorder of urethra  and urinary tract     Past Surgical History:  Procedure Laterality Date   COLONOSCOPY     CORONARY ANGIOPLASTY WITH STENT PLACEMENT     06    dr t. wall   ELECTROCARDIOGRAM  03/30/2007   ENTEROSCOPY N/A 04/30/2022   Procedure: ENTEROSCOPY;  Surgeon: Gatha Mayer, MD;  Location: [facility];  Service: Gastroenterology;  Laterality: N/A;   ESOPHAGOGASTRODUODENOSCOPY  11/07/2005   kidney stones     in office procedure to remove but no sedation surgeries for kidney stones    LUMBAR LAMINECTOMY Right 05/18/2014   Procedure: LUMBAR LAMINECTOMY Microdiscectomy Right Lumbar five - sacral one;  Surgeon: [name], MD;  Location: [facility];  Service: Orthopedics;  Laterality: Right;   Stress Cardiolite  11/30/2004   SUBMUCOSAL TATTOO INJECTION  04/30/2022   Procedure: SUBMUCOSAL TATTOO INJECTION;  Surgeon: Gatha Mayer, MD;  Location: [facility];  Service: Gastroenterology;;   TOTAL HIP ARTHROPLASTY  10/28/2012   Procedure: TOTAL HIP ARTHROPLASTY;  Surgeon: [name], MD;  Location: [place];  Service: Orthopedics;  Laterality: Right;   UPPER GASTROINTESTINAL ENDOSCOPY      Current Medications: Current Meds  Medication Sig   Ascorbic Acid (VITAMIN C) 1000 MG tablet Take 2,000 mg by mouth daily.   aspirin EC 81 MG tablet Take 1 tablet (81  mg total) by mouth daily.   atenolol (TENORMIN) 25 MG tablet Take 1 tablet (25 mg total) by mouth daily.   b complex vitamins tablet Take 1 tablet by mouth daily.   Biotin 5000 MCG TABS Take 5,000 mcg by mouth daily.   Cholecalciferol (VITAMIN D) 2000 UNITS tablet Take 2,000 Units by mouth daily.   Coenzyme Q10 (CO Q 10) 100 MG CAPS Take 200 mg by mouth daily.    Cyanocobalamin (VITAMIN B-12 PO) Take 1 tablet by mouth daily.   Evolocumab (REPATHA SURECLICK) 559 MG/ML SOAJ Inject 1 pen  into the skin every 14 (fourteen) days.   FARXIGA 10 MG TABS tablet TAKE 1 TABLET BY MOUTH DAILY BEFORE BREAKFAST.   Ginkgo Biloba (GINKOBA PO) Take 1 tablet by mouth  daily.   icosapent Ethyl (VASCEPA) 1 g capsule Take 2 capsules (2 g total) by mouth 2 (two) times daily.   lisinopril (ZESTRIL) 40 MG tablet Take 1 tablet (40 mg total) by mouth daily.   Magnesium 400 MG CAPS Take 400 mg by mouth daily.    metFORMIN (GLUCOPHAGE-XR) 500 MG 24 hr tablet TAKE 2 TABLETS BY MOUTH EVERY DAY   methocarbamol (ROBAXIN) 500 MG tablet Take 1 tablet (500 mg total) by mouth every 8 (eight) hours as needed for muscle spasms.   Multiple Vitamin (MULTIVITAMIN) tablet Take 1 tablet by mouth daily.   nitroGLYCERIN (NITROSTAT) 0.4 MG SL tablet Place 1 tablet (0.4 mg total) under the tongue every 5 (five) minutes as needed for chest pain.   ONETOUCH VERIO test strip USE AS DIRECTED   pantoprazole (PROTONIX) 40 MG tablet TAKE 1 TABLET BY MOUTH EVERY DAY   rosuvastatin (CRESTOR) 5 MG tablet Take 0.5 tablets (2.5 mg total) by mouth daily.   RYBELSUS 14 MG TABS TAKE 1 TABLET BY MOUTH EVERY DAY   testosterone (ANDROGEL) 50 MG/5GM (1%) GEL APPLY 50 MG (ONE PACKET) TOPICALLY DAILY.   Zinc 50 MG TABS Take 50 mg by mouth daily.     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  Vaping Use   Vaping Use: Never used  Substance and Sexual Activity   Alcohol use: Yes    Alcohol/week: 0.0 standard drinks of alcohol    Comment: rare   Drug use: No   Sexual activity: Not Currently  Other Topics Concern   Not on file  Social History  Narrative   Originally from [place], [place]   Lives alone with dog, has a couple of friends he can rely on for support.    Social Determinants of Health   Financial Resource Strain: Low Risk  (10/19/2020)   Overall Financial Resource Strain (CARDIA)    Difficulty of Paying Living Expenses: Not hard at all  Food Insecurity: No Food Insecurity (12/03/2018)   Hunger Vital Sign  Worried About Charity fundraiser in the Last Year: Never true    [place] in the Last Year: Never true  Transportation Needs: No Transportation Needs (10/19/2020)   PRAPARE - Hydrologist (Medical): No    Lack of Transportation (Non-Medical): No  Physical Activity: Inactive (12/03/2018)   Exercise Vital Sign    Days of Exercise per Week: 0 days    Minutes of Exercise per Session: 0 min  Stress: No Stress Concern Present (12/03/2018)   [place]    Feeling of Stress : Not at all  Social Connections: Socially Isolated (12/03/2018)   Social Connection and Isolation Panel [NHANES]    Frequency of Communication with Friends and Family: Once a week    Frequency of Social Gatherings with Friends and Family: Never    Attends Religious Services: Never    Marine scientist or Organizations: No    Attends Music therapist: Never    Marital Status: Never married     Family History: The patient's family history includes Breast cancer in his sister; Colon cancer in his mother; Heart disease in his father. There is no history of Esophageal cancer, Rectal cancer, or Stomach cancer.  ROS:   Please see the history of present illness.    Review of Systems  Constitutional:  Negative for chills and fever.  HENT:  Negative for hearing loss.   Eyes:  Negative for blurred vision and redness.  Respiratory:  Positive for cough.   Cardiovascular:  Positive for chest pain. Negative for palpitations, orthopnea,  claudication, leg swelling and PND.  Gastrointestinal:  Negative for melena, nausea and vomiting.  Genitourinary:  Negative for dysuria and flank pain.  Musculoskeletal:  Positive for joint pain.  Neurological:  Negative for dizziness and loss of consciousness.  Endo/Heme/Allergies:  Negative for polydipsia.  Psychiatric/Behavioral:  Negative for memory loss.     EKGs/Labs/Other Studies Reviewed:    The following studies were reviewed today:  CT Chest 01/29/2022: Cardiovascular: The heart size is normal. No substantial pericardial effusion. Coronary artery calcification is evident. Mild atherosclerotic calcification is noted in the wall of the thoracic aorta.   Mediastinum/Nodes: No mediastinal lymphadenopathy. No evidence for gross hilar lymphadenopathy although assessment is limited by the lack of intravenous contrast on the current study. The esophagus has normal imaging features. There is no axillary lymphadenopathy.   Lungs/Pleura: Centrilobular emphsyema noted. Previously identified tiny bilateral pulmonary nodules are stable in the interval. No new suspicious pulmonary nodule or mass. No focal airspace consolidation. No pleural effusion.   Upper Abdomen: Unremarkable.   Musculoskeletal: No worrisome lytic or sclerotic osseous abnormality.   IMPRESSION: Lung-RADS 2, benign appearance or behavior. Continue annual screening with low-dose chest CT without contrast in 12 months.   Emphysema (ICD10-J43.9) and Aortic Atherosclerosis (ICD10-170.0)  Bilateral Carotid Duplex 09/21/2021: Summary:  Right Carotid: Velocities in the right ICA are consistent with a 1-39%  stenosis.   Left Carotid: Velocities in the left ICA are consistent with a 1-39%  stenosis.   Vertebrals:  Bilateral vertebral arteries demonstrate antegrade flow.  Subclavians: Normal flow hemodynamics were seen in bilateral subclavian               arteries.   Myoview 08/2019: Inconclusive ECG with  significant motion artifact. Diffuse upsloping ST depressions, appear to become horizontal in V3 and V4 leads. Nuclear stress EF: 60%. The left ventricular ejection fraction is  normal (55-65%). Defect 1: There is a medium fixed defect of mild severity present in the basal inferior, mid inferior and apical inferior location. Given normal wall motion in this region, consistent with artifact The study is normal. This is a low risk study.    EKG:   EKG is personally reviewed. 09/18/2022:  Sinus rhythm. RBBB. Rate 72 bpm. 03/18/2022: EKG was not ordered. 09/18/2021 Nicholes Rough, PA-C): NSR. Chronic, stable RBBB.   Recent Labs: 01/10/2022: ALT 33; BUN 12; Creatinine, Ser 0.83; Potassium 4.3; Sodium 142 03/18/2022: Hemoglobin 16.3; Platelets 226   Recent Lipid Panel    Component Value Date/Time   CHOL 77 01/10/2022 1348   CHOL 86 (L) 09/11/2020 1456   TRIG 114.0 01/10/2022 1348   HDL 56.70 01/10/2022 1348   HDL 57 09/11/2020 1456   CHOLHDL 1 01/10/2022 1348   VLDL 22.8 01/10/2022 1348   LDLCALC -3 (L) 01/10/2022 1348   LDLCALC 11 09/11/2020 1456   LDLDIRECT 118 (H) 08/23/2019 1552   LDLDIRECT 125.0 12/16/2018 1246     Physical Exam:    VS:  BP 134/82   Pulse 73   Ht 5' 7"  (1.702 m)   Wt 193 lb 12.8 oz (87.9 kg)   SpO2 93%   BMI 30.35 kg/m     Wt Readings from Last 3 Encounters:  09/18/22 193 lb 12.8 oz (87.9 kg)  06/11/22 198 lb (89.8 kg)  05/16/22 198 lb (89.8 kg)     GEN:  Well nourished, well developed in no acute distress HEENT: Normal NECK: No JVD; No carotid bruits CARDIAC: RRR, no murmurs, rubs, gallops RESPIRATORY:  CTAB, good air movement ABDOMEN: Soft, non-tender, non-distended MUSCULOSKELETAL:  No edema; No deformity  SKIN: Warm and dry NEUROLOGIC:  Alert and oriented x 3 PSYCHIATRIC:  Normal affect   ASSESSMENT:    1. Coronary artery disease due to lipid rich plaque   2. Coronary artery disease involving native coronary artery of native heart without  angina pectoris   3. Anemia, unspecified type   4. TOBACCO USER   5. Statin intolerance   6. S/P PTCA (percutaneous transluminal coronary angioplasty)   7. Essential hypertension   8. Mixed hyperlipidemia   9. Atherosclerosis of native coronary artery of native heart without angina pectoris   10. Diabetes mellitus with coincident hypertension ([place])      PLAN:    In order of problems listed above:   #CAD s/p remote PCI in 2006: Unknown artery intervened upon. Stress test negative in 2020 with no ischemia or scar, LVEF 60-65%. Doing well with no anginal symptoms. Tolerating all medications as prescribed. -Check TTE for monitoring -Continue vascepa, repatha -Continue ASA 79m daily -Continue atenolol 268mdaily -Continue lisinopril 4050maily  #HTN: Well controlled and at goal <130/90s. -Continue atenolol 23m88mily -Continue lisinopril 40mg6mly  #HLD: #Statin intolerance: Very well controlled. Followed by lipid clinic.  -Continue repatha, low dose crestor 2.5mg 369meek, and vascepa  #DMII: -Continue metformin and farxiga  #Tobacco use: -Not interested in quitting -Annual CT scans  #GI AVMs: #Iron Deficiency Anemia: Followed by GI. Hemoglobin improved.   Follow-up:  6 months.  Medication Adjustments/Labs and Tests Ordered: Current medicines are reviewed at length with the patient today.  Concerns regarding medicines are outlined above.   Orders Placed This Encounter  Procedures   CBC w/Diff   EKG 12-Lead   ECHOCARDIOGRAM COMPLETE   No orders of the defined types were placed in this encounter.  Patient Instructions  Medication Instructions:  Your physician recommends that you continue on your current medications as directed. Please refer to the Current Medication list given to you today.  *If you need a refill on your cardiac medications before your next appointment, please call your pharmacy*   Lab Work:  [employer] W DIFF  If you have labs (blood  work) drawn today and your tests are completely normal, you will receive your results only by: [place] (if you have MyChart) OR A paper copy in the mail If you have any lab test that is abnormal or we need to change your treatment, we will call you to review the results.   Testing/Procedures:  Your physician has requested that you have an echocardiogram. Echocardiography is a painless test that uses sound waves to create images of your heart. It provides your doctor with information about the size and shape of your heart and how well your heart's chambers and valves are working. This procedure takes approximately one hour. There are no restrictions for this procedure. Please do NOT wear cologne, perfume, aftershave, or lotions (deodorant is allowed). Please arrive 15 minutes prior to your appointment time.    Follow-Up: At [facility], you and your health needs are our priority.  As part of our continuing mission to provide you with exceptional heart care, we have created designated Provider Care Teams.  These Care Teams include your primary Cardiologist (physician) and Advanced Practice Providers (APPs -  Physician Assistants and Nurse Practitioners) who all work together to provide you with the care you need, when you need it.  We recommend signing up for the patient portal called "MyChart".  Sign up information is provided on this After Visit Summary.  MyChart is used to connect with patients for Virtual Visits (Telemedicine).  Patients are able to view lab/test results, encounter notes, upcoming appointments, etc.  Non-urgent messages can be sent to your provider as well.   To learn more about what you can do with MyChart, go to [email or web address].    Your next appointment:   6 month(s)  The format for your next appointment:   In Person  Provider:   Freada Bergeron, MD      Important Information About Sugar        I,Mathew Stumpf,acting as a scribe  for Freada Bergeron, MD.,have documented all relevant documentation on the behalf of Freada Bergeron, MD,as directed by  Freada Bergeron, MD while in the presence of Freada Bergeron, MD.  I, Freada Bergeron, MD, have reviewed all documentation for this visit. The documentation on 09/18/22 for the exam, diagnosis, procedures, and orders are all accurate and complete.   Signed, Freada Bergeron, MD  09/18/2022 2:59 PM    [place]

## 2022-09-18 NOTE — Patient Instructions (Signed)
Medication Instructions:   Your physician recommends that you continue on your current medications as directed. Please refer to the Current Medication list given to you today.  *If you need a refill on your cardiac medications before your next appointment, please call your pharmacy*   Lab Work:  [employer] W DIFF  If you have labs (blood work) drawn today and your tests are completely normal, you will receive your results only by: [place] (if you have MyChart) OR A paper copy in the mail If you have any lab test that is abnormal or we need to change your treatment, we will call you to review the results.   Testing/Procedures:  Your physician has requested that you have an echocardiogram. Echocardiography is a painless test that uses sound waves to create images of your heart. It provides your doctor with information about the size and shape of your heart and how well your heart's chambers and valves are working. This procedure takes approximately one hour. There are no restrictions for this procedure. Please do NOT wear cologne, perfume, aftershave, or lotions (deodorant is allowed). Please arrive 15 minutes prior to your appointment time.    Follow-Up: At [facility], you and your health needs are our priority.  As part of our continuing mission to provide you with exceptional heart care, we have created designated Provider Care Teams.  These Care Teams include your primary Cardiologist (physician) and Advanced Practice Providers (APPs -  Physician Assistants and Nurse Practitioners) who all work together to provide you with the care you need, when you need it.  We recommend signing up for the patient portal called "MyChart".  Sign up information is provided on this After Visit Summary.  MyChart is used to connect with patients for Virtual Visits (Telemedicine).  Patients are able to view lab/test results, encounter notes, upcoming appointments, etc.  Non-urgent  messages can be sent to your provider as well.   To learn more about what you can do with MyChart, go to [email or web address].    Your next appointment:   6 month(s)  The format for your next appointment:   In Person  Provider:   Freada Bergeron, MD      Important Information About Sugar

## 2022-10-07 ENCOUNTER — Telehealth: Payer: Self-pay | Admitting: Internal Medicine

## 2022-10-07 MED ORDER — DAPAGLIFLOZIN PROPANEDIOL 10 MG PO TABS: 10.0000 mg | ORAL_TABLET | Freq: Every day | ORAL | 1 refills | Status: DC

## 2022-10-07 NOTE — Telephone Encounter (Signed)
MEDICATION: Farxiga  PHARMACY:  [facility] on [address] of [address]   HAS THE PATIENT CONTACTED [place]?  yes  IS THIS A 90 DAY SUPPLY : YES  IS PATIENT OUT OF MEDICATION: YES  IF NOT; HOW MUCH IS LEFT:   LAST APPOINTMENT DATE: @6 /11/2021  NEXT APPOINTMENT DATE:@12 /11/2021  DO WE HAVE YOUR PERMISSION TO LEAVE A DETAILED MESSAGE?:  OTHER COMMENTS:    **Let patient know to contact pharmacy at the end of the day to make sure medication is ready. **  ** Please notify patient to allow 48-72 hours to process**  **Encourage patient to contact the pharmacy for refills or they can request refills through [facility]**

## 2022-10-07 NOTE — Telephone Encounter (Signed)
done

## 2022-10-07 NOTE — Addendum Note (Signed)
Addended by: Jefferson Fuel on: 10/07/2022 09:47 AM   Modules accepted: Orders

## 2022-10-08 ENCOUNTER — Ambulatory Visit (HOSPITAL_COMMUNITY): Payer: Medicare Other | Attending: Cardiology

## 2022-10-08 ENCOUNTER — Telehealth: Payer: Self-pay | Admitting: Cardiology

## 2022-10-08 ENCOUNTER — Other Ambulatory Visit: Payer: Self-pay

## 2022-10-08 DIAGNOSIS — E785 Hyperlipidemia, unspecified: Secondary | ICD-10-CM

## 2022-10-08 DIAGNOSIS — I2583 Coronary atherosclerosis due to lipid rich plaque: Secondary | ICD-10-CM

## 2022-10-08 DIAGNOSIS — I2510 Atherosclerotic heart disease of native coronary artery without angina pectoris: Secondary | ICD-10-CM

## 2022-10-08 DIAGNOSIS — I10 Essential (primary) hypertension: Secondary | ICD-10-CM

## 2022-10-08 MED ORDER — LISINOPRIL 40 MG PO TABS: 40.0000 mg | ORAL_TABLET | Freq: Every day | ORAL | 3 refills | Status: DC

## 2022-10-08 MED ORDER — PERFLUTREN LIPID MICROSPHERE
1.0000 mL | INTRAVENOUS | Status: AC | PRN
  Administered 2022-10-08: 2 mL via INTRAVENOUS

## 2022-10-08 NOTE — Telephone Encounter (Signed)
STAT if patient feels like he/she is going to faint   Are you dizzy now? No   Do you feel faint or have you passed out? No   Do you have any other symptoms? Pt states his balance is off when he gets up and when he was putting something up in the cabinet he got lightheaded.   Have you checked your HR and BP (record if available)? Bp: 140/90

## 2022-10-08 NOTE — Telephone Encounter (Signed)
Pt is calling to let Dr. Johney Frame know that a week ago, he received a dental implant.   He was prescribed antibiotic for this and took his last dose today.  Pt states since his implant, he has been experiencing complaints of dizziness and feeling off balance, with position changes.  He states this mostly occurs when he sits up in the bed and with going from a seated to a standing position from the bed.  He states when he stands up he has to stand for a minute or so, before proceeding with motion, or else he gets dizzy, the room spins, and he feels off balance.   He states his pressures have been his normal when he monitors them periodically, at [id number] systolic and 96-88A diastolic.  He is unable to monitor during position changes.   He is asking for advice for this or should he reach out to his PCP for complaints.   Pt will have his scheduled echo with our office today and will come to that appt.   Informed the pt that it seems like he may be dealing with some orthostasis issues or possibly vertigo. Also he may be experiencing side effects from his antibiotic, and he will no more if that is related, after taking his last dose today and symptoms resolve with that.  Advised him to hydrate well, eat several small meals daily, start wearing compressions during the day, and change positions slowly to promote good circulation.    Pt aware that I will still run this by Dr. Johney Frame for further advisement, but this is a good starting point in helping with his complaints.   He is aware we will also follow-up with him on his echo results, after he has testing complete today.   Pt verbalized understanding and agrees with this plan.

## 2022-10-09 ENCOUNTER — Telehealth: Payer: Self-pay | Admitting: *Deleted

## 2022-10-09 DIAGNOSIS — I10 Essential (primary) hypertension: Secondary | ICD-10-CM

## 2022-10-09 DIAGNOSIS — I2510 Atherosclerotic heart disease of native coronary artery without angina pectoris: Secondary | ICD-10-CM

## 2022-10-09 DIAGNOSIS — Z9861 Coronary angioplasty status: Secondary | ICD-10-CM

## 2022-10-09 DIAGNOSIS — I77819 Aortic ectasia, unspecified site: Secondary | ICD-10-CM

## 2022-10-09 LAB — ECHOCARDIOGRAM COMPLETE
Area-P 1/2: 5.84 cm2
S' Lateral: 2.7 cm

## 2022-10-09 NOTE — Telephone Encounter (Signed)
The patient has been notified of the result and verbalized understanding.  All questions (if any) were answered.  Pt aware that he will need a repeat echo in one year for surveillance of his aorta.   Pt aware that I will place the order for repeat echo in one year in the system and send a message to our [facility] Schedulers to call him back and arrange this appt closer to that time.   Pt aware to maintain good BP control, watch his salt intake, and continue his BP medications.    Pt verbalized understanding and agrees with this plan.   Off note:  Pt states his symptoms of dizziness and feeling off balance have very much improved since our conversation about this yesterday.  He states since being off the antibiotic, he is feeling much better and dizziness is almost gone.

## 2022-10-09 NOTE — Telephone Encounter (Signed)
  Off note:  Pt states his symptoms of dizziness and feeling off balance have very much improved since our conversation about this yesterday.  He states since being off the antibiotic, he is feeling much better and dizziness is almost gone.

## 2022-10-09 NOTE — Telephone Encounter (Signed)
-----   Message from Freada Bergeron, MD sent at 10/09/2022 12:38 PM EST ----- His echo shows normal pumping function. His heart muscle is moderately thickened which means we need to control his blood pressure. His aorta is also mildly enlarged which we will monitor with yearly echoes going forward.

## 2022-10-11 ENCOUNTER — Ambulatory Visit (INDEPENDENT_AMBULATORY_CARE_PROVIDER_SITE_OTHER): Payer: Medicare Other | Admitting: Internal Medicine

## 2022-10-11 ENCOUNTER — Encounter: Payer: Self-pay | Admitting: Internal Medicine

## 2022-10-11 VITALS — BP 120/82 | HR 75 | Ht 67.0 in | Wt 193.4 lb

## 2022-10-11 DIAGNOSIS — E1169 Type 2 diabetes mellitus with other specified complication: Secondary | ICD-10-CM

## 2022-10-11 DIAGNOSIS — E785 Hyperlipidemia, unspecified: Secondary | ICD-10-CM | POA: Diagnosis not present

## 2022-10-11 DIAGNOSIS — E1159 Type 2 diabetes mellitus with other circulatory complications: Secondary | ICD-10-CM

## 2022-10-11 LAB — POCT GLYCOSYLATED HEMOGLOBIN (HGB A1C): Hemoglobin A1C: 5.9 % — AB (ref 4.0–5.6)

## 2022-10-11 NOTE — Progress Notes (Signed)
Patient ID: [name] [name], male   DOB: December 24, 1950, 71 y.o.   MRN: [id number]  HPI: [name] [name] is a 71 y.o.-year-old male, returning for follow-up for DM2, dx in 2008, non-insulin-dependent, uncontrolled, with circulatory complications (CAD). Pt. previously saw Dr. Loanne Drilling, last visit 6 months ago.  Interim history: No increased urination, blurry vision, nausea, chest pain. He has no complaints at today's visit.  Reviewed HbA1c: Lab Results  Component Value Date   HGBA1C 5.8 (A) 04/11/2022   HGBA1C 7.1 (H) 01/10/2022   HGBA1C 6.8 (A) 09/26/2021   HGBA1C 6.8 (A) 03/22/2021   HGBA1C 7.1 (H) 01/09/2021   HGBA1C 6.2 (H) 09/11/2020   HGBA1C 6.7 (A) 05/16/2020   HGBA1C 7.2 (A) 02/15/2020   HGBA1C 7.4 (H) 12/23/2019   HGBA1C 6.7 (A) 08/10/2019   Pt is on a regimen of: - Metformin ER 1000 mg with dinner - Farxiga 10 mg before breakfast - Rybelsus 14 mg before breakfast He tried Actos but this caused weight gain.  Pt was checking his sugars 2x a month >> more frequently in last week before this appointment: - am: 144 >> n/c - 2h after b'fast: n/c - before lunch: n/c >> 143 - 2h after lunch: n/c >> 120 - before dinner: 91 >> 67-102 - 2h after dinner: n/c >> 99 - bedtime: n/c - nighttime: n/c Lowest sugar was 91 >> 67; he has hypoglycemia awareness at 70.  Highest sugar was 144 >> 143. He stays up until 2-3 am.   Glucometer: One Touch Verio  - no CKD, last BUN/creatinine:  Lab Results  Component Value Date   BUN 12 01/10/2022   BUN 19 01/09/2021   CREATININE 0.83 01/10/2022   CREATININE 0.86 01/09/2021  On lisinopril 40 mg daily.  - + HL; last set of lipids: Lab Results  Component Value Date   CHOL 77 01/10/2022   HDL 56.70 01/10/2022   LDLCALC -3 (L) 01/10/2022   LDLDIRECT 118 (H) 08/23/2019   TRIG 114.0 01/10/2022   CHOLHDL 1 01/10/2022  On Repatha, Vascepa 2 g twice a day, Crestor 2.5 mg daily (reduced recently), and co-Q10.  - last eye exam was in 09/2021:  No DR reportedly. Dr. Nicki Reaper. He had cataract sx. 11/2022. Goes in 11/2022.  - no numbness and tingling in his feet.  Last foot exam 01/10/2022.  He is on ASA 81.  He does have a history of NASH-on vitamin E, anemia, HTN, hypogonadism-on testosterone injections, history of nephrolithiasis, diverticulosis.  ROS: + see HPI  Past Medical History:  Diagnosis Date   Anemia, iron deficiency 11/18/2008   Resolved, despite no rx--2012     ANEMIA-IRON DEFICIENCY    Arthritis    BACK PAIN, LUMBAR    Blood transfusion without reported diagnosis    CORONARY ARTERY DISEASE    DIVERTICULOSIS, COLON    Elevated LFTs 11/18/2015   GERD    Hepatitis    States he was tested for Hep B and he had antibodies   HYPERLIPIDEMIA    HYPERTENSION    dr tom wall   Nephrolithiasis    PONV (postoperative nausea and vomiting)    little nausea after having his hip replacement   Type 2 diabetes mellitus ([place])    Unspecified disorder of urethra and urinary tract    Past Surgical History:  Procedure Laterality Date   COLONOSCOPY     CORONARY ANGIOPLASTY WITH STENT PLACEMENT     06    dr t. wall   ELECTROCARDIOGRAM  03/30/2007   ENTEROSCOPY N/A 04/30/2022   Procedure: ENTEROSCOPY;  Surgeon: Gatha Mayer, MD;  Location: [facility];  Service: Gastroenterology;  Laterality: N/A;   ESOPHAGOGASTRODUODENOSCOPY  11/07/2005   kidney stones     in office procedure to remove but no sedation surgeries for kidney stones    LUMBAR LAMINECTOMY Right 05/18/2014   Procedure: LUMBAR LAMINECTOMY Microdiscectomy Right Lumbar five - sacral one;  Surgeon: [name], MD;  Location: [facility];  Service: Orthopedics;  Laterality: Right;   Stress Cardiolite  11/30/2004   SUBMUCOSAL TATTOO INJECTION  04/30/2022   Procedure: SUBMUCOSAL TATTOO INJECTION;  Surgeon: Gatha Mayer, MD;  Location: [facility];  Service: Gastroenterology;;   TOTAL HIP ARTHROPLASTY  10/28/2012   Procedure: TOTAL HIP ARTHROPLASTY;  Surgeon: [name], MD;  Location: [facility];  Service: Orthopedics;  Laterality: Right;   UPPER GASTROINTESTINAL ENDOSCOPY     Social History   Socioeconomic History   Marital status: Single    Spouse name: Not on file   Number of children: 0   Years of education: Not on file   Highest education level: Not on file  Occupational History   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  Vaping Use   Vaping Use: Never used  Substance and Sexual Activity   Alcohol use: Yes    Alcohol/week: 0.0 standard drinks of alcohol    Comment: rare   Drug use: No   Sexual activity: Not Currently  Other Topics Concern   Not on file  Social History Narrative   Originally from [place], [place]   Lives alone with dog, has a couple of friends he can rely on for support.    Social Determinants of Health   Financial Resource Strain: Low Risk  (10/19/2020)   Overall Financial Resource Strain (CARDIA)    Difficulty of Paying Living Expenses: Not hard at all  Food Insecurity: No Food Insecurity (12/03/2018)   Hunger Vital Sign    Worried About Running Out of Food in the Last Year: Never true    Ran Out of Food in the Last Year: Never true  Transportation Needs: No Transportation Needs (10/19/2020)   PRAPARE - Hydrologist (Medical): No    Lack of Transportation (Non-Medical): No  Physical Activity: Inactive (12/03/2018)   Exercise Vital Sign    Days of Exercise per Week: 0 days    Minutes of Exercise per Session: 0 min  Stress: No Stress Concern Present (12/03/2018)   [place]    Feeling of Stress : Not at all  Social Connections: Socially  Isolated (12/03/2018)   Social Connection and Isolation Panel [NHANES]    Frequency of Communication with Friends and Family: Once a week    Frequency of Social Gatherings with Friends and Family: Never    Attends Religious Services: Never    Marine scientist or Organizations: No    Attends Archivist Meetings: Never    Marital Status: Never married  Intimate Partner Violence: Not on file   Current Outpatient Medications  on File Prior to Visit  Medication Sig Dispense Refill   Ascorbic Acid (VITAMIN C) 1000 MG tablet Take 2,000 mg by mouth daily.     aspirin EC 81 MG tablet Take 1 tablet (81 mg total) by mouth daily. 30 tablet 11   atenolol (TENORMIN) 25 MG tablet Take 1 tablet (25 mg total) by mouth daily. 90 tablet 1   b complex vitamins tablet Take 1 tablet by mouth daily.     Biotin 5000 MCG TABS Take 5,000 mcg by mouth daily.     Cholecalciferol (VITAMIN D) 2000 UNITS tablet Take 2,000 Units by mouth daily.     Coenzyme Q10 (CO Q 10) 100 MG CAPS Take 200 mg by mouth daily.      Cyanocobalamin (VITAMIN B-12 PO) Take 1 tablet by mouth daily.     dapagliflozin propanediol (FARXIGA) 10 MG TABS tablet Take 1 tablet (10 mg total) by mouth daily before breakfast. 90 tablet 1   Evolocumab (REPATHA SURECLICK) 416 MG/ML SOAJ Inject 1 pen  into the skin every 14 (fourteen) days. 6 mL 3   Ginkgo Biloba (GINKOBA PO) Take 1 tablet by mouth daily.     icosapent Ethyl (VASCEPA) 1 g capsule Take 2 capsules (2 g total) by mouth 2 (two) times daily. 360 capsule 2   lisinopril (ZESTRIL) 40 MG tablet Take 1 tablet (40 mg total) by mouth daily. 90 tablet 3   Magnesium 400 MG CAPS Take 400 mg by mouth daily.      metFORMIN (GLUCOPHAGE-XR) 500 MG 24 hr tablet TAKE 2 TABLETS BY MOUTH EVERY DAY 180 tablet 3   methocarbamol (ROBAXIN) 500 MG tablet Take 1 tablet (500 mg total) by mouth every 8 (eight) hours as needed for muscle spasms. 300 tablet 0   Multiple Vitamin (MULTIVITAMIN) tablet Take 1  tablet by mouth daily.     nitroGLYCERIN (NITROSTAT) 0.4 MG SL tablet Place 1 tablet (0.4 mg total) under the tongue every 5 (five) minutes as needed for chest pain. 30 tablet 11   ONETOUCH VERIO test strip USE AS DIRECTED 100 strip 0   pantoprazole (PROTONIX) 40 MG tablet TAKE 1 TABLET BY MOUTH EVERY DAY 90 tablet 3   rosuvastatin (CRESTOR) 5 MG tablet Take 0.5 tablets (2.5 mg total) by mouth daily. 45 tablet 1   RYBELSUS 14 MG TABS TAKE 1 TABLET BY MOUTH EVERY DAY 90 tablet 3   testosterone (ANDROGEL) 50 MG/5GM (1%) GEL APPLY 50 MG (ONE PACKET) TOPICALLY DAILY. 150 g 2   Zinc 50 MG TABS Take 50 mg by mouth daily.     Current Facility-Administered Medications on File Prior to Visit  Medication Dose Route Frequency Provider Last Rate Last Admin   regadenoson (LEXISCAN) injection SOLN 0.4 mg  0.4 mg Intravenous Once Donato Heinz, MD       Allergies  Allergen Reactions   Actos [Pioglitazone Hydrochloride]     Weight gain   Family History  Problem Relation Age of Onset   Colon cancer Mother        Colon Cancer-at advanced age- in her 35's per pt    Heart disease Father    Breast cancer Sister    Esophageal cancer Neg Hx    Rectal cancer Neg Hx    Stomach cancer Neg Hx    PE: BP 120/82 (BP Location: Right Arm, Patient Position: Sitting, Cuff Size: Normal)   Pulse 75   Ht 5' 7"  (1.702 m)   Wt 193 lb 6.4 oz (  87.7 kg)   SpO2 95%   BMI 30.29 kg/m  Wt Readings from Last 3 Encounters:  10/11/22 193 lb 6.4 oz (87.7 kg)  09/18/22 193 lb 12.8 oz (87.9 kg)  06/11/22 198 lb (89.8 kg)   Constitutional: overweight, in NAD Eyes: PERRLA, EOMI, no exophthalmos ENT:  no thyromegaly, no cervical lymphadenopathy Cardiovascular: RRR, No MRG Respiratory: CTA B Musculoskeletal: no deformities Skin:  no rashes Neurological: no tremor with outstretched hands  ASSESSMENT: 1. DM2, noninsulin-dependent, uncontrolled, with complications - CAD  2. HL  PLAN:  1. Patient with  longstanding,, type 2 diabetes, on oral antidiabetic regimen with metformin, SGLT2 inhibitor, and GLP-1 receptor agonist, which greatly improved control at last check: 5.8%.  At that time, he had anemia and just started iron.  This may have been influencing the results.  He was not checking blood sugars frequently, only approximately twice a month.  I did advise him to start checking every day or every other day, rotating check times.  Last visit, whenever he was checking blood sugars, they were slightly high in the morning and better later in the day.  We did not change his regimen at that time. -At today's visit, sugars are at goal most of the time he checks.  He only checks more frequently in the last week.  He had 1 mildly low blood sugar at 67, but this was in the 90s when checked again on the other finger.  He is wondering whether he needed to change his meter, but we discussed that meters may have up to 20% better margin.  I did not suggest a change for now. -We did discuss about checking sugars more frequently, but for now, no changes needed in his regimen. - I suggested to:  Patient Instructions  Please continue: - Metformin ER 1000 mg 2x a day, with meals - Farxiga 10 mg before breakfast - Rybelsus 14 mg before breakfast  Please return in 6 months with your meter.  - we checked his HbA1c: 5.9% (excellent, only slightly higher) - advised to check sugars at different times of the day - 1x a day, rotating check times - advised for yearly eye exams >> he is UTD - return to clinic in 6 months  2. HL -Reviewed latest lipid panel from 01/2022: Perplexing LDL level, at goal, though, rest of the fractions also at goal: Lab Results  Component Value Date   CHOL 77 01/10/2022   HDL 56.70 01/10/2022   LDLCALC -3 (L) 01/10/2022   LDLDIRECT 118 (H) 08/23/2019   TRIG 114.0 01/10/2022   CHOLHDL 1 01/10/2022  -He is on Crestor 2.5 mg daily (dose reduced after the above results returned), Vascepa 2  g twice a day, Repatha, and co-Q10.  Philemon Kingdom, MD PhD [facility] Endocrinology

## 2022-10-11 NOTE — Patient Instructions (Signed)
Please continue: - Metformin ER 1000 mg 2x a day, with meals - Farxiga 10 mg before breakfast - Rybelsus 14 mg before breakfast  Please return in 6 months with your meter.

## 2022-10-20 ENCOUNTER — Other Ambulatory Visit: Payer: Self-pay | Admitting: Surgery

## 2022-10-21 ENCOUNTER — Other Ambulatory Visit: Payer: Self-pay

## 2022-10-21 MED ORDER — ICOSAPENT ETHYL 1 G PO CAPS: 2.0000 g | ORAL_CAPSULE | Freq: Two times a day (BID) | ORAL | 3 refills | Status: DC

## 2022-10-31 ENCOUNTER — Other Ambulatory Visit: Payer: Self-pay | Admitting: Internal Medicine

## 2022-10-31 DIAGNOSIS — R7989 Other specified abnormal findings of blood chemistry: Secondary | ICD-10-CM

## 2022-11-05 ENCOUNTER — Telehealth: Payer: Self-pay | Admitting: Cardiology

## 2022-11-05 NOTE — Telephone Encounter (Signed)
Patient called stating his insurance company is changing coverage they will only cover the generic brand vascepa not the icosapent.  This will be as of Januray 1, 2024.

## 2022-11-08 ENCOUNTER — Other Ambulatory Visit (HOSPITAL_COMMUNITY): Payer: Self-pay

## 2022-11-13 ENCOUNTER — Other Ambulatory Visit (HOSPITAL_COMMUNITY): Payer: Self-pay

## 2022-11-14 ENCOUNTER — Telehealth: Payer: Self-pay | Admitting: Pharmacy Technician

## 2022-11-14 ENCOUNTER — Other Ambulatory Visit (HOSPITAL_COMMUNITY): Payer: Self-pay

## 2022-11-14 NOTE — Telephone Encounter (Signed)
-----   Message from Lauralyn Primes, [place] sent at 11/08/2022  1:44 PM EST ----- Regarding: RE: PA Pt received a letter stating after the new year it will require a PA.  ----- Message ----- From: Iran Planas, CPhT Sent: 11/08/2022  12:17 PM EST To: Lauralyn Primes, RMA Subject: RE: PA                                         This medication does not require a prior authorization at this time. Patient last filled 09-09-2022 for 90 days and cannot be filled again until January. ----- Message ----- From: Lauralyn Primes, RMA Sent: 11/06/2022   3:34 PM EST To: Rx Prior Auth Team Subject: PA                                             Pt was advised prior auth was needed for Rybelsus. Can we initiate. Thank you.

## 2022-11-14 NOTE — Telephone Encounter (Signed)
Pharmacy Patient Advocate Encounter   Received notification from RMA/office that prior authorization for Rybelsus '14mg'$  is required/requested.  Per Test Claim: refillable after 11/16/22 (will check again Monday)

## 2022-11-15 ENCOUNTER — Ambulatory Visit: Payer: Commercial Managed Care - HMO | Admitting: Orthopaedic Surgery

## 2022-11-18 ENCOUNTER — Telehealth: Payer: Medicare Other

## 2022-11-18 ENCOUNTER — Other Ambulatory Visit (HOSPITAL_COMMUNITY): Payer: Self-pay

## 2022-11-18 NOTE — Telephone Encounter (Signed)
Pharmacy Patient Advocate Encounter  Per Test Claim: Getting a paid claim for $0. I'll follow up with the pt to find out if it was after a certain time frame.

## 2022-11-21 ENCOUNTER — Other Ambulatory Visit (HOSPITAL_COMMUNITY): Payer: Self-pay

## 2022-11-25 ENCOUNTER — Other Ambulatory Visit: Payer: Self-pay

## 2022-11-25 MED ORDER — ATENOLOL 25 MG PO TABS: 25.0000 mg | ORAL_TABLET | Freq: Every day | ORAL | 3 refills | Status: DC

## 2022-11-29 ENCOUNTER — Ambulatory Visit (INDEPENDENT_AMBULATORY_CARE_PROVIDER_SITE_OTHER): Payer: 59

## 2022-11-29 ENCOUNTER — Ambulatory Visit (INDEPENDENT_AMBULATORY_CARE_PROVIDER_SITE_OTHER): Payer: 59 | Admitting: Orthopaedic Surgery

## 2022-11-29 ENCOUNTER — Encounter: Payer: Self-pay | Admitting: Orthopaedic Surgery

## 2022-11-29 VITALS — BP 124/85 | HR 73 | Ht 67.0 in | Wt 193.0 lb

## 2022-11-29 DIAGNOSIS — Z96641 Presence of right artificial hip joint: Secondary | ICD-10-CM | POA: Diagnosis not present

## 2022-11-29 DIAGNOSIS — R42 Dizziness and giddiness: Secondary | ICD-10-CM | POA: Diagnosis not present

## 2022-11-29 NOTE — Progress Notes (Signed)
Office Visit Note   Patient: [name] [name]           Date of Birth: 1951/02/06           MRN: [id number] Visit Date: 11/29/2022              Requested by: Isaac Bliss, Rayford Halsted, MD [place],  [place] [address] PCP: Isaac Bliss, Rayford Halsted, MD   Assessment & Plan: Visit Diagnoses:  1. Dizziness after extension of neck   2. Status post total replacement of right hip     Plan: We discussed his dizziness will be more appropriately evaluated by a neurologist.  Will send omeprazole to physical therapy for cervical spine treatment I can recheck him in 6 weeks if he is having persistent symptoms in his neck we can proceed with MRI imaging of the cervical spine.  Follow-Up Instructions: Return in about 6 weeks (around 01/10/2023).   Orders:  Orders Placed This Encounter  Procedures   XR HIP UNILAT W OR W/O PELVIS 2-3 VIEWS RIGHT   XR Cervical Spine 2 or 3 views   Ambulatory referral to Physical Therapy   No orders of the defined types were placed in this encounter.     Procedures: No procedures performed   Clinical Data: No additional findings.   Subjective: Chief Complaint  Patient presents with   Right Hip - Routine Post Op    HPI 72 year old male returns states he has had trouble with dizziness at times with head movement not always reproducible.  He denies symptoms that would suggest crystals in the semicircular canal.  He has been told by his cardiologist to wear compression socks which helps.  States his right total hip arthroplasty from 10/28/2012 is doing well.  He has some discomfort in his neck and notes popping with rotation and some aching in his neck but no loss of function of his hand.  Have type 2 diabetes.  Denies digit triggering.  Does not have hand pain that wakes him up at night.  Review of Systems all systems are noncontributory to HPI.   Objective: Vital Signs: BP 124/85   Pulse 73   Ht '5\' 7"'$  (1.702 m)   Wt 193 lb (87.5  kg)   BMI 30.23 kg/m   Physical Exam Constitutional:      Appearance: He is well-developed.  HENT:     Head: Normocephalic and atraumatic.     Right Ear: External ear normal.     Left Ear: External ear normal.  Eyes:     Pupils: Pupils are equal, round, and reactive to light.  Neck:     Thyroid: No thyromegaly.     Trachea: No tracheal deviation.  Cardiovascular:     Rate and Rhythm: Normal rate.  Pulmonary:     Effort: Pulmonary effort is normal.     Breath sounds: No wheezing.  Abdominal:     General: Bowel sounds are normal.     Palpations: Abdomen is soft.  Musculoskeletal:     Cervical back: Neck supple.  Skin:    General: Skin is warm and dry.     Capillary Refill: Capillary refill takes less than 2 seconds.  Neurological:     Mental Status: He is alert and oriented to person, place, and time.  Psychiatric:        Behavior: Behavior normal.        Thought Content: Thought content normal.        Judgment:  Judgment normal.     Ortho Exam some brachial plexus tenderness and reproduction of neck pain with rotation.  Upper extremity reflexes are 2+ normal heel-toe gait.  Good hip range of motion.  Specialty Comments:  No specialty comments available.  Imaging: No results found.   PMFS History: Patient Active Problem List   Diagnosis Date Noted   Hyperlipidemia associated with type 2 diabetes mellitus ([place]) 10/11/2022   History of angiodysplasia of intestinal tract    Microcytic anemia 01/14/2022   Low testosterone 11/21/2017   S/P PTCA (percutaneous transluminal coronary angioplasty) 11/18/2015   Coronary artery disease due to lipid rich plaque 11/18/2015   Elevated LFTs 11/18/2015   Wellness examination 10/18/2015   Smoker 10/18/2015   HNP (herniated nucleus pulposus), lumbar 05/18/2014   Hypercalcemia 09/29/2013   NASH (nonalcoholic steatohepatitis) 08/28/2012   Screening for prostate cancer 08/28/2012   Hypogonadism male 08/28/2012   Testosterone  deficiency in male 07/22/2011   Fatigue 07/05/2011   RBBB 10/12/2010   HEMORRHOIDS-INTERNAL 06/18/2010   ARTHRALGIA 01/18/2010   TOBACCO USER 07/11/2009   POSTURAL LIGHTHEADEDNESS 07/11/2009   DIVERTICULOSIS, COLON 11/18/2008   BACK PAIN, LUMBAR 09/01/2008   SWEATING 07/26/2008   Diabetes mellitus type II, controlled ([place]) 01/20/2008   COUGH 01/20/2008   Dyslipidemia 06/02/2007   Essential hypertension 06/02/2007   Coronary atherosclerosis 06/02/2007   GERD 06/02/2007   Past Medical History:  Diagnosis Date   Anemia, iron deficiency 11/18/2008   Resolved, despite no rx--2012     ANEMIA-IRON DEFICIENCY    Arthritis    BACK PAIN, LUMBAR    Blood transfusion without reported diagnosis    CORONARY ARTERY DISEASE    DIVERTICULOSIS, COLON    Elevated LFTs 11/18/2015   GERD    Hepatitis    States he was tested for Hep B and he had antibodies   HYPERLIPIDEMIA    HYPERTENSION    dr tom wall   Nephrolithiasis    PONV (postoperative nausea and vomiting)    little nausea after having his hip replacement   Type 2 diabetes mellitus ([facility])    Unspecified disorder of urethra and urinary tract     Family History  Problem Relation Age of Onset   Colon cancer Mother        Colon Cancer-at advanced age- in her 50's per pt    Heart disease Father    Breast cancer Sister    Esophageal cancer Neg Hx    Rectal cancer Neg Hx    Stomach cancer Neg Hx     Past Surgical History:  Procedure Laterality Date   COLONOSCOPY     CORONARY ANGIOPLASTY WITH STENT PLACEMENT     06    dr t. wall   ELECTROCARDIOGRAM  03/30/2007   ENTEROSCOPY N/A 04/30/2022   Procedure: ENTEROSCOPY;  Surgeon: Gatha Mayer, MD;  Location: [facility];  Service: Gastroenterology;  Laterality: N/A;   ESOPHAGOGASTRODUODENOSCOPY  11/07/2005   kidney stones     in office procedure to remove but no sedation surgeries for kidney stones    LUMBAR LAMINECTOMY Right 05/18/2014   Procedure: LUMBAR LAMINECTOMY Microdiscectomy  Right Lumbar five - sacral one;  Surgeon: [name], MD;  Location: [facility];  Service: Orthopedics;  Laterality: Right;   Stress Cardiolite  11/30/2004   SUBMUCOSAL TATTOO INJECTION  04/30/2022   Procedure: SUBMUCOSAL TATTOO INJECTION;  Surgeon: Gatha Mayer, MD;  Location: [facility];  Service: Gastroenterology;;   TOTAL HIP ARTHROPLASTY  10/28/2012   Procedure:  TOTAL HIP ARTHROPLASTY;  Surgeon: [name], MD;  Location: [facility];  Service: Orthopedics;  Laterality: Right;   UPPER GASTROINTESTINAL ENDOSCOPY     Social History   Occupational History   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  Vaping Use   Vaping Use: Never used  Substance and Sexual Activity   Alcohol use: Yes    Alcohol/week: 0.0 standard drinks of alcohol    Comment: rare   Drug use: No   Sexual activity: Not Currently

## 2022-12-09 ENCOUNTER — Ambulatory Visit: Payer: 59 | Attending: Orthopaedic Surgery

## 2022-12-09 ENCOUNTER — Other Ambulatory Visit: Payer: Self-pay

## 2022-12-09 DIAGNOSIS — R42 Dizziness and giddiness: Secondary | ICD-10-CM | POA: Diagnosis not present

## 2022-12-09 DIAGNOSIS — M47812 Spondylosis without myelopathy or radiculopathy, cervical region: Secondary | ICD-10-CM | POA: Insufficient documentation

## 2022-12-09 DIAGNOSIS — M436 Torticollis: Secondary | ICD-10-CM | POA: Diagnosis not present

## 2022-12-09 NOTE — Therapy (Signed)
OUTPATIENT PHYSICAL THERAPY CERVICAL EVALUATION   Patient Name: [name] [name] MRN: [id number] DOB:October 05, 1951, 72 y.o., male Today's Date: 12/09/2022  END OF SESSION:  PT End of Session - 12/09/22 1649     Date for PT Re-Evaluation 03/03/23    Progress Note Due on Visit 10    PT Start Time 1435    PT Stop Time 1521    PT Time Calculation (min) 46 min    Activity Tolerance Patient tolerated treatment well    Behavior During Therapy [facility] for tasks assessed/performed             Past Medical History:  Diagnosis Date   Anemia, iron deficiency 11/18/2008   Resolved, despite no rx--2012     ANEMIA-IRON DEFICIENCY    Arthritis    BACK PAIN, LUMBAR    Blood transfusion without reported diagnosis    CORONARY ARTERY DISEASE    DIVERTICULOSIS, COLON    Elevated LFTs 11/18/2015   GERD    Hepatitis    States he was tested for Hep B and he had antibodies   HYPERLIPIDEMIA    HYPERTENSION    dr tom wall   Nephrolithiasis    PONV (postoperative nausea and vomiting)    little nausea after having his hip replacement   Type 2 diabetes mellitus ([place])    Unspecified disorder of urethra and urinary tract    Past Surgical History:  Procedure Laterality Date   COLONOSCOPY     CORONARY ANGIOPLASTY WITH STENT PLACEMENT     06    dr t. wall   ELECTROCARDIOGRAM  03/30/2007   ENTEROSCOPY N/A 04/30/2022   Procedure: ENTEROSCOPY;  Surgeon: Gatha Mayer, MD;  Location: [facility];  Service: Gastroenterology;  Laterality: N/A;   ESOPHAGOGASTRODUODENOSCOPY  11/07/2005   kidney stones     in office procedure to remove but no sedation surgeries for kidney stones    LUMBAR LAMINECTOMY Right 05/18/2014   Procedure: LUMBAR LAMINECTOMY Microdiscectomy Right Lumbar five - sacral one;  Surgeon: [name], MD;  Location: [place];  Service: Orthopedics;  Laterality: Right;   Stress Cardiolite  11/30/2004   SUBMUCOSAL TATTOO INJECTION  04/30/2022   Procedure: SUBMUCOSAL TATTOO INJECTION;  Surgeon:  Gatha Mayer, MD;  Location: [facility];  Service: Gastroenterology;;   TOTAL HIP ARTHROPLASTY  10/28/2012   Procedure: TOTAL HIP ARTHROPLASTY;  Surgeon: [name], MD;  Location: [place];  Service: Orthopedics;  Laterality: Right;   UPPER GASTROINTESTINAL ENDOSCOPY     Patient Active Problem List   Diagnosis Date Noted   Hyperlipidemia associated with type 2 diabetes mellitus ([place]) 10/11/2022   History of angiodysplasia of intestinal tract    Microcytic anemia 01/14/2022   Low testosterone 11/21/2017   S/P PTCA (percutaneous transluminal coronary angioplasty) 11/18/2015   Coronary artery disease due to lipid rich plaque 11/18/2015   Elevated LFTs 11/18/2015   Wellness examination 10/18/2015   Smoker 10/18/2015   HNP (herniated nucleus pulposus), lumbar 05/18/2014   Hypercalcemia 09/29/2013   NASH (nonalcoholic steatohepatitis) 08/28/2012   Screening for prostate cancer 08/28/2012   Hypogonadism male 08/28/2012   Testosterone deficiency in male 07/22/2011   Fatigue 07/05/2011   RBBB 10/12/2010   HEMORRHOIDS-INTERNAL 06/18/2010   ARTHRALGIA 01/18/2010   TOBACCO USER 07/11/2009   POSTURAL LIGHTHEADEDNESS 07/11/2009   DIVERTICULOSIS, COLON 11/18/2008   BACK PAIN, LUMBAR 09/01/2008   SWEATING 07/26/2008   Diabetes mellitus type II, controlled ([place]) 01/20/2008   COUGH 01/20/2008   Dyslipidemia 06/02/2007  Essential hypertension 06/02/2007   Coronary atherosclerosis 06/02/2007   GERD 06/02/2007    PCP: Domingo Mend, MD  REFERRING PROVIDER: Rodell Perna, MD  REFERRING DIAG: upper cervical spondylosis  THERAPY DIAG:  CS (cervical spondylosis)  Stiffness of cervical spine  Dizziness and giddiness  Rationale for Evaluation and Treatment: Rehabilitation  ONSET DATE: 09/08/22  SUBJECTIVE:                                                                                                                                                                                                          SUBJECTIVE STATEMENT: When I reach up with both arms and look up I get dizzy, also sometimes when I turn my head. When getting out of bed.  Also dizzy when I am standing and looking up  PERTINENT HISTORY:  Patient reports 3 month history of worsening dizziness, primarily when standing, and looking up, also with reaching up with B arms.  Resolves within one min of placing head/neck in resting position.  PAIN:  Are you having pain? No  PRECAUTIONS: None  WEIGHT BEARING RESTRICTIONS: No  FALLS:  Has patient fallen in last 6 months? No  LIVING ENVIRONMENT: Lives with: alone Lives in: House/apartment Stairs: No Has following equipment at home: None  OCCUPATION: retired  PLOF: Independent with basic ADLs  PATIENT GOALS: get rid of dizziness  NEXT MD VISIT: 01/10/23  OBJECTIVE:   DIAGNOSTIC FINDINGS: copied from 11/29/22 x ray interpretation: There is cervical spondylosis with upper cervical reversal of curve.   Retrolisthesis at C2-3 C3-4 C4-5.  Disc space narrowing and spurring with  facet arthropathy.  Bilateral carotid calcification at the bifurcation  noted right and left.   Impression: Multilevel cervical spondylosis mid upper level cervical  spine.   PATIENT SURVEYS:  .FOTO   COGNITION: Overall cognitive status: Within functional limits for tasks assessed  SENSATION: WFL  POSTURE:  barrel chested, mild forward head, increased upper thoracic kyphosis  PALPATION: Tender spinous processes C 2 C 3, posterior spinal lig  Decreased jt mobility noted lateral glides pronounced C 2,3,4.   CERVICAL ROM:   Active ROM A/PROM (deg) eval  Flexion wfl  Extension 50%  Right lateral flexion 45%  Left lateral flexion 45%  Right rotation 75%  Left rotation 50%   (Blank rows = not tested) THORACIC ROM:  rotation,extension , flexion all wfl , non provoking UPPER EXTREMITY ROM:  Active ROM Right eval Left eval  Shoulder flexion All wfl All  wfl  Shoulder extension    Shoulder abduction    Shoulder  adduction    Shoulder extension    Shoulder internal rotation    Shoulder external rotation    Elbow flexion    Elbow extension    Wrist flexion    Wrist extension    Wrist ulnar deviation    Wrist radial deviation    Wrist pronation    Wrist supination     (Blank rows = not tested)  UPPER EXTREMITY MMT:  MMT Right eval Left eval  Shoulder flexion All wfl All wfl  Shoulder extension    Shoulder abduction    Shoulder adduction    Shoulder extension    Shoulder internal rotation    Shoulder external rotation    Middle trapezius    Lower trapezius    Elbow flexion    Elbow extension    Wrist flexion    Wrist extension    Wrist ulnar deviation    Wrist radial deviation    Wrist pronation    Wrist supination    Grip strength     (Blank rows = not tested)  CERVICAL SPECIAL TESTS:  Cranial cervical flexion test: L rotation 20 %, no Sx provocation, Upper limb tension test (ULTT): Negative, and Distraction test: Negative Seated vestibular artery test - B  FUNCTIONAL TESTS:  na  TODAY'S TREATMENT:                                                                                                                              DATE: 12/09/22 Evaluation and manual techniques:  Upper cervical contract /relax for L rotation, 2 bouts, 5 sec holds, with improvement noted in active L rotation  PATIENT EDUCATION:  Education details: educated in [place], home program, anatomy of c spine, purpose/rationale for therex Person educated: Patient Education method: Consulting civil engineer, Media planner, Verbal cues, and Handouts Education comprehension: returned demonstration, verbal cues required, and needs further education  HOME EXERCISE PROGRAM: Access Code: [email or web address] URL: [email or web address] Date: 12/09/2022 Prepared by: Hennessy Bartel  Exercises - Seated Assisted Cervical Rotation with Towel  - 1 x daily - 7 x weekly - 3  sets - 10 reps - Cervical Extension AROM with Strap  - 1 x daily - 7 x weekly - 3 sets - 10 reps  ASSESSMENT:  CLINICAL IMPRESSION: Patient is a 72 y.o. male who was seen today for physical therapy evaluation and treatment for dizziness, provoked by neck extension. Screened for possible BPPV but no noted history of Sx provocation that is typical with this Dx, and no nystagmus, provocation of dizziness noted with today's evaluation. Has marked tenderness upper cervical soft tissue and spinous processes, as well as limited upper cervical spine L rotation.  Also he notes that he has adapted his positioning in bed for the last several months , has to sleep with several pillows as lying flat causes headaches, dizziness. Will benefit from a trial of skilled physical therapy to address his Sx , primarily upper cervical spine joint mobility and soft tissue  restrictions, and decrease the incidence of his dizziness.  He may benefit from trial of cervical traction as well.  Responded well, no pain with SNAG's instruction today  OBJECTIVE IMPAIRMENTS: decreased ROM, dizziness, hypomobility, impaired flexibility, and postural dysfunction.   ACTIVITY LIMITATIONS: lifting, sleeping, bed mobility, and reach over head  PARTICIPATION LIMITATIONS:  na  PERSONAL FACTORS: Fitness and 1-2 comorbidities: DM, HTN  are also affecting patient's functional outcome.   REHAB POTENTIAL: Good  CLINICAL DECISION MAKING: Stable/uncomplicated  EVALUATION COMPLEXITY: Low   GOALS: Goals reviewed with patient? Yes  SHORT TERM GOALS: Target date:12/23/22      I HEP for managing cervical spine stiffness Baseline: initiated today, at eval Goal status: INITIAL  LONG TERM GOALS: Target date: 03/03/23  FOTO score improve from 67 to 80%  Baseline: 67 Goal status: INITIAL  2.  Improve cervical spine AROM L rotation to 65% or greater Baseline: 50% Goal status: INITIAL  3. Complete  Resolution of dizziness with  cervical extension Baseline: consistent dizziness with terminal cervical extension motion Goal status: INITIAL   PLAN:  PT FREQUENCY: 1x/week  PT DURATION: 12 weeks  PLANNED INTERVENTIONS: Therapeutic exercises, Therapeutic activity, Neuromuscular re-education, Balance training, Gait training, Patient/Family education, Self Care, Joint mobilization, Dry Needling, Spinal mobilization, Cryotherapy, Moist heat, Taping, Traction, Manual therapy, and Re-evaluation  PLAN FOR NEXT SESSION: consider dry needling upper cervical musculature, stretching for upper traps, suboccipital musculature, trial of cervical traction, manual stretching, possibly jt mobilizations c spine.   Tomasita Beevers L Cadee Agro, PT 12/09/2022, 4:51 PM

## 2022-12-11 DIAGNOSIS — H2513 Age-related nuclear cataract, bilateral: Secondary | ICD-10-CM | POA: Diagnosis not present

## 2022-12-11 LAB — HM DIABETES EYE EXAM

## 2022-12-19 ENCOUNTER — Ambulatory Visit: Payer: 59 | Attending: Orthopaedic Surgery

## 2022-12-19 DIAGNOSIS — M47812 Spondylosis without myelopathy or radiculopathy, cervical region: Secondary | ICD-10-CM | POA: Diagnosis not present

## 2022-12-19 DIAGNOSIS — M436 Torticollis: Secondary | ICD-10-CM | POA: Insufficient documentation

## 2022-12-19 DIAGNOSIS — R42 Dizziness and giddiness: Secondary | ICD-10-CM | POA: Insufficient documentation

## 2022-12-19 NOTE — Therapy (Signed)
OUTPATIENT PHYSICAL THERAPY CERVICAL TREATMENT   Patient Name: [name] [name] MRN: [id number] DOB:09-26-1951, 72 y.o., male Today's Date: 12/19/2022  END OF SESSION:  PT End of Session - 12/19/22 1516     Visit Number 2    Date for PT Re-Evaluation 03/03/23    PT Start Time 1517    PT Stop Time 1950    PT Time Calculation (min) 41 min              Past Medical History:  Diagnosis Date   Anemia, iron deficiency 11/18/2008   Resolved, despite no rx--2012     ANEMIA-IRON DEFICIENCY    Arthritis    BACK PAIN, LUMBAR    Blood transfusion without reported diagnosis    CORONARY ARTERY DISEASE    DIVERTICULOSIS, COLON    Elevated LFTs 11/18/2015   GERD    Hepatitis    States he was tested for Hep B and he had antibodies   HYPERLIPIDEMIA    HYPERTENSION    dr tom wall   Nephrolithiasis    PONV (postoperative nausea and vomiting)    little nausea after having his hip replacement   Type 2 diabetes mellitus ([place])    Unspecified disorder of urethra and urinary tract    Past Surgical History:  Procedure Laterality Date   COLONOSCOPY     CORONARY ANGIOPLASTY WITH STENT PLACEMENT     06    dr t. wall   ELECTROCARDIOGRAM  03/30/2007   ENTEROSCOPY N/A 04/30/2022   Procedure: ENTEROSCOPY;  Surgeon: Gatha Mayer, MD;  Location: [facility];  Service: Gastroenterology;  Laterality: N/A;   ESOPHAGOGASTRODUODENOSCOPY  11/07/2005   kidney stones     in office procedure to remove but no sedation surgeries for kidney stones    LUMBAR LAMINECTOMY Right 05/18/2014   Procedure: LUMBAR LAMINECTOMY Microdiscectomy Right Lumbar five - sacral one;  Surgeon: [name], MD;  Location: [place];  Service: Orthopedics;  Laterality: Right;   Stress Cardiolite  11/30/2004   SUBMUCOSAL TATTOO INJECTION  04/30/2022   Procedure: SUBMUCOSAL TATTOO INJECTION;  Surgeon: Gatha Mayer, MD;  Location: [facility];  Service: Gastroenterology;;   TOTAL HIP ARTHROPLASTY  10/28/2012   Procedure: TOTAL  HIP ARTHROPLASTY;  Surgeon: [name], MD;  Location: [place];  Service: Orthopedics;  Laterality: Right;   UPPER GASTROINTESTINAL ENDOSCOPY     Patient Active Problem List   Diagnosis Date Noted   Hyperlipidemia associated with type 2 diabetes mellitus ([place]) 10/11/2022   History of angiodysplasia of intestinal tract    Microcytic anemia 01/14/2022   Low testosterone 11/21/2017   S/P PTCA (percutaneous transluminal coronary angioplasty) 11/18/2015   Coronary artery disease due to lipid rich plaque 11/18/2015   Elevated LFTs 11/18/2015   Wellness examination 10/18/2015   Smoker 10/18/2015   HNP (herniated nucleus pulposus), lumbar 05/18/2014   Hypercalcemia 09/29/2013   NASH (nonalcoholic steatohepatitis) 08/28/2012   Screening for prostate cancer 08/28/2012   Hypogonadism male 08/28/2012   Testosterone deficiency in male 07/22/2011   Fatigue 07/05/2011   RBBB 10/12/2010   HEMORRHOIDS-INTERNAL 06/18/2010   ARTHRALGIA 01/18/2010   TOBACCO USER 07/11/2009   POSTURAL LIGHTHEADEDNESS 07/11/2009   DIVERTICULOSIS, COLON 11/18/2008   BACK PAIN, LUMBAR 09/01/2008   SWEATING 07/26/2008   Diabetes mellitus type II, controlled ([place]) 01/20/2008   COUGH 01/20/2008   Dyslipidemia 06/02/2007   Essential hypertension 06/02/2007   Coronary atherosclerosis 06/02/2007   GERD 06/02/2007    PCP: Domingo Mend, MD  REFERRING  PROVIDER: Rodell Perna, MD  REFERRING DIAG: upper cervical spondylosis  THERAPY DIAG:  CS (cervical spondylosis)  Stiffness of cervical spine  Dizziness and giddiness  Rationale for Evaluation and Treatment: Rehabilitation  ONSET DATE: 09/08/22  SUBJECTIVE:                                                                                                                                                                                                         SUBJECTIVE STATEMENT: No reduction in dizziness since initiation of PT last week.  Having dizziness when I  use the towel for the exercise and bend back .  No problems with the other exercise  PERTINENT HISTORY:  Patient reports 3 month history of worsening dizziness, primarily when standing, and looking up, also with reaching up with B arms.  Resolves within one min of placing head/neck in resting position.  PAIN:  Are you having pain? No  PRECAUTIONS: None  WEIGHT BEARING RESTRICTIONS: No  FALLS:  Has patient fallen in last 6 months? No  LIVING ENVIRONMENT: Lives with: alone Lives in: House/apartment Stairs: No Has following equipment at home: None  OCCUPATION: retired  PLOF: Independent with basic ADLs  PATIENT GOALS: get rid of dizziness  NEXT MD VISIT: 01/10/23  OBJECTIVE:   DIAGNOSTIC FINDINGS: copied from 11/29/22 x ray interpretation: There is cervical spondylosis with upper cervical reversal of curve.   Retrolisthesis at C2-3 C3-4 C4-5.  Disc space narrowing and spurring with  facet arthropathy.  Bilateral carotid calcification at the bifurcation  noted right and left.   Impression: Multilevel cervical spondylosis mid upper level cervical  spine.   PATIENT SURVEYS:  .FOTO   COGNITION: Overall cognitive status: Within functional limits for tasks assessed  SENSATION: WFL  POSTURE:  barrel chested, mild forward head, increased upper thoracic kyphosis  PALPATION: Tender spinous processes C 2 C 3, posterior spinal lig  Decreased jt mobility noted lateral glides pronounced C 2,3,4.   CERVICAL ROM:   Active ROM A/PROM (deg) eval  Flexion wfl  Extension 50%  Right lateral flexion 45%  Left lateral flexion 45%  Right rotation 75%  Left rotation 50%   (Blank rows = not tested) THORACIC ROM:  rotation,extension , flexion all wfl , non provoking UPPER EXTREMITY ROM:  Active ROM Right eval Left eval  Shoulder flexion All wfl All wfl  Shoulder extension    Shoulder abduction    Shoulder adduction    Shoulder extension    Shoulder internal rotation     Shoulder external rotation    Elbow flexion  Elbow extension    Wrist flexion    Wrist extension    Wrist ulnar deviation    Wrist radial deviation    Wrist pronation    Wrist supination     (Blank rows = not tested)  UPPER EXTREMITY MMT:  MMT Right eval Left eval  Shoulder flexion All wfl All wfl  Shoulder extension    Shoulder abduction    Shoulder adduction    Shoulder extension    Shoulder internal rotation    Shoulder external rotation    Middle trapezius    Lower trapezius    Elbow flexion    Elbow extension    Wrist flexion    Wrist extension    Wrist ulnar deviation    Wrist radial deviation    Wrist pronation    Wrist supination    Grip strength     (Blank rows = not tested)  CERVICAL SPECIAL TESTS:  Cranial cervical flexion test: L rotation 20 %, no Sx provocation, Upper limb tension test (ULTT): Negative, and Distraction test: Negative Seated vestibular artery test - B  FUNCTIONAL TESTS:  na  TODAY'S TREATMENT:                                                                                                                              DATE:  12/19/22:  Manual:  positioned the patient supine for alternating bouts of manual cervical distraction, alternating with L upper traps, L levator stretching.  Also contract relax in flexed cervical position, for upper cervical L rotation, and supine upper cervical flexion with isometric resisted upper cervical extension.  All 2 bouts each, 6 reps, 3 to 5 sec holds.    Therex:  reviewed the cervical snags ex for L cervical rotation, with towel, corrected positioning and direction with R hand Added 1.  supine isometric chin tucks 5 sec holds, 10 reps 2.  Supine isometric chin tuck with B shoulder theraband ER, black band 3.  Pecs/ anterior chest wall stretch in door frame.      12/09/22 Evaluation and manual techniques:  Upper cervical contract /relax for L rotation, 2 bouts, 5 sec holds, with improvement noted in  active L rotation  PATIENT EDUCATION:  Education details: educated in [place], home program, anatomy of c spine, purpose/rationale for therex Person educated: Patient Education method: Consulting civil engineer, Media planner, Verbal cues, and Handouts Education comprehension: returned demonstration, verbal cues required, and needs further education  HOME EXERCISE PROGRAM: Access Code: [email or web address] URL: [email or web address] Date: 12/09/2022 Prepared by: Anaily Ashbaugh  Exercises - Seated Assisted Cervical Rotation with Towel  - 1 x daily - 7 x weekly - 3 sets - 10 reps - Cervical Extension AROM with Strap  - 1 x daily - 7 x weekly - 3 sets - 10 reps Access Code: [email or web address] URL: [email or web address] Date: 12/19/2022 Prepared by: Hayden Pedro  Exercises - Doorway Pec Stretch at 90 Degrees Abduction  - 2 x daily - 7 x  weekly - 1 sets - 10 reps - 5 sec  hold - Supine Cervical Retraction with Towel  - 2 x daily - 7 x weekly - 1 sets - 10 reps - 5 hold - Shoulder External Rotation and Scapular Retraction with Resistance  - 2 x daily - 7 x weekly - 1 sets - 10 reps  ASSESSMENT:  CLINICAL IMPRESSION: Patient is a 72 y.o. male who was seen today for physical therapy treatment for upper cervical spine stiffness.  Today he reported consistent dizziness with the self mobilization with towel and cervical extension, so advised him to stop this exercise. Did add additional ex and utilized manual techniques specifically for L sided upper cervical spine stiffness and mobility deficits today, which he tolerated well.  Restricted L cervical spine rotation today to 50% , but no pain.  He will attend again next week.  Returns to returning physician Mar 1.  Will reassess next visit.  He should most likely have additional imaging performed due to his consistent dizziness provoked by cervical spine extension. OBJECTIVE IMPAIRMENTS: decreased ROM, dizziness, hypomobility, impaired flexibility, and postural  dysfunction.   ACTIVITY LIMITATIONS: lifting, sleeping, bed mobility, and reach over head  PARTICIPATION LIMITATIONS:  na  PERSONAL FACTORS: Fitness and 1-2 comorbidities: DM, HTN  are also affecting patient's functional outcome.   REHAB POTENTIAL: Good  CLINICAL DECISION MAKING: Stable/uncomplicated  EVALUATION COMPLEXITY: Low   GOALS: Goals reviewed with patient? Yes  SHORT TERM GOALS: Target date:12/23/22      I HEP for managing cervical spine stiffness Baseline: initiated today, at eval Goal status: INITIAL  LONG TERM GOALS: Target date: 03/03/23  FOTO score improve from 67 to 80%  Baseline: 67 Goal status: INITIAL  2.  Improve cervical spine AROM L rotation to 65% or greater Baseline: 50% Goal status: INITIAL  3. Complete  Resolution of dizziness with cervical extension Baseline: consistent dizziness with terminal cervical extension motion Goal status: INITIAL   PLAN:  PT FREQUENCY: 1x/week  PT DURATION: 12 weeks  PLANNED INTERVENTIONS: Therapeutic exercises, Therapeutic activity, Neuromuscular re-education, Balance training, Gait training, Patient/Family education, Self Care, Joint mobilization, Dry Needling, Spinal mobilization, Cryotherapy, Moist heat, Taping, Traction, Manual therapy, and Re-evaluation  PLAN FOR NEXT SESSION: consider dry needling upper cervical musculature, stretching for upper traps, suboccipital musculature, trial of cervical traction, manual stretching, possibly jt mobilizations c spine.   Kamden Stanislaw L Braedan Meuth, PT 12/19/2022, 4:17 PM

## 2022-12-25 ENCOUNTER — Other Ambulatory Visit: Payer: Self-pay

## 2022-12-25 ENCOUNTER — Ambulatory Visit: Payer: 59

## 2022-12-25 DIAGNOSIS — R42 Dizziness and giddiness: Secondary | ICD-10-CM

## 2022-12-25 DIAGNOSIS — M436 Torticollis: Secondary | ICD-10-CM | POA: Diagnosis not present

## 2022-12-25 DIAGNOSIS — M47812 Spondylosis without myelopathy or radiculopathy, cervical region: Secondary | ICD-10-CM | POA: Diagnosis not present

## 2022-12-25 NOTE — Therapy (Signed)
OUTPATIENT PHYSICAL THERAPY CERVICAL Discharge visit   Patient Name: [name] [name] MRN: [id number] DOB:[birth year], 72 y.o., male Today's Date: 12/25/2022  END OF SESSION:  PT End of Session - 12/25/22 1546     PT Start Time 1432    PT Stop Time 1500    PT Time Calculation (min) 28 min    Activity Tolerance Patient tolerated treatment well    Behavior During Therapy [facility] for tasks assessed/performed              Past Medical History:  Diagnosis Date   Anemia, iron deficiency 11/18/2008   Resolved, despite no rx--2012     ANEMIA-IRON DEFICIENCY    Arthritis    BACK PAIN, LUMBAR    Blood transfusion without reported diagnosis    CORONARY ARTERY DISEASE    DIVERTICULOSIS, COLON    Elevated LFTs 11/18/2015   GERD    Hepatitis    States he was tested for Hep B and he had antibodies   HYPERLIPIDEMIA    HYPERTENSION    dr tom wall   Nephrolithiasis    PONV (postoperative nausea and vomiting)    little nausea after having his hip replacement   Type 2 diabetes mellitus ([place])    Unspecified disorder of urethra and urinary tract    Past Surgical History:  Procedure Laterality Date   COLONOSCOPY     CORONARY ANGIOPLASTY WITH STENT PLACEMENT     06    dr t. wall   ELECTROCARDIOGRAM  03/30/2007   ENTEROSCOPY N/A 04/30/2022   Procedure: ENTEROSCOPY;  Surgeon: Gatha Mayer, MD;  Location: [facility];  Service: Gastroenterology;  Laterality: N/A;   ESOPHAGOGASTRODUODENOSCOPY  11/07/2005   kidney stones     in office procedure to remove but no sedation surgeries for kidney stones    LUMBAR LAMINECTOMY Right 05/18/2014   Procedure: LUMBAR LAMINECTOMY Microdiscectomy Right Lumbar five - sacral one;  Surgeon: [name], MD;  Location: [place];  Service: Orthopedics;  Laterality: Right;   Stress Cardiolite  11/30/2004   SUBMUCOSAL TATTOO INJECTION  04/30/2022   Procedure: SUBMUCOSAL TATTOO INJECTION;  Surgeon: Gatha Mayer, MD;  Location: [facility];  Service:  Gastroenterology;;   TOTAL HIP ARTHROPLASTY  10/28/2012   Procedure: TOTAL HIP ARTHROPLASTY;  Surgeon: [name], MD;  Location: [place];  Service: Orthopedics;  Laterality: Right;   UPPER GASTROINTESTINAL ENDOSCOPY     Patient Active Problem List   Diagnosis Date Noted   Hyperlipidemia associated with type 2 diabetes mellitus ([place]) 10/11/2022   History of angiodysplasia of intestinal tract    Microcytic anemia 01/14/2022   Low testosterone 11/21/2017   S/P PTCA (percutaneous transluminal coronary angioplasty) 11/18/2015   Coronary artery disease due to lipid rich plaque 11/18/2015   Elevated LFTs 11/18/2015   Wellness examination 10/18/2015   Smoker 10/18/2015   HNP (herniated nucleus pulposus), lumbar 05/18/2014   Hypercalcemia 09/29/2013   NASH (nonalcoholic steatohepatitis) 08/28/2012   Screening for prostate cancer 08/28/2012   Hypogonadism male 08/28/2012   Testosterone deficiency in male 07/22/2011   Fatigue 07/05/2011   RBBB 10/12/2010   HEMORRHOIDS-INTERNAL 06/18/2010   ARTHRALGIA 01/18/2010   TOBACCO USER 07/11/2009   POSTURAL LIGHTHEADEDNESS 07/11/2009   DIVERTICULOSIS, COLON 11/18/2008   BACK PAIN, LUMBAR 09/01/2008   SWEATING 07/26/2008   Diabetes mellitus type II, controlled ([facility]) 01/20/2008   COUGH 01/20/2008   Dyslipidemia 06/02/2007   Essential hypertension 06/02/2007   Coronary atherosclerosis 06/02/2007   GERD 06/02/2007  PCP: Domingo Mend, MD  REFERRING PROVIDER: Rodell Perna, MD  REFERRING DIAG: upper cervical spondylosis  THERAPY DIAG:  CS (cervical spondylosis)  Stiffness of cervical spine  Dizziness and giddiness  Rationale for Evaluation and Treatment: Rehabilitation  ONSET DATE: 09/08/22  SUBJECTIVE:                                                                                                                                                                                                         SUBJECTIVE STATEMENT:  Dizziness after the chin tucks.  But I like the door frame stretch and the band ex.  Ready to go back to doctor for further evaluation for the dizziness  PERTINENT HISTORY:  Patient reports 3 month history of worsening dizziness, primarily when standing, and looking up, also with reaching up with B arms.  Resolves within one min of placing head/neck in resting position.  PAIN:  Are you having pain? No  PRECAUTIONS: None  WEIGHT BEARING RESTRICTIONS: No  FALLS:  Has patient fallen in last 6 months? No  LIVING ENVIRONMENT: Lives with: alone Lives in: House/apartment Stairs: No Has following equipment at home: None  OCCUPATION: retired  PLOF: Independent with basic ADLs  PATIENT GOALS: get rid of dizziness  NEXT MD VISIT: 01/10/23  OBJECTIVE:   DIAGNOSTIC FINDINGS: copied from 11/29/22 x ray interpretation: There is cervical spondylosis with upper cervical reversal of curve.   Retrolisthesis at C2-3 C3-4 C4-5.  Disc space narrowing and spurring with  facet arthropathy.  Bilateral carotid calcification at the bifurcation  noted right and left.   Impression: Multilevel cervical spondylosis mid upper level cervical  spine.   PATIENT SURVEYS:  .FOTO   COGNITION: Overall cognitive status: Within functional limits for tasks assessed  SENSATION: WFL  POSTURE:  barrel chested, mild forward head, increased upper thoracic kyphosis  PALPATION: Tender spinous processes C 2 C 3, posterior spinal lig  Decreased jt mobility noted lateral glides pronounced C 2,3,4.   CERVICAL ROM:   Active ROM A/PROM (deg) eval  Flexion wfl  Extension 50%  Right lateral flexion 45%  Left lateral flexion 45%  Right rotation 75%  Left rotation 50%   (Blank rows = not tested) THORACIC ROM:  rotation,extension , flexion all wfl , non provoking UPPER EXTREMITY ROM:  Active ROM Right eval Left eval  Shoulder flexion All wfl All wfl  Shoulder extension    Shoulder abduction    Shoulder  adduction    Shoulder extension    Shoulder internal rotation    Shoulder external rotation  Elbow flexion    Elbow extension    Wrist flexion    Wrist extension    Wrist ulnar deviation    Wrist radial deviation    Wrist pronation    Wrist supination     (Blank rows = not tested)  UPPER EXTREMITY MMT:  MMT Right eval Left eval  Shoulder flexion All wfl All wfl  Shoulder extension    Shoulder abduction    Shoulder adduction    Shoulder extension    Shoulder internal rotation    Shoulder external rotation    Middle trapezius    Lower trapezius    Elbow flexion    Elbow extension    Wrist flexion    Wrist extension    Wrist ulnar deviation    Wrist radial deviation    Wrist pronation    Wrist supination    Grip strength     (Blank rows = not tested)  CERVICAL SPECIAL TESTS:  Cranial cervical flexion test: L rotation 20 %, no Sx provocation, Upper limb tension test (ULTT): Negative, and Distraction test: Negative Seated vestibular artery test - B  FUNCTIONAL TESTS:  na  TODAY'S TREATMENT:                                                                                                                              DATE:  12/25/22:   Supine for manual stretching of B upper traps, levator scapulae, and intermittent manual cervical traction, contract/ relax for upper cervical rotation.    Therex:  instructed in shoulder  rows/ scapular retractions with t band placed over door, also over door hinge for mid scapular rows.  Patient is continuing with pec door frame stretches and the theraband B shoulder Er.   12/19/22:  Manual:  positioned the patient supine for alternating bouts of manual cervical distraction, alternating with L upper traps, L levator stretching.  Also contract relax in flexed cervical position, for upper cervical L rotation, and supine upper cervical flexion with isometric resisted upper cervical extension.  All 2 bouts each, 6 reps, 3 to 5 sec holds.     Therex:  reviewed the cervical snags ex for L cervical rotation, with towel, corrected positioning and direction with R hand Added 1.  supine isometric chin tucks 5 sec holds, 10 reps 2.  Supine isometric chin tuck with B shoulder theraband ER, black band 3.  Pecs/ anterior chest wall stretch in door frame.      12/09/22 Evaluation and manual techniques:  Upper cervical contract /relax for L rotation, 2 bouts, 5 sec holds, with improvement noted in active L rotation  PATIENT EDUCATION:  Education details: educated in [place], home program, anatomy of c spine, purpose/rationale for therex Person educated: Patient Education method: Consulting civil engineer, Media planner, Verbal cues, and Handouts Education comprehension: returned demonstration, verbal cues required, and needs further education  HOME EXERCISE PROGRAM: Access Code: [email or web address] URL: [email or web address] Date: 12/09/2022 Prepared by: Hayden Pedro  Exercises -  Seated Assisted Cervical Rotation with Towel  - 1 x daily - 7 x weekly - 3 sets - 10 reps - Cervical Extension AROM with Strap  - 1 x daily - 7 x weekly - 3 sets - 10 reps Access Code: [email or web address] URL: [email or web address] Date: 12/19/2022 Prepared by: Hayden Pedro  Exercises - Doorway Pec Stretch at 90 Degrees Abduction  - 2 x daily - 7 x weekly - 1 sets - 10 reps - 5 sec  hold - Supine Cervical Retraction with Towel  - 2 x daily - 7 x weekly - 1 sets - 10 reps - 5 hold - Shoulder External Rotation and Scapular Retraction with Resistance  - 2 x daily - 7 x weekly - 1 sets - 10 reps  ASSESSMENT:  CLINICAL IMPRESSION: Patient is a 72 y.o. male who was seen today for physical therapy treatment for upper cervical spine stiffness.   He reports consistent ongoing symptoms of dizziness provoked by any cervical spine extension.  Still sleeping propped on 3 pillows, lower than that and he gets a headache.  He is I with his exercises as well, has the theraband.   We stopped the chin tucks today due to brings on dizziness.  At this point referring him back to referring MD for further imaging or specialist evaluation to determine source of dizziness. OBJECTIVE IMPAIRMENTS: decreased ROM, dizziness, hypomobility, impaired flexibility, and postural dysfunction.   ACTIVITY LIMITATIONS: lifting, sleeping, bed mobility, and reach over head  PARTICIPATION LIMITATIONS:  na  PERSONAL FACTORS: Fitness and 1-2 comorbidities: DM, HTN  are also affecting patient's functional outcome.   REHAB POTENTIAL: Good  CLINICAL DECISION MAKING: Stable/uncomplicated  EVALUATION COMPLEXITY: Low   GOALS: Goals reviewed with patient? Yes  SHORT TERM GOALS: Target date:12/23/22      I HEP for managing cervical spine stiffness Baseline: initiated today, at eval Goal status: IN PROGRESS  LONG TERM GOALS: Target date: 03/03/23  FOTO score improve from 67 to 80%  Baseline: 67 Goal status: INITIAL  2.  Improve cervical spine AROM L rotation to 65% or greater Baseline: 50% Goal status: IN PROGRESS, some improvement  3. Complete  Resolution of dizziness with cervical extension Baseline: consistent dizziness with terminal cervical extension motion Goal status: IN PROGRESS no improvement, still consistent   PLAN:  PT FREQUENCY: 1x/week  PT DURATION: 12 weeks  PLANNED INTERVENTIONS: Therapeutic exercises, Therapeutic activity, Neuromuscular re-education, Balance training, Gait training, Patient/Family education, Self Care, Joint mobilization, Dry Needling, Spinal mobilization, Cryotherapy, Moist heat, Taping, Traction, Manual therapy, and Re-evaluation  PLAN FOR NEXT SESSION: send back to referring physician Jennesis Ramaswamy L Kendric Sindelar, PT 12/25/2022, 3:47 PM

## 2022-12-26 ENCOUNTER — Other Ambulatory Visit: Payer: Self-pay

## 2022-12-26 DIAGNOSIS — E1159 Type 2 diabetes mellitus with other circulatory complications: Secondary | ICD-10-CM

## 2022-12-26 MED ORDER — ONETOUCH VERIO VI STRP: ORAL_STRIP | 3 refills | Status: DC

## 2023-01-01 ENCOUNTER — Ambulatory Visit: Payer: 59 | Admitting: Physical Therapy

## 2023-01-08 ENCOUNTER — Ambulatory Visit: Payer: 59

## 2023-01-10 ENCOUNTER — Ambulatory Visit (INDEPENDENT_AMBULATORY_CARE_PROVIDER_SITE_OTHER): Payer: 59 | Admitting: Orthopaedic Surgery

## 2023-01-10 VITALS — BP 150/97 | HR 69 | Ht 67.0 in | Wt 193.0 lb

## 2023-01-10 DIAGNOSIS — M47812 Spondylosis without myelopathy or radiculopathy, cervical region: Secondary | ICD-10-CM | POA: Diagnosis not present

## 2023-01-10 NOTE — Progress Notes (Unsigned)
Office Visit Note   Patient: [name] [name]           Date of Birth: May 03, 1951           MRN: [id number] Visit Date: 01/10/2023              Requested by: Isaac Bliss, Rayford Halsted, MD [place],  [place] [id number] PCP: Isaac Bliss, Rayford Halsted, MD   Assessment & Plan: Visit Diagnoses: No diagnosis found.  Plan: ***  Follow-Up Instructions: No follow-ups on file.   Orders:  No orders of the defined types were placed in this encounter.  No orders of the defined types were placed in this encounter.     Procedures: No procedures performed   Clinical Data: No additional findings.   Subjective: Chief Complaint  Patient presents with   Neck - Pain, Follow-up    HPI  Review of Systems   Objective: Vital Signs: BP (!) 150/97   Pulse 69   Ht '5\' 7"'$  (1.702 m)   Wt 193 lb (87.5 kg)   BMI 30.23 kg/m   Physical Exam  Ortho Exam  Specialty Comments:  No specialty comments available.  Imaging: No results found.   PMFS History: Patient Active Problem List   Diagnosis Date Noted   Hyperlipidemia associated with type 2 diabetes mellitus ([place]) 10/11/2022   History of angiodysplasia of intestinal tract    Microcytic anemia 01/14/2022   Low testosterone 11/21/2017   S/P PTCA (percutaneous transluminal coronary angioplasty) 11/18/2015   Coronary artery disease due to lipid rich plaque 11/18/2015   Elevated LFTs 11/18/2015   Wellness examination 10/18/2015   Smoker 10/18/2015   HNP (herniated nucleus pulposus), lumbar 05/18/2014   Hypercalcemia 09/29/2013   NASH (nonalcoholic steatohepatitis) 08/28/2012   Screening for prostate cancer 08/28/2012   Hypogonadism male 08/28/2012   Testosterone deficiency in male 07/22/2011   Fatigue 07/05/2011   RBBB 10/12/2010   HEMORRHOIDS-INTERNAL 06/18/2010   ARTHRALGIA 01/18/2010   TOBACCO USER 07/11/2009   POSTURAL LIGHTHEADEDNESS 07/11/2009   DIVERTICULOSIS, COLON 11/18/2008   BACK PAIN,  LUMBAR 09/01/2008   SWEATING 07/26/2008   Diabetes mellitus type II, controlled ([place]) 01/20/2008   COUGH 01/20/2008   Dyslipidemia 06/02/2007   Essential hypertension 06/02/2007   Coronary atherosclerosis 06/02/2007   GERD 06/02/2007   Past Medical History:  Diagnosis Date   Anemia, iron deficiency 11/18/2008   Resolved, despite no rx--2012     ANEMIA-IRON DEFICIENCY    Arthritis    BACK PAIN, LUMBAR    Blood transfusion without reported diagnosis    CORONARY ARTERY DISEASE    DIVERTICULOSIS, COLON    Elevated LFTs 11/18/2015   GERD    Hepatitis    States he was tested for Hep B and he had antibodies   HYPERLIPIDEMIA    HYPERTENSION    dr tom wall   Nephrolithiasis    PONV (postoperative nausea and vomiting)    little nausea after having his hip replacement   Type 2 diabetes mellitus ([facility])    Unspecified disorder of urethra and urinary tract     Family History  Problem Relation Age of Onset   Colon cancer Mother        Colon Cancer-at advanced age- in her 83's per pt    Heart disease Father    Breast cancer Sister    Esophageal cancer Neg Hx    Rectal cancer Neg Hx    Stomach cancer Neg Hx     Past  Surgical History:  Procedure Laterality Date   COLONOSCOPY     CORONARY ANGIOPLASTY WITH STENT PLACEMENT     06    dr t. wall   ELECTROCARDIOGRAM  03/30/2007   ENTEROSCOPY N/A 04/30/2022   Procedure: ENTEROSCOPY;  Surgeon: Gatha Mayer, MD;  Location: [facility];  Service: Gastroenterology;  Laterality: N/A;   ESOPHAGOGASTRODUODENOSCOPY  11/07/2005   kidney stones     in office procedure to remove but no sedation surgeries for kidney stones    LUMBAR LAMINECTOMY Right 05/18/2014   Procedure: LUMBAR LAMINECTOMY Microdiscectomy Right Lumbar five - sacral one;  Surgeon: [name], MD;  Location: [place];  Service: Orthopedics;  Laterality: Right;   Stress Cardiolite  11/30/2004   SUBMUCOSAL TATTOO INJECTION  04/30/2022   Procedure: SUBMUCOSAL TATTOO INJECTION;   Surgeon: Gatha Mayer, MD;  Location: [facility];  Service: Gastroenterology;;   TOTAL HIP ARTHROPLASTY  10/28/2012   Procedure: TOTAL HIP ARTHROPLASTY;  Surgeon: [name], MD;  Location: [place];  Service: Orthopedics;  Laterality: Right;   UPPER GASTROINTESTINAL ENDOSCOPY     Social History   Occupational History   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  Vaping Use   Vaping Use: Never used  Substance and Sexual Activity   Alcohol use: Yes    Alcohol/week: 0.0 standard drinks of alcohol    Comment: rare   Drug use: No   Sexual activity: Not Currently

## 2023-01-13 ENCOUNTER — Ambulatory Visit (INDEPENDENT_AMBULATORY_CARE_PROVIDER_SITE_OTHER): Payer: 59 | Admitting: Internal Medicine

## 2023-01-13 ENCOUNTER — Encounter: Payer: Self-pay | Admitting: Internal Medicine

## 2023-01-13 VITALS — BP 110/80 | HR 77 | Temp 98.0°F | Ht 67.0 in | Wt 193.1 lb

## 2023-01-13 DIAGNOSIS — E785 Hyperlipidemia, unspecified: Secondary | ICD-10-CM

## 2023-01-13 DIAGNOSIS — R7989 Other specified abnormal findings of blood chemistry: Secondary | ICD-10-CM | POA: Diagnosis not present

## 2023-01-13 DIAGNOSIS — M47812 Spondylosis without myelopathy or radiculopathy, cervical region: Secondary | ICD-10-CM | POA: Insufficient documentation

## 2023-01-13 DIAGNOSIS — E119 Type 2 diabetes mellitus without complications: Secondary | ICD-10-CM | POA: Diagnosis not present

## 2023-01-13 DIAGNOSIS — R5383 Other fatigue: Secondary | ICD-10-CM

## 2023-01-13 DIAGNOSIS — E291 Testicular hypofunction: Secondary | ICD-10-CM

## 2023-01-13 DIAGNOSIS — E1169 Type 2 diabetes mellitus with other specified complication: Secondary | ICD-10-CM

## 2023-01-13 DIAGNOSIS — Z0000 Encounter for general adult medical examination without abnormal findings: Secondary | ICD-10-CM | POA: Diagnosis not present

## 2023-01-13 DIAGNOSIS — Z125 Encounter for screening for malignant neoplasm of prostate: Secondary | ICD-10-CM

## 2023-01-13 NOTE — Progress Notes (Signed)
Established Patient Office Visit     CC/Reason for Visit: Annual preventive exam and subsequent Medicare wellness visit  HPI: [name] [name] is a 72 y.o. male who is coming in today for the above mentioned reasons. Past Medical History is significant for: Coronary artery disease followed by cardiology, hypertension, hyperlipidemia, type 2 diabetes, ongoing tobacco use and testosterone deficiency.  He is feeling well today.  He has routine eye and dental care.   Past Medical/Surgical History: Past Medical History:  Diagnosis Date   Anemia, iron deficiency 11/18/2008   Resolved, despite no rx--2012     ANEMIA-IRON DEFICIENCY    Arthritis    BACK PAIN, LUMBAR    Blood transfusion without reported diagnosis    CORONARY ARTERY DISEASE    DIVERTICULOSIS, COLON    Elevated LFTs 11/18/2015   GERD    Hepatitis    States he was tested for Hep B and he had antibodies   HYPERLIPIDEMIA    HYPERTENSION    dr tom wall   Nephrolithiasis    PONV (postoperative nausea and vomiting)    little nausea after having his hip replacement   Type 2 diabetes mellitus ([place])    Unspecified disorder of urethra and urinary tract     Past Surgical History:  Procedure Laterality Date   COLONOSCOPY     CORONARY ANGIOPLASTY WITH STENT PLACEMENT     06    dr t. wall   ELECTROCARDIOGRAM  03/30/2007   ENTEROSCOPY N/A 04/30/2022   Procedure: ENTEROSCOPY;  Surgeon: Gatha Mayer, MD;  Location: [facility];  Service: Gastroenterology;  Laterality: N/A;   ESOPHAGOGASTRODUODENOSCOPY  11/07/2005   kidney stones     in office procedure to remove but no sedation surgeries for kidney stones    LUMBAR LAMINECTOMY Right 05/18/2014   Procedure: LUMBAR LAMINECTOMY Microdiscectomy Right Lumbar five - sacral one;  Surgeon: [name], MD;  Location: [facility];  Service: Orthopedics;  Laterality: Right;   Stress Cardiolite  11/30/2004   SUBMUCOSAL TATTOO INJECTION  04/30/2022   Procedure: SUBMUCOSAL TATTOO  INJECTION;  Surgeon: Gatha Mayer, MD;  Location: [facility];  Service: Gastroenterology;;   TOTAL HIP ARTHROPLASTY  10/28/2012   Procedure: TOTAL HIP ARTHROPLASTY;  Surgeon: [name], MD;  Location: [facility];  Service: Orthopedics;  Laterality: Right;   UPPER GASTROINTESTINAL ENDOSCOPY      Social History:  reports that he has been smoking cigarettes. He has a 48.00 pack-year smoking history. He has never used smokeless tobacco. He reports current alcohol use. He reports that he does not use drugs.  Allergies: Allergies  Allergen Reactions   Actos [Pioglitazone Hydrochloride]     Weight gain    Family History:  Family History  Problem Relation Age of Onset   Colon cancer Mother        Colon Cancer-at advanced age- in her 75's per pt    Heart disease Father    Breast cancer Sister    Esophageal cancer Neg Hx    Rectal cancer Neg Hx    Stomach cancer Neg Hx      Current Outpatient Medications:    Ascorbic Acid (VITAMIN C) 1000 MG tablet, Take 2,000 mg by mouth daily., Disp: , Rfl:    aspirin EC 81 MG tablet, Take 1 tablet (81 mg total) by mouth daily., Disp: 30 tablet, Rfl: 11   atenolol (TENORMIN) 25 MG tablet, Take 1 tablet (25 mg total) by mouth daily., Disp: 90 tablet,  Rfl: 3   b complex vitamins tablet, Take 1 tablet by mouth daily., Disp: , Rfl:    Biotin 5000 MCG TABS, Take 5,000 mcg by mouth daily., Disp: , Rfl:    Cholecalciferol (VITAMIN D) 2000 UNITS tablet, Take 2,000 Units by mouth daily., Disp: , Rfl:    Coenzyme Q10 (CO Q 10) 100 MG CAPS, Take 200 mg by mouth daily. , Disp: , Rfl:    Cyanocobalamin (VITAMIN B-12 PO), Take 1 tablet by mouth daily., Disp: , Rfl:    Evolocumab (REPATHA SURECLICK) [id number] MG/ML SOAJ, Inject 1 pen  into the skin every 14 (fourteen) days., Disp: 6 mL, Rfl: 3   Ginkgo Biloba (GINKOBA PO), Take 1 tablet by mouth daily., Disp: , Rfl:    glucose blood (ONETOUCH VERIO) test strip, USE AS DIRECTED to check blood sugar 1X daily, Disp:  100 strip, Rfl: 3   icosapent Ethyl (VASCEPA) 1 g capsule, Take 2 capsules (2 g total) by mouth 2 (two) times daily., Disp: 360 capsule, Rfl: 3   lisinopril (ZESTRIL) 40 MG tablet, Take 1 tablet (40 mg total) by mouth daily., Disp: 90 tablet, Rfl: 3   Magnesium 400 MG CAPS, Take 400 mg by mouth daily. , Disp: , Rfl:    metFORMIN (GLUCOPHAGE-XR) 500 MG 24 hr tablet, TAKE 2 TABLETS BY MOUTH EVERY DAY, Disp: 180 tablet, Rfl: 3   methocarbamol (ROBAXIN) 500 MG tablet, Take 1 tablet (500 mg total) by mouth every 8 (eight) hours as needed for muscle spasms., Disp: 300 tablet, Rfl: 0   Multiple Vitamin (MULTIVITAMIN) tablet, Take 1 tablet by mouth daily., Disp: , Rfl:    nitroGLYCERIN (NITROSTAT) 0.4 MG SL tablet, Place 1 tablet (0.4 mg total) under the tongue every 5 (five) minutes as needed for chest pain., Disp: 30 tablet, Rfl: 11   pantoprazole (PROTONIX) 40 MG tablet, TAKE 1 TABLET BY MOUTH EVERY DAY, Disp: 90 tablet, Rfl: 3   rosuvastatin (CRESTOR) 5 MG tablet, Take 0.5 tablets (2.5 mg total) by mouth daily., Disp: 45 tablet, Rfl: 1   RYBELSUS 14 MG TABS, TAKE 1 TABLET BY MOUTH EVERY DAY, Disp: 90 tablet, Rfl: 3   testosterone (ANDROGEL) 50 MG/5GM (1%) GEL, APPLY 50 MG (ONE PACKET) TOPICALLY DAILY., Disp: 150 g, Rfl: 2   Zinc 50 MG TABS, Take 50 mg by mouth daily., Disp: , Rfl:  No current facility-administered medications for this visit.  Facility-Administered Medications Ordered in Other Visits:    regadenoson (LEXISCAN) injection SOLN 0.4 mg, 0.4 mg, Intravenous, Once, Donato Heinz, MD  Review of Systems:  Negative unless indicated in HPI.   Physical Exam: Vitals:   01/13/23 1317  BP: 110/80  Pulse: 77  Temp: 98 F (36.7 C)  TempSrc: Oral  SpO2: 94%  Weight: 193 lb 1.6 oz (87.6 kg)  Height: '5\' 7"'$  (1.702 m)    Body mass index is 30.24 kg/m.   Physical Exam Vitals reviewed.  Constitutional:      General: He is not in acute distress.    Appearance: Normal  appearance. He is not ill-appearing, toxic-appearing or diaphoretic.  HENT:     Head: Normocephalic.     Right Ear: Tympanic membrane, ear canal and external ear normal. There is no impacted cerumen.     Left Ear: Tympanic membrane, ear canal and external ear normal. There is no impacted cerumen.     Nose: Nose normal.     Mouth/Throat:     Mouth: Mucous membranes are moist.  Pharynx: Oropharynx is clear. No oropharyngeal exudate or posterior oropharyngeal erythema.  Eyes:     General: No scleral icterus.       Right eye: No discharge.        Left eye: No discharge.     Conjunctiva/sclera: Conjunctivae normal.     Pupils: Pupils are equal, round, and reactive to light.  Neck:     Vascular: No carotid bruit.  Cardiovascular:     Rate and Rhythm: Normal rate and regular rhythm.     Pulses: Normal pulses.     Heart sounds: Normal heart sounds.  Pulmonary:     Effort: Pulmonary effort is normal. No respiratory distress.     Breath sounds: Normal breath sounds.  Abdominal:     General: Abdomen is flat. Bowel sounds are normal.     Palpations: Abdomen is soft.  Musculoskeletal:        General: Normal range of motion.     Cervical back: Normal range of motion.  Skin:    General: Skin is warm and dry.  Neurological:     General: No focal deficit present.     Mental Status: He is alert and oriented to person, place, and time. Mental status is at baseline.  Psychiatric:        Mood and Affect: Mood normal.        Behavior: Behavior normal.        Thought Content: Thought content normal.        Judgment: Judgment normal.      Subsequent Medicare wellness visit   1. Risk factors, based on past  M,S,F - Cardiac Risk Factors include: diabetes mellitus;advanced age (>75mn, >>55women);sedentary lifestyle   2.  Physical activities: Dietary issues and exercise activities discussed:  Current Exercise Habits: The patient does not participate in regular exercise at present, Exercise  limited by: cardiac condition(s)   3.  Depression/mood:  [place] [facility]        4.  ADL's:    01/13/2023    1:07 PM  In your present state of health, do you have any difficulty performing the following activities:  Hearing? 1  Comment a little bit  Vision? 0  Difficulty concentrating or making decisions? 0  Walking or climbing stairs? 0  Dressing or bathing? 0  Doing errands, shopping? 0  Preparing Food and eating ? N  Using the Toilet? N  In the past six months, have you accidently leaked urine? N  Do you have problems with loss of bowel control? N  Managing your Medications? N  Managing your Finances? N  Housekeeping or managing your Housekeeping? N     5.  Fall risk:     12/23/2019    1:23 PM 01/09/2021    1:30 PM 01/10/2022    1:18 PM 04/30/2022    9:39 AM 01/13/2023    1:10 PM  Fall Risk  Falls in the past year? 0 0 0  0  Was there an injury with Fall? 0 0 0  0  Fall Risk Category Calculator 0 0 0  0  Fall Risk Category (Retired) Low Low Low    (RETIRED) Patient Fall Risk Level    High fall risk   (RETIRED) Patient Fall Risk Level - Comments    sedation today   Fall risk Follow up   Falls evaluation completed  Falls evaluation completed  6.  Home safety: No problems identified   7.  Height weight, and visual acuity: height and weight as above, vision/hearing: Vision Screening   Right eye Left eye Both eyes  Without correction     With correction '20/20 20/20 20/20 '$     8.  Counseling: Ready to quit: Not Answered Counseling given: Not Answered Tobacco comments: states he wants to reduce his dependence.    9. Lab orders based on risk factors: Laboratory update will be reviewed   10. Cognitive assessment:        01/13/2023    1:11 PM  6CIT Screen  What Year? 0 points  What month? 0 points  What time? 0 points  Count back from 20 0 points  Months in reverse 0  points  Repeat phrase 0 points  Total Score 0 points     11. Screening: Patient provided with a written and personalized 5-10 year screening schedule in the AVS. Health Maintenance  Topic Date Due   COVID-19 Vaccine (6 - 2023-24 season) 07/12/2022   Yearly kidney function blood test for diabetes  01/11/2023   Complete foot exam   01/11/2023   Screening for Lung Cancer  01/30/2023   Eye exam for diabetics  01/13/2023*   Hemoglobin A1C  04/12/2023   Yearly kidney health urinalysis for diabetes  08/03/2023   Medicare Annual Wellness Visit  01/13/2024   DTaP/Tdap/Td vaccine (3 - Td or Tdap) 10/14/2024   Pneumonia Vaccine  Completed   Flu Shot  Completed   Hepatitis C Screening: USPSTF Recommendation to screen - Ages 59-79 yo.  Completed   Zoster (Shingles) Vaccine  Completed   HPV Vaccine  Aged Out   Colon Cancer Screening  Discontinued  *Topic was postponed. The date shown is not the original due date.    12. Provider List Update: Patient Care Team    Relationship Specialty Notifications Start End  Isaac Bliss, Rayford Halsted, MD PCP - General Internal Medicine  11/20/18   Freada Bergeron, MD PCP - Cardiology Cardiology  09/18/21   Renella Cunas, MD (Inactive)  Cardiology  06/10/12   Magdalen Spatz, NP Nurse Practitioner Pulmonary Disease  12/03/18   Dorothy Spark, MD Consulting Physician Cardiology  12/03/18   Viona Gilmore, [facility] (Inactive) Pharmacist Pharmacist  10/19/20    Comment: Phone: [phone]  Macarthur Critchley, [place] Referring Physician Optometry  01/10/22      13. Advance Directives: Does Patient Have a Medical Advance Directive?: Yes Type of Advance Directive: Healthcare Power of Attorney, Living will, Out of facility DNR (pink MOST or yellow form) Copy of [place] in Chart?: No - copy requested  14. Opioids: Patient is not on any opioid prescriptions and has no risk factors for a substance use disorder.   15.   Goals      Patient Stated      Reduce cigarette use for transition to senior apartments.       Weight (lb) < 190 lb (86.2 kg)         I have personally reviewed and noted the following in the patient's chart:   Medical and social history Use of alcohol, tobacco or illicit drugs  Current medications and supplements Functional ability and status Nutritional status Physical activity Advanced directives List of other physicians Hospitalizations, surgeries, and ER visits in previous 12 months Vitals Screenings to include cognitive, depression, and falls Referrals and appointments  In addition, I have reviewed and discussed with patient  certain preventive protocols, quality metrics, and best practice recommendations. A written personalized care plan for preventive services as well as general preventive health recommendations were provided to patient.  Impression and Plan:  Controlled type 2 diabetes mellitus without complication, without long-term current use of insulin ([place]) - Plan: CBC with Differential/Platelet, Comprehensive metabolic panel, Microalbumin/Creatinine Ratio, Urine, Urinalysis  Dyslipidemia - Plan: Lipid panel  Fatigue, unspecified type  Testosterone deficiency in male  Elevated LFTs  Hyperlipidemia associated with type 2 diabetes mellitus ([place]) - Plan: Hemoglobin A1c  Medicare annual wellness visit, subsequent  Prostate cancer screening - Plan: PSA  -Recommend routine eye and dental care. -Healthy lifestyle discussed in detail. -Labs to be updated today. -Prostate cancer screening: PSA today Health Maintenance  Topic Date Due   COVID-19 Vaccine (6 - 2023-24 season) 07/12/2022   Yearly kidney function blood test for diabetes  01/11/2023   Complete foot exam   01/11/2023   Screening for Lung Cancer  01/30/2023   Eye exam for diabetics  01/13/2023*   Hemoglobin A1C  04/12/2023   Yearly kidney health urinalysis for diabetes  08/03/2023   Medicare Annual Wellness Visit  01/13/2024    DTaP/Tdap/Td vaccine (3 - Td or Tdap) 10/14/2024   Pneumonia Vaccine  Completed   Flu Shot  Completed   Hepatitis C Screening: USPSTF Recommendation to screen - Ages 80-79 yo.  Completed   Zoster (Shingles) Vaccine  Completed   HPV Vaccine  Aged Out   Colon Cancer Screening  Discontinued  *Topic was postponed. The date shown is not the original due date.    Tobacco Use: High Risk (01/13/2023)   Patient History    Smoking Tobacco Use: Every Day    Smokeless Tobacco Use: Never    Passive Exposure: Not on file   -I have discussed tobacco cessation with the patient.  I have counseled the patient regarding the negative impacts of continued tobacco use including but not limited to lung cancer, COPD, and cardiovascular disease.  I have discussed alternatives to tobacco and modalities that may help facilitate tobacco cessation including but not limited to biofeedback, hypnosis, and medications.  Total time spent with tobacco counseling was 4 minutes.     Lelon Frohlich, MD [facility]

## 2023-01-14 ENCOUNTER — Other Ambulatory Visit (INDEPENDENT_AMBULATORY_CARE_PROVIDER_SITE_OTHER): Payer: 59

## 2023-01-14 DIAGNOSIS — E1169 Type 2 diabetes mellitus with other specified complication: Secondary | ICD-10-CM

## 2023-01-14 DIAGNOSIS — Z125 Encounter for screening for malignant neoplasm of prostate: Secondary | ICD-10-CM

## 2023-01-14 DIAGNOSIS — E119 Type 2 diabetes mellitus without complications: Secondary | ICD-10-CM

## 2023-01-14 DIAGNOSIS — E785 Hyperlipidemia, unspecified: Secondary | ICD-10-CM

## 2023-01-14 LAB — CBC WITH DIFFERENTIAL/PLATELET
Basophils Absolute: 0.1 10*3/uL (ref 0.0–0.1)
Basophils Relative: 0.9 % (ref 0.0–3.0)
Eosinophils Absolute: 0.1 10*3/uL (ref 0.0–0.7)
Eosinophils Relative: 1.2 % (ref 0.0–5.0)
HCT: 50.6 % (ref 39.0–52.0)
Hemoglobin: 17.5 g/dL — ABNORMAL HIGH (ref 13.0–17.0)
Lymphocytes Relative: 21 % (ref 12.0–46.0)
Lymphs Abs: 1.7 10*3/uL (ref 0.7–4.0)
MCHC: 34.6 g/dL (ref 30.0–36.0)
MCV: 96.7 fl (ref 78.0–100.0)
Monocytes Absolute: 0.7 10*3/uL (ref 0.1–1.0)
Monocytes Relative: 8.3 % (ref 3.0–12.0)
Neutro Abs: 5.6 10*3/uL (ref 1.4–7.7)
Neutrophils Relative %: 68.6 % (ref 43.0–77.0)
Platelets: 248 10*3/uL (ref 150.0–400.0)
RBC: 5.23 Mil/uL (ref 4.22–5.81)
RDW: 14.3 % (ref 11.5–15.5)
WBC: 8.2 10*3/uL (ref 4.0–10.5)

## 2023-01-14 LAB — MICROALBUMIN / CREATININE URINE RATIO
Creatinine,U: 45.7 mg/dL
Microalb Creat Ratio: 1.5 mg/g (ref 0.0–30.0)
Microalb, Ur: <0.7 mg/dL (ref 0.0–1.9)

## 2023-01-14 LAB — COMPREHENSIVE METABOLIC PANEL
ALT: 66 U/L — ABNORMAL HIGH (ref 0–53)
AST: 38 U/L — ABNORMAL HIGH (ref 0–37)
Albumin: 4.4 g/dL (ref 3.5–5.2)
Alkaline Phosphatase: 56 U/L (ref 39–117)
BUN: 16 mg/dL (ref 6–23)
CO2: 26 mEq/L (ref 19–32)
Calcium: 10.6 mg/dL — ABNORMAL HIGH (ref 8.4–10.5)
Chloride: 106 mEq/L (ref 96–112)
Creatinine, Ser: 0.85 mg/dL (ref 0.40–1.50)
GFR: 87.88 mL/min (ref >60.00)
Glucose, Bld: 106 mg/dL — ABNORMAL HIGH (ref 70–99)
Potassium: 4.6 mEq/L (ref 3.5–5.1)
Sodium: 144 mEq/L (ref 135–145)
Total Bilirubin: 0.4 mg/dL (ref 0.2–1.2)
Total Protein: 6.9 g/dL (ref 6.0–8.3)

## 2023-01-14 LAB — URINALYSIS
Bilirubin Urine: NEGATIVE
Hgb urine dipstick: NEGATIVE
Ketones, ur: NEGATIVE
Leukocytes,Ua: NEGATIVE
Nitrite: NEGATIVE
Specific Gravity, Urine: 1.015 (ref 1.000–1.030)
Total Protein, Urine: NEGATIVE
Urine Glucose: >=1000 — AB
Urobilinogen, UA: 0.2 (ref 0.0–1.0)
pH: 7 (ref 5.0–8.0)

## 2023-01-14 LAB — LIPID PANEL
Cholesterol: 96 mg/dL (ref 0–200)
HDL: 52.3 mg/dL (ref >39.00)
NonHDL: 43.71
Total CHOL/HDL Ratio: 2
Triglycerides: 222 mg/dL — ABNORMAL HIGH (ref 0.0–149.0)
VLDL: 44.4 mg/dL — ABNORMAL HIGH (ref 0.0–40.0)

## 2023-01-14 LAB — HEMOGLOBIN A1C: Hgb A1c MFr Bld: 6.2 % (ref 4.6–6.5)

## 2023-01-14 LAB — LDL CHOLESTEROL, DIRECT: Direct LDL: 26 mg/dL

## 2023-01-14 LAB — PSA: PSA: 0.48 ng/mL (ref 0.10–4.00)

## 2023-01-15 ENCOUNTER — Other Ambulatory Visit: Payer: Self-pay | Admitting: Internal Medicine

## 2023-01-15 DIAGNOSIS — R7989 Other specified abnormal findings of blood chemistry: Secondary | ICD-10-CM

## 2023-01-30 ENCOUNTER — Ambulatory Visit
Admission: RE | Admit: 2023-01-30 | Discharge: 2023-01-30 | Disposition: A | Discharge status: Home / Self Care | Payer: 59 | Source: Ambulatory Visit | Attending: Internal Medicine | Admitting: Internal Medicine

## 2023-01-30 DIAGNOSIS — I2510 Atherosclerotic heart disease of native coronary artery without angina pectoris: Secondary | ICD-10-CM | POA: Diagnosis not present

## 2023-01-30 DIAGNOSIS — F17210 Nicotine dependence, cigarettes, uncomplicated: Secondary | ICD-10-CM

## 2023-01-30 DIAGNOSIS — J432 Centrilobular emphysema: Secondary | ICD-10-CM | POA: Diagnosis not present

## 2023-01-30 DIAGNOSIS — Z87891 Personal history of nicotine dependence: Secondary | ICD-10-CM

## 2023-01-30 DIAGNOSIS — J9811 Atelectasis: Secondary | ICD-10-CM | POA: Diagnosis not present

## 2023-02-03 ENCOUNTER — Other Ambulatory Visit: Payer: Self-pay | Admitting: Acute Care

## 2023-02-03 DIAGNOSIS — Z87891 Personal history of nicotine dependence: Secondary | ICD-10-CM

## 2023-02-03 DIAGNOSIS — F17210 Nicotine dependence, cigarettes, uncomplicated: Secondary | ICD-10-CM

## 2023-02-03 DIAGNOSIS — Z122 Encounter for screening for malignant neoplasm of respiratory organs: Secondary | ICD-10-CM

## 2023-02-18 ENCOUNTER — Other Ambulatory Visit: Payer: Self-pay | Admitting: *Deleted

## 2023-02-18 DIAGNOSIS — E785 Hyperlipidemia, unspecified: Secondary | ICD-10-CM

## 2023-02-18 MED ORDER — ROSUVASTATIN CALCIUM 5 MG PO TABS: 2.5000 mg | ORAL_TABLET | Freq: Every day | ORAL | 3 refills | Status: DC

## 2023-02-26 ENCOUNTER — Other Ambulatory Visit: Payer: Self-pay | Admitting: Internal Medicine

## 2023-02-26 ENCOUNTER — Telehealth: Payer: Self-pay | Admitting: Internal Medicine

## 2023-02-26 DIAGNOSIS — R7989 Other specified abnormal findings of blood chemistry: Secondary | ICD-10-CM

## 2023-02-26 NOTE — Telephone Encounter (Signed)
Pt called to see if Dr. Ardyth Harps were able to look at his low dose CT scan that he gets yearly. He had it done on 01/30/23.   If so can he get a call to review the results with him?  Please advise.

## 2023-02-27 ENCOUNTER — Other Ambulatory Visit (INDEPENDENT_AMBULATORY_CARE_PROVIDER_SITE_OTHER): Payer: 59

## 2023-02-27 DIAGNOSIS — R7989 Other specified abnormal findings of blood chemistry: Secondary | ICD-10-CM | POA: Diagnosis not present

## 2023-02-27 LAB — COMPREHENSIVE METABOLIC PANEL
ALT: 75 U/L — ABNORMAL HIGH (ref 0–53)
AST: 48 U/L — ABNORMAL HIGH (ref 0–37)
Albumin: 4.7 g/dL (ref 3.5–5.2)
Alkaline Phosphatase: 52 U/L (ref 39–117)
BUN: 17 mg/dL (ref 6–23)
CO2: 25 mEq/L (ref 19–32)
Calcium: 10 mg/dL (ref 8.4–10.5)
Chloride: 106 mEq/L (ref 96–112)
Creatinine, Ser: 0.87 mg/dL (ref 0.40–1.50)
GFR: 87.19 mL/min (ref >60.00)
Glucose, Bld: 100 mg/dL — ABNORMAL HIGH (ref 70–99)
Potassium: 4.4 mEq/L (ref 3.5–5.1)
Sodium: 142 mEq/L (ref 135–145)
Total Bilirubin: 0.4 mg/dL (ref 0.2–1.2)
Total Protein: 7.5 g/dL (ref 6.0–8.3)

## 2023-02-27 NOTE — Telephone Encounter (Signed)
Patient is aware 

## 2023-03-06 ENCOUNTER — Other Ambulatory Visit: Payer: Self-pay | Admitting: Internal Medicine

## 2023-03-06 DIAGNOSIS — R7989 Other specified abnormal findings of blood chemistry: Secondary | ICD-10-CM

## 2023-03-18 ENCOUNTER — Other Ambulatory Visit: Payer: Self-pay | Admitting: Internal Medicine

## 2023-03-20 ENCOUNTER — Other Ambulatory Visit: Payer: Self-pay

## 2023-03-20 MED ORDER — METFORMIN HCL ER 500 MG PO TB24: 1000.0000 mg | ORAL_TABLET | Freq: Every day | ORAL | 3 refills | Status: DC

## 2023-03-20 MED ORDER — RYBELSUS 14 MG PO TABS: 1.0000 | ORAL_TABLET | Freq: Every day | ORAL | 3 refills | Status: DC

## 2023-03-25 NOTE — Progress Notes (Unsigned)
Cardiology Office Note:    Date:  03/25/2023   ID:  [name] [name], DOB 1951-09-10, MRN [id number]  PCP:  Philip Aspen, Limmie Patricia, MD   [facility] Providers Cardiologist:  Meriam Sprague, MD {  Referring MD: Philip Aspen, [name]*    History of Present Illness:    [name] [name] is a 72 y.o. male with a hx of CAD s/p PCI following positive stress test in 2006, HTN, HLD, DMII and tobacco use who was previously followed by Dr. Delton See who now returns to clinic for follow-up.  Seen in clinic on 03/2022 where he had been having a GIB in the setting of known AVMs. Plavix was stopped and he was planned for EGD/colo with GI.   Was last seen in clinic on 09/2022 where he was having atypical, sharp chest pain. TTE 09/2022 with LVEF 60-65%, moderate LVH, G1DD, normal RV, no significant valve disease, mild dilation of the ascending aorta 41mm.  Today, ***   Past Medical History:  Diagnosis Date   Anemia, iron deficiency 11/18/2008   Resolved, despite no rx--2012     ANEMIA-IRON DEFICIENCY    Arthritis    BACK PAIN, LUMBAR    Blood transfusion without reported diagnosis    CORONARY ARTERY DISEASE    DIVERTICULOSIS, COLON    Elevated LFTs 11/18/2015   GERD    Hepatitis    States he was tested for Hep B and he had antibodies   HYPERLIPIDEMIA    HYPERTENSION    dr tom wall   Nephrolithiasis    PONV (postoperative nausea and vomiting)    little nausea after having his hip replacement   Type 2 diabetes mellitus (HCC)    Unspecified disorder of urethra and urinary tract     Past Surgical History:  Procedure Laterality Date   COLONOSCOPY     CORONARY ANGIOPLASTY WITH STENT PLACEMENT     06    dr t. wall   ELECTROCARDIOGRAM  03/30/2007   ENTEROSCOPY N/A 04/30/2022   Procedure: ENTEROSCOPY;  Surgeon: Iva Boop, MD;  Location: [facility];  Service: Gastroenterology;  Laterality: N/A;   ESOPHAGOGASTRODUODENOSCOPY  11/07/2005   kidney stones     in office  procedure to remove but no sedation surgeries for kidney stones    LUMBAR LAMINECTOMY Right 05/18/2014   Procedure: LUMBAR LAMINECTOMY Microdiscectomy Right Lumbar five - sacral one;  Surgeon: Eldred Manges, MD;  Location: [facility] OR;  Service: Orthopedics;  Laterality: Right;   Stress Cardiolite  11/30/2004   SUBMUCOSAL TATTOO INJECTION  04/30/2022   Procedure: SUBMUCOSAL TATTOO INJECTION;  Surgeon: Iva Boop, MD;  Location: [facility];  Service: Gastroenterology;;   TOTAL HIP ARTHROPLASTY  10/28/2012   Procedure: TOTAL HIP ARTHROPLASTY;  Surgeon: Loreta Ave, MD;  [facility];  Service: Orthopedics;  Laterality: Right;   UPPER GASTROINTESTINAL ENDOSCOPY      Current Medications: No outpatient medications have been marked as taking for the 03/27/23 encounter (Appointment) with Meriam Sprague, MD.     Allergies:   Actos [pioglitazone hydrochloride]   Social History   Socioeconomic History   Marital status: Single    Spouse name: Not on file   Number of children: 0   Years of education: Not on file   Highest education level: Not on file  Occupational History   Occupation: Adult nurse    Employer: [employer]    Comment: works Naval architect   Occupation: Retired  Tobacco Use  Smoking status: Every Day    Packs/day: 1.00    Years: 48.00    Additional pack years: 0.00    Total pack years: 48.00    Types: Cigarettes   Smokeless tobacco: Never   Tobacco comments:    states he wants to reduce his dependence.  Vaping Use   Vaping Use: Never used  Substance and Sexual Activity   Alcohol use: Yes    Alcohol/week: 0.0 standard drinks of alcohol    Comment: rare   Drug use: No   Sexual activity: Not Currently  Other Topics Concern   Not on file  Social History Narrative   Originally from [address], [place]   Lives alone with dog, has a couple of friends he can rely on for support.    Social Determinants of Health   Financial Resource Strain: Low Risk   (01/13/2023)   Overall Financial Resource Strain (CARDIA)    Difficulty of Paying Living Expenses: Not hard at all  Food Insecurity: No Food Insecurity (01/13/2023)   Hunger Vital Sign    Worried About Running Out of Food in the Last Year: Never true    Ran Out of Food in the Last Year: Never true  Transportation Needs: No Transportation Needs (01/13/2023)   PRAPARE - Administrator, Civil Service (Medical): No    Lack of Transportation (Non-Medical): No  Physical Activity: Insufficiently Active (01/13/2023)   Exercise Vital Sign    Days of Exercise per Week: 2 days    Minutes of Exercise per Session: 30 min  Stress: No Stress Concern Present (01/13/2023)   [employer] of Occupational Health - Occupational Stress Questionnaire    Feeling of Stress : Not at all  Social Connections: Socially Isolated (01/13/2023)   Social Connection and Isolation Panel [NHANES]    Frequency of Communication with Friends and Family: Twice a week    Frequency of Social Gatherings with Friends and Family: Twice a week    Attends Religious Services: Never    Database administrator or Organizations: No    Attends Engineer, structural: Never    Marital Status: Never married     Family History: The patient's family history includes Breast cancer in his sister; Colon cancer in his mother; Heart disease in his father. There is no history of Esophageal cancer, Rectal cancer, or Stomach cancer.  ROS:   Please see the history of present illness.    Review of Systems  Constitutional:  Negative for chills and fever.  HENT:  Negative for hearing loss.   Eyes:  Negative for blurred vision and redness.  Respiratory:  Positive for cough.   Cardiovascular:  Positive for chest pain. Negative for palpitations, orthopnea, claudication, leg swelling and PND.  Gastrointestinal:  Negative for melena, nausea and vomiting.  Genitourinary:  Negative for dysuria and flank pain.  Musculoskeletal:  Positive for  joint pain.  Neurological:  Negative for dizziness and loss of consciousness.  Endo/Heme/Allergies:  Negative for polydipsia.  Psychiatric/Behavioral:  Negative for memory loss.     EKGs/Labs/Other Studies Reviewed:    The following studies were reviewed today:  CT Chest 01/29/2022: Cardiovascular: The heart size is normal. No substantial pericardial effusion. Coronary artery calcification is evident. Mild atherosclerotic calcification is noted in the wall of the thoracic aorta.   Mediastinum/Nodes: No mediastinal lymphadenopathy. No evidence for gross hilar lymphadenopathy although assessment is limited by the lack of intravenous contrast on the current study. The esophagus has normal imaging  features. There is no axillary lymphadenopathy.   Lungs/Pleura: Centrilobular emphsyema noted. Previously identified tiny bilateral pulmonary nodules are stable in the interval. No new suspicious pulmonary nodule or mass. No focal airspace consolidation. No pleural effusion.   Upper Abdomen: Unremarkable.   Musculoskeletal: No worrisome lytic or sclerotic osseous abnormality.   IMPRESSION: Lung-RADS 2, benign appearance or behavior. Continue annual screening with low-dose chest CT without contrast in 12 months.   Emphysema (ICD10-J43.9) and Aortic Atherosclerosis (ICD10-170.0)  Bilateral Carotid Duplex 09/21/2021: Summary:  Right Carotid: Velocities in the right ICA are consistent with a 1-39%  stenosis.   Left Carotid: Velocities in the left ICA are consistent with a 1-39%  stenosis.   Vertebrals:  Bilateral vertebral arteries demonstrate antegrade flow.  Subclavians: Normal flow hemodynamics were seen in bilateral subclavian               arteries.   Myoview 08/2019: Inconclusive ECG with significant motion artifact. Diffuse upsloping ST depressions, appear to become horizontal in V3 and V4 leads. Nuclear stress EF: 60%. The left ventricular ejection fraction is normal  (55-65%). Defect 1: There is a medium fixed defect of mild severity present in the basal inferior, mid inferior and apical inferior location. Given normal wall motion in this region, consistent with artifact The study is normal. This is a low risk study.    EKG:   ***   Recent Labs: 01/14/2023: Hemoglobin 17.5; Platelets 248.0 02/27/2023: ALT 75; BUN 17; Creatinine, Ser 0.87; Potassium 4.4; Sodium 142   Recent Lipid Panel    Component Value Date/Time   CHOL 96 01/14/2023 1354   CHOL 86 (L) 09/11/2020 1456   TRIG 222.0 (H) 01/14/2023 1354   HDL 52.30 01/14/2023 1354   HDL 57 09/11/2020 1456   CHOLHDL 2 01/14/2023 1354   VLDL 44.4 (H) 01/14/2023 1354   LDLCALC -3 (L) 01/10/2022 1348   LDLCALC 11 09/11/2020 1456   LDLDIRECT 26.0 01/14/2023 1354     Physical Exam:    VS:  There were no vitals taken for this visit.    Wt Readings from Last 3 Encounters:  01/13/23 193 lb 1.6 oz (87.6 kg)  01/10/23 193 lb (87.5 kg)  11/29/22 193 lb (87.5 kg)     GEN:  Well nourished, well developed in no acute distress HEENT: Normal NECK: No JVD; No carotid bruits CARDIAC: RRR, no murmurs, rubs, gallops RESPIRATORY:  CTAB, good air movement ABDOMEN: Soft, non-tender, non-distended MUSCULOSKELETAL:  No edema; No deformity  SKIN: Warm and dry NEUROLOGIC:  Alert and oriented x 3 PSYCHIATRIC:  Normal affect   ASSESSMENT:    No diagnosis found.    PLAN:    In order of problems listed above:   #CAD s/p remote PCI in 2006: Unknown artery intervened upon. Stress test negative in 2020 with no ischemia or scar, LVEF 60-65%. Doing well with no anginal symptoms. Tolerating all medications as prescribed. -Continue vascepa, repatha -Continue ASA 81mg  daily -Continue atenolol 25mg  daily -Continue lisinopril 40mg  daily  #HTN: Well controlled and at goal <130/90s. -Continue atenolol 25mg  daily -Continue lisinopril 40mg  daily  #HLD: #Statin intolerance: Very well controlled. Followed by  lipid clinic.  -Continue repatha, low dose crestor 2.5mg  3x/week, and vascepa  #DMII: -Continue metformin and farxiga  #Tobacco use: -Not interested in quitting -Annual CT scans  #GI AVMs: #Iron Deficiency Anemia: Followed by GI. Hemoglobin improved.   Follow-up:  6 months.  Medication Adjustments/Labs and Tests Ordered: Current medicines are reviewed at length with the patient  today.  Concerns regarding medicines are outlined above.   No orders of the defined types were placed in this encounter.  No orders of the defined types were placed in this encounter.  There are no Patient Instructions on file for this visit.     Signed, Meriam Sprague, MD  03/25/2023 7:49 PM    [facility]

## 2023-03-27 ENCOUNTER — Ambulatory Visit: Payer: 59 | Attending: Cardiology | Admitting: Cardiology

## 2023-03-27 ENCOUNTER — Encounter: Payer: Self-pay | Admitting: Cardiology

## 2023-03-27 VITALS — BP 126/84 | HR 85 | Ht 67.0 in | Wt 196.0 lb

## 2023-03-27 DIAGNOSIS — D649 Anemia, unspecified: Secondary | ICD-10-CM | POA: Diagnosis not present

## 2023-03-27 DIAGNOSIS — I2510 Atherosclerotic heart disease of native coronary artery without angina pectoris: Secondary | ICD-10-CM

## 2023-03-27 DIAGNOSIS — F17200 Nicotine dependence, unspecified, uncomplicated: Secondary | ICD-10-CM | POA: Diagnosis not present

## 2023-03-27 DIAGNOSIS — I77819 Aortic ectasia, unspecified site: Secondary | ICD-10-CM

## 2023-03-27 DIAGNOSIS — Z7984 Long term (current) use of oral hypoglycemic drugs: Secondary | ICD-10-CM | POA: Diagnosis not present

## 2023-03-27 DIAGNOSIS — I10 Essential (primary) hypertension: Secondary | ICD-10-CM

## 2023-03-27 DIAGNOSIS — E782 Mixed hyperlipidemia: Secondary | ICD-10-CM | POA: Diagnosis not present

## 2023-03-27 DIAGNOSIS — I2583 Coronary atherosclerosis due to lipid rich plaque: Secondary | ICD-10-CM | POA: Diagnosis not present

## 2023-03-27 DIAGNOSIS — Z789 Other specified health status: Secondary | ICD-10-CM

## 2023-03-27 DIAGNOSIS — E119 Type 2 diabetes mellitus without complications: Secondary | ICD-10-CM | POA: Diagnosis not present

## 2023-03-27 DIAGNOSIS — I6523 Occlusion and stenosis of bilateral carotid arteries: Secondary | ICD-10-CM

## 2023-03-27 NOTE — Patient Instructions (Signed)
Medication Instructions:   Your physician recommends that you continue on your current medications as directed. Please refer to the Current Medication list given to you today.  *If you need a refill on your cardiac medications before your next appointment, please call your pharmacy*   Testing/Procedures:  Your physician has requested that you have a carotid duplex. This test is an ultrasound of the carotid arteries in your neck. It looks at blood flow through these arteries that supply the brain with blood. Allow one hour for this exam. There are no restrictions or special instructions.  SCHEDULE CAROTIDS TO BE DONE IN SEPTEMBER 2024 PER DR. Shari Prows    Follow-Up: At [facility], you and your health needs are our priority.  As part of our continuing mission to provide you with exceptional heart care, we have created designated Provider Care Teams.  These Care Teams include your primary Cardiologist (physician) and Advanced Practice Providers (APPs -  Physician Assistants and Nurse Practitioners) who all work together to provide you with the care you need, when you need it.  We recommend signing up for the patient portal called "MyChart".  Sign up information is provided on this After Visit Summary.  MyChart is used to connect with patients for Virtual Visits (Telemedicine).  Patients are able to view lab/test results, encounter notes, upcoming appointments, etc.  Non-urgent messages can be sent to your provider as well.   To learn more about what you can do with MyChart, go to [email or web address].    Your next appointment:   6 month(s)  Provider:   Jari Favre, PA-C, Ronie Spies, PA-C, Robin Searing, NP, Jacolyn Reedy, PA-C, Eligha Bridegroom, NP, or Tereso Newcomer, PA-C

## 2023-03-27 NOTE — Progress Notes (Signed)
Cardiology Office Note:    Date:  03/27/2023   ID:  [name] [name], DOB 1951/08/12, MRN [id number]  PCP:  [name] [name], [name] Patricia, MD   [facility] Providers Cardiologist:  [name] Sprague, MD {  Referring MD: [name] [name], [name] [name]*    History of Present Illness:    [name] [name] is a 72 y.o. male with a hx of CAD s/p PCI following positive stress test in 2006, HTN, HLD, DMII and tobacco use who was previously followed by Dr. Delton [name] who now returns to clinic for follow-up.  Seen in clinic on 03/2022 where he had been having a GIB in the setting of known AVMs. Plavix was stopped and he was planned for EGD/colo with GI.   Was last seen in clinic on 09/2022 where he was having atypical, sharp chest pain. TTE 09/2022 with LVEF 60-65%, moderate LVH, G1DD, normal RV, no significant valve disease, mild dilation of the ascending aorta 41mm.  Today, notes same twinge of CP every now and then for ten seconds or less, similar to before, happening at rest on left side. Is otherwise taking all medications and has no problems today. He is curios about Fatty Liver disease, given how low his LDL is. Also noted to have a calcium plaque in neck (bilateral carotid calcification on DG Cervical Spine) and is wondering what to do about it. Notes some lightheadedness whenever he is looking up (shaving, looking into cabinet), but no syncope or exertional symptoms.   He otherwise denies any peripheral edema, headaches, syncope, orthoprnea, or PND   Past Medical History:  Diagnosis Date   Anemia, iron deficiency 11/18/2008   Resolved, despite no rx--2012     ANEMIA-IRON DEFICIENCY    Arthritis    BACK PAIN, LUMBAR    Blood transfusion without reported diagnosis    CORONARY ARTERY DISEASE    DIVERTICULOSIS, COLON    Elevated LFTs 11/18/2015   GERD    Hepatitis    States he was tested for Hep B and he had antibodies   HYPERLIPIDEMIA    HYPERTENSION    dr [name] [name]   Nephrolithiasis     PONV (postoperative nausea and vomiting)    little nausea after having his hip replacement   Type 2 diabetes mellitus (HCC)    Unspecified disorder of urethra and urinary tract     Past Surgical History:  Procedure Laterality Date   COLONOSCOPY     CORONARY ANGIOPLASTY WITH STENT PLACEMENT     06    dr t. [name]   ELECTROCARDIOGRAM  03/30/2007   ENTEROSCOPY N/A 04/30/2022   Procedure: ENTEROSCOPY;  Surgeon: [name] Boop, MD;  Location: [facility];  Service: Gastroenterology;  Laterality: N/A;   ESOPHAGOGASTRODUODENOSCOPY  11/07/2005   kidney stones     in office procedure to remove but no sedation surgeries for kidney stones    LUMBAR LAMINECTOMY Right 05/18/2014   Procedure: LUMBAR LAMINECTOMY Microdiscectomy Right Lumbar five - sacral one;  Surgeon: [name] Manges, MD;  Location: [facility] OR;  Service: Orthopedics;  Laterality: Right;   Stress Cardiolite  11/30/2004   SUBMUCOSAL TATTOO INJECTION  04/30/2022   Procedure: SUBMUCOSAL TATTOO INJECTION;  Surgeon: [name] Boop, MD;  Location: [facility];  Service: Gastroenterology;;   TOTAL HIP ARTHROPLASTY  10/28/2012   Procedure: TOTAL HIP ARTHROPLASTY;  Surgeon: [name] Ave, MD;  Location: [facility] OR;  Service: Orthopedics;  Laterality: Right;   UPPER GASTROINTESTINAL ENDOSCOPY      Current Medications: Current  Meds  Medication Sig   Ascorbic Acid (VITAMIN C) 1000 MG tablet Take 2,000 mg by mouth daily.   aspirin EC 81 MG tablet Take 1 tablet (81 mg total) by mouth daily.   atenolol (TENORMIN) 25 MG tablet Take 1 tablet (25 mg total) by mouth daily.   b complex vitamins tablet Take 1 tablet by mouth daily.   Biotin 5000 MCG TABS Take 5,000 mcg by mouth daily.   Cholecalciferol (VITAMIN D) 2000 UNITS tablet Take 2,000 Units by mouth daily.   Coenzyme Q10 (CO Q 10) 100 MG CAPS Take 200 mg by mouth daily.    Cyanocobalamin (VITAMIN B-12 PO) Take 1 tablet by mouth daily.   dapagliflozin propanediol (FARXIGA) 10 MG TABS tablet TAKE 1 TABLET  BY MOUTH DAILY BEFORE BREAKFAST.   Evolocumab (REPATHA SURECLICK) 140 MG/ML SOAJ Inject 1 pen  into the skin every 14 (fourteen) days.   Ginkgo Biloba (GINKOBA PO) Take 1 tablet by mouth daily.   glucose blood (ONETOUCH VERIO) test strip USE AS DIRECTED to check blood sugar 1X daily   icosapent Ethyl (VASCEPA) 1 g capsule Take 2 capsules (2 g total) by mouth 2 (two) times daily.   lisinopril (ZESTRIL) 40 MG tablet Take 1 tablet (40 mg total) by mouth daily.   Magnesium 400 MG CAPS Take 400 mg by mouth daily.    metFORMIN (GLUCOPHAGE-XR) 500 MG 24 hr tablet Take 2 tablets (1,000 mg total) by mouth daily.   methocarbamol (ROBAXIN) 500 MG tablet Take 1 tablet (500 mg total) by mouth every 8 (eight) hours as needed for muscle spasms.   Multiple Vitamin (MULTIVITAMIN) tablet Take 1 tablet by mouth daily.   nitroGLYCERIN (NITROSTAT) 0.4 MG SL tablet Place 1 tablet (0.4 mg total) under the tongue every 5 (five) minutes as needed for chest pain.   pantoprazole (PROTONIX) 40 MG tablet TAKE 1 TABLET BY MOUTH EVERY DAY   rosuvastatin (CRESTOR) 5 MG tablet Take 0.5 tablets (2.5 mg total) by mouth daily.   Semaglutide (RYBELSUS) 14 MG TABS Take 1 tablet (14 mg total) by mouth daily.   testosterone (ANDROGEL) 50 MG/5GM (1%) GEL APPLY 50 MG (ONE PACKET) TOPICALLY DAILY.   Zinc 50 MG TABS Take 50 mg by mouth daily.     Allergies:   Actos [pioglitazone hydrochloride]   Social History   Socioeconomic History   Marital status: Single    Spouse name: Not on file   Number of children: 0   Years of education: Not on file   Highest education level: Not on file  Occupational History   Occupation: Facilities manager: [employer]    Comment: works Naval architect   Occupation: Retired  Tobacco Use   Smoking status: Every Day    Packs/day: 1.00    Years: 48.00    Additional pack years: 0.00    Total pack years: 48.00    Types: Cigarettes   Smokeless tobacco: Never   Tobacco comments:    states  he wants to reduce his dependence.  Vaping Use   Vaping Use: Never used  Substance and Sexual Activity   Alcohol use: Yes    Alcohol/week: 0.0 standard drinks of alcohol    Comment: rare   Drug use: No   Sexual activity: Not Currently  Other Topics Concern   Not on file  Social History Narrative   Originally from [address], [place]   Lives alone with dog, has a couple of friends he can rely  on for support.    Social Determinants of Health   Financial Resource Strain: Low Risk  (01/13/2023)   Overall Financial Resource Strain (CARDIA)    Difficulty of Paying Living Expenses: Not hard at all  Food Insecurity: No Food Insecurity (01/13/2023)   Hunger Vital Sign    Worried About Running Out of Food in the Last Year: Never true    Ran Out of Food in the Last Year: Never true  Transportation Needs: No Transportation Needs (01/13/2023)   PRAPARE - Administrator, Civil Service (Medical): No    Lack of Transportation (Non-Medical): No  Physical Activity: Insufficiently Active (01/13/2023)   Exercise Vital Sign    Days of Exercise per Week: 2 days    Minutes of Exercise per Session: 30 min  Stress: No Stress Concern Present (01/13/2023)   [employer] of Occupational Health - Occupational Stress Questionnaire    Feeling of Stress : Not at all  Social Connections: Socially Isolated (01/13/2023)   Social Connection and Isolation Panel [NHANES]    Frequency of Communication with Friends and Family: Twice a week    Frequency of Social Gatherings with Friends and Family: Twice a week    Attends Religious Services: Never    Database administrator or Organizations: No    Attends Engineer, structural: Never    Marital Status: Never married     Family History: The patient's family history includes Breast cancer in his sister; Colon cancer in his mother; Heart disease in his father. There is no history of Esophageal cancer, Rectal cancer, or Stomach cancer.  ROS:   Please [name]  the history of present illness.    Review of Systems  Constitutional:  Negative for chills and fever.  HENT:  Negative for hearing loss.   Eyes:  Negative for blurred vision and redness.  Cardiovascular:  Positive for chest pain. Negative for palpitations, orthopnea, claudication, leg swelling and PND.  Gastrointestinal:  Negative for nausea and vomiting.  Neurological:  Positive for dizziness. Negative for loss of consciousness.  Endo/Heme/Allergies:  Negative for polydipsia.  Psychiatric/Behavioral:  Negative for memory loss.     EKGs/Labs/Other Studies Reviewed:    The following studies were reviewed today:  CT Chest 01/29/2022: Cardiovascular: The heart size is normal. No substantial pericardial effusion. Coronary artery calcification is evident. Mild atherosclerotic calcification is noted in the [name] of the thoracic aorta.   Mediastinum/Nodes: No mediastinal lymphadenopathy. No evidence for gross hilar lymphadenopathy although assessment is limited by the lack of intravenous contrast on the current study. The esophagus has normal imaging features. There is no axillary lymphadenopathy.   Lungs/Pleura: Centrilobular emphsyema noted. Previously identified tiny bilateral pulmonary nodules are stable in the interval. No new suspicious pulmonary nodule or mass. No focal airspace consolidation. No pleural effusion.   Upper Abdomen: Unremarkable.   Musculoskeletal: No worrisome lytic or sclerotic osseous abnormality.   IMPRESSION: Lung-RADS 2, benign appearance or behavior. Continue annual screening with low-dose chest CT without contrast in 12 months.   Emphysema (ICD10-J43.9) and Aortic Atherosclerosis (ICD10-170.0)  Bilateral Carotid Duplex 09/21/2021: Summary:  Right Carotid: Velocities in the right ICA are consistent with a 1-39%  stenosis.   Left Carotid: Velocities in the left ICA are consistent with a 1-39%  stenosis.   Vertebrals:  Bilateral vertebral arteries  demonstrate antegrade flow.  Subclavians: Normal flow hemodynamics were seen in bilateral subclavian               arteries.  Myoview 08/2019: Inconclusive ECG with significant motion artifact. Diffuse upsloping ST depressions, appear to become horizontal in V3 and V4 leads. Nuclear stress EF: 60%. The left ventricular ejection fraction is normal (55-65%). Defect 1: There is a medium fixed defect of mild severity present in the basal inferior, mid inferior and apical inferior location. Given normal [name] motion in this region, consistent with artifact The study is normal. This is a low risk study.    EKG:   No new tracing   Recent Labs: 01/14/2023: Hemoglobin 17.5; Platelets 248.0 02/27/2023: ALT 75; BUN 17; Creatinine, Ser 0.87; Potassium 4.4; Sodium 142   Recent Lipid Panel    Component Value Date/Time   CHOL 96 01/14/2023 1354   CHOL 86 (L) 09/11/2020 1456   TRIG 222.0 (H) 01/14/2023 1354   HDL 52.30 01/14/2023 1354   HDL 57 09/11/2020 1456   CHOLHDL 2 01/14/2023 1354   VLDL 44.4 (H) 01/14/2023 1354   LDLCALC -3 (L) 01/10/2022 1348   LDLCALC 11 09/11/2020 1456   LDLDIRECT 26.0 01/14/2023 1354     Physical Exam:    VS:  BP 126/84   Pulse 85   Ht 5\' 7"  (1.702 m)   Wt 196 lb (88.9 kg)   SpO2 95%   BMI 30.70 kg/m     Wt Readings from Last 3 Encounters:  03/27/23 196 lb (88.9 kg)  01/13/23 193 lb 1.6 oz (87.6 kg)  01/10/23 193 lb (87.5 kg)     GEN:  Well nourished, well developed in no acute distress HEENT: Normal NECK: No JVD; No carotid bruits CARDIAC: RRR, no murmurs, rubs, gallops RESPIRATORY:  CTAB, good air movement ABDOMEN: Soft, non-tender, non-distended MUSCULOSKELETAL:  No edema; No deformity  SKIN: Warm and dry NEUROLOGIC:  Alert and oriented x 3 PSYCHIATRIC:  Normal affect   ASSESSMENT:    1. Coronary artery disease due to lipid rich plaque   2. Bilateral carotid artery stenosis   3. Essential hypertension   4. Anemia, unspecified type   5.  Mixed hyperlipidemia   6. Statin intolerance   7. Diabetes mellitus with coincident hypertension (HCC)   8. TOBACCO USER   9. Dilation of aorta (HCC)      PLAN:    In order of problems listed above:   #CAD s/p remote PCI in 2006 Unknown artery intervened upon. Stress test negative in 2020 with no ischemia or scar, LVEF 60-65%. Doing well with no anginal symptoms. Tolerating all medications as prescribed. -Continue vascepa, repatha -Continue ASA 81mg  daily -Continue atenolol 25mg  daily -Continue lisinopril 40mg  daily  #Bilateral Carotid Artery Stenosis Patient noting lightheadedness when looking up (shaving, looking up into cabinet), w/ Hx of 1-39% bilateral carotid artery stenosis. Patient is on appropriate lipid lowering/plaque stabilizing medications. Patient is noting dizziness when looking up and is due for repeat carotid US.  -Repeat Carotid US -Continue vascepa, repatha   #HTN: Well controlled and at goal <130/90s. -Continue atenolol 25mg  daily -Continue lisinopril 40mg  daily  #HLD: #Statin intolerance: Very well controlled. Followed by lipid clinic.  -Continue repatha, low dose crestor 2.5mg  3x/week, and vascepa  #Mild aortic dilation: -Ascending aorta measures 41mm on TTE in 09/2022 -Repeat TTE 09/2023 for monitoring -Continue medical therapy as above   #DMII: -Continue metformin and farxiga, rybelsus   #Tobacco use: -Not interested in quitting -Annual CT scans  #GI AVMs: #Iron Deficiency Anemia: Followed by GI. Hemoglobin stable.   Follow-up:  6 months.  Medication Adjustments/Labs and Tests Ordered: Current medicines are reviewed at  length with the patient today.  Concerns regarding medicines are outlined above.   Orders Placed This Encounter  Procedures   VAS US CAROTID   No orders of the defined types were placed in this encounter.  Patient Instructions  Medication Instructions:   Your physician recommends that you continue on your current  medications as directed. Please refer to the Current Medication list given to you today.  *If you need a refill on your cardiac medications before your next appointment, please call your pharmacy*   Testing/Procedures:  Your physician has requested that you have a carotid duplex. This test is an ultrasound of the carotid arteries in your neck. It looks at blood flow through these arteries that supply the brain with blood. Allow one hour for this exam. There are no restrictions or special instructions.  SCHEDULE CAROTIDS TO BE DONE IN SEPTEMBER 2024 PER DR. Shari Prows    Follow-Up: At [facility], you and your health needs are our priority.  As part of our continuing mission to provide you with exceptional heart care, we have created designated Provider Care Teams.  These Care Teams include your primary Cardiologist (physician) and Advanced Practice Providers (APPs -  Physician Assistants and Nurse Practitioners) who all work together to provide you with the care you need, when you need it.  We recommend signing up for the patient portal called "MyChart".  Sign up information is provided on this After Visit Summary.  MyChart is used to connect with patients for Virtual Visits (Telemedicine).  Patients are able to view lab/test results, encounter notes, upcoming appointments, etc.  Non-urgent messages can be sent to your provider as well.   To learn more about what you can do with MyChart, go to [email or web address].    Your next appointment:   6 month(s)  Provider:   Jari Favre, PA-C, Ronie Spies, PA-C, Robin Searing, NP, Jacolyn Reedy, PA-C, Eligha Bridegroom, NP, or Tereso Newcomer, PA-C              Signed, [name] Sprague, MD  03/27/2023 3:10 PM    [facility]

## 2023-04-08 ENCOUNTER — Encounter: Payer: Self-pay | Admitting: Internal Medicine

## 2023-04-08 ENCOUNTER — Ambulatory Visit (INDEPENDENT_AMBULATORY_CARE_PROVIDER_SITE_OTHER): Payer: 59 | Admitting: Internal Medicine

## 2023-04-08 VITALS — BP 124/82 | HR 82 | Ht 67.0 in | Wt 197.2 lb

## 2023-04-08 DIAGNOSIS — E785 Hyperlipidemia, unspecified: Secondary | ICD-10-CM

## 2023-04-08 DIAGNOSIS — E119 Type 2 diabetes mellitus without complications: Secondary | ICD-10-CM

## 2023-04-08 DIAGNOSIS — Z7984 Long term (current) use of oral hypoglycemic drugs: Secondary | ICD-10-CM | POA: Diagnosis not present

## 2023-04-08 DIAGNOSIS — E1169 Type 2 diabetes mellitus with other specified complication: Secondary | ICD-10-CM

## 2023-04-08 DIAGNOSIS — E1159 Type 2 diabetes mellitus with other circulatory complications: Secondary | ICD-10-CM

## 2023-04-08 LAB — POCT GLYCOSYLATED HEMOGLOBIN (HGB A1C): Hemoglobin A1C: 6.2 % — AB (ref 4.0–5.6)

## 2023-04-08 NOTE — Progress Notes (Signed)
Patient ID: [name] [name], male   DOB: Jan 06, 1951, 72 y.o.   MRN: [id number]  HPI: [name] [name] is a 72 y.o.-year-old male, returning for follow-up for DM2, dx in 2008, non-insulin-dependent, uncontrolled, with circulatory complications (CAD). Pt. previously saw Dr. Everardo All, last visit 6 months ago.  Interim history: No increased urination, blurry vision, nausea, chest pain. He has no complaints at today's visit.  Reviewed HbA1c: Lab Results  Component Value Date   HGBA1C 6.2 01/14/2023   HGBA1C 5.9 (A) 10/11/2022   HGBA1C 5.8 (A) 04/11/2022   HGBA1C 7.1 (H) 01/10/2022   HGBA1C 6.8 (A) 09/26/2021   HGBA1C 6.8 (A) 03/22/2021   HGBA1C 7.1 (H) 01/09/2021   HGBA1C 6.2 (H) 09/11/2020   HGBA1C 6.7 (A) 05/16/2020   HGBA1C 7.2 (A) 02/15/2020   Pt is on a regimen of: - Metformin ER 1000 mg with dinner - Farxiga 10 mg before breakfast - Rybelsus 14 mg before breakfast He tried Actos but this caused weight gain.  Pt was checking his sugars infrequently:             - am: 144 >> n/c  - 2h after b'fast: n/c - before lunch: n/c >> 143 >> n/c - 2h after lunch: n/c >> 120 >> n/c - before dinner: 91 >> 67-102 >> 84-104 - 2h after dinner: n/c >> 99 >> n/c - bedtime: n/c - nighttime: n/c Lowest sugar was 91 >> 67 >> 84; he has hypoglycemia awareness at 70.  Highest sugar was 144 >> 143 >> 104. He stays up until 2-3 am.   Glucometer: One Touch Verio  - no CKD, last BUN/creatinine:  Lab Results  Component Value Date   BUN 17 02/27/2023   BUN 16 01/14/2023   CREATININE 0.87 02/27/2023   CREATININE 0.85 01/14/2023  On lisinopril 40 mg daily.  - + HL; last set of lipids: Lab Results  Component Value Date   CHOL 96 01/14/2023   HDL 52.30 01/14/2023   LDLCALC -3 (L) 01/10/2022   LDLDIRECT 26.0 01/14/2023   TRIG 222.0 (H) 01/14/2023   CHOLHDL 2 01/14/2023  On Repatha, Vascepa 2 g twice a day, Crestor 2.5 mg daily (reduced recently), and co-Q10.  - last eye exam was in  12/11/2022: No DR reportedly. Dr. Lorin Picket. He had cataract sx. 11/2022.  - no numbness and tingling in his feet.  Last foot exam 01/10/2022.  He is on ASA 81.  He does have a history of NASH-on vitamin E, anemia, HTN, hypogonadism-on testosterone injections, history of nephrolithiasis, diverticulosis.  ROS: + see HPI  Past Medical History:  Diagnosis Date   Anemia, iron deficiency 11/18/2008   Resolved, despite no rx--2012     ANEMIA-IRON DEFICIENCY    Arthritis    BACK PAIN, LUMBAR    Blood transfusion without reported diagnosis    CORONARY ARTERY DISEASE    DIVERTICULOSIS, COLON    Elevated LFTs 11/18/2015   GERD    Hepatitis    States he was tested for Hep B and he had antibodies   HYPERLIPIDEMIA    HYPERTENSION    dr tom wall   Nephrolithiasis    PONV (postoperative nausea and vomiting)    little nausea after having his hip replacement   Type 2 diabetes mellitus (HCC)    Unspecified disorder of urethra and urinary tract    Past Surgical History:  Procedure Laterality Date   COLONOSCOPY     CORONARY ANGIOPLASTY WITH STENT PLACEMENT     06  dr t. wall   ELECTROCARDIOGRAM  03/30/2007   ENTEROSCOPY N/A 04/30/2022   Procedure: ENTEROSCOPY;  Surgeon: Iva Boop, MD;  Location: [facility];  Service: Gastroenterology;  Laterality: N/A;   ESOPHAGOGASTRODUODENOSCOPY  11/07/2005   kidney stones     in office procedure to remove but no sedation surgeries for kidney stones    LUMBAR LAMINECTOMY Right 05/18/2014   Procedure: LUMBAR LAMINECTOMY Microdiscectomy Right Lumbar five - sacral one;  Surgeon: Eldred Manges, MD;  Location: [facility] OR;  Service: Orthopedics;  Laterality: Right;   Stress Cardiolite  11/30/2004   SUBMUCOSAL TATTOO INJECTION  04/30/2022   Procedure: SUBMUCOSAL TATTOO INJECTION;  Surgeon: Iva Boop, MD;  Location: [facility];  Service: Gastroenterology;;   TOTAL HIP ARTHROPLASTY  10/28/2012   Procedure: TOTAL HIP ARTHROPLASTY;  Surgeon: Loreta Ave, MD;   Location: [facility] 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  Vaping Use   Vaping Use: Never used  Substance and Sexual Activity   Alcohol use: Yes    Alcohol/week: 0.0 standard drinks of alcohol    Comment: rare   Drug use: No   Sexual activity: Not Currently  Other Topics Concern   Not on file  Social History Narrative   Originally from [address], [place]   Lives alone with dog, has a couple of friends he can rely on for support.    Social Determinants of Health   Financial Resource Strain: Low Risk  (01/13/2023)   Overall Financial Resource Strain (CARDIA)    Difficulty of Paying Living Expenses: Not hard at all  Food Insecurity: No Food Insecurity (01/13/2023)   Hunger Vital Sign    Worried About Running Out of Food in the Last Year: Never true    Ran Out of Food in the Last Year: Never true  Transportation Needs: No Transportation Needs (01/13/2023)   PRAPARE - Administrator, Civil Service (Medical): No    Lack of Transportation (Non-Medical): No  Physical Activity: Insufficiently Active (01/13/2023)   Exercise Vital Sign    Days of Exercise per Week: 2 days    Minutes of Exercise per Session: 30 min  Stress: No Stress Concern Present (01/13/2023)   [employer] of Occupational Health - Occupational Stress Questionnaire    Feeling of Stress : Not at all   Social Connections: Socially Isolated (01/13/2023)   Social Connection and Isolation Panel [NHANES]    Frequency of Communication with Friends and Family: Twice a week    Frequency of Social Gatherings with Friends and Family: Twice a week    Attends Religious Services: Never    Database administrator or Organizations: No    Attends Banker Meetings: Never  Marital Status: Never married  Intimate Partner Violence: Not At Risk (01/13/2023)   Humiliation, Afraid, Rape, and Kick questionnaire    Fear of Current or Ex-Partner: No    Emotionally Abused: No    Physically Abused: No    Sexually Abused: No   Current Outpatient Medications on File Prior to Visit  Medication Sig Dispense Refill   Ascorbic Acid (VITAMIN C) 1000 MG tablet Take 2,000 mg by mouth daily.     aspirin EC 81 MG tablet Take 1 tablet (81 mg total) by mouth daily. 30 tablet 11   atenolol (TENORMIN) 25 MG tablet Take 1 tablet (25 mg total) by mouth daily. 90 tablet 3   b complex vitamins tablet Take 1 tablet by mouth daily.     Biotin 5000 MCG TABS Take 5,000 mcg by mouth daily.     Cholecalciferol (VITAMIN D) 2000 UNITS tablet Take 2,000 Units by mouth daily.     Coenzyme Q10 (CO Q 10) 100 MG CAPS Take 200 mg by mouth daily.      Cyanocobalamin (VITAMIN B-12 PO) Take 1 tablet by mouth daily.     dapagliflozin propanediol (FARXIGA) 10 MG TABS tablet TAKE 1 TABLET BY MOUTH DAILY BEFORE BREAKFAST. 90 tablet 0   Evolocumab (REPATHA SURECLICK) 140 MG/ML SOAJ Inject 1 pen  into the skin every 14 (fourteen) days. 6 mL 3   Ginkgo Biloba (GINKOBA PO) Take 1 tablet by mouth daily.     glucose blood (ONETOUCH VERIO) test strip USE AS DIRECTED to check blood sugar 1X daily 100 strip 3   icosapent Ethyl (VASCEPA) 1 g capsule Take 2 capsules (2 g total) by mouth 2 (two) times daily. 360 capsule 3   lisinopril (ZESTRIL) 40 MG tablet Take 1 tablet (40 mg total) by mouth daily. 90 tablet 3   Magnesium 400 MG CAPS Take 400 mg  by mouth daily.      metFORMIN (GLUCOPHAGE-XR) 500 MG 24 hr tablet Take 2 tablets (1,000 mg total) by mouth daily. 180 tablet 3   methocarbamol (ROBAXIN) 500 MG tablet Take 1 tablet (500 mg total) by mouth every 8 (eight) hours as needed for muscle spasms. 300 tablet 0   Multiple Vitamin (MULTIVITAMIN) tablet Take 1 tablet by mouth daily.     nitroGLYCERIN (NITROSTAT) 0.4 MG SL tablet Place 1 tablet (0.4 mg total) under the tongue every 5 (five) minutes as needed for chest pain. 30 tablet 11   pantoprazole (PROTONIX) 40 MG tablet TAKE 1 TABLET BY MOUTH EVERY DAY 90 tablet 3   rosuvastatin (CRESTOR) 5 MG tablet Take 0.5 tablets (2.5 mg total) by mouth daily. 45 tablet 3   Semaglutide (RYBELSUS) 14 MG TABS Take 1 tablet (14 mg total) by mouth daily. 90 tablet 3   testosterone (ANDROGEL) 50 MG/5GM (1%) GEL APPLY 50 MG (ONE PACKET) TOPICALLY DAILY. 150 g 2   Zinc 50 MG TABS Take 50 mg by mouth daily.     Current Facility-Administered Medications on File Prior to Visit  Medication Dose Route Frequency Provider Last Rate Last Admin   regadenoson (LEXISCAN) injection SOLN 0.4 mg  0.4 mg Intravenous Once Little Ishikawa, MD       Allergies  Allergen Reactions   Actos [Pioglitazone Hydrochloride]     Weight gain   Family History  Problem Relation Age of Onset   Colon cancer Mother        Colon Cancer-at advanced age- in her 71's per pt    Heart disease  Father    Breast cancer Sister    Esophageal cancer Neg Hx    Rectal cancer Neg Hx    Stomach cancer Neg Hx    PE: BP 124/82 (BP Location: Left Arm, Patient Position: Sitting, Cuff Size: Normal)   Pulse 82   Ht 5\' 7"  (1.702 m)   Wt 197 lb 3.2 oz (89.4 kg)   SpO2 94%   BMI 30.89 kg/m  Wt Readings from Last 3 Encounters:  04/08/23 197 lb 3.2 oz (89.4 kg)  03/27/23 196 lb (88.9 kg)  01/13/23 193 lb 1.6 oz (87.6 kg)   Constitutional: overweight, in NAD Eyes: PERRLA, EOMI, no exophthalmos ENT:  no thyromegaly, no cervical  lymphadenopathy Cardiovascular: RRR, No MRG Respiratory: CTA B Musculoskeletal: no deformities Skin:  no rashes Neurological: no tremor with outstretched hands Diabetic Foot Exam - Simple   Simple Foot Form Diabetic Foot exam was performed with the following findings: Yes 04/08/2023  2:23 PM  Visual Inspection No deformities, no ulcerations, no other skin breakdown bilaterally: Yes Sensation Testing Intact to touch and monofilament testing bilaterally: Yes Pulse Check Posterior Tibialis and Dorsalis pulse intact bilaterally: Yes Comments     ASSESSMENT: 1. DM2, noninsulin-dependent, uncontrolled, with complications - CAD  2. HL  PLAN:  1. Patient with longstanding, fairly well-controlled type 2 diabetes, on oral antidiabetic regimen with metformin, SGLT2 inhibitor and p.o. GLP-1 receptor agonist, with improved control at last 2 visits, but a slightly higher HbA1c obtained 2.5 months ago, at 6.2%. -At our last visit, sugars were at goal most of the time he was checking.  He had 1 mild low blood sugar at 67, but this was in the 90s when checked again on the other finger.  We did not change his regimen.  I did advise him to try to check his blood sugars more frequently. -At today's, he is checking blood sugars only approximately once a day, before dinner, sometimes after a snack.  These are at goal but we discussed about taking a different times of the day including Farxiga in the morning.  He agrees to do so.  For now, based on the HbA1c and the sugars before dinner, there is no need to change his regimen. - I suggested to:  Patient Instructions  Please continue: - Metformin ER 1000 mg 2x a day, with meals - Farxiga 10 mg before breakfast - Rybelsus 14 mg before breakfast  Please return in 6 months with your meter.  - we checked his HbA1c: 6.2% (stable) - advised to check sugars at different times of the day - 1x a day, rotating check times - advised for yearly eye exams >> he is  UTD - return to clinic in 6 months  2. HL -Reviewed latest lipid panel from 01/2023: Perplexing LDL level, at goal, though, besides elevated, HDL at goal: Lab Results  Component Value Date   CHOL 96 01/14/2023   HDL 52.30 01/14/2023   LDLCALC -3 (L) 01/10/2022   LDLDIRECT 26.0 01/14/2023   TRIG 222.0 (H) 01/14/2023   CHOLHDL 2 01/14/2023  -He is on Crestor 2.5 mg daily, Vascepa 2 g twice a day, Repatha, CoQ 10.  Carlus Pavlov, MD PhD [facility]

## 2023-04-08 NOTE — Patient Instructions (Signed)
Please continue: - Metformin ER 1000 mg 2x a day, with meals - Farxiga 10 mg before breakfast - Rybelsus 14 mg before breakfast  Please return in 6 months with your meter. 

## 2023-04-14 ENCOUNTER — Encounter: Payer: Self-pay | Admitting: Internal Medicine

## 2023-04-15 ENCOUNTER — Ambulatory Visit (INDEPENDENT_AMBULATORY_CARE_PROVIDER_SITE_OTHER): Payer: 59 | Admitting: Orthopaedic Surgery

## 2023-04-15 VITALS — BP 163/91

## 2023-04-15 DIAGNOSIS — M47812 Spondylosis without myelopathy or radiculopathy, cervical region: Secondary | ICD-10-CM | POA: Diagnosis not present

## 2023-04-15 NOTE — Progress Notes (Signed)
Office Visit Note   Patient: [name] [name]           Date of Birth: 08/04/1951           MRN: [id number] Visit Date: 04/15/2023              Requested by: Philip Aspen, Limmie Patricia, MD [address] [place],  [place] [address] PCP: Philip Aspen, Limmie Patricia, MD   Assessment & Plan: Visit Diagnoses:  1. Spondylosis without myelopathy or radiculopathy, cervical region     Plan: Patient has resolution of most of his symptoms he can return as an increased discomfort.  Follow-Up Instructions: No follow-ups on file.   Orders:  No orders of the defined types were placed in this encounter.  No orders of the defined types were placed in this encounter.     Procedures: No procedures performed   Clinical Data: No additional findings.   Subjective: Chief Complaint  Patient presents with   Neck - Follow-up    HPI 72 year old male returns states next been doing much better.  He spent some time letting his head hanging off the side of the bed and angle and this tends to give him some relief.  He does have spondylosis and we have reviewed MRI imaging with him in the past.  No fever chills no myelopathic symptoms no numbness or tingling in the fingers.  He does have retrolisthesis at C2-3, C3-4 and also C4-5.  He states at this point he has really minimal pain.  Review of Systems All of systems updated unchanged   Objective: Vital Signs: BP (!) 163/91   Physical Exam Constitutional:      Appearance: He is well-developed.  HENT:     Head: Normocephalic and atraumatic.     Right Ear: External ear normal.     Left Ear: External ear normal.  Eyes:     Pupils: Pupils are equal, round, and reactive to light.  Neck:     Thyroid: No thyromegaly.     Trachea: No tracheal deviation.  Cardiovascular:     Rate and Rhythm: Normal rate.  Pulmonary:     Effort: Pulmonary effort is normal.     Breath sounds: No wheezing.  Abdominal:     General: Bowel sounds are normal.      Palpations: Abdomen is soft.  Musculoskeletal:     Cervical back: Neck supple.  Skin:    General: Skin is warm and dry.     Capillary Refill: Capillary refill takes less than 2 seconds.  Neurological:     Mental Status: He is alert and oriented to person, place, and time.  Psychiatric:        Behavior: Behavior normal.        Thought Content: Thought content normal.        Judgment: Judgment normal.     Ortho Exam good cervical range of motion normal strength normal heel-toe gait.  Specialty Comments:  No specialty comments available.  Imaging: No results found.   PMFS History: Patient Active Problem List   Diagnosis Date Noted   Spondylosis without myelopathy or radiculopathy, cervical region 01/13/2023   Hyperlipidemia associated with type 2 diabetes mellitus (HCC) 10/11/2022   History of angiodysplasia of intestinal tract    Microcytic anemia 01/14/2022   Low testosterone 11/21/2017   S/P PTCA (percutaneous transluminal coronary angioplasty) 11/18/2015   Coronary artery disease due to lipid rich plaque 11/18/2015   Elevated LFTs 11/18/2015   Wellness examination  10/18/2015   Smoker 10/18/2015   HNP (herniated nucleus pulposus), lumbar 05/18/2014   Hypercalcemia 09/29/2013   NASH (nonalcoholic steatohepatitis) 08/28/2012   Screening for prostate cancer 08/28/2012   Hypogonadism male 08/28/2012   Testosterone deficiency in male 07/22/2011   Fatigue 07/05/2011   RBBB 10/12/2010   HEMORRHOIDS-INTERNAL 06/18/2010   ARTHRALGIA 01/18/2010   TOBACCO USER 07/11/2009   POSTURAL LIGHTHEADEDNESS 07/11/2009   DIVERTICULOSIS, COLON 11/18/2008   BACK PAIN, LUMBAR 09/01/2008   SWEATING 07/26/2008   Diabetes mellitus type II, controlled (HCC) 01/20/2008   COUGH 01/20/2008   Dyslipidemia 06/02/2007   Essential hypertension 06/02/2007   Coronary atherosclerosis 06/02/2007   GERD 06/02/2007   Past Medical History:  Diagnosis Date   Anemia, iron deficiency 11/18/2008    Resolved, despite no rx--2012     ANEMIA-IRON DEFICIENCY    Arthritis    BACK PAIN, LUMBAR    Blood transfusion without reported diagnosis    CORONARY ARTERY DISEASE    DIVERTICULOSIS, COLON    Elevated LFTs 11/18/2015   GERD    Hepatitis    States he was tested for Hep B and he had antibodies   HYPERLIPIDEMIA    HYPERTENSION    dr tom wall   Nephrolithiasis    PONV (postoperative nausea and vomiting)    little nausea after having his hip replacement   Type 2 diabetes mellitus (HCC)    Unspecified disorder of urethra and urinary tract     Family History  Problem Relation Age of Onset   Colon cancer Mother        Colon Cancer-at advanced age- in her 61's per pt    Heart disease Father    Breast cancer Sister    Esophageal cancer Neg Hx    Rectal cancer Neg Hx    Stomach cancer Neg Hx     Past Surgical History:  Procedure Laterality Date   COLONOSCOPY     CORONARY ANGIOPLASTY WITH STENT PLACEMENT     06    dr t. wall   ELECTROCARDIOGRAM  03/30/2007   ENTEROSCOPY N/A 04/30/2022   Procedure: ENTEROSCOPY;  Surgeon: Iva Boop, MD;  Location: [facility];  Service: Gastroenterology;  Laterality: N/A;   ESOPHAGOGASTRODUODENOSCOPY  11/07/2005   kidney stones     in office procedure to remove but no sedation surgeries for kidney stones    LUMBAR LAMINECTOMY Right 05/18/2014   Procedure: LUMBAR LAMINECTOMY Microdiscectomy Right Lumbar five - sacral one;  Surgeon: Eldred Manges, MD;  Location: [facility] OR;  Service: Orthopedics;  Laterality: Right;   Stress Cardiolite  11/30/2004   SUBMUCOSAL TATTOO INJECTION  04/30/2022   Procedure: SUBMUCOSAL TATTOO INJECTION;  Surgeon: Iva Boop, MD;  Location: [facility];  Service: Gastroenterology;;   TOTAL HIP ARTHROPLASTY  10/28/2012   Procedure: TOTAL HIP ARTHROPLASTY;  Surgeon: Loreta Ave, MD;  Location: [facility];  Service: Orthopedics;  Laterality: Right;   UPPER GASTROINTESTINAL ENDOSCOPY     Social History   Occupational  History   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  Vaping Use   Vaping  Use: Never used  Substance and Sexual Activity   Alcohol use: Yes    Alcohol/week: 0.0 standard drinks of alcohol    Comment: rare   Drug use: No   Sexual activity: Not Currently

## 2023-04-16 ENCOUNTER — Other Ambulatory Visit: Payer: Self-pay | Admitting: Internal Medicine

## 2023-04-16 DIAGNOSIS — K760 Fatty (change of) liver, not elsewhere classified: Secondary | ICD-10-CM

## 2023-04-16 DIAGNOSIS — D509 Iron deficiency anemia, unspecified: Secondary | ICD-10-CM

## 2023-04-16 NOTE — Progress Notes (Signed)
Lab workup to follow-up abnormal transaminases.  Patient has known fatty liver disease.  Also on testosterone question if that might be contributing.  Once we get the results we will determine in person follow-up.

## 2023-04-17 ENCOUNTER — Other Ambulatory Visit (INDEPENDENT_AMBULATORY_CARE_PROVIDER_SITE_OTHER): Payer: 59

## 2023-04-17 DIAGNOSIS — D509 Iron deficiency anemia, unspecified: Secondary | ICD-10-CM

## 2023-04-17 DIAGNOSIS — K760 Fatty (change of) liver, not elsewhere classified: Secondary | ICD-10-CM | POA: Diagnosis not present

## 2023-04-17 LAB — CBC
HCT: 51.4 % (ref 39.0–52.0)
Hemoglobin: 17.2 g/dL — ABNORMAL HIGH (ref 13.0–17.0)
MCHC: 33.5 g/dL (ref 30.0–36.0)
MCV: 97.1 fl (ref 78.0–100.0)
Platelets: 197 10*3/uL (ref 150.0–400.0)
RBC: 5.29 Mil/uL (ref 4.22–5.81)
RDW: 14.4 % (ref 11.5–15.5)
WBC: 9.2 10*3/uL (ref 4.0–10.5)

## 2023-04-17 LAB — HEPATIC FUNCTION PANEL
ALT: 57 U/L — ABNORMAL HIGH (ref 0–53)
AST: 33 U/L (ref 0–37)
Albumin: 4.7 g/dL (ref 3.5–5.2)
Alkaline Phosphatase: 53 U/L (ref 39–117)
Bilirubin, Direct: 0.1 mg/dL (ref 0.0–0.3)

## 2023-04-23 ENCOUNTER — Other Ambulatory Visit: Payer: Self-pay

## 2023-04-23 DIAGNOSIS — K760 Fatty (change of) liver, not elsewhere classified: Secondary | ICD-10-CM

## 2023-04-23 DIAGNOSIS — R748 Abnormal levels of other serum enzymes: Secondary | ICD-10-CM

## 2023-05-01 ENCOUNTER — Ambulatory Visit (HOSPITAL_COMMUNITY)
Admission: RE | Admit: 2023-05-01 | Discharge: 2023-05-01 | Disposition: A | Discharge status: Home / Self Care | Payer: 59 | Source: Ambulatory Visit | Attending: Internal Medicine | Admitting: Internal Medicine

## 2023-05-01 DIAGNOSIS — R748 Abnormal levels of other serum enzymes: Secondary | ICD-10-CM | POA: Diagnosis not present

## 2023-05-01 DIAGNOSIS — K760 Fatty (change of) liver, not elsewhere classified: Secondary | ICD-10-CM | POA: Insufficient documentation

## 2023-05-01 DIAGNOSIS — R7401 Elevation of levels of liver transaminase levels: Secondary | ICD-10-CM | POA: Diagnosis not present

## 2023-05-07 ENCOUNTER — Other Ambulatory Visit: Payer: Self-pay | Admitting: Pharmacist

## 2023-05-07 MED ORDER — REPATHA SURECLICK 140 MG/ML ~~LOC~~ SOAJ: 140.0000 mg | SUBCUTANEOUS | 3 refills | Status: DC

## 2023-05-16 ENCOUNTER — Telehealth: Payer: Self-pay | Admitting: Cardiology

## 2023-05-16 NOTE — Telephone Encounter (Signed)
Called pt to advised that Lajoyce Corners is not in the office this week.  Reports he wants her recommendation for a new cardiologist.  Reports was with Ivy and Dr. Delton See also.   RN asked pt about preference male or male.  Pt reports would rather discuss with Ivy since they go back a long time.   Will send to [facility] to address.

## 2023-05-16 NOTE — Telephone Encounter (Signed)
New Message:     Patient would like for Ivy to give him a call. He wants to get a recommendation from her.

## 2023-05-19 NOTE — Telephone Encounter (Signed)
[name] Sprague, MD  [name], LPN Caller: Unspecified (3 days ago,  1:15 PM) He can see Dr. Tyrone Apple the pt back and informed him that per Dr. Shari Prows, she recommends that he establish care with Dr. Izora Ribas.   Pt is due to be seen by our office in Nov of this year, for his 6 month OV.   Scheduled the pt to see Dr. Izora Ribas for 10/02/23 at 2 pm.  He is aware to arrive 15 mins prior to this appt.   Advised him to keep all his testing appts as scheduled with our office.   Pt verbalized understanding and agrees with this plan.

## 2023-05-19 NOTE — Telephone Encounter (Signed)
Will inquire from Dr. Pemberton who she suggest the pt establish with as his new Cardiologist.    Will follow-up with the pt accordingly thereafter. 

## 2023-06-15 ENCOUNTER — Other Ambulatory Visit: Payer: Self-pay | Admitting: Internal Medicine

## 2023-07-21 ENCOUNTER — Ambulatory Visit (HOSPITAL_COMMUNITY)
Admission: RE | Admit: 2023-07-21 | Discharge: 2023-07-21 | Disposition: A | Discharge status: Home / Self Care | Payer: 59 | Source: Ambulatory Visit | Attending: Cardiovascular Disease | Admitting: Cardiovascular Disease

## 2023-07-21 DIAGNOSIS — I6523 Occlusion and stenosis of bilateral carotid arteries: Secondary | ICD-10-CM | POA: Diagnosis not present

## 2023-07-23 DIAGNOSIS — C44629 Squamous cell carcinoma of skin of left upper limb, including shoulder: Secondary | ICD-10-CM | POA: Diagnosis not present

## 2023-07-24 ENCOUNTER — Other Ambulatory Visit: Payer: Self-pay

## 2023-07-24 DIAGNOSIS — I6523 Occlusion and stenosis of bilateral carotid arteries: Secondary | ICD-10-CM

## 2023-07-24 NOTE — Progress Notes (Unsigned)
Order placed for bilateral Carotid Ultrasound due Sept 2025.  Per result note from Sept 2024 testing.

## 2023-08-20 ENCOUNTER — Encounter: Payer: Self-pay | Admitting: Orthopaedic Surgery

## 2023-08-20 ENCOUNTER — Other Ambulatory Visit: Payer: Self-pay

## 2023-08-20 ENCOUNTER — Ambulatory Visit: Payer: 59 | Admitting: Orthopaedic Surgery

## 2023-08-20 VITALS — BP 134/87 | HR 70 | Ht 67.0 in | Wt 197.2 lb

## 2023-08-20 DIAGNOSIS — M4726 Other spondylosis with radiculopathy, lumbar region: Secondary | ICD-10-CM | POA: Diagnosis not present

## 2023-08-20 MED ORDER — PREDNISONE 5 MG (21) PO TBPK: ORAL_TABLET | ORAL | 0 refills | Status: DC

## 2023-08-21 NOTE — Progress Notes (Signed)
Office Visit Note   Patient: [name] [name]           Date of Birth: Sep 10, 1951           MRN: [id number] Visit Date: 08/20/2023              Requested by: Philip Aspen, Limmie Patricia, MD [address] [place],  [place] [address] PCP: Philip Aspen, Limmie Patricia, MD   Assessment & Plan: Visit Diagnoses:  1. Other spondylosis with radiculopathy, lumbar region     Plan: Reviewed his previous MRI.  Recheck if he has increased symptoms or is unable to heel and toe walk.  Previous scan reviewed where he has some scar tissue and had some mild recurrence or residual disc protrusion without significant compression.  Follow-Up Instructions: No follow-ups on file.   Orders:  Orders Placed This Encounter  Procedures   XR Lumbar Spine 2-3 Views   Meds ordered this encounter  Medications   predniSONE (STERAPRED UNI-PAK 21 TAB) 5 MG (21) TBPK tablet    Sig: Take 6,5,4,3,2,1 one tablet less each day , with food    Dispense:  21 tablet    Refill:  0      Procedures: No procedures performed   Clinical Data: No additional findings.   Subjective: Chief Complaint  Patient presents with   Right Leg - Pain    HPI 71 year old male returns with some recurrent right leg sciatica.  Pains been radiating down his ankle for 2 weeks he has some discomfort with walking sitting and standing he is use some Aleve with some improvement denies numbness or tingling just pain.  No weakness no falling.  Previous L5-S1 surgery okay left.  Previous  MRI scan 2023 showed some severe right neuroforaminal stenosis at L5-S1.  Review of Systems all systems update noncontributory to HPI.   Objective: Vital Signs: BP 134/87   Pulse 70   Ht 5\' 7"  (1.702 m)   Wt 197 lb 3.2 oz (89.4 kg)   BMI 30.89 kg/m   Physical Exam Constitutional:      Appearance: He is well-developed.  HENT:     Head: Normocephalic and atraumatic.     Right Ear: External ear normal.     Left Ear: External ear normal.   Eyes:     Pupils: Pupils are equal, round, and reactive to light.  Neck:     Thyroid: No thyromegaly.     Trachea: No tracheal deviation.  Cardiovascular:     Rate and Rhythm: Normal rate.  Pulmonary:     Effort: Pulmonary effort is normal.     Breath sounds: No wheezing.  Abdominal:     General: Bowel sounds are normal.     Palpations: Abdomen is soft.  Musculoskeletal:     Cervical back: Neck supple.  Skin:    General: Skin is warm and dry.     Capillary Refill: Capillary refill takes less than 2 seconds.  Neurological:     Mental Status: He is alert and oriented to person, place, and time.  Psychiatric:        Behavior: Behavior normal.        Thought Content: Thought content normal.        Judgment: Judgment normal.     Ortho Exam some pain with straight leg raising he is able to heel and toe walk.  Station the dorsum of the foot is intact.  Some tenderness at the peroneal nerve at the fibular neck.  Some sciatic notch tenderness on the right negative on the left.  Specialty Comments:  No specialty comments available.  Imaging: No results found.   PMFS History: Patient Active Problem List   Diagnosis Date Noted   Spondylosis without myelopathy or radiculopathy, cervical region 01/13/2023   Hyperlipidemia associated with type 2 diabetes mellitus (HCC) 10/11/2022   History of angiodysplasia of intestinal tract    Microcytic anemia 01/14/2022   Low testosterone 11/21/2017   S/P PTCA (percutaneous transluminal coronary angioplasty) 11/18/2015   Coronary artery disease due to lipid rich plaque 11/18/2015   Elevated LFTs 11/18/2015   Wellness examination 10/18/2015   Smoker 10/18/2015   HNP (herniated nucleus pulposus), lumbar 05/18/2014   Hypercalcemia 09/29/2013   NASH (nonalcoholic steatohepatitis) 08/28/2012   Screening for prostate cancer 08/28/2012   Hypogonadism male 08/28/2012   Testosterone deficiency in male 07/22/2011   Fatigue 07/05/2011   RBBB  10/12/2010   Internal hemorrhoids 06/18/2010   Pain in joint 01/18/2010   TOBACCO USER 07/11/2009   POSTURAL LIGHTHEADEDNESS 07/11/2009   Diverticulosis of colon 11/18/2008   BACK PAIN, LUMBAR 09/01/2008   SWEATING 07/26/2008   Diabetes mellitus type II, controlled (HCC) 01/20/2008   COUGH 01/20/2008   Dyslipidemia 06/02/2007   Essential hypertension 06/02/2007   Coronary atherosclerosis 06/02/2007   GERD 06/02/2007   Past Medical History:  Diagnosis Date   Anemia, iron deficiency 11/18/2008   Resolved, despite no rx--2012     ANEMIA-IRON DEFICIENCY    Arthritis    BACK PAIN, LUMBAR    Blood transfusion without reported diagnosis    CORONARY ARTERY DISEASE    DIVERTICULOSIS, COLON    Elevated LFTs 11/18/2015   GERD    Hepatitis    States he was tested for Hep B and he had antibodies   HYPERLIPIDEMIA    HYPERTENSION    dr tom wall   Nephrolithiasis    PONV (postoperative nausea and vomiting)    little nausea after having his hip replacement   Type 2 diabetes mellitus (HCC)    Unspecified disorder of urethra and urinary tract     Family History  Problem Relation Age of Onset   Colon cancer Mother        Colon Cancer-at advanced age- in her 71's per pt    Heart disease Father    Breast cancer Sister    Esophageal cancer Neg Hx    Rectal cancer Neg Hx    Stomach cancer Neg Hx     Past Surgical History:  Procedure Laterality Date   COLONOSCOPY     CORONARY ANGIOPLASTY WITH STENT PLACEMENT     06    dr t. wall   ELECTROCARDIOGRAM  03/30/2007   ENTEROSCOPY N/A 04/30/2022   Procedure: ENTEROSCOPY;  Surgeon: Iva Boop, MD;  Location: [facility];  Service: Gastroenterology;  Laterality: N/A;   ESOPHAGOGASTRODUODENOSCOPY  11/07/2005   kidney stones     in office procedure to remove but no sedation surgeries for kidney stones    LUMBAR LAMINECTOMY Right 05/18/2014   Procedure: LUMBAR LAMINECTOMY Microdiscectomy Right Lumbar five - sacral one;  Surgeon: Eldred Manges, MD;  Location: [facility];  Service: Orthopedics;  Laterality: Right;   Stress Cardiolite  11/30/2004   SUBMUCOSAL TATTOO INJECTION  04/30/2022   Procedure: SUBMUCOSAL TATTOO INJECTION;  Surgeon: Iva Boop, MD;  Location: [facility];  Service: Gastroenterology;;   TOTAL HIP ARTHROPLASTY  10/28/2012   Procedure: TOTAL HIP ARTHROPLASTY;  Surgeon: Loreta Ave, MD;  Location: [facility];  Service: Orthopedics;  Laterality: Right;   UPPER GASTROINTESTINAL ENDOSCOPY     Social History   Occupational History   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  Vaping Use   Vaping status: Never Used  Substance and Sexual Activity   Alcohol use: Yes    Alcohol/week: 0.0 standard drinks of alcohol    Comment: rare   Drug use: No   Sexual activity: Not Currently

## 2023-09-03 DIAGNOSIS — Z08 Encounter for follow-up examination after completed treatment for malignant neoplasm: Secondary | ICD-10-CM | POA: Diagnosis not present

## 2023-09-03 DIAGNOSIS — Z85828 Personal history of other malignant neoplasm of skin: Secondary | ICD-10-CM | POA: Diagnosis not present

## 2023-09-09 ENCOUNTER — Other Ambulatory Visit: Payer: Self-pay | Admitting: Internal Medicine

## 2023-09-14 ENCOUNTER — Other Ambulatory Visit: Payer: Self-pay | Admitting: Internal Medicine

## 2023-09-22 ENCOUNTER — Ambulatory Visit (HOSPITAL_COMMUNITY): Payer: 59 | Attending: Cardiovascular Disease

## 2023-09-22 ENCOUNTER — Encounter: Payer: Self-pay | Admitting: Internal Medicine

## 2023-09-22 DIAGNOSIS — I77819 Aortic ectasia, unspecified site: Secondary | ICD-10-CM | POA: Diagnosis not present

## 2023-09-22 DIAGNOSIS — Z9861 Coronary angioplasty status: Secondary | ICD-10-CM | POA: Diagnosis not present

## 2023-09-22 DIAGNOSIS — I10 Essential (primary) hypertension: Secondary | ICD-10-CM | POA: Insufficient documentation

## 2023-09-22 DIAGNOSIS — I2510 Atherosclerotic heart disease of native coronary artery without angina pectoris: Secondary | ICD-10-CM | POA: Diagnosis not present

## 2023-09-22 DIAGNOSIS — I2583 Coronary atherosclerosis due to lipid rich plaque: Secondary | ICD-10-CM | POA: Insufficient documentation

## 2023-09-22 LAB — ECHOCARDIOGRAM COMPLETE
Area-P 1/2: 3.6 cm2
S' Lateral: 2.7 cm

## 2023-10-02 ENCOUNTER — Encounter: Payer: Self-pay | Admitting: Internal Medicine

## 2023-10-02 ENCOUNTER — Ambulatory Visit: Payer: 59 | Attending: Internal Medicine | Admitting: Internal Medicine

## 2023-10-02 VITALS — BP 138/80 | HR 79 | Ht 67.0 in | Wt 187.8 lb

## 2023-10-02 DIAGNOSIS — F17200 Nicotine dependence, unspecified, uncomplicated: Secondary | ICD-10-CM

## 2023-10-02 DIAGNOSIS — I77819 Aortic ectasia, unspecified site: Secondary | ICD-10-CM

## 2023-10-02 DIAGNOSIS — I2583 Coronary atherosclerosis due to lipid rich plaque: Secondary | ICD-10-CM

## 2023-10-02 DIAGNOSIS — I6523 Occlusion and stenosis of bilateral carotid arteries: Secondary | ICD-10-CM | POA: Diagnosis not present

## 2023-10-02 DIAGNOSIS — I2510 Atherosclerotic heart disease of native coronary artery without angina pectoris: Secondary | ICD-10-CM | POA: Diagnosis not present

## 2023-10-02 DIAGNOSIS — I10 Essential (primary) hypertension: Secondary | ICD-10-CM | POA: Diagnosis not present

## 2023-10-02 NOTE — Patient Instructions (Signed)
Medication Instructions:  Your physician recommends that you continue on your current medications as directed. Please refer to the Current Medication list given to you today.  *If you need a refill on your cardiac medications before your next appointment, please call your pharmacy*  Lab Work: Your physician recommends that you have lab work today- lipid panel If you have labs (blood work) drawn today and your tests are completely normal, you will receive your results only by: MyChart Message (if you have MyChart) OR A paper copy in the mail If you have any lab test that is abnormal or we need to change your treatment, we will call you to review the results.  Testing/Procedures: Your physician has requested that you have an echocardiogram. Echocardiography is a painless test that uses sound waves to create images of your heart. It provides your doctor with information about the size and shape of your heart and how well your heart's chambers and valves are working. This procedure takes approximately one hour. There are no restrictions for this procedure. Please do NOT wear cologne, perfume, aftershave, or lotions (deodorant is allowed). Please arrive 15 minutes prior to your appointment time.  Please note: We ask at that you not bring children with you during ultrasound (echo/ vascular) testing. Due to room size and safety concerns, children are not allowed in the ultrasound rooms during exams. Our front office staff cannot provide observation of children in our lobby area while testing is being conducted. An adult accompanying a patient to their appointment will only be allowed in the ultrasound room at the discretion of the ultrasound technician under special circumstances. We apologize for any inconvenience. Your physician has requested that you have a carotid duplex. This test is an ultrasound of the carotid arteries in your neck. It looks at blood flow through these arteries that supply the  brain with blood. Allow one hour for this exam. There are no restrictions or special instructions.   Follow-Up: At [facility], you and your health needs are our priority.  As part of our continuing mission to provide you with exceptional heart care, we have created designated Provider Care Teams.  These Care Teams include your primary Cardiologist (physician) and Advanced Practice Providers (APPs -  Physician Assistants and Nurse Practitioners) who all work together to provide you with the care you need, when you need it.  We recommend signing up for the patient portal called "MyChart".  Sign up information is provided on this After Visit Summary.  MyChart is used to connect with patients for Virtual Visits (Telemedicine).  Patients are able to view lab/test results, encounter notes, upcoming appointments, etc.  Non-urgent messages can be sent to your provider as well.   To learn more about what you can do with MyChart, go to [email or web address].    Your next appointment:   1 year   Provider:   Christell Constant, MD

## 2023-10-02 NOTE — Progress Notes (Signed)
Cardiology Office Note:    Date:  10/02/2023   ID:  DEMOND TAGUCHI, DOB 1951/02/25, MRN [id number]  PCP:  Philip Aspen, Limmie Patricia, MD   [facility] Providers Cardiologist:  Christell Constant, MD {  Referring MD: Philip Aspen, [name]*   History of Present Illness:    [name] [name] is a 72 y.o. male with a hx of CAD s/p PCI following positive stress test in 2006, HTN, HLD, DMII and tobacco use who was previously followed by Dr. Delton See who now returns to clinic for follow-up with Dr. Shari Prows. Had GI bleeds. 2023: Found to have mild aortic dilation  Discussed the use of AI scribe software for clinical note transcription with the patient, who gave verbal consent to proceed.  Mr. Lender is a 72 year old individual with a history of coronary artery disease, status post percutaneous coronary intervention (PCI) in 2006, hypertension, hyperlipidemia, type 2 diabetes, and a former smoking habit. He was previously followed by two different physicians and had a history of gastrointestinal bleeding and mild aortic dilation, which has since resolved. Recent testing for the aortic dilation showed a measurement of 42 millimeters without evidence of significant aortic regurgitation. He also had some aortic root dilation.  Despite these conditions, he has been doing fairly well and has achieved controlled LDL levels with aggressive therapy for hyperlipidemia, including Repatha, low-dose Crestor, and Vascepa. He also had evidence of mild carotid artery stenosis, for which a repeat study was recommended in one year due to a high plaque burden.  During the consultation, he reported no new symptoms or concerns. He mentioned that his statin had to be lowered due to extremely low cholesterol levels, and he was awaiting new blood work results since the adjustment. He also inquired about the possibility of discontinuing his low-dose atenolol, a beta-blocker, but was advised to continue it due to  slightly elevated blood pressure.  In summary, Mr. Barrere is a 72 year old individual with multiple cardiovascular risk factors and conditions, who has been managing well with his current treatment regimen. He has shown good compliance with his medications and has achieved controlled LDL levels. He has mild aortic and carotid artery dilation, which are being monitored regularly. He continues to smoke and has declined assistance with cessation at this time.   Past Medical History:  Diagnosis Date   Anemia, iron deficiency 11/18/2008   Resolved, despite no rx--2012     ANEMIA-IRON DEFICIENCY    Arthritis    BACK PAIN, LUMBAR    Blood transfusion without reported diagnosis    CORONARY ARTERY DISEASE    DIVERTICULOSIS, COLON    Elevated LFTs 11/18/2015   GERD    Hepatitis    States he was tested for Hep B and he had antibodies   HYPERLIPIDEMIA    HYPERTENSION    dr tom wall   Nephrolithiasis    PONV (postoperative nausea and vomiting)    little nausea after having his hip replacement   Type 2 diabetes mellitus (HCC)    Unspecified disorder of urethra and urinary tract     Past Surgical History:  Procedure Laterality Date   COLONOSCOPY     CORONARY ANGIOPLASTY WITH STENT PLACEMENT     06    dr t. wall   ELECTROCARDIOGRAM  03/30/2007   ENTEROSCOPY N/A 04/30/2022   Procedure: ENTEROSCOPY;  Surgeon: Iva Boop, MD;  Location: [facility];  Service: Gastroenterology;  Laterality: N/A;   ESOPHAGOGASTRODUODENOSCOPY  11/07/2005   kidney stones  in office procedure to remove but no sedation surgeries for kidney stones    LUMBAR LAMINECTOMY Right 05/18/2014   Procedure: LUMBAR LAMINECTOMY Microdiscectomy Right Lumbar five - sacral one;  Surgeon: Eldred Manges, MD;  Location: [facility] OR;  Service: Orthopedics;  Laterality: Right;   Stress Cardiolite  11/30/2004   SUBMUCOSAL TATTOO INJECTION  04/30/2022   Procedure: SUBMUCOSAL TATTOO INJECTION;  Surgeon: Iva Boop, MD;  Location: [facility];  Service: Gastroenterology;;   TOTAL HIP ARTHROPLASTY  10/28/2012   Procedure: TOTAL HIP ARTHROPLASTY;  Surgeon: Loreta Ave, MD;  Location: [facility] OR;  Service: Orthopedics;  Laterality: Right;   UPPER GASTROINTESTINAL ENDOSCOPY      Current Medications: Current Meds  Medication Sig   Ascorbic Acid (VITAMIN C) 1000 MG tablet Take 2,000 mg by mouth daily.   aspirin EC 81 MG tablet Take 1 tablet (81 mg total) by mouth daily.   atenolol (TENORMIN) 25 MG tablet Take 1 tablet (25 mg total) by mouth daily.   b complex vitamins tablet Take 1 tablet by mouth daily.   Biotin 5000 MCG TABS Take 5,000 mcg by mouth daily.   Cholecalciferol (VITAMIN D) 2000 UNITS tablet Take 2,000 Units by mouth daily.   Coenzyme Q10 (CO Q 10) 100 MG CAPS Take 200 mg by mouth daily.    Cyanocobalamin (VITAMIN B-12 PO) Take 1 tablet by mouth daily.   Evolocumab (REPATHA SURECLICK) 140 MG/ML SOAJ Inject 140 mg into the skin every 14 (fourteen) days.   FARXIGA 10 MG TABS tablet TAKE 1 TABLET BY MOUTH EVERY DAY BEFORE BREAKFAST   Ginkgo Biloba (GINKOBA PO) Take 1 tablet by mouth daily.   glucose blood (ONETOUCH VERIO) test strip USE AS DIRECTED to check blood sugar 1X daily   icosapent Ethyl (VASCEPA) 1 g capsule Take 2 capsules (2 g total) by mouth 2 (two) times daily.   lisinopril (ZESTRIL) 40 MG tablet Take 1 tablet (40 mg total) by mouth daily.   Magnesium 400 MG CAPS Take 400 mg by mouth daily.    metFORMIN (GLUCOPHAGE-XR) 500 MG 24 hr tablet Take 2 tablets (1,000 mg total) by mouth daily.   methocarbamol (ROBAXIN) 500 MG tablet Take 1 tablet (500 mg total) by mouth every 8 (eight) hours as needed for muscle spasms.   Multiple Vitamin (MULTIVITAMIN) tablet Take 1 tablet by mouth daily.   nitroGLYCERIN (NITROSTAT) 0.4 MG SL tablet Place 1 tablet (0.4 mg total) under the tongue every 5 (five) minutes as needed for chest pain.   pantoprazole (PROTONIX) 40 MG tablet TAKE 1 TABLET BY MOUTH EVERY DAY    predniSONE (STERAPRED UNI-PAK 21 TAB) 5 MG (21) TBPK tablet Take 6,5,4,3,2,1 one tablet less each day , with food   rosuvastatin (CRESTOR) 5 MG tablet Take 0.5 tablets (2.5 mg total) by mouth daily.   Semaglutide (RYBELSUS) 14 MG TABS Take 1 tablet (14 mg total) by mouth daily.   testosterone (ANDROGEL) 50 MG/5GM (1%) GEL APPLY 50 MG (ONE PACKET) TOPICALLY DAILY.   Zinc 50 MG TABS Take 50 mg by mouth daily.     Allergies:   Actos [pioglitazone hydrochloride]   Social History   Socioeconomic History   Marital status: Single    Spouse name: Not on file   Number of children: 0   Years of education: Not on file   Highest education level: Not on file  Occupational History   Occupation: Adult nurse    Employer: [employer]    Comment: works Hospital doctor  [employer]   Occupation: Retired  Tobacco Use   Smoking status: Every Day    Current packs/day: 1.00    Average packs/day: 1 pack/day for 48.0 years (48.0 ttl pk-yrs)    Types: Cigarettes   Smokeless tobacco: Never   Tobacco comments:    states he wants to reduce his dependence.  Vaping Use   Vaping status: Never Used  Substance and Sexual Activity   Alcohol use: Yes    Alcohol/week: 0.0 standard drinks of alcohol    Comment: rare   Drug use: No   Sexual activity: Not Currently  Other Topics Concern   Not on file  Social History Narrative   Originally from [address], [place]   Lives alone with dog, has a couple of friends he can rely on for support.    Social Determinants of Health   Financial Resource Strain: Low Risk  (01/13/2023)   Overall Financial Resource Strain (CARDIA)    Difficulty of Paying Living Expenses: Not hard at all  Food Insecurity: No Food Insecurity (01/13/2023)   Hunger Vital Sign    Worried About Running Out of Food in the Last Year: Never true    Ran Out of Food in the Last Year: Never true  Transportation Needs: No Transportation Needs (01/13/2023)   PRAPARE - Administrator, Civil Service  (Medical): No    Lack of Transportation (Non-Medical): No  Physical Activity: Insufficiently Active (01/13/2023)   Exercise Vital Sign    Days of Exercise per Week: 2 days    Minutes of Exercise per Session: 30 min  Stress: No Stress Concern Present (01/13/2023)   [employer] of Occupational Health - Occupational Stress Questionnaire    Feeling of Stress : Not at all  Social Connections: Socially Isolated (01/13/2023)   Social Connection and Isolation Panel [NHANES]    Frequency of Communication with Friends and Family: Twice a week    Frequency of Social Gatherings with Friends and Family: Twice a week    Attends Religious Services: Never    Database administrator or Organizations: No    Attends Engineer, structural: Never    Marital Status: Never married     Family History: The patient's family history includes Breast cancer in his sister; Colon cancer in his mother; Heart disease in his father. There is no history of Esophageal cancer, Rectal cancer, or Stomach cancer.  ROS:   Please see the history of present illness.     EKGs/Labs/Other Studies Reviewed:    Cardiac Studies & Procedures     STRESS TESTS  MYOCARDIAL PERFUSION IMAGING 08/27/2019  Narrative  Inconclusive ECG with significant motion artifact. Diffuse upsloping ST depressions, appear to become horizontal in V3 and V4 leads.  Nuclear stress EF: 60%.  The left ventricular ejection fraction is normal (55-65%).  Defect 1: There is a medium fixed defect of mild severity present in the basal inferior, mid inferior and apical inferior location. Given normal wall motion in this region, consistent with artifact  The study is normal.  This is a low risk study.   ECHOCARDIOGRAM  ECHOCARDIOGRAM COMPLETE 09/22/2023  Narrative ECHOCARDIOGRAM REPORT    Patient Name:   BRAYCEN SUTHERLAND Date of Exam: 09/22/2023 Medical Rec #:  [id number]       Height:       67.0 in Accession #:    [id number]       Weight:       197.2 lb Date of Birth:  Jul 13, 1951       BSA:          2.010 m Patient Age:    72 years        BP:           134/87 mmHg Patient Gender: M               HR:           79 bpm. Exam Location:  [address]  Procedure: 2D Echo, 3D Echo, Cardiac Doppler and Color Doppler  Indications:    I77.819 Aortic Dilation  History:        Patient has prior history of Echocardiogram examinations, most recent 10/08/2022. CAD; Risk Factors:Current Smoker, Hypertension, Diabetes, Dyslipidemia and Family History of Coronary Artery Disease.  Sonographer:    Farrel Conners RDCS Referring Phys: Kathlynn Grate PEMBERTON  IMPRESSIONS   1. Left ventricular ejection fraction, by estimation, is 55 to 60%. The left ventricle has normal function. The left ventricle has no regional wall motion abnormalities. Left ventricular diastolic parameters are consistent with Grade I diastolic dysfunction (impaired relaxation). 2. Right ventricular systolic function is normal. The right ventricular size is normal. There is normal pulmonary artery systolic pressure. 3. The mitral valve is normal in structure. No evidence of mitral valve regurgitation. No evidence of mitral stenosis. 4. The aortic valve is tricuspid. Aortic valve regurgitation is not visualized. No aortic stenosis is present. 5. Aortic dilatation noted. There is mild dilatation of the ascending aorta, measuring 40 mm. There is mild dilatation of the aortic root, measuring 42 mm. 6. The inferior vena cava is normal in size with greater than 50% respiratory variability, suggesting right atrial pressure of 3 mmHg.  FINDINGS Left Ventricle: Left ventricular ejection fraction, by estimation, is 55 to 60%. The left ventricle has normal function. The left ventricle has no regional wall motion abnormalities. The left ventricular internal cavity size was normal in size. There is no left ventricular hypertrophy. Left ventricular diastolic parameters are  consistent with Grade I diastolic dysfunction (impaired relaxation). Indeterminate filling pressures.  Right Ventricle: The right ventricular size is normal. No increase in right ventricular wall thickness. Right ventricular systolic function is normal. There is normal pulmonary artery systolic pressure. The tricuspid regurgitant velocity is 1.75 m/s, and with an assumed right atrial pressure of 3 mmHg, the estimated right ventricular systolic pressure is 15.2 mmHg.  Left Atrium: Left atrial size was normal in size.  Right Atrium: Right atrial size was normal in size.  Pericardium: There is no evidence of pericardial effusion.  Mitral Valve: The mitral valve is normal in structure. No evidence of mitral valve regurgitation. No evidence of mitral valve stenosis.  Tricuspid Valve: The tricuspid valve is normal in structure. Tricuspid valve regurgitation is trivial. No evidence of tricuspid stenosis.  Aortic Valve: The aortic valve is tricuspid. Aortic valve regurgitation is not visualized. No aortic stenosis is present.  Pulmonic Valve: The pulmonic valve was normal in structure. Pulmonic valve regurgitation is not visualized. No evidence of pulmonic stenosis.  Aorta: Aortic dilatation noted. There is mild dilatation of the ascending aorta, measuring 40 mm. There is mild dilatation of the aortic root, measuring 42 mm.  Venous: The inferior vena cava is normal in size with greater than 50% respiratory variability, suggesting right atrial pressure of 3 mmHg.  IAS/Shunts: No atrial level shunt detected by color flow Doppler.   LEFT VENTRICLE PLAX 2D LVIDd:         5.10 cm  Diastology LVIDs:         2.70 cm   LV e' medial:    7.72 cm/s LV PW:         0.90 cm   LV E/e' medial:  10.3 LV IVS:        1.00 cm   LV e' lateral:   7.46 cm/s LVOT diam:     2.60 cm   LV E/e' lateral: 10.7 LVOT Area:     5.31 cm  3D Volume EF: 3D EF:        53 % LV EDV:       123 ml LV ESV:       57 ml LV SV:         66 ml  RIGHT VENTRICLE RV Basal diam:  3.95 cm RV S prime:     12.90 cm/s RVSP:           15.2 mmHg  LEFT ATRIUM             Index        RIGHT ATRIUM           Index LA diam:        3.60 cm 1.79 cm/m   RA Pressure: 3.00 mmHg LA Vol (A2C):   50.5 ml 25.12 ml/m  RA Area:     17.60 cm LA Vol (A4C):   53.6 ml 26.66 ml/m  RA Volume:   48.20 ml  23.98 ml/m LA Biplane Vol: 57.1 ml 28.41 ml/m  AORTA Ao Root diam: 4.20 cm Ao Asc diam:  4.15 cm  MITRAL VALVE                TRICUSPID VALVE MV Area (PHT): cm          TR Peak grad:   12.2 mmHg MV Decel Time: 211 msec     TR Vmax:        175.00 cm/s MV E velocity: 79.70 cm/s   Estimated RAP:  3.00 mmHg MV A velocity: 109.00 cm/s  RVSP:           15.2 mmHg MV E/A ratio:  0.73 SHUNTS Systemic Diam: 2.60 cm  Chilton Si MD Electronically signed by Chilton Si MD Signature Date/Time: 09/22/2023/4:02:53 PM    Final                EKG:   No new tracing   Recent Labs: 02/27/2023: BUN 17; Creatinine, Ser 0.87; Potassium 4.4; Sodium 142 04/17/2023: ALT 57; Hemoglobin 17.2; Platelets 197.0   Recent Lipid Panel    Component Value Date/Time   CHOL 96 01/14/2023 1354   CHOL 86 (L) 09/11/2020 1456   TRIG 222.0 (H) 01/14/2023 1354   HDL 52.30 01/14/2023 1354   HDL 57 09/11/2020 1456   CHOLHDL 2 01/14/2023 1354   VLDL 44.4 (H) 01/14/2023 1354   LDLCALC -3 (L) 01/10/2022 1348   LDLCALC 11 09/11/2020 1456   LDLDIRECT 26.0 01/14/2023 1354     Physical Exam:    VS:  BP 138/80   Pulse 79   Ht 5\' 7"  (1.702 m)   Wt 187 lb 12.8 oz (85.2 kg)   SpO2 95%   BMI 29.41 kg/m     Wt Readings from Last 3 Encounters:  10/02/23 187 lb 12.8 oz (85.2 kg)  08/20/23 197 lb 3.2 oz (89.4 kg)  04/08/23 197 lb 3.2 oz (89.4 kg)     GEN:  Well nourished, well developed in no acute distress HEENT:  Normal NECK: No JVD; soft carotid bruits CARDIAC: RRR, no murmurs, rubs, gallops RESPIRATORY:  CTAB, good air movement ABDOMEN:  Soft, non-tender, non-distended MUSCULOSKELETAL:  No edema; No deformity  SKIN: Warm and dry NEUROLOGIC:  Alert and oriented x 3 PSYCHIATRIC:  Normal affect   ASSESSMENT:    1. Coronary artery disease due to lipid rich plaque   2. Essential hypertension   3. Bilateral carotid artery stenosis   4. Dilation of aorta (HCC)   5. Smoker      PLAN:    Coronary Artery Disease - Coronary artery disease, status post PCI in 2006. Currently asymptomatic with no chest pain, pressure, or shortness of breath. Blood pressure slightly elevated but controlled at home. Discussed potential benefits and risks of discontinuing atenolol, including the need for regular blood pressure monitoring and the possibility of increased energy levels. He prefers to continue current regimen. - Continue aspirin therapy - Monitor blood pressure regularly at home - if he wishes to come off atenolol, would check BP and monitor for chest pain  Carotid Artery Stenosis Mild carotid artery stenosis with high plaque burden. Previous recommendation to repeat study in one year. Discussed importance of monitoring due to high plaque burden. - Repeat carotid duplex in September 2025  Hypertension - Hypertension managed with lisinopril and atenolol. Blood pressure slightly elevated during visit. Discussed potential benefits and risks of discontinuing atenolol, including the need for regular blood pressure monitoring and the possibility of increased energy levels. He prefers to continue current regimen. - Continue lisinopril - Monitor blood pressure regularly at home - Consider discontinuing atenolol if blood pressure remains controlled and asymptomatic  Hyperlipidemia - Hyperlipidemia managed with Repatha, low-dose Crestor, and Vascepa. Previous statin myalgias led to reduction in statin dose. Plan to recheck lipid levels. Discussed that undetectable cholesterol levels have no negative outcomes based on studies, and the  possibility of discontinuing atorvastatin if levels remain undetectable. - Recheck lipid panel today - Consider discontinuing atorvastatin if cholesterol remains in essence undetectable  Type 2 Diabetes Mellitus Type 2 diabetes mellitus, currently well-controlled. - Continue current diabetes management regimen  Smoking Cessation - Former smoker, currently not interested in smoking cessation interventions. Aware of pros and cons of smoking cessation. Discussed alternative methods like hypnosis if condition worsens.  Mild Aortic Dilation - Mild dilation of the aorta and aortic root, measuring 42 mm without significant aortic regurgitation. Discussed potential progression to thoracic aortic aneurysm if dilation reaches 45 mm, necessitating more aggressive follow-up and possible CT scans. Informed about symptoms of aortic dissection (crushing pain radiating to the back) and advised immediate hospital visit if these occur. - Repeat echocardiogram in one year - Educate on symptoms of aortic dissection and advise immediate hospital visit if these occur  General Health Maintenance General health maintenance discussed, including the importance of regular monitoring and follow-up for chronic conditions. - Ensure echocardiogram and carotid duplex are ordered - Schedule follow-up visit in one year unless new symptoms arise  Follow-up - Schedule follow-up visit in one year   Medication Adjustments/Labs and Tests Ordered: Current medicines are reviewed at length with the patient today.  Concerns regarding medicines are outlined above.   Orders Placed This Encounter  Procedures   Lipid panel   EKG 12-Lead   ECHOCARDIOGRAM COMPLETE   VAS US CAROTID   No orders of the defined types were placed in this encounter.  Patient Instructions  Medication Instructions:  Your physician recommends that you continue on your current medications as directed. Please  refer to the Current Medication list given  to you today.  *If you need a refill on your cardiac medications before your next appointment, please call your pharmacy*  Lab Work: Your physician recommends that you have lab work today- lipid panel If you have labs (blood work) drawn today and your tests are completely normal, you will receive your results only by: MyChart Message (if you have MyChart) OR A paper copy in the mail If you have any lab test that is abnormal or we need to change your treatment, we will call you to review the results.  Testing/Procedures: Your physician has requested that you have an echocardiogram. Echocardiography is a painless test that uses sound waves to create images of your heart. It provides your doctor with information about the size and shape of your heart and how well your heart's chambers and valves are working. This procedure takes approximately one hour. There are no restrictions for this procedure. Please do NOT wear cologne, perfume, aftershave, or lotions (deodorant is allowed). Please arrive 15 minutes prior to your appointment time.  Please note: We ask at that you not bring children with you during ultrasound (echo/ vascular) testing. Due to room size and safety concerns, children are not allowed in the ultrasound rooms during exams. Our front office staff cannot provide observation of children in our lobby area while testing is being conducted. An adult accompanying a patient to their appointment will only be allowed in the ultrasound room at the discretion of the ultrasound technician under special circumstances. We apologize for any inconvenience. Your physician has requested that you have a carotid duplex. This test is an ultrasound of the carotid arteries in your neck. It looks at blood flow through these arteries that supply the brain with blood. Allow one hour for this exam. There are no restrictions or special instructions.   Follow-Up: At [facility], you and your health  needs are our priority.  As part of our continuing mission to provide you with exceptional heart care, we have created designated Provider Care Teams.  These Care Teams include your primary Cardiologist (physician) and Advanced Practice Providers (APPs -  Physician Assistants and Nurse Practitioners) who all work together to provide you with the care you need, when you need it.  We recommend signing up for the patient portal called "MyChart".  Sign up information is provided on this After Visit Summary.  MyChart is used to connect with patients for Virtual Visits (Telemedicine).  Patients are able to view lab/test results, encounter notes, upcoming appointments, etc.  Non-urgent messages can be sent to your provider as well.   To learn more about what you can do with MyChart, go to [email or web address].    Your next appointment:   1 year   Provider:   Christell Constant, MD         Signed, Christell Constant, MD  10/02/2023 2:34 PM    [facility]

## 2023-10-03 ENCOUNTER — Other Ambulatory Visit: Payer: Self-pay | Admitting: Internal Medicine

## 2023-10-03 DIAGNOSIS — R7989 Other specified abnormal findings of blood chemistry: Secondary | ICD-10-CM

## 2023-10-03 LAB — LIPID PANEL
Chol/HDL Ratio: 2.1 ratio (ref 0.0–5.0)
Cholesterol, Total: 116 mg/dL (ref 100–199)
HDL: 56 mg/dL (ref >39)
LDL Chol Calc (NIH): 26 mg/dL (ref 0–99)
Triglycerides: 223 mg/dL — ABNORMAL HIGH (ref 0–149)
VLDL Cholesterol Cal: 34 mg/dL (ref 5–40)

## 2023-10-06 ENCOUNTER — Telehealth: Payer: Self-pay

## 2023-10-06 DIAGNOSIS — E782 Mixed hyperlipidemia: Secondary | ICD-10-CM

## 2023-10-06 NOTE — Telephone Encounter (Signed)
The patient has been notified of the result and verbalized understanding.  All questions (if any) were answered. [name] Burows, RN 10/06/2023 3:56 PM   Pt wants to continue statin medication.  Pt concerned about elevated triglycerides 223 wants to know what can be done to lower number.  Advised will send a message to our Lipid clinic to advise.

## 2023-10-06 NOTE — Telephone Encounter (Signed)
-----   Message from Christell Constant sent at 10/04/2023  7:32 PM EST ----- Regarding: FW: He now has LDL that is calculable. At this point, I would recommend continuing his current regimen. If he wishes to come off the statin that is reasonable. ----- Message ----- From: Interface, [facility] Lab Results In Sent: 10/03/2023   1:35 AM EST To: Christell Constant, MD

## 2023-10-06 NOTE — Telephone Encounter (Signed)
Call patient to discuss TG lowering therapy. We discussed lifestyle in detail Exercise: none willing to start some cardio and resistance 3-5 times per week  Diet :  eats in moderation lost weight 10 lbs, his BG improved but he has to go on prednisone for his sciatica pain - BG was elevated during steroid treatment. Alcohol: none   Current TG lowering therapy Crestor 2.5 mg daily and Vascepa 2 gm twice daily. Last LFT was slightly elevated in June. Will get updated LFTs Nov 26,2024. Tricor will be the next option to consider to optimize TG level.

## 2023-10-07 DIAGNOSIS — E782 Mixed hyperlipidemia: Secondary | ICD-10-CM | POA: Diagnosis not present

## 2023-10-08 LAB — HEPATIC FUNCTION PANEL
ALT: 32 [IU]/L (ref 0–44)
AST: 22 [IU]/L (ref 0–40)
Albumin: 4.6 g/dL (ref 3.8–4.8)
Alkaline Phosphatase: 65 [IU]/L (ref 44–121)
Bilirubin Total: 0.3 mg/dL (ref 0.0–1.2)
Bilirubin, Direct: 0.16 mg/dL (ref 0.00–0.40)
Total Protein: 6.9 g/dL (ref 6.0–8.5)

## 2023-10-13 ENCOUNTER — Encounter: Payer: Self-pay | Admitting: Internal Medicine

## 2023-10-13 ENCOUNTER — Ambulatory Visit (INDEPENDENT_AMBULATORY_CARE_PROVIDER_SITE_OTHER): Payer: 59 | Admitting: Internal Medicine

## 2023-10-13 VITALS — BP 122/70 | HR 79 | Ht 67.0 in | Wt 185.0 lb

## 2023-10-13 DIAGNOSIS — E1159 Type 2 diabetes mellitus with other circulatory complications: Secondary | ICD-10-CM

## 2023-10-13 DIAGNOSIS — E785 Hyperlipidemia, unspecified: Secondary | ICD-10-CM | POA: Diagnosis not present

## 2023-10-13 DIAGNOSIS — Z7984 Long term (current) use of oral hypoglycemic drugs: Secondary | ICD-10-CM

## 2023-10-13 DIAGNOSIS — E1169 Type 2 diabetes mellitus with other specified complication: Secondary | ICD-10-CM | POA: Diagnosis not present

## 2023-10-13 LAB — POCT GLYCOSYLATED HEMOGLOBIN (HGB A1C): Hemoglobin A1C: 5.9 % — AB (ref 4.0–5.6)

## 2023-10-13 MED ORDER — DAPAGLIFLOZIN PROPANEDIOL 10 MG PO TABS: 10.0000 mg | ORAL_TABLET | Freq: Every day | ORAL | Status: DC

## 2023-10-13 NOTE — Addendum Note (Signed)
Addended by: Pollie Meyer on: 10/13/2023 03:13 PM   Modules accepted: Orders

## 2023-10-13 NOTE — Progress Notes (Signed)
Patient ID: [name] [name], male   DOB: February 09, 1951, 72 y.o.   MRN: [id number]  HPI: [name] [name] is a 72 y.o.-year-old male, returning for follow-up for DM2, dx in 2008, non-insulin-dependent, uncontrolled, with circulatory complications (CAD). Pt. previously saw Dr. Everardo All, but last visit with me 7 months ago.  Interim history: No increased urination, blurry vision, nausea, chest pain. He lost 12 pounds since last visit.  He feels Rybelsus is cutting down his appetite but no bothersome GI symptoms.  He is happy that he is losing weight. He does have back pain: Spondylosis with radiculopathy. He had oral steroids 1-2 mo ago >> sugars increased to >200, but then improved.  Reviewed HbA1c: Lab Results  Component Value Date   HGBA1C 6.2 (A) 04/08/2023   HGBA1C 6.2 01/14/2023   HGBA1C 5.9 (A) 10/11/2022   HGBA1C 5.8 (A) 04/11/2022   HGBA1C 7.1 (H) 01/10/2022   HGBA1C 6.8 (A) 09/26/2021   HGBA1C 6.8 (A) 03/22/2021   HGBA1C 7.1 (H) 01/09/2021   HGBA1C 6.2 (H) 09/11/2020   HGBA1C 6.7 (A) 05/16/2020   Pt is on a regimen of: - Metformin ER 1000 mg with dinner - Farxiga 10 mg before breakfast - Rybelsus 14 mg before breakfast He tried Actos but this caused weight gain.  Pt was checking his sugars 0 to once a day, but not in last 1-2 months as his meter stopped working:         - am: 144 >> n/c >> ? - 2h after b'fast: n/c - before lunch: n/c >> 143 >> n/c - 2h after lunch: n/c >> 120 >> n/c - before dinner:67-102 >> 84-104  >> 80s-125 - 2h after dinner: n/c >> 99 >> n/c - bedtime: n/c - nighttime: n/c Lowest sugar was 91 >> 67 >> 84 >> 80s; he has hypoglycemia awareness at 70.  Highest sugar was 144 >> 143 >> 104 >> 200s (steroids). He stays up until 2-3 am.   Glucometer: One Touch Verio  - no CKD, last BUN/creatinine:  Lab Results  Component Value Date   BUN 17 02/27/2023   BUN 16 01/14/2023   CREATININE 0.87 02/27/2023   CREATININE 0.85 01/14/2023   Lab Results   Component Value Date   MICRALBCREAT 1.5 01/14/2023   MICRALBCREAT 2.6 08/02/2022   MICRALBCREAT 1.1 11/19/2016   MICRALBCREAT 2.3 10/18/2015   MICRALBCREAT 0.4 10/14/2014   MICRALBCREAT 0.8 09/27/2013   MICRALBCREAT 5.3 08/28/2012   MICRALBCREAT 0.2 06/28/2011  On lisinopril 40 mg daily.  - + HL; last set of lipids: Lab Results  Component Value Date   CHOL 116 10/02/2023   HDL 56 10/02/2023   LDLCALC 26 10/02/2023   LDLDIRECT 26.0 01/14/2023   TRIG 223 (H) 10/02/2023   CHOLHDL 2.1 10/02/2023  On Repatha, Vascepa 2 g twice a day, Crestor 2.5 mg daily, and co-Q10.  - last eye exam was in 12/11/2022: No DR reportedly. Dr. Lorin Picket. He had cataract sx. 11/2022.  - no numbness and tingling in his feet.  Last foot exam 04/08/2023.  He is on ASA 81.  He does have a history of NASH-on vitamin E, anemia, HTN, hypogonadism-on testosterone injections, history of nephrolithiasis, diverticulosis.  ROS: + see HPI  Past Medical History:  Diagnosis Date   Anemia, iron deficiency 11/18/2008   Resolved, despite no rx--2012     ANEMIA-IRON DEFICIENCY    Arthritis    BACK PAIN, LUMBAR    Blood transfusion without reported diagnosis    CORONARY ARTERY  DISEASE    DIVERTICULOSIS, COLON    Elevated LFTs 11/18/2015   GERD    Hepatitis    States he was tested for Hep B and he had antibodies   HYPERLIPIDEMIA    HYPERTENSION    dr tom wall   Nephrolithiasis    PONV (postoperative nausea and vomiting)    little nausea after having his hip replacement   Type 2 diabetes mellitus (HCC)    Unspecified disorder of urethra and urinary tract    Past Surgical History:  Procedure Laterality Date   COLONOSCOPY     CORONARY ANGIOPLASTY WITH STENT PLACEMENT     06    dr t. wall   ELECTROCARDIOGRAM  03/30/2007   ENTEROSCOPY N/A 04/30/2022   Procedure: ENTEROSCOPY;  Surgeon: Iva Boop, MD;  Location: [facility];  Service: Gastroenterology;  Laterality: N/A;   ESOPHAGOGASTRODUODENOSCOPY   11/07/2005   kidney stones     in office procedure to remove but no sedation surgeries for kidney stones    LUMBAR LAMINECTOMY Right 05/18/2014   Procedure: LUMBAR LAMINECTOMY Microdiscectomy Right Lumbar five - sacral one;  Surgeon: Eldred Manges, MD;  Location: [facility] OR;  Service: Orthopedics;  Laterality: Right;   Stress Cardiolite  11/30/2004   SUBMUCOSAL TATTOO INJECTION  04/30/2022   Procedure: SUBMUCOSAL TATTOO INJECTION;  Surgeon: Iva Boop, MD;  Location: [facility];  Service: Gastroenterology;;   TOTAL HIP ARTHROPLASTY  10/28/2012   Procedure: TOTAL HIP ARTHROPLASTY;  Surgeon: Loreta Ave, MD;  Location: [facility] OR;  Service: Orthopedics;  Laterality: Right;   UPPER GASTROINTESTINAL ENDOSCOPY     Social History   Socioeconomic History   Marital status: Single    Spouse name: Not on file   Number of children: 0   Years of education: Not on file   Highest education level: Not on file  Occupational History   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  Vaping Use   Vaping status: Never Used  Substance and Sexual Activity   Alcohol use: Yes    Alcohol/week: 0.0 standard drinks of alcohol    Comment: rare   Drug use: No   Sexual activity: Not Currently  Other Topics Concern   Not on file  Social History Narrative   Originally from [address], [place]   Lives alone with dog, has a couple of friends he can rely on for support.    Social Determinants of Health   Financial Resource Strain: Low Risk  (01/13/2023)   Overall Financial Resource Strain (CARDIA)    Difficulty of Paying Living Expenses: Not hard at all  Food Insecurity: No Food Insecurity (01/13/2023)    Hunger Vital Sign    Worried About Running Out of Food in the Last Year: Never true    Ran Out of Food in the Last Year: Never true  Transportation Needs: No Transportation Needs (01/13/2023)   PRAPARE - Administrator, Civil Service (Medical): No    Lack of Transportation (Non-Medical): No  Physical Activity: Insufficiently Active (01/13/2023)   Exercise Vital Sign    Days of Exercise per Week:  2 days    Minutes of Exercise per Session: 30 min  Stress: No Stress Concern Present (01/13/2023)   [employer] of Occupational Health - Occupational Stress Questionnaire    Feeling of Stress : Not at all  Social Connections: Socially Isolated (01/13/2023)   Social Connection and Isolation Panel [NHANES]    Frequency of Communication with Friends and Family: Twice a week    Frequency of Social Gatherings with Friends and Family: Twice a week    Attends Religious Services: Never    Database administrator or Organizations: No    Attends Banker Meetings: Never    Marital Status: Never married  Intimate Partner Violence: Not At Risk (01/13/2023)   Humiliation, Afraid, Rape, and Kick questionnaire    Fear of Current or Ex-Partner: No    Emotionally Abused: No    Physically Abused: No    Sexually Abused: No   Current Outpatient Medications on File Prior to Visit  Medication Sig Dispense Refill   Ascorbic Acid (VITAMIN C) 1000 MG tablet Take 2,000 mg by mouth daily.     aspirin EC 81 MG tablet Take 1 tablet (81 mg total) by mouth daily. 30 tablet 11   atenolol (TENORMIN) 25 MG tablet Take 1 tablet (25 mg total) by mouth daily. 90 tablet 3   b complex vitamins tablet Take 1 tablet by mouth daily.     Biotin 5000 MCG TABS Take 5,000 mcg by mouth daily.     Cholecalciferol (VITAMIN D) 2000 UNITS tablet Take 2,000 Units by mouth daily.     Coenzyme Q10 (CO Q 10) 100 MG CAPS Take 200 mg by mouth daily.      Cyanocobalamin (VITAMIN B-12 PO) Take 1 tablet by mouth daily.      Evolocumab (REPATHA SURECLICK) 140 MG/ML SOAJ Inject 140 mg into the skin every 14 (fourteen) days. 6 mL 3   FARXIGA 10 MG TABS tablet TAKE 1 TABLET BY MOUTH EVERY DAY BEFORE BREAKFAST 90 tablet 0   Ginkgo Biloba (GINKOBA PO) Take 1 tablet by mouth daily.     glucose blood (ONETOUCH VERIO) test strip USE AS DIRECTED to check blood sugar 1X daily 100 strip 3   icosapent Ethyl (VASCEPA) 1 g capsule Take 2 capsules (2 g total) by mouth 2 (two) times daily. 360 capsule 3   lisinopril (ZESTRIL) 40 MG tablet Take 1 tablet (40 mg total) by mouth daily. 90 tablet 3   Magnesium 400 MG CAPS Take 400 mg by mouth daily.      metFORMIN (GLUCOPHAGE-XR) 500 MG 24 hr tablet Take 2 tablets (1,000 mg total) by mouth daily. 180 tablet 3   methocarbamol (ROBAXIN) 500 MG tablet Take 1 tablet (500 mg total) by mouth every 8 (eight) hours as needed for muscle spasms. 300 tablet 0   Multiple Vitamin (MULTIVITAMIN) tablet Take 1 tablet by mouth daily.     nitroGLYCERIN (NITROSTAT) 0.4 MG SL tablet Place 1 tablet (0.4 mg total) under the tongue every 5 (five) minutes as needed for chest pain. 30 tablet 11   pantoprazole (PROTONIX) 40 MG tablet TAKE 1 TABLET BY MOUTH EVERY DAY 90 tablet 3   predniSONE (STERAPRED UNI-PAK 21 TAB) 5 MG (21) TBPK tablet Take 6,5,4,3,2,1 one tablet less each day , with food 21 tablet 0   rosuvastatin (CRESTOR) 5 MG tablet Take 0.5 tablets (2.5 mg total) by mouth daily. 45 tablet 3   Semaglutide (RYBELSUS) 14 MG TABS Take 1 tablet (14 mg  total) by mouth daily. 90 tablet 3   testosterone (ANDROGEL) 50 MG/5GM (1%) GEL APPLY 50 MG (ONE PACKET) TOPICALLY DAILY. 150 g 0   Zinc 50 MG TABS Take 50 mg by mouth daily.     Current Facility-Administered Medications on File Prior to Visit  Medication Dose Route Frequency Provider Last Rate Last Admin   regadenoson (LEXISCAN) injection SOLN 0.4 mg  0.4 mg Intravenous Once Little Ishikawa, MD       Allergies  Allergen Reactions   Actos  [Pioglitazone Hydrochloride]     Weight gain   Family History  Problem Relation Age of Onset   Colon cancer Mother        Colon Cancer-at advanced age- in her 17's per pt    Heart disease Father    Breast cancer Sister    Esophageal cancer Neg Hx    Rectal cancer Neg Hx    Stomach cancer Neg Hx    PE: BP 122/70   Pulse 79   Ht 5\' 7"  (1.702 m)   Wt 185 lb (83.9 kg)   SpO2 94%   BMI 28.98 kg/m  Wt Readings from Last 3 Encounters:  10/13/23 185 lb (83.9 kg)  10/02/23 187 lb 12.8 oz (85.2 kg)  08/20/23 197 lb 3.2 oz (89.4 kg)   Constitutional: overweight, in NAD Eyes: PERRLA, EOMI, no exophthalmos ENT:  no thyromegaly, no cervical lymphadenopathy Cardiovascular: RRR, No MRG Respiratory: CTA B Musculoskeletal: no deformities Skin:  no rashes Neurological: no tremor with outstretched hands  ASSESSMENT: 1. DM2, noninsulin-dependent, uncontrolled, with complications - CAD  2. HL  PLAN:  1. Patient with longstanding, fairly well-controlled type 2 diabetes, on oral antidiabetic regimen with metformin, SGLT2 inhibitor and GLP-1 receptor agonist with stable control at last visit, when HbA1c returned 6.2%.  He was checking blood sugars only once a day at that time, before dinner, sometimes after the snack.  These were at goal but we discussed about checking sugars at different times of the day, rotating check times.  We did not change his regimen at that time -At today's visit, he is not checking blood sugars as he does not have a meter anymore we will give him a meter today.  Advised him to check at different times of the day, rotating check times.  Before his meter stopped working, his sugars were mostly at goal.  He had some higher blood sugars with prednisone, but they improved right after he finished the course. -He does mention decreased appetite but is not interested in reducing the Rybelsus dose.  However, at next visit, we may decrease the dose of metformin. - I suggested to:   Patient Instructions  Please continue: - Metformin ER 1000 mg 2x a day, with meals - Farxiga 10 mg before breakfast - Rybelsus 14 mg before breakfast  Please return in 4-6 months with your meter.  - we checked his HbA1c: 5.9% (lower, excellent) - advised to check sugars at different times of the day - 1x a day, rotating check times - advised for yearly eye exams >> he is UTD - return to clinic in 6 months  2. HL -Reviewed the latest lipid panel from 09/2023: Fractions at goal with exception of a high triglyceride level: Lab Results  Component Value Date   CHOL 116 10/02/2023   HDL 56 10/02/2023   LDLCALC 26 10/02/2023   LDLDIRECT 26.0 01/14/2023   TRIG 223 (H) 10/02/2023   CHOLHDL 2.1 10/02/2023  -He is on Crestor  2.5 mg daily, Vascepa 2 g twice a day, Repatha, co-Q10  Carlus Pavlov, MD PhD [facility]

## 2023-10-13 NOTE — Patient Instructions (Addendum)
Please continue: - Metformin ER 1000 mg 2x a day, with meals - Farxiga 10 mg before breakfast - Rybelsus 14 mg before breakfast  Please return in 4-6 months with your meter.

## 2023-10-16 ENCOUNTER — Encounter: Payer: Self-pay | Admitting: Physician Assistant

## 2023-10-16 ENCOUNTER — Other Ambulatory Visit (INDEPENDENT_AMBULATORY_CARE_PROVIDER_SITE_OTHER): Payer: 59

## 2023-10-16 ENCOUNTER — Ambulatory Visit (INDEPENDENT_AMBULATORY_CARE_PROVIDER_SITE_OTHER): Payer: 59 | Admitting: Physician Assistant

## 2023-10-16 DIAGNOSIS — M25561 Pain in right knee: Secondary | ICD-10-CM | POA: Diagnosis not present

## 2023-10-16 DIAGNOSIS — M25551 Pain in right hip: Secondary | ICD-10-CM | POA: Diagnosis not present

## 2023-10-16 NOTE — Progress Notes (Signed)
Office Visit Note   Patient: [name] [name]           Date of Birth: 09-19-1951           MRN: [id number] Visit Date: 10/16/2023              Requested by: Philip Aspen, Limmie Patricia, MD [address] [place],  [place] [address] PCP: Philip Aspen, Limmie Patricia, MD   Assessment & Plan: Visit Diagnoses:  1. Pain in right hip   2. Acute pain of right knee     Plan: [name] [name] is a pleasant 72 year old gentleman who is a patient of Dr. Ophelia Charter.  He is status post right total hip arthroplasty done a while back.  He comes in today because about 2 weeks ago he missed a step and fell onto his right leg.  He was able to ambulate at the time.  He is complaining of pain from the right hip to the right knee.  He does have some associated bruising in the quadricep as well as a healing abrasion on the knee.  X-rays are reassuring and he actually has no pain with manipulation of his hip he has resolving ecchymosis and no significant pain in his knee.  I have recommended continued observation and treating this symptomatically.  He is admitting that he is getting a little bit better may follow-up with me as needed in a couple weeks  Follow-Up Instructions: 2 weeks  Orders:  Orders Placed This Encounter  Procedures   XR KNEE 3 VIEW RIGHT   XR Pelvis 1-2 Views   No orders of the defined types were placed in this encounter.     Procedures: No procedures performed   Clinical Data: No additional findings.   Subjective: No chief complaint on file.   HPI [name] [name] is a pleasant 72 year old gentleman presents today with a chief complaint of right knee pain and right hip pain.  He did take a fall when he missed a step.  He has had pain since feels like he cannot bend his leg up without pain.  Denies any radicular symptoms denies any weakness or loss of bowel or bladder control  Review of Systems  All other systems reviewed and are negative.    Objective: Vital Signs: There were no vitals  taken for this visit.  Physical Exam Constitutional:      Appearance: Normal appearance.  Pulmonary:     Effort: Pulmonary effort is normal.  Skin:    General: Skin is warm and dry.  Neurological:     Mental Status: He is alert.  Psychiatric:        Mood and Affect: Mood normal.        Behavior: Behavior normal.     Ortho Exam Examination of his right lower back no pain he does have some pain along the IT band.  Excellent strength he has no pain with manipulation of his leg no tenderness in the distal quadricep although he does have some resolving ecchymosis on the medial side of the thigh he has good strength with resisted extension and flexion of his legs.  No tenderness over the knee good stability he has a resolving abrasion without any signs of infection Specialty Comments:  No specialty comments available.  Imaging: No results found.   PMFS History: Patient Active Problem List   Diagnosis Date Noted   Pain in right knee 10/16/2023   Bilateral carotid artery stenosis 10/02/2023   Dilation of aorta (HCC) 10/02/2023  Spondylosis without myelopathy or radiculopathy, cervical region 01/13/2023   Hyperlipidemia associated with type 2 diabetes mellitus (HCC) 10/11/2022   History of angiodysplasia of intestinal tract    Microcytic anemia 01/14/2022   Low testosterone 11/21/2017   S/P PTCA (percutaneous transluminal coronary angioplasty) 11/18/2015   Coronary artery disease due to lipid rich plaque 11/18/2015   Elevated LFTs 11/18/2015   Wellness examination 10/18/2015   Smoker 10/18/2015   HNP (herniated nucleus pulposus), lumbar 05/18/2014   Hypercalcemia 09/29/2013   NASH (nonalcoholic steatohepatitis) 08/28/2012   Screening for prostate cancer 08/28/2012   Hypogonadism male 08/28/2012   Testosterone deficiency in male 07/22/2011   Fatigue 07/05/2011   RBBB 10/12/2010   Internal hemorrhoids 06/18/2010   Pain in right hip 01/18/2010   TOBACCO USER 07/11/2009    POSTURAL LIGHTHEADEDNESS 07/11/2009   Diverticulosis of colon 11/18/2008   BACK PAIN, LUMBAR 09/01/2008   SWEATING 07/26/2008   Diabetes mellitus type II, controlled (HCC) 01/20/2008   COUGH 01/20/2008   Dyslipidemia 06/02/2007   Essential hypertension 06/02/2007   Coronary atherosclerosis 06/02/2007   GERD 06/02/2007   Past Medical History:  Diagnosis Date   Anemia, iron deficiency 11/18/2008   Resolved, despite no rx--2012     ANEMIA-IRON DEFICIENCY    Arthritis    BACK PAIN, LUMBAR    Blood transfusion without reported diagnosis    CORONARY ARTERY DISEASE    DIVERTICULOSIS, COLON    Elevated LFTs 11/18/2015   GERD    Hepatitis    States he was tested for Hep B and he had antibodies   HYPERLIPIDEMIA    HYPERTENSION    dr tom wall   Nephrolithiasis    PONV (postoperative nausea and vomiting)    little nausea after having his hip replacement   Type 2 diabetes mellitus (HCC)    Unspecified disorder of urethra and urinary tract     Family History  Problem Relation Age of Onset   Colon cancer Mother        Colon Cancer-at advanced age- in her 87's per pt    Heart disease Father    Breast cancer Sister    Esophageal cancer Neg Hx    Rectal cancer Neg Hx    Stomach cancer Neg Hx     Past Surgical History:  Procedure Laterality Date   COLONOSCOPY     CORONARY ANGIOPLASTY WITH STENT PLACEMENT     06    dr t. wall   ELECTROCARDIOGRAM  03/30/2007   ENTEROSCOPY N/A 04/30/2022   Procedure: ENTEROSCOPY;  Surgeon: Iva Boop, MD;  Location: [facility];  Service: Gastroenterology;  Laterality: N/A;   ESOPHAGOGASTRODUODENOSCOPY  11/07/2005   kidney stones     in office procedure to remove but no sedation surgeries for kidney stones    LUMBAR LAMINECTOMY Right 05/18/2014   Procedure: LUMBAR LAMINECTOMY Microdiscectomy Right Lumbar five - sacral one;  Surgeon: Eldred Manges, MD;  Location: [facility] OR;  Service: Orthopedics;  Laterality: Right;   Stress Cardiolite  11/30/2004    SUBMUCOSAL TATTOO INJECTION  04/30/2022   Procedure: SUBMUCOSAL TATTOO INJECTION;  Surgeon: Iva Boop, MD;  Location: [facility];  Service: Gastroenterology;;   TOTAL HIP ARTHROPLASTY  10/28/2012   Procedure: TOTAL HIP ARTHROPLASTY;  Surgeon: Loreta Ave, MD;  Location: [facility] OR;  Service: Orthopedics;  Laterality: Right;   UPPER GASTROINTESTINAL ENDOSCOPY     Social History   Occupational History   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  Vaping Use   Vaping status: Never Used  Substance and Sexual Activity   Alcohol use: Yes    Alcohol/week: 0.0 standard drinks of alcohol    Comment: rare   Drug use: No   Sexual activity: Not Currently

## 2023-10-20 ENCOUNTER — Other Ambulatory Visit: Payer: Self-pay | Admitting: *Deleted

## 2023-10-20 DIAGNOSIS — I10 Essential (primary) hypertension: Secondary | ICD-10-CM

## 2023-10-20 DIAGNOSIS — E785 Hyperlipidemia, unspecified: Secondary | ICD-10-CM

## 2023-10-20 MED ORDER — LISINOPRIL 40 MG PO TABS: 40.0000 mg | ORAL_TABLET | Freq: Every day | ORAL | 3 refills | Status: DC

## 2023-10-21 ENCOUNTER — Telehealth: Payer: Self-pay | Admitting: Internal Medicine

## 2023-10-21 DIAGNOSIS — E782 Mixed hyperlipidemia: Secondary | ICD-10-CM

## 2023-10-21 MED ORDER — ICOSAPENT ETHYL 1 G PO CAPS: 2.0000 g | ORAL_CAPSULE | Freq: Two times a day (BID) | ORAL | 3 refills | Status: DC

## 2023-10-21 NOTE — Telephone Encounter (Signed)
*  STAT* If patient is at the pharmacy, call can be transferred to refill team.   1. Which medications need to be refilled? (please list name of each medication and dose if known)   icosapent Ethyl (VASCEPA) 1 g capsule   2. Would you like to learn more about the convenience, safety, & potential cost savings by using the [facility]?   3. Are you open to using the [facility] (Type [facility]. ).  4. Which pharmacy/location (including street and city if local pharmacy) is medication to be sent to?  [id number] - [place], [place] - [address]   5. Do they need a 30 day or 90 day supply?   90 day  Patient stated he still has some medication.

## 2023-10-21 NOTE — Telephone Encounter (Signed)
Pt called to f/u plan for cholesterol management. Advised pt will send a message to Pharmacist to f/u.

## 2023-10-21 NOTE — Telephone Encounter (Signed)
Patient called to follow-up on his test results. 

## 2023-10-22 MED ORDER — FENOFIBRATE 145 MG PO TABS: 145.0000 mg | ORAL_TABLET | Freq: Every day | ORAL | 3 refills | Status: DC

## 2023-10-22 NOTE — Telephone Encounter (Signed)
LDLc at goal , TG still above goal while him being on Vascepa 2 gm twice daily, reports does not drink alcohol and follow low fat low carb diet.  Will optimize therapy by adding Tricor 145 mg daily. F/u lab due on Feb 19. Patient made aware.

## 2023-10-30 ENCOUNTER — Ambulatory Visit: Payer: 59 | Admitting: Physician Assistant

## 2023-10-30 ENCOUNTER — Encounter: Payer: Self-pay | Admitting: Physician Assistant

## 2023-10-30 DIAGNOSIS — M25561 Pain in right knee: Secondary | ICD-10-CM

## 2023-10-30 NOTE — Progress Notes (Signed)
Office Visit Note   Patient: [name] [name]           Date of Birth: Aug 30, 1951           MRN: [id number] Visit Date: 10/30/2023              Requested by: [name] [name], [name] Patricia, MD [address] [place],  [place] [address] PCP: [name] [name], [name] Patricia, MD  Chief Complaint  Patient presents with   Right Hip - Follow-up      HPI: [name] [name] is a pleasant 72 year old gentleman who presents in follow-up today for his right leg and hip.  He had taken a fall onto his right side a couple weeks ago.  He had significant ecchymosis especially in his inner thigh and over his knee.  He reports still some stiffness in his knee however he is feeling better.  He feels the bruising is resolving.  Assessment & Plan: Visit Diagnoses: Right leg pain  Plan: May follow-up as needed  Follow-Up Instructions: No follow-ups on file.   Ortho Exam  Patient is alert, oriented, no adenopathy, well-dressed, normal affect, normal respiratory effort. Right lower extremity resolving ecchymosis on the inner thigh and the buttock.  He has good range of motion no effusion no redness compartments are soft and nontender is neurovascularly intact strength intact ambulating without an antalgic gait  Imaging: No results found. No images are attached to the encounter.  Labs: Lab Results  Component Value Date   HGBA1C 5.9 (A) 10/13/2023   HGBA1C 6.2 (A) 04/08/2023   HGBA1C 6.2 01/14/2023   ESRSEDRATE 3 06/10/2012   ESRSEDRATE 8 07/19/2011   ESRSEDRATE 10 03/09/2010   LABURIC 5.0 07/19/2011     Lab Results  Component Value Date   ALBUMIN 4.6 10/07/2023   ALBUMIN 4.7 04/17/2023   ALBUMIN 4.7 02/27/2023    No results found for: "MG" Lab Results  Component Value Date   VD25OH 53.53 01/09/2021   VD25OH 41.27 12/23/2019   VD25OH 43.86 12/16/2018    No results found for: "PREALBUMIN"    Latest Ref Rng & Units 04/17/2023    2:55 PM 01/14/2023    1:54 PM 09/18/2022    3:00 PM  CBC  EXTENDED  WBC 4.0 - 10.5 K/uL 9.2  8.2  8.8   RBC 4.22 - 5.81 Mil/uL 5.29  5.23  5.48   Hemoglobin 13.0 - 17.0 g/dL 25.3  66.4  40.3   HCT 39.0 - 52.0 % 51.4  50.6  52.9   Platelets 150.0 - 400.0 K/uL 197.0  248.0  207   NEUT# 1.4 - 7.7 K/uL  5.6  5.5   Lymph# 0.7 - 4.0 K/uL  1.7  2.3      There is no height or weight on file to calculate BMI.  Orders:  No orders of the defined types were placed in this encounter.  No orders of the defined types were placed in this encounter.    Procedures: No procedures performed  Clinical Data: No additional findings.  ROS:  All other systems negative, except as noted in the HPI. Review of Systems  Objective: Vital Signs: There were no vitals taken for this visit.  Specialty Comments:  No specialty comments available.  PMFS History: Patient Active Problem List   Diagnosis Date Noted   Pain in right knee 10/16/2023   Bilateral carotid artery stenosis 10/02/2023   Dilation of aorta (HCC) 10/02/2023   Spondylosis without myelopathy or radiculopathy, cervical region 01/13/2023  Hyperlipidemia associated with type 2 diabetes mellitus (HCC) 10/11/2022   History of angiodysplasia of intestinal tract    Microcytic anemia 01/14/2022   Low testosterone 11/21/2017   S/P PTCA (percutaneous transluminal coronary angioplasty) 11/18/2015   Coronary artery disease due to lipid rich plaque 11/18/2015   Elevated LFTs 11/18/2015   Wellness examination 10/18/2015   Smoker 10/18/2015   HNP (herniated nucleus pulposus), lumbar 05/18/2014   Hypercalcemia 09/29/2013   NASH (nonalcoholic steatohepatitis) 08/28/2012   Screening for prostate cancer 08/28/2012   Hypogonadism male 08/28/2012   Testosterone deficiency in male 07/22/2011   Fatigue 07/05/2011   RBBB 10/12/2010   Internal hemorrhoids 06/18/2010   Pain in right hip 01/18/2010   TOBACCO USER 07/11/2009   POSTURAL LIGHTHEADEDNESS 07/11/2009   Diverticulosis of colon 11/18/2008   BACK  PAIN, LUMBAR 09/01/2008   SWEATING 07/26/2008   Diabetes mellitus type II, controlled (HCC) 01/20/2008   COUGH 01/20/2008   Dyslipidemia 06/02/2007   Essential hypertension 06/02/2007   Coronary atherosclerosis 06/02/2007   GERD 06/02/2007   Past Medical History:  Diagnosis Date   Anemia, iron deficiency 11/18/2008   Resolved, despite no rx--2012     ANEMIA-IRON DEFICIENCY    Arthritis    BACK PAIN, LUMBAR    Blood transfusion without reported diagnosis    CORONARY ARTERY DISEASE    DIVERTICULOSIS, COLON    Elevated LFTs 11/18/2015   GERD    Hepatitis    States he was tested for Hep B and he had antibodies   HYPERLIPIDEMIA    HYPERTENSION    dr tom wall   Nephrolithiasis    PONV (postoperative nausea and vomiting)    little nausea after having his hip replacement   Type 2 diabetes mellitus (HCC)    Unspecified disorder of urethra and urinary tract     Family History  Problem Relation Age of Onset   Colon cancer Mother        Colon Cancer-at advanced age- in her 46's per pt    Heart disease Father    Breast cancer Sister    Esophageal cancer Neg Hx    Rectal cancer Neg Hx    Stomach cancer Neg Hx     Past Surgical History:  Procedure Laterality Date   COLONOSCOPY     CORONARY ANGIOPLASTY WITH STENT PLACEMENT     06    dr t. wall   ELECTROCARDIOGRAM  03/30/2007   ENTEROSCOPY N/A 04/30/2022   Procedure: ENTEROSCOPY;  Surgeon: [name] Boop, MD;  Location: [facility];  Service: Gastroenterology;  Laterality: N/A;   ESOPHAGOGASTRODUODENOSCOPY  11/07/2005   kidney stones     in office procedure to remove but no sedation surgeries for kidney stones    LUMBAR LAMINECTOMY Right 05/18/2014   Procedure: LUMBAR LAMINECTOMY Microdiscectomy Right Lumbar five - sacral one;  Surgeon: [name] Manges, MD;  Location: [facility] OR;  Service: Orthopedics;  Laterality: Right;   Stress Cardiolite  11/30/2004   SUBMUCOSAL TATTOO INJECTION  04/30/2022   Procedure: SUBMUCOSAL TATTOO INJECTION;   Surgeon: [name] Boop, MD;  Location: [facility];  Service: Gastroenterology;;   TOTAL HIP ARTHROPLASTY  10/28/2012   Procedure: TOTAL HIP ARTHROPLASTY;  Surgeon: [name] Ave, MD;  Location: [facility] OR;  Service: Orthopedics;  Laterality: Right;   UPPER GASTROINTESTINAL ENDOSCOPY     Social History   Occupational History   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  Vaping Use   Vaping status: Never Used  Substance and Sexual Activity   Alcohol use: Yes    Alcohol/week: 0.0 standard drinks of alcohol    Comment: rare   Drug use: No   Sexual activity: Not Currently

## 2023-11-18 ENCOUNTER — Other Ambulatory Visit: Payer: Self-pay

## 2023-11-18 MED ORDER — ATENOLOL 25 MG PO TABS: 25.0000 mg | ORAL_TABLET | Freq: Every day | ORAL | 3 refills | Status: DC

## 2023-11-24 ENCOUNTER — Other Ambulatory Visit: Payer: Self-pay | Admitting: Internal Medicine

## 2023-11-24 DIAGNOSIS — E1159 Type 2 diabetes mellitus with other circulatory complications: Secondary | ICD-10-CM

## 2023-11-28 ENCOUNTER — Other Ambulatory Visit: Payer: Self-pay | Admitting: Internal Medicine

## 2023-11-28 DIAGNOSIS — R7989 Other specified abnormal findings of blood chemistry: Secondary | ICD-10-CM

## 2023-12-05 ENCOUNTER — Other Ambulatory Visit: Payer: Self-pay | Admitting: Internal Medicine

## 2023-12-11 DIAGNOSIS — H2513 Age-related nuclear cataract, bilateral: Secondary | ICD-10-CM | POA: Diagnosis not present

## 2023-12-11 LAB — HM DIABETES EYE EXAM

## 2023-12-24 ENCOUNTER — Encounter: Payer: Self-pay | Admitting: Internal Medicine

## 2023-12-29 ENCOUNTER — Other Ambulatory Visit: Payer: Self-pay

## 2023-12-29 DIAGNOSIS — E782 Mixed hyperlipidemia: Secondary | ICD-10-CM

## 2023-12-30 LAB — LIPID PANEL
Chol/HDL Ratio: 1.9 {ratio} (ref 0.0–5.0)
Cholesterol, Total: 108 mg/dL (ref 100–199)
HDL: 56 mg/dL (ref >39)
LDL Chol Calc (NIH): 30 mg/dL (ref 0–99)
Triglycerides: 130 mg/dL (ref 0–149)
VLDL Cholesterol Cal: 22 mg/dL (ref 5–40)

## 2023-12-30 LAB — HEPATIC FUNCTION PANEL
ALT: 26 [IU]/L (ref 0–44)
AST: 26 [IU]/L (ref 0–40)
Albumin: 4.9 g/dL — ABNORMAL HIGH (ref 3.8–4.8)
Alkaline Phosphatase: 49 [IU]/L (ref 44–121)
Bilirubin Total: 0.3 mg/dL (ref 0.0–1.2)
Bilirubin, Direct: 0.14 mg/dL (ref 0.00–0.40)
Total Protein: 7.2 g/dL (ref 6.0–8.5)

## 2023-12-31 ENCOUNTER — Encounter: Payer: Self-pay | Admitting: Internal Medicine

## 2024-01-14 ENCOUNTER — Ambulatory Visit (INDEPENDENT_AMBULATORY_CARE_PROVIDER_SITE_OTHER): Payer: 59 | Admitting: Internal Medicine

## 2024-01-14 ENCOUNTER — Encounter: Payer: Self-pay | Admitting: Internal Medicine

## 2024-01-14 VITALS — BP 120/78 | HR 73 | Temp 97.8°F | Ht 67.0 in | Wt 176.1 lb

## 2024-01-14 DIAGNOSIS — E291 Testicular hypofunction: Secondary | ICD-10-CM | POA: Diagnosis not present

## 2024-01-14 DIAGNOSIS — Z0000 Encounter for general adult medical examination without abnormal findings: Secondary | ICD-10-CM | POA: Diagnosis not present

## 2024-01-14 DIAGNOSIS — E785 Hyperlipidemia, unspecified: Secondary | ICD-10-CM | POA: Diagnosis not present

## 2024-01-14 DIAGNOSIS — E119 Type 2 diabetes mellitus without complications: Secondary | ICD-10-CM

## 2024-01-14 DIAGNOSIS — R7989 Other specified abnormal findings of blood chemistry: Secondary | ICD-10-CM

## 2024-01-14 DIAGNOSIS — I10 Essential (primary) hypertension: Secondary | ICD-10-CM | POA: Diagnosis not present

## 2024-01-14 LAB — CBC WITH DIFFERENTIAL/PLATELET
Basophils Absolute: 0.1 10*3/uL (ref 0.0–0.1)
Basophils Relative: 1 % (ref 0.0–3.0)
Eosinophils Absolute: 0.1 10*3/uL (ref 0.0–0.7)
Eosinophils Relative: 2.1 % (ref 0.0–5.0)
HCT: 51.6 % (ref 39.0–52.0)
Hemoglobin: 17.4 g/dL — ABNORMAL HIGH (ref 13.0–17.0)
Lymphocytes Relative: 25.3 % (ref 12.0–46.0)
Lymphs Abs: 1.6 10*3/uL (ref 0.7–4.0)
MCHC: 33.7 g/dL (ref 30.0–36.0)
MCV: 98.1 fl (ref 78.0–100.0)
Monocytes Absolute: 0.5 10*3/uL (ref 0.1–1.0)
Monocytes Relative: 7.6 % (ref 3.0–12.0)
Neutro Abs: 4 10*3/uL (ref 1.4–7.7)
Neutrophils Relative %: 64 % (ref 43.0–77.0)
Platelets: 231 10*3/uL (ref 150.0–400.0)
RBC: 5.26 Mil/uL (ref 4.22–5.81)
RDW: 13.9 % (ref 11.5–15.5)
WBC: 6.3 10*3/uL (ref 4.0–10.5)

## 2024-01-14 LAB — COMPREHENSIVE METABOLIC PANEL
ALT: 24 U/L (ref 0–53)
AST: 21 U/L (ref 0–37)
Albumin: 4.7 g/dL (ref 3.5–5.2)
Alkaline Phosphatase: 43 U/L (ref 39–117)
BUN: 13 mg/dL (ref 6–23)
CO2: 26 meq/L (ref 19–32)
Calcium: 10.4 mg/dL (ref 8.4–10.5)
Chloride: 108 meq/L (ref 96–112)
Creatinine, Ser: 0.9 mg/dL (ref 0.40–1.50)
GFR: 85.77 mL/min (ref >60.00)
Glucose, Bld: 94 mg/dL (ref 70–99)
Potassium: 4.3 meq/L (ref 3.5–5.1)
Sodium: 143 meq/L (ref 135–145)
Total Bilirubin: 0.4 mg/dL (ref 0.2–1.2)
Total Protein: 7.1 g/dL (ref 6.0–8.3)

## 2024-01-14 LAB — LIPID PANEL
Cholesterol: 122 mg/dL (ref 0–200)
HDL: 61.3 mg/dL (ref >39.00)
LDL Cholesterol: 30 mg/dL (ref 0–99)
NonHDL: 61.14
Total CHOL/HDL Ratio: 2
Triglycerides: 157 mg/dL — ABNORMAL HIGH (ref 0.0–149.0)
VLDL: 31.4 mg/dL (ref 0.0–40.0)

## 2024-01-14 LAB — VITAMIN D 25 HYDROXY (VIT D DEFICIENCY, FRACTURES): VITD: 58.21 ng/mL (ref 30.00–100.00)

## 2024-01-14 LAB — TSH: TSH: 1.53 u[IU]/mL (ref 0.35–5.50)

## 2024-01-14 LAB — HEMOGLOBIN A1C: Hgb A1c MFr Bld: 6.1 % (ref 4.6–6.5)

## 2024-01-14 LAB — MICROALBUMIN / CREATININE URINE RATIO
Creatinine,U: 47.2 mg/dL
Microalb Creat Ratio: 14.8 mg/g (ref 0.0–30.0)
Microalb, Ur: <0.7 mg/dL (ref 0.0–1.9)

## 2024-01-14 LAB — VITAMIN B12: Vitamin B-12: 574 pg/mL (ref 211–911)

## 2024-01-14 LAB — PSA: PSA: 0.5 ng/mL (ref 0.10–4.00)

## 2024-01-14 NOTE — Progress Notes (Signed)
 Established Patient Office Visit     CC/Reason for Visit: Annual preventive exam and subsequent Medicare wellness visit  HPI: [name] [name] is a 73 y.o. male who is coming in today for the above mentioned reasons. Past Medical History is significant for: Coronary artery disease, hypertension, hyperlipidemia, type 2 diabetes, tobacco abuse and testosterone deficiency.  Feeling well without acute concerns or complaints.  Has routine eye and dental care.  Is now due for a Tdap update.   Past Medical/Surgical History: Past Medical History:  Diagnosis Date   Anemia, iron deficiency 11/18/2008   Resolved, despite no rx--2012     ANEMIA-IRON DEFICIENCY    Arthritis    BACK PAIN, LUMBAR    Blood transfusion without reported diagnosis    Cancer (HCC)    skin   Cataract 2023   CORONARY ARTERY DISEASE    DIVERTICULOSIS, COLON    Elevated LFTs 11/18/2015   GERD    Hepatitis    States he was tested for Hep B and he had antibodies   HYPERLIPIDEMIA    HYPERTENSION    dr tom wall   Nephrolithiasis    PONV (postoperative nausea and vomiting)    little nausea after having his hip replacement   Type 2 diabetes mellitus (HCC)    Unspecified disorder of urethra and urinary tract     Past Surgical History:  Procedure Laterality Date   COLONOSCOPY     CORONARY ANGIOPLASTY WITH STENT PLACEMENT     06    dr t. wall   ELECTROCARDIOGRAM  03/30/2007   ENTEROSCOPY N/A 04/30/2022   Procedure: ENTEROSCOPY;  Surgeon: Iva Boop, MD;  Location: [facility];  Service: Gastroenterology;  Laterality: N/A;   ESOPHAGOGASTRODUODENOSCOPY  11/07/2005   JOINT REPLACEMENT  2013   hip   kidney stones     in office procedure to remove but no sedation surgeries for kidney stones    LUMBAR LAMINECTOMY Right 05/18/2014   Procedure: LUMBAR LAMINECTOMY Microdiscectomy Right Lumbar five - sacral one;  Surgeon: Eldred Manges, MD;  Location: [facility] OR;  Service: Orthopedics;  Laterality: Right;   SPINE  SURGERY     lower back   Stress Cardiolite  11/30/2004   SUBMUCOSAL TATTOO INJECTION  04/30/2022   Procedure: SUBMUCOSAL TATTOO INJECTION;  Surgeon: Iva Boop, MD;  Location: [facility];  Service: Gastroenterology;;   TOTAL HIP ARTHROPLASTY  10/28/2012   Procedure: TOTAL HIP ARTHROPLASTY;  Surgeon: Loreta Ave, MD;  Location: [facility] OR;  Service: Orthopedics;  Laterality: Right;   UPPER GASTROINTESTINAL ENDOSCOPY      Social History:  reports that he has been smoking cigarettes. He has a 48 pack-year smoking history. He has never used smokeless tobacco. He reports current alcohol use. He reports that he does not use drugs.  Allergies: Allergies  Allergen Reactions   Actos [Pioglitazone Hydrochloride]     Weight gain    Family History:  Family History  Problem Relation Age of Onset   Colon cancer Mother        Colon Cancer-at advanced age- in her 65's per pt    Cancer Mother    Diabetes Mother    Heart disease Father    Breast cancer Sister    Cancer Sister    Diabetes Paternal Grandfather    Esophageal cancer Neg Hx    Rectal cancer Neg Hx    Stomach cancer Neg Hx      Current Outpatient Medications:  Ascorbic Acid (VITAMIN C) 1000 MG tablet, Take 2,000 mg by mouth daily., Disp: , Rfl:    aspirin EC 81 MG tablet, Take 1 tablet (81 mg total) by mouth daily., Disp: 30 tablet, Rfl: 11   atenolol (TENORMIN) 25 MG tablet, Take 1 tablet (25 mg total) by mouth daily., Disp: 90 tablet, Rfl: 3   b complex vitamins tablet, Take 1 tablet by mouth daily., Disp: , Rfl:    Biotin 5000 MCG TABS, Take 5,000 mcg by mouth daily., Disp: , Rfl:    Cholecalciferol (VITAMIN D) 2000 UNITS tablet, Take 2,000 Units by mouth daily., Disp: , Rfl:    Coenzyme Q10 (CO Q 10) 100 MG CAPS, Take 200 mg by mouth daily. , Disp: , Rfl:    Cyanocobalamin (VITAMIN B-12 PO), Take 1 tablet by mouth daily., Disp: , Rfl:    dapagliflozin propanediol (FARXIGA) 10 MG TABS tablet, TAKE 1 TABLET BY MOUTH  EVERY DAY BEFORE BREAKFAST, Disp: 90 tablet, Rfl: 3   Evolocumab (REPATHA SURECLICK) 140 MG/ML SOAJ, Inject 140 mg into the skin every 14 (fourteen) days., Disp: 6 mL, Rfl: 3   fenofibrate (TRICOR) 145 MG tablet, Take 1 tablet (145 mg total) by mouth daily., Disp: 90 tablet, Rfl: 3   Ginkgo Biloba (GINKOBA PO), Take 1 tablet by mouth daily., Disp: , Rfl:    glucose blood (ONETOUCH VERIO) test strip, USE AS DIRECTED to check blood sugar 1X daily, Disp: 100 strip, Rfl: 3   icosapent Ethyl (VASCEPA) 1 g capsule, Take 2 capsules (2 g total) by mouth 2 (two) times daily., Disp: 360 capsule, Rfl: 3   lisinopril (ZESTRIL) 40 MG tablet, Take 1 tablet (40 mg total) by mouth daily., Disp: 90 tablet, Rfl: 3   Magnesium 400 MG CAPS, Take 400 mg by mouth daily. , Disp: , Rfl:    metFORMIN (GLUCOPHAGE-XR) 500 MG 24 hr tablet, Take 2 tablets (1,000 mg total) by mouth daily., Disp: 180 tablet, Rfl: 3   methocarbamol (ROBAXIN) 500 MG tablet, Take 1 tablet (500 mg total) by mouth every 8 (eight) hours as needed for muscle spasms., Disp: 300 tablet, Rfl: 0   Multiple Vitamin (MULTIVITAMIN) tablet, Take 1 tablet by mouth daily., Disp: , Rfl:    nitroGLYCERIN (NITROSTAT) 0.4 MG SL tablet, Place 1 tablet (0.4 mg total) under the tongue every 5 (five) minutes as needed for chest pain., Disp: 30 tablet, Rfl: 11   pantoprazole (PROTONIX) 40 MG tablet, TAKE 1 TABLET BY MOUTH EVERY DAY, Disp: 90 tablet, Rfl: 3   rosuvastatin (CRESTOR) 5 MG tablet, Take 0.5 tablets (2.5 mg total) by mouth daily., Disp: 45 tablet, Rfl: 3   Semaglutide (RYBELSUS) 14 MG TABS, Take 1 tablet (14 mg total) by mouth daily., Disp: 90 tablet, Rfl: 3   testosterone (ANDROGEL) 50 MG/5GM (1%) GEL, APPLY 50 MG (ONE PACKET) TOPICALLY DAILY., Disp: 150 g, Rfl: 0   Zinc 50 MG TABS, Take 50 mg by mouth daily., Disp: , Rfl:  No current facility-administered medications for this visit.  Facility-Administered Medications Ordered in Other Visits:    regadenoson  (LEXISCAN) injection SOLN 0.4 mg, 0.4 mg, Intravenous, Once, Little Ishikawa, MD  Review of Systems:  Negative unless indicated in HPI.   Physical Exam: Vitals:   01/14/24 1411  BP: 120/78  Pulse: 73  Temp: 97.8 F (36.6 C)  TempSrc: Oral  SpO2: 96%  Weight: 176 lb 1.6 oz (79.9 kg)  Height: 5\' 7"  (1.702 m)    Body mass index  is 27.58 kg/m.   Physical Exam Vitals reviewed.  Constitutional:      General: He is not in acute distress.    Appearance: Normal appearance. He is not ill-appearing, toxic-appearing or diaphoretic.  HENT:     Head: Normocephalic.     Right Ear: Tympanic membrane, ear canal and external ear normal. There is no impacted cerumen.     Left Ear: Tympanic membrane, ear canal and external ear normal. There is no impacted cerumen.     Nose: Nose normal.     Mouth/Throat:     Mouth: Mucous membranes are moist.     Pharynx: Oropharynx is clear. No oropharyngeal exudate or posterior oropharyngeal erythema.  Eyes:     General: No scleral icterus.       Right eye: No discharge.        Left eye: No discharge.     Conjunctiva/sclera: Conjunctivae normal.     Pupils: Pupils are equal, round, and reactive to light.  Neck:     Vascular: No carotid bruit.  Cardiovascular:     Rate and Rhythm: Normal rate and regular rhythm.     Pulses: Normal pulses.     Heart sounds: Normal heart sounds.  Pulmonary:     Effort: Pulmonary effort is normal. No respiratory distress.     Breath sounds: Normal breath sounds.  Abdominal:     General: Abdomen is flat. Bowel sounds are normal.     Palpations: Abdomen is soft.  Musculoskeletal:        General: Normal range of motion.     Cervical back: Normal range of motion.  Skin:    General: Skin is warm and dry.  Neurological:     General: No focal deficit present.     Mental Status: He is alert and oriented to person, place, and time. Mental status is at baseline.  Psychiatric:        Mood and Affect: Mood  normal.        Behavior: Behavior normal.        Thought Content: Thought content normal.        Judgment: Judgment normal.    Subsequent Medicare wellness visit   1. Risk factors, based on past  M,S,F -     2.  Physical activities: Dietary issues and exercise activities discussed:      3.  Depression/mood:  Flowsheet Row Office Visit from 01/13/2023 in [facility] Total Score 0        4.  ADL's:    01/14/2024    2:05 PM  In your present state of health, do you have any difficulty performing the following activities:  Hearing? 0  Vision? 0  Difficulty concentrating or making decisions? 0  Walking or climbing stairs? 0  Dressing or bathing? 0  Doing errands, shopping? 0  Preparing Food and eating ? N  In the past six months, have you accidently leaked urine? N  Do you have problems with loss of bowel control? N  Managing your Medications? N  Managing your Finances? N  Housekeeping or managing your Housekeeping? N     5.  Fall risk:     01/09/2021    1:30 PM 01/10/2022    1:18 PM 04/30/2022    9:39 AM 01/13/2023    1:10 PM 01/14/2024    2:07 PM  Fall Risk  Falls in the past year? 0 0  0 0  Was there an injury with  Fall? 0 0  0 0  Fall Risk Category Calculator 0 0  0 0  Fall Risk Category (Retired) Low Low     (RETIRED) Patient Fall Risk Level   High fall risk    (RETIRED) Patient Fall Risk Level - Comments   sedation today    Fall risk Follow up  Falls evaluation completed  Falls evaluation completed Falls evaluation completed     6.  Home safety: No problems identified   7.  Height weight, and visual acuity: height and weight as above, vision/hearing: Vision Screening   Right eye Left eye Both eyes  Without correction     With correction 20/20 20/20 20/20      8.  Counseling: Ready to quit: No Counseling given: Not Answered Tobacco comments: states he wants to reduce his dependence.    9. Lab orders based on risk  factors: Laboratory update will be reviewed   10. Cognitive assessment:        01/14/2024    2:07 PM 01/13/2023    1:11 PM  6CIT Screen  What Year? 0 points 0 points  What month? 0 points 0 points  What time? 0 points 0 points  Count back from 20 0 points 0 points  Months in reverse 0 points 0 points  Repeat phrase 0 points 0 points  Total Score 0 points 0 points     11. Screening: Patient provided with a written and personalized 5-10 year screening schedule in the AVS. Health Maintenance  Topic Date Due   COVID-19 Vaccine (7 - 2024-25 season) 07/13/2023   Yearly kidney health urinalysis for diabetes  01/14/2024   Screening for Lung Cancer  01/30/2024   Yearly kidney function blood test for diabetes  02/27/2024   Complete foot exam   04/07/2024   Hemoglobin A1C  04/12/2024   DTaP/Tdap/Td vaccine (3 - Td or Tdap) 10/14/2024   Eye exam for diabetics  12/10/2024   Medicare Annual Wellness Visit  01/13/2025   Pneumonia Vaccine  Completed   Flu Shot  Completed   Hepatitis C Screening  Completed   Zoster (Shingles) Vaccine  Completed   HPV Vaccine  Aged Out   Colon Cancer Screening  Discontinued    12. Provider List Update: Patient Care Team    Relationship Specialty Notifications Start End  Philip Aspen, Limmie Patricia, MD PCP - General Internal Medicine  11/20/18   Christell Constant, MD PCP - Cardiology Cardiology  10/02/23   Daleen Squibb, Jesse Sans, MD  Cardiology  06/10/12   Bevelyn Ngo, NP Nurse Practitioner Pulmonary Disease  12/03/18   Lars Masson, MD Consulting Physician Cardiology  12/03/18   Verner Chol, [facility] (Inactive) Pharmacist Pharmacist  10/19/20    Comment: Phone: [phone]  Fredrich Birks, OD Referring Physician Optometry  01/10/22      13. Advance Directives: Does Patient Have a Medical Advance Directive?: Yes Type of Advance Directive: Healthcare Power of Attorney, Living will Does patient want to make changes to medical advance directive?: No -  Patient declined Copy of Healthcare Power of Attorney in Chart?: No - copy requested  14. Opioids: Patient is not on any opioid prescriptions and has no risk factors for a substance use disorder.   15.   Goals      Patient Stated     Reduce cigarette use for transition to senior apartments.       Stay Active and Independent-Low Back Pain     Timeframe:  Long-Range Goal  Priority:  High Start Date:                             Expected End Date:                          - choose a type of activity I enjoy    Why is this important?   Regular activity or exercise is important to managing back pain.  Activity helps to keep your muscles strong.  You will sleep better and feel more relaxed.  You will have more energy and feel less stressed.  If you are not active now, start slowly. Little changes make a big difference.  Rest, but not too much.  Stay as active as you can and listen to your body's signals.     Notes:      Weight (lb) < 190 lb (86.2 kg)         I have personally reviewed and noted the following in the patient's chart:   Medical and social history Use of alcohol, tobacco or illicit drugs  Current medications and supplements Functional ability and status Nutritional status Physical activity Advanced directives List of other physicians Hospitalizations, surgeries, and ER visits in previous 12 months Vitals Screenings to include cognitive, depression, and falls Referrals and appointments  In addition, I have reviewed and discussed with patient certain preventive protocols, quality metrics, and best practice recommendations. A written personalized care plan for preventive services as well as general preventive health recommendations were provided to patient.   Impression and Plan:  Medicare annual wellness visit, subsequent -     PSA; Future  Controlled type 2 diabetes mellitus without complication, without long-term current use of insulin (HCC) -      Microalbumin / creatinine urine ratio; Future -     Hemoglobin A1c; Future  Dyslipidemia -     Lipid panel; Future  Essential hypertension -     CBC with Differential/Platelet; Future -     Comprehensive metabolic panel; Future  Testosterone deficiency in male  Hypogonadism male -     TSH; Future -     Vitamin B12; Future -     VITAMIN D 25 Hydroxy (Vit-D Deficiency, Fractures); Future  Elevated LFTs  Low testosterone   -Recommend routine eye and dental care. -Healthy lifestyle discussed in detail. -Labs to be updated today. -Prostate cancer screening: PSA today Health Maintenance  Topic Date Due   COVID-19 Vaccine (7 - 2024-25 season) 07/13/2023   Yearly kidney health urinalysis for diabetes  01/14/2024   Screening for Lung Cancer  01/30/2024   Yearly kidney function blood test for diabetes  02/27/2024   Complete foot exam   04/07/2024   Hemoglobin A1C  04/12/2024   DTaP/Tdap/Td vaccine (3 - Td or Tdap) 10/14/2024   Eye exam for diabetics  12/10/2024   Medicare Annual Wellness Visit  01/13/2025   Pneumonia Vaccine  Completed   Flu Shot  Completed   Hepatitis C Screening  Completed   Zoster (Shingles) Vaccine  Completed   HPV Vaccine  Aged Out   Colon Cancer Screening  Discontinued     -Advised to update Tdap at pharmacy.     Chaya Jan, MD [facility]

## 2024-01-15 ENCOUNTER — Encounter: Payer: Self-pay | Admitting: Internal Medicine

## 2024-02-02 ENCOUNTER — Ambulatory Visit
Admission: RE | Admit: 2024-02-02 | Discharge: 2024-02-02 | Disposition: A | Discharge status: Home / Self Care | Payer: 59 | Source: Ambulatory Visit | Attending: Acute Care | Admitting: Acute Care

## 2024-02-02 DIAGNOSIS — F17210 Nicotine dependence, cigarettes, uncomplicated: Secondary | ICD-10-CM

## 2024-02-02 DIAGNOSIS — Z122 Encounter for screening for malignant neoplasm of respiratory organs: Secondary | ICD-10-CM

## 2024-02-02 DIAGNOSIS — Z87891 Personal history of nicotine dependence: Secondary | ICD-10-CM

## 2024-02-18 ENCOUNTER — Telehealth: Payer: Self-pay

## 2024-02-18 NOTE — Telephone Encounter (Signed)
 Copied from CRM [id number]. Topic: Clinical - Lab/Test Results >> Feb 18, 2024 11:38 AM Orinda Kenner C wrote: Reason for CRM: Patient [phone] would like CT chest lung cancer screening results, please call back.  Spoke with patient regarding prior message.Advised patient CT scans are taking a little longer 3-4 weeks for the radiologist to read the scans.Patient stated he will keep checking my chart. Patient's voice was understanding.Nothing else further needed.

## 2024-02-18 NOTE — Telephone Encounter (Unsigned)
 Copied from CRM [id number]. Topic: Clinical - Lab/Test Results >> Feb 18, 2024 11:38 AM Orinda Kenner C wrote: Reason for CRM: Patient [phone] would like CT chest lung cancer screening results, please call back.

## 2024-03-09 ENCOUNTER — Encounter: Payer: Self-pay | Admitting: Internal Medicine

## 2024-03-09 ENCOUNTER — Other Ambulatory Visit: Payer: Self-pay | Admitting: Internal Medicine

## 2024-03-09 ENCOUNTER — Other Ambulatory Visit: Payer: Self-pay | Admitting: Acute Care

## 2024-03-09 ENCOUNTER — Telehealth: Payer: Self-pay | Admitting: *Deleted

## 2024-03-09 DIAGNOSIS — Z87891 Personal history of nicotine dependence: Secondary | ICD-10-CM

## 2024-03-09 DIAGNOSIS — Z122 Encounter for screening for malignant neoplasm of respiratory organs: Secondary | ICD-10-CM

## 2024-03-09 DIAGNOSIS — I714 Abdominal aortic aneurysm, without rupture, unspecified: Secondary | ICD-10-CM | POA: Insufficient documentation

## 2024-03-09 DIAGNOSIS — F17210 Nicotine dependence, cigarettes, uncomplicated: Secondary | ICD-10-CM

## 2024-03-09 NOTE — Telephone Encounter (Signed)
-----   Message from Marguerita Shih sent at 03/09/2024  9:34 AM EDT ----- Please let him know I will refer him to vascular surgery due to an aortic aneurysm seen on CT for lung cancer screening. ----- Message ----- From: Michela Aguas, RN Sent: 03/09/2024   9:12 AM EDT To: Raeanne Bull, MD  Please see notation of aneurysm

## 2024-03-09 NOTE — Telephone Encounter (Signed)
 Patient is aware

## 2024-03-15 ENCOUNTER — Other Ambulatory Visit: Payer: Self-pay | Admitting: Internal Medicine

## 2024-03-18 ENCOUNTER — Telehealth: Payer: Self-pay | Admitting: *Deleted

## 2024-03-18 ENCOUNTER — Other Ambulatory Visit: Payer: Self-pay | Admitting: Internal Medicine

## 2024-03-18 ENCOUNTER — Encounter: Payer: Self-pay | Admitting: Internal Medicine

## 2024-03-18 DIAGNOSIS — I7121 Aneurysm of the ascending aorta, without rupture: Secondary | ICD-10-CM

## 2024-03-18 NOTE — Telephone Encounter (Signed)
 Patient is aware

## 2024-03-18 NOTE — Telephone Encounter (Signed)
 Copied from CRM [id number]. Topic: Referral - Question >> Mar 18, 2024  4:13 PM Adonis Hoot wrote: Reason for CRM: Patient called stating requesting a phone call to discuss the difference between a vascular surgeon and thoracic surgeon.He received a call form one place and is wondering should he call them back to schedule.

## 2024-03-25 ENCOUNTER — Other Ambulatory Visit: Payer: Self-pay

## 2024-03-25 DIAGNOSIS — E785 Hyperlipidemia, unspecified: Secondary | ICD-10-CM

## 2024-03-25 MED ORDER — ROSUVASTATIN CALCIUM 5 MG PO TABS: 2.5000 mg | ORAL_TABLET | Freq: Every day | ORAL | 1 refills | Status: DC

## 2024-03-26 ENCOUNTER — Other Ambulatory Visit: Payer: Self-pay | Admitting: Internal Medicine

## 2024-03-26 NOTE — Telephone Encounter (Signed)
 Requested Prescriptions   Pending Prescriptions Disp Refills   RYBELSUS  14 MG TABS [Pharmacy Med Name: RYBELSUS  14 MG TABLET] 90 tablet 3    Sig: TAKE 1 TABLET (14 MG TOTAL) BY MOUTH DAILY

## 2024-03-31 ENCOUNTER — Other Ambulatory Visit: Payer: Self-pay | Admitting: Internal Medicine

## 2024-03-31 DIAGNOSIS — R7989 Other specified abnormal findings of blood chemistry: Secondary | ICD-10-CM

## 2024-04-13 DIAGNOSIS — Z08 Encounter for follow-up examination after completed treatment for malignant neoplasm: Secondary | ICD-10-CM | POA: Diagnosis not present

## 2024-04-13 DIAGNOSIS — Z85828 Personal history of other malignant neoplasm of skin: Secondary | ICD-10-CM | POA: Diagnosis not present

## 2024-04-15 ENCOUNTER — Ambulatory Visit: Payer: 59 | Admitting: Internal Medicine

## 2024-04-28 ENCOUNTER — Encounter

## 2024-04-28 ENCOUNTER — Ambulatory Visit: Attending: Surgery | Admitting: Surgical

## 2024-04-28 VITALS — BP 137/87 | HR 72 | Resp 18 | Ht 67.0 in

## 2024-04-28 DIAGNOSIS — I712 Thoracic aortic aneurysm, without rupture, unspecified: Secondary | ICD-10-CM | POA: Diagnosis not present

## 2024-04-28 NOTE — Patient Instructions (Signed)
 Good control of blood pressure Good control of chronic conditions with medications and lifestyle changes as able

## 2024-04-28 NOTE — Progress Notes (Signed)
 [address]       [place] [address]             [phone]        WILMORE HOLSOMBACK [id number] [birth year]  History of Present Illness: The patient is a 73 year old male seen in referral due to a finding on recent low-dose chest CT scan done 03/08/2024 for lung cancer screening of AAA 4.0 cm ascending thoracic aortic aneurysm.  The patient is a long-term smoker of greater than 40 years.  He has other significant cardiac risk factors including hypertension, hyperlipidemia and type 2 diabetes mellitus.  He has had difficulties with statins due to side effects he is currently on Repatha  and low-dose Crestor .  He describes his blood pressure is being under good control.  He does have rare chest pain which he describes as a brief twinge that is not related to exertion.  He denies shortness of breath or DOE but does have a occasional dry cough.  He does not get palpitations or lower extremity edema.  He is followed by cardiology.   Past Medical History:  Diagnosis Date   Anemia, iron deficiency 11/18/2008   Resolved, despite no rx--2012     ANEMIA-IRON DEFICIENCY    Arthritis    BACK PAIN, LUMBAR    Blood transfusion without reported diagnosis    Cancer (HCC)    skin   Cataract 2023   CORONARY ARTERY DISEASE    DIVERTICULOSIS, COLON    Elevated LFTs 11/18/2015   GERD    Hepatitis    States he was tested for Hep B and he had antibodies   HYPERLIPIDEMIA    HYPERTENSION    dr tom wall   Nephrolithiasis    PONV (postoperative nausea and vomiting)    little nausea after having his hip replacement   Type 2 diabetes mellitus (HCC)    Unspecified disorder of urethra and urinary tract      Past Surgical History:  Procedure Laterality Date   COLONOSCOPY     CORONARY ANGIOPLASTY WITH STENT PLACEMENT     06    dr t. wall   ELECTROCARDIOGRAM  03/30/2007   ENTEROSCOPY N/A 04/30/2022   Procedure: ENTEROSCOPY;  Surgeon: Kenney Peacemaker, MD;  Location: [facility];   Service: Gastroenterology;  Laterality: N/A;   ESOPHAGOGASTRODUODENOSCOPY  11/07/2005   JOINT REPLACEMENT  2013   hip   kidney stones     in office procedure to remove but no sedation surgeries for kidney stones    LUMBAR LAMINECTOMY Right 05/18/2014   Procedure: LUMBAR LAMINECTOMY Microdiscectomy Right Lumbar five - sacral one;  Surgeon: Adah Acron, MD;  Location: [facility] OR;  Service: Orthopedics;  Laterality: Right;   SPINE SURGERY     lower back   Stress Cardiolite   11/30/2004   SUBMUCOSAL TATTOO INJECTION  04/30/2022   Procedure: SUBMUCOSAL TATTOO INJECTION;  Surgeon: Kenney Peacemaker, MD;  Location: [facility];  Service: Gastroenterology;;   TOTAL HIP ARTHROPLASTY  10/28/2012   Procedure: TOTAL HIP ARTHROPLASTY;  Surgeon: Ferd Householder, MD;  Location: [facility] OR;  Service: Orthopedics;  Laterality: Right;   UPPER GASTROINTESTINAL ENDOSCOPY      Current Outpatient Medications  Medication Instructions   aspirin  EC 81 mg, Oral, Daily   atenolol  (TENORMIN ) 25 mg, Oral, Daily   b complex vitamins tablet 1 tablet, Daily   Biotin 5,000 mcg, Daily   Co Q 10 200 mg, Daily   Cyanocobalamin  (VITAMIN B-12  PO) 1 tablet, Daily   dapagliflozin  propanediol (FARXIGA ) 10 MG TABS tablet TAKE 1 TABLET BY MOUTH EVERY DAY BEFORE BREAKFAST   fenofibrate  (TRICOR ) 145 mg, Oral, Daily   Ginkgo Biloba (GINKOBA PO) 1 tablet, Daily   glucose blood (ONETOUCH VERIO) test strip USE AS DIRECTED to check blood sugar 1X daily   icosapent  Ethyl (VASCEPA ) 2 g, Oral, 2 times daily   lisinopril  (ZESTRIL ) 40 mg, Oral, Daily   Magnesium  400 mg, Daily   metFORMIN  (GLUCOPHAGE -XR) 1,000 mg, Oral, Daily   methocarbamol  (ROBAXIN ) 500 mg, Oral, Every 8 hours PRN   Multiple Vitamin (MULTIVITAMIN) tablet 1 tablet, Daily   nitroGLYCERIN  (NITROSTAT ) 0.4 mg, Sublingual, Every 5 min PRN   pantoprazole  (PROTONIX ) 40 MG tablet TAKE 1 TABLET BY MOUTH EVERY DAY   Repatha  SureClick 140 mg, Subcutaneous, Every 14 days   rosuvastatin   (CRESTOR ) 2.5 mg, Oral, Daily   Rybelsus  14 mg, Oral, Daily   testosterone  (ANDROGEL ) 50 MG/5GM (1%) GEL APPLY 50 MG (ONE PACKET) TOPICALLY DAILY.   vitamin C 2,000 mg, Daily   Vitamin D  2,000 Units, Daily   Zinc 50 mg, Daily    Allergies  Allergen Reactions   Actos [Pioglitazone Hydrochloride]     Weight gain      Current Outpatient Medications on File Prior to Visit  Medication Sig Dispense Refill   Ascorbic Acid (VITAMIN C) 1000 MG tablet Take 2,000 mg by mouth daily.     aspirin  EC 81 MG tablet Take 1 tablet (81 mg total) by mouth daily. 30 tablet 11   atenolol  (TENORMIN ) 25 MG tablet Take 1 tablet (25 mg total) by mouth daily. 90 tablet 3   b complex vitamins tablet Take 1 tablet by mouth daily.     Biotin 5000 MCG TABS Take 5,000 mcg by mouth daily.     Cholecalciferol (VITAMIN D ) 2000 UNITS tablet Take 2,000 Units by mouth daily.     Coenzyme Q10 (CO Q 10) 100 MG CAPS Take 200 mg by mouth daily.      Cyanocobalamin  (VITAMIN B-12 PO) Take 1 tablet by mouth daily.     dapagliflozin  propanediol (FARXIGA ) 10 MG TABS tablet TAKE 1 TABLET BY MOUTH EVERY DAY BEFORE BREAKFAST 90 tablet 3   Evolocumab  (REPATHA  SURECLICK) 140 MG/ML SOAJ Inject 140 mg into the skin every 14 (fourteen) days. 6 mL 3   fenofibrate  (TRICOR ) 145 MG tablet Take 1 tablet (145 mg total) by mouth daily. 90 tablet 3   Ginkgo Biloba (GINKOBA PO) Take 1 tablet by mouth daily.     glucose blood (ONETOUCH VERIO) test strip USE AS DIRECTED to check blood sugar 1X daily 100 strip 3   icosapent  Ethyl (VASCEPA ) 1 g capsule Take 2 capsules (2 g total) by mouth 2 (two) times daily. 360 capsule 3   lisinopril  (ZESTRIL ) 40 MG tablet Take 1 tablet (40 mg total) by mouth daily. 90 tablet 3   Magnesium  400 MG CAPS Take 400 mg by mouth daily.      metFORMIN  (GLUCOPHAGE -XR) 500 MG 24 hr tablet TAKE 2 TABLETS BY MOUTH EVERY DAY 180 tablet 3   methocarbamol  (ROBAXIN ) 500 MG tablet Take 1 tablet (500 mg total) by mouth every 8  (eight) hours as needed for muscle spasms. 300 tablet 0   Multiple Vitamin (MULTIVITAMIN) tablet Take 1 tablet by mouth daily.     nitroGLYCERIN  (NITROSTAT ) 0.4 MG SL tablet Place 1 tablet (0.4 mg total) under the tongue every 5 (five) minutes as needed for  chest pain. 30 tablet 11   pantoprazole  (PROTONIX ) 40 MG tablet TAKE 1 TABLET BY MOUTH EVERY DAY 90 tablet 3   rosuvastatin  (CRESTOR ) 5 MG tablet Take 0.5 tablets (2.5 mg total) by mouth daily. 45 tablet 1   RYBELSUS  14 MG TABS TAKE 1 TABLET (14 MG TOTAL) BY MOUTH DAILY 90 tablet 3   testosterone  (ANDROGEL ) 50 MG/5GM (1%) GEL APPLY 50 MG (ONE PACKET) TOPICALLY DAILY. 150 g 0   Zinc 50 MG TABS Take 50 mg by mouth daily.     Current Facility-Administered Medications on File Prior to Visit  Medication Dose Route Frequency Provider Last Rate Last Admin   regadenoson  (LEXISCAN ) injection SOLN 0.4 mg  0.4 mg Intravenous Once Wendie Hamburg, MD       Social History   Occupational History   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  Vaping Use   Vaping status: Never Used  Substance and Sexual Activity   Alcohol use: Yes    Comment: rare   Drug use: No   Sexual activity: Not Currently      Ht 5' 7 (1.702 m)   BMI 27.58 kg/m   Physical Exam Constitutional:      Appearance: Normal appearance. He is normal weight. He is not ill-appearing.  HENT:     Head: Normocephalic and atraumatic.     Mouth/Throat:     Mouth: Mucous membranes are moist.     Pharynx: Oropharynx is clear. No oropharyngeal exudate.     Comments: Full dentures  Eyes:     General: No scleral icterus.    Extraocular Movements: Extraocular  movements intact.     Pupils: Pupils are equal, round, and reactive to light.   Neck:     Vascular: No carotid bruit.   Cardiovascular:     Rate and Rhythm: Normal rate and regular rhythm.     Heart sounds: No murmur heard. Pulmonary:     Breath sounds: No wheezing, rhonchi or rales.     Comments: Coarse breath sounds throughout Abdominal:     Palpations: Abdomen is soft.     Tenderness: There is no abdominal tenderness.   Musculoskeletal:     Right lower leg: No edema.     Left lower leg: No edema.  Lymphadenopathy:     Cervical: No cervical adenopathy.   Skin:    Capillary Refill: Capillary refill takes less than 2 seconds.     Coloration: Skin is not jaundiced or pale.   Neurological:     General: No focal deficit present.     Mental Status: He is alert.   Psychiatric:        Thought Content: Thought content normal.     CTA Results:  Narrative & Impression  CLINICAL DATA:  Fifty-five pack-year smoker.   EXAM: CT CHEST WITHOUT CONTRAST LOW-DOSE FOR LUNG CANCER SCREENING   TECHNIQUE: Multidetector CT imaging of the chest was performed following the standard protocol without IV contrast.   RADIATION DOSE REDUCTION: This exam was performed according to the departmental dose-optimization program which includes automated exposure control, adjustment of the mA and/or kV  according to patient size and/or use of iterative reconstruction technique.   COMPARISON:  01/30/2023.   FINDINGS: Cardiovascular: Atherosclerotic calcification of the aorta, aortic valve and coronary arteries. Ascending aorta measures 4.0 cm (coronal image 184), unchanged. Heart size normal. No pericardial effusion.   Mediastinum/Nodes: No pathologically enlarged mediastinal or axillary lymph nodes. Hilar regions are difficult to definitively evaluate without IV contrast. Esophagus is grossly unremarkable.   Lungs/Pleura: Centrilobular and paraseptal emphysema. Pulmonary nodules measure  3.9 mm or less in size, as before. No new pulmonary nodules. No pleural fluid. Debris in the airway.   Upper Abdomen: 2.0 cm right adrenal nodule measures -5 Hounsfield units. No specific follow-up necessary. Slight gastric wall thickening. Visualized portions of the liver, adrenal glands, right kidney, spleen, pancreas, stomach and bowel are otherwise grossly unremarkable. No upper abdominal adenopathy.   Musculoskeletal: Degenerative changes in the spine.   IMPRESSION: 1. Lung-RADS 2, benign appearance or behavior. Continue annual screening with low-dose chest CT without contrast in 12 months. 2. 4.0 cm ascending aortic aneurysm, stable. Recommend annual imaging followup by CTA or MRA. This recommendation follows 2010 ACCF/AHA/AATS/ACR/ASA/SCA/SCAI/SIR/STS/SVM Guidelines for the Diagnosis and Management of Patients with Thoracic Aortic Disease. Circulation. 2010; 121: W098-J191. Aortic aneurysm NOS (ICD10-I71.9). 3. Right adrenal adenoma. 4. Gastric wall thickening. 5. Aortic atherosclerosis (ICD10-I70.0). Coronary artery calcification. 6.  Emphysema (ICD10-J43.9).     Electronically Signed   By: Shearon Denis M.D.   On: 03/08/2024 14:41         ECHOCARDIOGRAM REPORT       Patient Name:   MALCOLM QUAST Date of Exam: 09/22/2023  Medical Rec #:  [id number]       Height:       67.0 in  Accession #:    [id number]      Weight:       197.2 lb  Date of Birth:  01/16/1951       BSA:          2.010 m  Patient Age:    72 years        BP:           134/87 mmHg  Patient Gender: M               HR:           79 bpm.  Exam Location:  [address]   Procedure: 2D Echo, 3D Echo, Cardiac Doppler and Color Doppler   Indications:    I77.819 Aortic Dilation    History:        Patient has prior history of Echocardiogram examinations,  most                 recent 10/08/2022. CAD; Risk Factors:Current Smoker,                  Hypertension, Diabetes, Dyslipidemia and Family History  of                  Coronary Artery Disease.    Sonographer:    Ewing Holiday RDCS  Referring Phys: HEATHER E PEMBERTON   IMPRESSIONS     1. Left ventricular ejection fraction, by estimation, is 55 to 60%. The  left ventricle has normal function. The left ventricle has no regional  wall motion abnormalities. Left ventricular diastolic parameters are  consistent with Grade I diastolic  dysfunction (impaired relaxation).   2. Right ventricular systolic function is normal. The right ventricular  size is normal. There is normal  pulmonary artery systolic pressure.   3. The mitral valve is normal in structure. No evidence of mitral valve  regurgitation. No evidence of mitral stenosis.   4. The aortic valve is tricuspid. Aortic valve regurgitation is not  visualized. No aortic stenosis is present.   5. Aortic dilatation noted. There is mild dilatation of the ascending  aorta, measuring 40 mm. There is mild dilatation of the aortic root,  measuring 42 mm.   6. The inferior vena cava is normal in size with greater than 50%  respiratory variability, suggesting right atrial pressure of 3 mmHg.   FINDINGS   Left Ventricle: Left ventricular ejection fraction, by estimation, is 55  to 60%. The left ventricle has normal function. The left ventricle has no  regional wall motion abnormalities. The left ventricular internal cavity  size was normal in size. There is   no left ventricular hypertrophy. Left ventricular diastolic parameters  are consistent with Grade I diastolic dysfunction (impaired relaxation).  Indeterminate filling pressures.   Right Ventricle: The right ventricular size is normal. No increase in  right ventricular wall thickness. Right ventricular systolic function is  normal. There is normal pulmonary artery systolic pressure. The tricuspid  regurgitant velocity is 1.75 m/s, and   with an assumed right atrial pressure of 3 mmHg, the estimated right  ventricular systolic  pressure is 15.2 mmHg.   Left Atrium: Left atrial size was normal in size.   Right Atrium: Right atrial size was normal in size.   Pericardium: There is no evidence of pericardial effusion.   Mitral Valve: The mitral valve is normal in structure. No evidence of  mitral valve regurgitation. No evidence of mitral valve stenosis.   Tricuspid Valve: The tricuspid valve is normal in structure. Tricuspid  valve regurgitation is trivial. No evidence of tricuspid stenosis.   Aortic Valve: The aortic valve is tricuspid. Aortic valve regurgitation is  not visualized. No aortic stenosis is present.   Pulmonic Valve: The pulmonic valve was normal in structure. Pulmonic valve  regurgitation is not visualized. No evidence of pulmonic stenosis.   Aorta: Aortic dilatation noted. There is mild dilatation of the ascending  aorta, measuring 40 mm. There is mild dilatation of the aortic root,  measuring 42 mm.   Venous: The inferior vena cava is normal in size with greater than 50%  respiratory variability, suggesting right atrial pressure of 3 mmHg.   IAS/Shunts: No atrial level shunt detected by color flow Doppler.     LEFT VENTRICLE  PLAX 2D  LVIDd:         5.10 cm   Diastology  LVIDs:         2.70 cm   LV e' medial:    7.72 cm/s  LV PW:         0.90 cm   LV E/e' medial:  10.3  LV IVS:        1.00 cm   LV e' lateral:   7.46 cm/s  LVOT diam:     2.60 cm   LV E/e' lateral: 10.7  LVOT Area:     5.31 cm                             3D Volume EF:                           3D EF:  53 %                           LV EDV:       123 ml                           LV ESV:       57 ml                           LV SV:        66 ml   RIGHT VENTRICLE  RV Basal diam:  3.95 cm  RV S prime:     12.90 cm/s  RVSP:           15.2 mmHg   LEFT ATRIUM             Index        RIGHT ATRIUM           Index  LA diam:        3.60 cm 1.79 cm/m   RA Pressure: 3.00 mmHg  LA Vol (A2C):   50.5 ml 25.12 ml/m   RA Area:     17.60 cm  LA Vol (A4C):   53.6 ml 26.66 ml/m  RA Volume:   48.20 ml  23.98 ml/m  LA Biplane Vol: 57.1 ml 28.41 ml/m     AORTA  Ao Root diam: 4.20 cm  Ao Asc diam:  4.15 cm   MITRAL VALVE                TRICUSPID VALVE  MV Area (PHT): cm          TR Peak grad:   12.2 mmHg  MV Decel Time: 211 msec     TR Vmax:        175.00 cm/s  MV E velocity: 79.70 cm/s   Estimated RAP:  3.00 mmHg  MV A velocity: 109.00 cm/s  RVSP:           15.2 mmHg  MV E/A ratio:  0.73                              SHUNTS                              Systemic Diam: 2.60 cm   Maudine Sos MD  Electronically signed by Maudine Sos MD  Signature Date/Time: 09/22/2023/4:02:53 PM       A/P: 4.0 cm ascending thoracic aneurysm, asymptomatic.  We will obtain a repeat chest CTA in 1 year for ongoing surveillance.    Risk Modification: We discussed the importance of lifestyle medicine modifications as well as good control of chronic conditions.  Statin: On Repatha  and Crestor   Smoking cessation instruction/counseling given:  counseled patient on the dangers of tobacco use, advised patient to stop smoking, and reviewed strategies to maximize success Patient is not interested in tobacco cessation at this time  Patient was counseled on importance of Blood Pressure Control.  Despite Medical intervention if the patient notices persistently elevated blood pressure readings.  They are instructed to contact their Primary Care Physician  Please avoid use of Fluoroquinolones as this can potentially increase your risk of Aortic Rupture and/or Dissection  Patient educated on signs and symptoms of Aortic  Dissection, handout also provided in AVS  Lindi Revering, PA-C 04/28/24

## 2024-06-15 ENCOUNTER — Encounter: Payer: Self-pay | Admitting: Internal Medicine

## 2024-06-15 ENCOUNTER — Ambulatory Visit (INDEPENDENT_AMBULATORY_CARE_PROVIDER_SITE_OTHER): Admitting: Internal Medicine

## 2024-06-15 VITALS — BP 130/70 | HR 73 | Ht 67.0 in | Wt 165.4 lb

## 2024-06-15 DIAGNOSIS — E1165 Type 2 diabetes mellitus with hyperglycemia: Secondary | ICD-10-CM

## 2024-06-15 DIAGNOSIS — E1169 Type 2 diabetes mellitus with other specified complication: Secondary | ICD-10-CM

## 2024-06-15 DIAGNOSIS — E785 Hyperlipidemia, unspecified: Secondary | ICD-10-CM

## 2024-06-15 DIAGNOSIS — E1159 Type 2 diabetes mellitus with other circulatory complications: Secondary | ICD-10-CM

## 2024-06-15 DIAGNOSIS — Z7984 Long term (current) use of oral hypoglycemic drugs: Secondary | ICD-10-CM | POA: Diagnosis not present

## 2024-06-15 LAB — POCT GLYCOSYLATED HEMOGLOBIN (HGB A1C): Hemoglobin A1C: 5.6 % (ref 4.0–5.6)

## 2024-06-15 MED ORDER — ACCU-CHEK SOFTCLIX LANCETS MISC
3 refills | Status: AC
Start: 2024-06-15 — End: ?

## 2024-06-15 MED ORDER — GLUCOSE BLOOD VI STRP: ORAL_STRIP | 3 refills | Status: AC

## 2024-06-15 MED ORDER — METFORMIN HCL ER 500 MG PO TB24: 500.0000 mg | ORAL_TABLET | Freq: Every day | ORAL | Status: DC

## 2024-06-15 MED ORDER — ACCU-CHEK GUIDE W/DEVICE KIT: PACK | 0 refills | Status: AC

## 2024-06-15 NOTE — Addendum Note (Signed)
 Addended by: CLEOTILDE ROLIN RAMAN on: 06/15/2024 03:34 PM   Modules accepted: Orders

## 2024-06-15 NOTE — Patient Instructions (Addendum)
 Please decrease: - Metformin  ER 500 mg 1x a day, with meals  Continue: - Farxiga  10 mg before breakfast - Rybelsus  14 mg before breakfast  Please return in 4-6 months with your meter.

## 2024-06-15 NOTE — Progress Notes (Signed)
 Patient ID: [name] [name], male   DOB: 10-Mar-1951, 73 y.o.   MRN: [id number]  HPI: [name] [name] is a 73 y.o.-year-old male, returning for follow-up for DM2, dx in 2008, non-insulin -dependent, uncontrolled, with circulatory complications (CAD). Pt. previously saw Dr. Kassie, but last visit with me 8 months ago.  Interim history: He has increased urination, which she attributes to Farxiga , but no blurry vision, nausea, chest pain. He lost 12 pounds before last visit due to decreased appetite.  He was very happy with his weight loss. He lost 12 more pounds since last visit.  He also mentions that he made solitary changes in his diet like eliminating chips and other snacks. He does have back pain: spondylosis with radiculopathy.  Reviewed HbA1c: Lab Results  Component Value Date   HGBA1C 6.1 01/14/2024   HGBA1C 5.9 (A) 10/13/2023   HGBA1C 6.2 (A) 04/08/2023   HGBA1C 6.2 01/14/2023   HGBA1C 5.9 (A) 10/11/2022   HGBA1C 5.8 (A) 04/11/2022   HGBA1C 7.1 (H) 01/10/2022   HGBA1C 6.8 (A) 09/26/2021   HGBA1C 6.8 (A) 03/22/2021   HGBA1C 7.1 (H) 01/09/2021   Pt is on a regimen of: - Metformin  ER 1000 mg with dinner - Farxiga  10 mg before breakfast - Rybelsus  14 mg before breakfast He tried Actos but this caused weight gain.  Pt was checking his sugars 0 to once a day: - am: 144 >> n/c >> ?  - 2h after b'fast: n/c - before lunch: n/c >> 143 >> n/c - 2h after lunch: n/c >> 120 >> n/c >> 96 - before dinner:67-102 >> 84-104  >> 80s-125>> 84-118 - 2h after dinner: n/c >> 99 >> n/c - bedtime: n/c - nighttime: n/c Lowest sugar was 91 >> 67 >> 84 >> 80s >> 80s; he has hypoglycemia awareness at 70.  Highest sugar was 144 >> 143 >> 104 >> 200s (steroids) >> 123. He stays up until 2-3 am.   Glucometer: One Touch Verio  - no CKD, last BUN/creatinine:  Lab Results  Component Value Date   BUN 13 01/14/2024   BUN 17 02/27/2023   CREATININE 0.90 01/14/2024   CREATININE 0.87 02/27/2023   Lab  Results  Component Value Date   MICRALBCREAT 14.8 01/14/2024   MICRALBCREAT 5.3 08/28/2012  On lisinopril  40 mg daily.  - + HL; last set of lipids: Lab Results  Component Value Date   CHOL 122 01/14/2024   HDL 61.30 01/14/2024   LDLCALC 30 01/14/2024   LDLDIRECT 26.0 01/14/2023   TRIG 157.0 (H) 01/14/2024   CHOLHDL 2 01/14/2024  On Repatha , Vascepa  2 g twice a day, Crestor  2.5 mg daily, fenofibrate  145 mg daily, and co-Q10.  - last eye exam was in 12/11/2023: No DR. Dr. Glendia. He had cataract sx. 11/2022.  - no numbness and tingling in his feet.  Last foot exam 04/08/2023.  He is on ASA 81.  He does have a history of NASH-on vitamin E, anemia, HTN, hypogonadism-on testosterone  injections, history of nephrolithiasis, diverticulosis.  ROS: + see HPI  Past Medical History:  Diagnosis Date   Anemia, iron deficiency 11/18/2008   Resolved, despite no rx--2012     ANEMIA-IRON DEFICIENCY    Arthritis    BACK PAIN, LUMBAR    Blood transfusion without reported diagnosis    Cancer (HCC)    skin   Cataract 2023   CORONARY ARTERY DISEASE    DIVERTICULOSIS, COLON    Elevated LFTs 11/18/2015   GERD  Hepatitis    States he was tested for Hep B and he had antibodies   HYPERLIPIDEMIA    HYPERTENSION    dr tom wall   Nephrolithiasis    PONV (postoperative nausea and vomiting)    little nausea after having his hip replacement   Type 2 diabetes mellitus (HCC)    Unspecified disorder of urethra and urinary tract    Past Surgical History:  Procedure Laterality Date   COLONOSCOPY     CORONARY ANGIOPLASTY WITH STENT PLACEMENT     06    dr t. wall   ELECTROCARDIOGRAM  03/30/2007   ENTEROSCOPY N/A 04/30/2022   Procedure: ENTEROSCOPY;  Surgeon: Avram Lupita BRAVO, MD;  Location: [facility];  Service: Gastroenterology;  Laterality: N/A;   ESOPHAGOGASTRODUODENOSCOPY  11/07/2005   JOINT REPLACEMENT  2013   hip   kidney stones     in office procedure to remove but no sedation  surgeries for kidney stones    LUMBAR LAMINECTOMY Right 05/18/2014   Procedure: LUMBAR LAMINECTOMY Microdiscectomy Right Lumbar five - sacral one;  Surgeon: Oneil JAYSON Herald, MD;  Location: [facility] OR;  Service: Orthopedics;  Laterality: Right;   SPINE SURGERY     lower back   Stress Cardiolite   11/30/2004   SUBMUCOSAL TATTOO INJECTION  04/30/2022   Procedure: SUBMUCOSAL TATTOO INJECTION;  Surgeon: Avram Lupita BRAVO, MD;  Location: [facility];  Service: Gastroenterology;;   TOTAL HIP ARTHROPLASTY  10/28/2012   Procedure: TOTAL HIP ARTHROPLASTY;  Surgeon: Toribio JULIANNA Chancy, MD;  Location: [facility] OR;  Service: Orthopedics;  Laterality: Right;   UPPER GASTROINTESTINAL ENDOSCOPY     Social History   Socioeconomic History   Marital status: Single    Spouse name: Not on file   Number of children: 0   Years of education: Not on file   Highest education level: Not on file  Occupational History   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  Vaping Use   Vaping status: Never Used  Substance and Sexual Activity   Alcohol use: Yes    Comment: rare   Drug use: No   Sexual activity: Not Currently  Other Topics Concern   Not on file  Social History Narrative   Originally from [address], [place]   Lives alone with dog, has a couple of friends he can rely on for support.    Social Drivers of Corporate investment banker Strain: Low Risk  (01/14/2024)   Overall Financial Resource Strain (CARDIA)    Difficulty of Paying Living Expenses: Not very hard  Food Insecurity: No Food Insecurity (01/14/2024)   Hunger Vital Sign    Worried About Running Out of Food in the Last Year: Never true    Ran Out of  Food in the Last Year: Never true  Transportation Needs: No Transportation Needs (01/14/2024)   PRAPARE - Administrator, Civil Service (Medical): No    Lack of Transportation (Non-Medical): No  Physical Activity: Insufficiently Active (01/14/2024)   Exercise Vital Sign    Days of Exercise per Week: 2 days    Minutes of Exercise per  Session: 30 min  Stress: No Stress Concern Present (01/14/2024)   [employer] of Occupational Health - Occupational Stress Questionnaire    Feeling of Stress : Not at all  Social Connections: Socially Isolated (01/14/2024)   Social Connection and Isolation Panel    Frequency of Communication with Friends and Family: Twice a week    Frequency of Social Gatherings with Friends and Family: Twice a week    Attends Religious Services: Never    Database administrator or Organizations: No    Attends Banker Meetings: Never    Marital Status: Never married  Intimate Partner Violence: Not At Risk (01/14/2024)   Humiliation, Afraid, Rape, and Kick questionnaire    Fear of Current or Ex-Partner: No    Emotionally Abused: No    Physically Abused: No    Sexually Abused: No   Current Outpatient Medications on File Prior to Visit  Medication Sig Dispense Refill   Ascorbic Acid (VITAMIN C) 1000 MG tablet Take 2,000 mg by mouth daily.     aspirin  EC 81 MG tablet Take 1 tablet (81 mg total) by mouth daily. 30 tablet 11   atenolol  (TENORMIN ) 25 MG tablet Take 1 tablet (25 mg total) by mouth daily. 90 tablet 3   b complex vitamins tablet Take 1 tablet by mouth daily.     Biotin 5000 MCG TABS Take 5,000 mcg by mouth daily.     Cholecalciferol (VITAMIN D ) 2000 UNITS tablet Take 2,000 Units by mouth daily.     Coenzyme Q10 (CO Q 10) 100 MG CAPS Take 200 mg by mouth daily.      Cyanocobalamin  (VITAMIN B-12 PO) Take 1 tablet by mouth daily.     dapagliflozin  propanediol (FARXIGA ) 10 MG TABS tablet TAKE 1 TABLET BY MOUTH EVERY DAY BEFORE BREAKFAST 90 tablet  3   Evolocumab  (REPATHA  SURECLICK) 140 MG/ML SOAJ Inject 140 mg into the skin every 14 (fourteen) days. 6 mL 3   fenofibrate  (TRICOR ) 145 MG tablet Take 1 tablet (145 mg total) by mouth daily. 90 tablet 3   Ginkgo Biloba (GINKOBA PO) Take 1 tablet by mouth daily.     glucose blood (ONETOUCH VERIO) test strip USE AS DIRECTED to check blood sugar 1X daily 100 strip 3   icosapent  Ethyl (VASCEPA ) 1 g capsule Take 2 capsules (2 g total) by mouth 2 (two) times daily. 360 capsule 3   lisinopril  (ZESTRIL ) 40 MG tablet Take 1 tablet (40 mg total) by mouth daily. 90 tablet 3   Magnesium  400 MG CAPS Take 400 mg by mouth daily.      metFORMIN  (GLUCOPHAGE -XR) 500 MG 24 hr tablet TAKE 2 TABLETS BY MOUTH EVERY DAY 180 tablet 3   methocarbamol  (ROBAXIN ) 500 MG tablet Take 1 tablet (500 mg total) by mouth every 8 (eight) hours as needed for muscle spasms. 300 tablet 0   Multiple Vitamin (MULTIVITAMIN) tablet Take 1 tablet by mouth daily.     nitroGLYCERIN  (NITROSTAT ) 0.4 MG SL tablet Place 1 tablet (0.4 mg total) under the tongue every 5 (five) minutes as needed for chest pain. 30 tablet 11   pantoprazole  (PROTONIX ) 40 MG tablet TAKE 1 TABLET BY MOUTH EVERY DAY 90 tablet 3   rosuvastatin  (CRESTOR ) 5 MG tablet Take 0.5 tablets (2.5 mg total) by mouth daily. 45 tablet 1   RYBELSUS  14 MG TABS TAKE 1 TABLET (14 MG TOTAL) BY MOUTH DAILY 90 tablet 3   testosterone  (ANDROGEL ) 50 MG/5GM (1%) GEL APPLY 50  MG (ONE PACKET) TOPICALLY DAILY. 150 g 0   Zinc 50 MG TABS Take 50 mg by mouth daily.     Current Facility-Administered Medications on File Prior to Visit  Medication Dose Route Frequency Provider Last Rate Last Admin   regadenoson  (LEXISCAN ) injection SOLN 0.4 mg  0.4 mg Intravenous Once Kate Lonni CROME, MD       Allergies  Allergen Reactions   Actos [Pioglitazone Hydrochloride]     Weight gain   Family History  Problem Relation Age of Onset   Colon cancer Mother        Colon Cancer-at advanced age- in  her 80's per pt    Cancer Mother    Diabetes Mother    Heart disease Father    Breast cancer Sister    Cancer Sister    Diabetes Paternal Grandfather    Esophageal cancer Neg Hx    Rectal cancer Neg Hx    Stomach cancer Neg Hx    PE: BP 130/70   Pulse 73   Ht 5' 7 (1.702 m)   Wt 165 lb 6.4 oz (75 kg)   SpO2 95%   BMI 25.91 kg/m  Wt Readings from Last 3 Encounters:  06/15/24 165 lb 6.4 oz (75 kg)  01/14/24 176 lb 1.6 oz (79.9 kg)  10/13/23 185 lb (83.9 kg)   Constitutional: Normal weight, in NAD Eyes: EOMI, no exophthalmos ENT:  no thyromegaly, no cervical lymphadenopathy Cardiovascular: RRR, No MRG Respiratory: CTA B Musculoskeletal: no deformities Skin:  no rashes Neurological: no tremor with outstretched hands Diabetic Foot Exam - Simple   Simple Foot Form Diabetic Foot exam was performed with the following findings: Yes 06/15/2024  3:18 PM  Visual Inspection No deformities, no ulcerations, no other skin breakdown bilaterally: Yes Sensation Testing Intact to touch and monofilament testing bilaterally: Yes Pulse Check Posterior Tibialis and Dorsalis pulse intact bilaterally: Yes Comments    ASSESSMENT: 1. DM2, noninsulin-dependent, uncontrolled, with complications - CAD  2. HL  PLAN:  1. Patient with longstanding, well-controlled type 2 diabetes, on oral antidiabetic regimen with metformin , SGLT2 inhibitor and GLP-1 receptor agonist, with good control.  At last visit, HbA1c was lower, at 5.9% but since then he had another HbA1c 5 months ago and this was slightly higher, but still at goal, at 6.1%.  At last visit, he was not checking blood sugars as he did not have a meter anymore.  We gave him a meter and advised him to check at different times of the day, once a day.  We did not change his regimen at that time.  He did mention decreased appetite but he was not interested in reducing the Rybelsus  dose. - At today's visit, sugars are controlled, but he is not taking  sufficiently.  I did advise him to try to check every day if possible, rotating check times.  We also discussed about decreasing the Rybelsus  dose but he is reticent to do so.  Will go ahead and decrease the metformin  dose for now.   - I suggested to:  Patient Instructions  Please decrease: - Metformin  ER 500 mg 1x a day, with meals  Continue: - Farxiga  10 mg before breakfast - Rybelsus  14 mg before breakfast  Please return in 4-6 months with your meter.  - we checked his HbA1c: 5.6% (lower) - advised to check sugars at different times of the day - 1x a day, rotating check times - advised for yearly eye exams >> he is UTD -  return to clinic in 4-6 months  2. HL - Latest lipid panel showed fractions at goal at last visit: Lab Results  Component Value Date   CHOL 122 01/14/2024   HDL 61.30 01/14/2024   LDLCALC 30 01/14/2024   LDLDIRECT 26.0 01/14/2023   TRIG 157.0 (H) 01/14/2024   CHOLHDL 2 01/14/2024  -He continues on Crestor  2.5 mg daily, Vascepa  2 g twice a day, fenofibrate  145 mg daily, Repatha , co-Q10 -tolerated well  Lela Fendt, MD PhD [facility] End[facility]

## 2024-06-17 ENCOUNTER — Other Ambulatory Visit: Payer: Self-pay | Admitting: Internal Medicine

## 2024-06-17 ENCOUNTER — Other Ambulatory Visit (HOSPITAL_COMMUNITY): Payer: Self-pay

## 2024-06-17 MED ORDER — REPATHA SURECLICK 140 MG/ML ~~LOC~~ SOAJ
140.0000 mg | SUBCUTANEOUS | 3 refills | Status: AC
  Filled 2024-06-17: qty 6, 84d supply, fill #0

## 2024-06-17 NOTE — Telephone Encounter (Signed)
*  STAT* If patient is at the pharmacy, call can be transferred to refill team.   1. Which medications need to be refilled? (please list name of each medication and dose if known) Evolocumab  (REPATHA  SURECLICK) 140 MG/ML SOAJ    2. Would you like to learn more about the convenience, safety, & potential cost savings by using the [facility]? No   3. Are you open to using the [facility] (Type [facility].) No   4. Which pharmacy/location (including street and city if local pharmacy) is medication to be sent to? [id number] [place], [place] - [address]    5. Do they need a 30 day or 90 day supply? 90 day

## 2024-07-20 ENCOUNTER — Ambulatory Visit (HOSPITAL_COMMUNITY)
Admission: RE | Admit: 2024-07-20 | Discharge: 2024-07-20 | Disposition: A | Discharge status: Home / Self Care | Source: Ambulatory Visit | Attending: Internal Medicine | Admitting: Internal Medicine

## 2024-07-20 DIAGNOSIS — I77819 Aortic ectasia, unspecified site: Secondary | ICD-10-CM | POA: Insufficient documentation

## 2024-07-20 DIAGNOSIS — I10 Essential (primary) hypertension: Secondary | ICD-10-CM | POA: Diagnosis not present

## 2024-07-20 DIAGNOSIS — I6523 Occlusion and stenosis of bilateral carotid arteries: Secondary | ICD-10-CM | POA: Diagnosis not present

## 2024-07-23 ENCOUNTER — Other Ambulatory Visit: Payer: Self-pay | Admitting: Medical Genetics

## 2024-07-26 ENCOUNTER — Ambulatory Visit: Payer: Self-pay

## 2024-07-26 DIAGNOSIS — I77819 Aortic ectasia, unspecified site: Secondary | ICD-10-CM

## 2024-07-28 ENCOUNTER — Other Ambulatory Visit: Payer: Self-pay | Admitting: Internal Medicine

## 2024-07-28 DIAGNOSIS — R7989 Other specified abnormal findings of blood chemistry: Secondary | ICD-10-CM

## 2024-08-09 ENCOUNTER — Other Ambulatory Visit (HOSPITAL_COMMUNITY): Payer: Self-pay

## 2024-09-13 ENCOUNTER — Encounter: Payer: Self-pay | Admitting: Radiology

## 2024-09-16 ENCOUNTER — Other Ambulatory Visit: Payer: Self-pay | Admitting: Internal Medicine

## 2024-09-21 ENCOUNTER — Encounter: Payer: Self-pay | Admitting: Internal Medicine

## 2024-10-01 ENCOUNTER — Ambulatory Visit (HOSPITAL_COMMUNITY)
Admission: RE | Admit: 2024-10-01 | Discharge: 2024-10-01 | Disposition: A | Discharge status: Home / Self Care | Source: Ambulatory Visit | Attending: Internal Medicine | Admitting: Internal Medicine

## 2024-10-01 DIAGNOSIS — I6523 Occlusion and stenosis of bilateral carotid arteries: Secondary | ICD-10-CM | POA: Diagnosis present

## 2024-10-01 DIAGNOSIS — I77819 Aortic ectasia, unspecified site: Secondary | ICD-10-CM | POA: Diagnosis present

## 2024-10-01 DIAGNOSIS — I4510 Unspecified right bundle-branch block: Secondary | ICD-10-CM | POA: Diagnosis not present

## 2024-10-01 DIAGNOSIS — I10 Essential (primary) hypertension: Secondary | ICD-10-CM | POA: Diagnosis present

## 2024-10-01 LAB — ECHOCARDIOGRAM COMPLETE
Area-P 1/2: 2.66 cm2
S' Lateral: 2.48 cm

## 2024-10-03 ENCOUNTER — Other Ambulatory Visit: Payer: Self-pay | Admitting: Internal Medicine

## 2024-10-04 ENCOUNTER — Encounter (HOSPITAL_BASED_OUTPATIENT_CLINIC_OR_DEPARTMENT_OTHER): Payer: Self-pay

## 2024-10-05 ENCOUNTER — Ambulatory Visit: Attending: Internal Medicine | Admitting: Internal Medicine

## 2024-10-05 ENCOUNTER — Encounter: Payer: Self-pay | Admitting: Internal Medicine

## 2024-10-05 VITALS — BP 128/78 | HR 67 | Ht 67.0 in | Wt 168.4 lb

## 2024-10-05 DIAGNOSIS — E782 Mixed hyperlipidemia: Secondary | ICD-10-CM

## 2024-10-05 DIAGNOSIS — I10 Essential (primary) hypertension: Secondary | ICD-10-CM

## 2024-10-05 DIAGNOSIS — I77819 Aortic ectasia, unspecified site: Secondary | ICD-10-CM

## 2024-10-05 DIAGNOSIS — I6523 Occlusion and stenosis of bilateral carotid arteries: Secondary | ICD-10-CM

## 2024-10-05 NOTE — Patient Instructions (Signed)
 Medication Instructions:  Your physician recommends that you continue on your current medications as directed. Please refer to the Current Medication list given to you today.   *If you need a refill on your cardiac medications before your next appointment, please call your pharmacy*  Testing/Procedures: Your physician has requested that you have an echocardiogram. Echocardiography is a painless test that uses sound waves to create images of your heart. It provides your doctor with information about the size and shape of your heart and how well your heart's chambers and valves are working. This procedure takes approximately one hour. There are no restrictions for this procedure. Please do NOT wear cologne, perfume, aftershave, or lotions (deodorant is allowed). Please arrive 15 minutes prior to your appointment time.  Please note: We ask at that you not bring children with you during ultrasound (echo/ vascular) testing. Due to room size and safety concerns, children are not allowed in the ultrasound rooms during exams. Our front office staff cannot provide observation of children in our lobby area while testing is being conducted. An adult accompanying a patient to their appointment will only be allowed in the ultrasound room at the discretion of the ultrasound technician under special circumstances. We apologize for any inconvenience.   Follow-Up: At [facility], you and your health needs are our priority.  As part of our continuing mission to provide you with exceptional heart care, our providers are all part of one team.  This team includes your primary Cardiologist (physician) and Advanced Practice Providers or APPs (Physician Assistants and Nurse Practitioners) who all work together to provide you with the care you need, when you need it.  Your next appointment:   1 year(s) (after echo)  Provider:   Stanly DELENA Leavens, MD    We recommend signing up for the patient portal called  MyChart.  Sign up information is provided on this After Visit Summary.  MyChart is used to connect with patients for Virtual Visits (Telemedicine).  Patients are able to view lab/test results, encounter notes, upcoming appointments, etc.  Non-urgent messages can be sent to your provider as well.   To learn more about what you can do with MyChart, go to [email or web address].

## 2024-10-05 NOTE — Progress Notes (Signed)
 Cardiology Office Note:    Date:  10/05/2024   ID:  [name] [name], DOB September 14, 1951, MRN [id number]  PCP:  Theophilus Andrews, Tully GRADE, MD   [facility] Providers Cardiologist:  Stanly DELENA Leavens, MD {  Referring MD: Theophilus Andrews, [name]*   History of Present Illness:    [name] [name] is a 73 y.o. male with a hx of CAD s/p PCI following positive stress test in 2006, HTN, HLD, DMII and tobacco use who was previously followed by Dr. Maranda who now returns to clinic for follow-up with Dr. Hobart. Had GI bleeds. 2023: Found to have mild aortic dilation  Discussed the use of AI scribe software for clinical note transcription with the patient, who gave verbal consent to proceed.  [name] [name] is a 73 year old male with coronary artery disease who presents for follow-up of his cardiovascular health.  He feels fine and has not experienced any chest pain or twinges recently. He has a history of bleeding due to prolonged use of Plavix , which led to a significant drop in hemoglobin levels, requiring emergency care. No further bleeding episodes have occurred since then.  No palpitations or related symptoms. He continues to take atenolol , which remains effective for him.  He recalls a previous finding of a mildly dilated aorta.  He has a family history of heart trouble, noting that his father passed away from similar issues in 1964. He continues to smoke cigarettes. He mentions having an annual low dose CT scan and regular follow-ups with specialists to monitor his condition. He is due for another test in June to assess his aortic condition further.   Past Medical History:  Diagnosis Date   Anemia, iron deficiency 11/18/2008   Resolved, despite no rx--2012     ANEMIA-IRON DEFICIENCY    Arthritis    BACK PAIN, LUMBAR    Blood transfusion without reported diagnosis    Cancer (HCC)    skin   Cataract 2023   CORONARY ARTERY DISEASE    DIVERTICULOSIS, COLON    Elevated LFTs  11/18/2015   GERD    Hepatitis    States he was tested for Hep B and he had antibodies   HYPERLIPIDEMIA    HYPERTENSION    dr tom wall   Nephrolithiasis    PONV (postoperative nausea and vomiting)    little nausea after having his hip replacement   Type 2 diabetes mellitus (HCC)    Unspecified disorder of urethra and urinary tract     Past Surgical History:  Procedure Laterality Date   COLONOSCOPY     CORONARY ANGIOPLASTY WITH STENT PLACEMENT     06    dr t. wall   ELECTROCARDIOGRAM  03/30/2007   ENTEROSCOPY N/A 04/30/2022   Procedure: ENTEROSCOPY;  Surgeon: Avram Lupita BRAVO, MD;  Location: [facility];  Service: Gastroenterology;  Laterality: N/A;   ESOPHAGOGASTRODUODENOSCOPY  11/07/2005   JOINT REPLACEMENT  2013   hip   kidney stones     in office procedure to remove but no sedation surgeries for kidney stones    LUMBAR LAMINECTOMY Right 05/18/2014   Procedure: LUMBAR LAMINECTOMY Microdiscectomy Right Lumbar five - sacral one;  Surgeon: Oneil JAYSON Herald, MD;  Location: [facility] OR;  Service: Orthopedics;  Laterality: Right;   SPINE SURGERY     lower back   Stress Cardiolite   11/30/2004   SUBMUCOSAL TATTOO INJECTION  04/30/2022   Procedure: SUBMUCOSAL TATTOO INJECTION;  Surgeon: Avram Lupita BRAVO, MD;  Location: [facility];  Service: Gastroenterology;;   TOTAL HIP ARTHROPLASTY  10/28/2012   Procedure: TOTAL HIP ARTHROPLASTY;  Surgeon: Toribio JULIANNA Chancy, MD;  Location[facility] OR;  Service: Orthopedics;  Laterality: Right;   UPPER GASTROINTESTINAL ENDOSCOPY      Current Medications: Current Meds  Medication Sig   Accu-Chek Softclix Lancets lancets Use as instructed 1x a day   Ascorbic Acid (VITAMIN C) 1000 MG tablet Take 2,000 mg by mouth daily.   aspirin  EC 81 MG tablet Take 1 tablet (81 mg total) by mouth daily.   atenolol  (TENORMIN ) 25 MG tablet Take 1 tablet (25 mg total) by mouth daily.   b complex vitamins tablet Take 1 tablet by mouth daily.   Biotin 5000 MCG TABS Take 5,000 mcg  by mouth daily.   Blood Glucose Monitoring Suppl (ACCU-CHEK GUIDE) w/Device KIT Use as advised   Cholecalciferol (VITAMIN D ) 2000 UNITS tablet Take 2,000 Units by mouth daily.   Coenzyme Q10 (CO Q 10) 100 MG CAPS Take 200 mg by mouth daily.    Cyanocobalamin  (VITAMIN B-12 PO) Take 1 tablet by mouth daily.   dapagliflozin  propanediol (FARXIGA ) 10 MG TABS tablet TAKE 1 TABLET BY MOUTH EVERY DAY BEFORE BREAKFAST   Evolocumab  (REPATHA  SURECLICK) 140 MG/ML SOAJ Inject 140 mg into the skin every 14 (fourteen) days.   fenofibrate  (TRICOR ) 145 MG tablet TAKE 1 TABLET BY MOUTH EVERY DAY   Ginkgo Biloba (GINKOBA PO) Take 1 tablet by mouth daily.   glucose blood test strip Use as instructed 1x a day   icosapent  Ethyl (VASCEPA ) 1 g capsule Take 2 capsules (2 g total) by mouth 2 (two) times daily.   lisinopril  (ZESTRIL ) 40 MG tablet Take 1 tablet (40 mg total) by mouth daily.   Magnesium  400 MG CAPS Take 400 mg by mouth daily.    metFORMIN  (GLUCOPHAGE -XR) 500 MG 24 hr tablet Take 1 tablet (500 mg total) by mouth daily.   methocarbamol  (ROBAXIN ) 500 MG tablet Take 1 tablet (500 mg total) by mouth every 8 (eight) hours as needed for muscle spasms.   Multiple Vitamin (MULTIVITAMIN) tablet Take 1 tablet by mouth daily.   nitroGLYCERIN  (NITROSTAT ) 0.4 MG SL tablet Place 1 tablet (0.4 mg total) under the tongue every 5 (five) minutes as needed for chest pain.   pantoprazole  (PROTONIX ) 40 MG tablet TAKE 1 TABLET BY MOUTH EVERY DAY   rosuvastatin  (CRESTOR ) 5 MG tablet Take 0.5 tablets (2.5 mg total) by mouth daily.   RYBELSUS  14 MG TABS TAKE 1 TABLET (14 MG TOTAL) BY MOUTH DAILY   testosterone  (ANDROGEL ) 50 MG/5GM (1%) GEL APPLY 50 MG (ONE PACKET) TOPICALLY DAILY.   Zinc 50 MG TABS Take 50 mg by mouth daily.     Allergies:   Actos [pioglitazone hydrochloride]   Social History   Socioeconomic History   Marital status: Single    Spouse name: Not on file   Number of children: 0   Years of education: Not on  file   Highest education level: Not on file  Occupational History   Occupation: Facilities Manager: [employer]    Comment: works Naval Architect   Occupation: Retired  Tobacco Use   Smoking status: Every Day    Current packs/day: 1.00    Average packs/day: 1 pack/day for 48.0 years (48.0 ttl pk-yrs)    Types: Cigarettes   Smokeless tobacco: Never   Tobacco comments:    states he wants to reduce his dependence.  Vaping Use   Vaping  status: Never Used  Substance and Sexual Activity   Alcohol use: Yes    Comment: rare   Drug use: No   Sexual activity: Not Currently  Other Topics Concern   Not on file  Social History Narrative   Originally from [address], [place]   Lives alone with dog, has a couple of friends he can rely on for support.    Social Drivers of Corporate Investment Banker Strain: Low Risk  (01/14/2024)   Overall Financial Resource Strain (CARDIA)    Difficulty of Paying Living Expenses: Not very hard  Food Insecurity: No Food Insecurity (01/14/2024)   Hunger Vital Sign    Worried About Running Out of Food in the Last Year: Never true    Ran Out of Food in the Last Year: Never true  Transportation Needs: No Transportation Needs (01/14/2024)   PRAPARE - Administrator, Civil Service (Medical): No    Lack of Transportation (Non-Medical): No  Physical Activity: Insufficiently Active (01/14/2024)   Exercise Vital Sign    Days of Exercise per Week: 2 days    Minutes of Exercise per Session: 30 min  Stress: No Stress Concern Present (01/14/2024)   [employer] of Occupational Health - Occupational Stress Questionnaire    Feeling of Stress : Not at all  Social Connections: Socially Isolated (01/14/2024)   Social Connection and Isolation Panel    Frequency of Communication with Friends and Family: Twice a week    Frequency of Social Gatherings with Friends and Family: Twice a week    Attends Religious Services: Never    Database Administrator or  Organizations: No    Attends Engineer, Structural: Never    Marital Status: Never married     Family History: The patient's family history includes Breast cancer in his sister; Cancer in his mother and sister; Colon cancer in his mother; Diabetes in his mother and paternal grandfather; Heart disease in his father. There is no history of Esophageal cancer, Rectal cancer, or Stomach cancer.  ROS:   Please see the history of present illness.     EKGs/Labs/Other Studies Reviewed:    Cardiac Studies & Procedures   ______________________________________________________________________________________________   STRESS TESTS  MYOCARDIAL PERFUSION IMAGING 08/27/2019  Interpretation Summary  Inconclusive ECG with significant motion artifact. Diffuse upsloping ST depressions, appear to become horizontal in V3 and V4 leads.  Nuclear stress EF: 60%.  The left ventricular ejection fraction is normal (55-65%).  Defect 1: There is a medium fixed defect of mild severity present in the basal inferior, mid inferior and apical inferior location. Given normal wall motion in this region, consistent with artifact  The study is normal.  This is a low risk study.   ECHOCARDIOGRAM  ECHOCARDIOGRAM COMPLETE 10/01/2024  Narrative ECHOCARDIOGRAM REPORT    Patient Name:   [name] [name] Date of Exam: 10/01/2024 Medical Rec #:  [id number]       Height:       67.0 in Accession #:    [id number]      Weight:       165.4 lb Date of Birth:  1951/06/28       BSA:          1.865 m Patient Age:    73 years        BP:           130/70 mmHg Patient Gender: M  HR:           70 bpm. Exam Location:  Outpatient  Procedure: 2D Echo, 3D Echo, Color Doppler, Cardiac Doppler and Strain Analysis (Both Spectral and Color Flow Doppler were utilized during procedure).  Indications:    Aortic Dilatation  History:        Patient has prior history of Echocardiogram examinations, most recent  09/22/2023. Arrythmias:RBBB; Risk Factors:Hypertension, Dyslipidemia and Current Smoker.  Sonographer:    Orvil Holmes RDCS Referring Phys: [id number] [facility] Janissa Bertram  IMPRESSIONS   1. Left ventricular ejection fraction, by estimation, is 60 to 65%. The left ventricle has normal function. The left ventricle has no regional wall motion abnormalities. Left ventricular diastolic parameters were normal. The average left ventricular global longitudinal strain is -12.5 %. The global longitudinal strain is abnormal. 2. Right ventricular systolic function is normal. The right ventricular size is normal. Mildly increased right ventricular wall thickness. 3. The mitral valve is grossly normal. No evidence of mitral valve regurgitation. No evidence of mitral stenosis. 4. The aortic valve is calcified. Unable to determine aortic valve morphology due to image quality. Aortic valve regurgitation is not visualized. Aortic valve sclerosis is present, with no evidence of aortic valve stenosis. 5. Aortic dilatation noted. There is dilatation of the aortic root, measuring 43 mm. There is dilatation of the ascending aorta, measuring 39 mm. 6. The inferior vena cava is normal in size with greater than 50% respiratory variability, suggesting right atrial pressure of 3 mmHg.  Comparison(s): A prior study was performed on 09/22/2023. Aortic root 42mm and ascending aorta 40mm.  FINDINGS Left Ventricle: Left ventricular ejection fraction, by estimation, is 60 to 65%. The left ventricle has normal function. The left ventricle has no regional wall motion abnormalities. The average left ventricular global longitudinal strain is -12.5 %. Strain was performed and the global longitudinal strain is abnormal. 3D ejection fraction reviewed and evaluated as part of the interpretation. Alternate measurement of EF is felt to be most reflective of LV function. The left ventricular internal cavity size was normal in size.  There is no left ventricular hypertrophy. Left ventricular diastolic parameters were normal.  Right Ventricle: The right ventricular size is normal. Mildly increased right ventricular wall thickness. Right ventricular systolic function is normal.  Left Atrium: Left atrial size was normal in size.  Right Atrium: Right atrial size was normal in size.  Pericardium: There is no evidence of pericardial effusion.  Mitral Valve: The mitral valve is grossly normal. No evidence of mitral valve regurgitation. No evidence of mitral valve stenosis.  Tricuspid Valve: The tricuspid valve is grossly normal. Tricuspid valve regurgitation is not demonstrated. No evidence of tricuspid stenosis.  Aortic Valve: The aortic valve is calcified. Aortic valve regurgitation is not visualized. Aortic valve sclerosis is present, with no evidence of aortic valve stenosis.  Pulmonic Valve: The pulmonic valve was not well visualized. Pulmonic valve regurgitation is not visualized.  Aorta: Aortic dilatation noted. There is dilatation of the aortic root, measuring 43 mm. There is dilatation of the ascending aorta, measuring 39 mm.  Venous: The inferior vena cava is normal in size with greater than 50% respiratory variability, suggesting right atrial pressure of 3 mmHg.  IAS/Shunts: The atrial septum is grossly normal.   LEFT VENTRICLE PLAX 2D LVIDd:         4.66 cm   Diastology LVIDs:         2.48 cm   LV e' medial:    6.42 cm/s LV  PW:         1.31 cm   LV E/e' medial:  8.1 LV IVS:        1.00 cm   LV e' lateral:   8.38 cm/s LVOT diam:     1.98 cm   LV E/e' lateral: 6.2 LV SV:         60 LV SV Index:   32        2D Longitudinal Strain LVOT Area:     3.08 cm  2D Strain GLS Avg:     -12.5 %  3D Volume EF: 3D EF:        46 % LV EDV:       93 ml LV ESV:       50 ml LV SV:        43 ml  RIGHT VENTRICLE RV Basal diam:  3.93 cm     PULMONARY VEINS RV Mid diam:    3.27 cm     A Reversal Velocity: 59.20 cm/s RV  S prime:     11.10 cm/s  Diastolic Velocity:  25.10 cm/s TAPSE (M-mode): 3.7 cm      S/D Velocity:        2.40 Systolic Velocity:   61.10 cm/s  LEFT ATRIUM             Index        RIGHT ATRIUM           Index LA diam:        4.00 cm 2.14 cm/m   RA Area:     13.10 cm LA Vol (A2C):   38.9 ml 20.85 ml/m  RA Volume:   31.70 ml  16.99 ml/m LA Vol (A4C):   26.0 ml 13.94 ml/m LA Biplane Vol: 34.0 ml 18.23 ml/m AORTIC VALVE LVOT Vmax:   94.90 cm/s LVOT Vmean:  60.300 cm/s LVOT VTI:    0.196 m  AORTA Ao Root diam: 4.26 cm Ao Asc diam:  3.89 cm  MITRAL VALVE MV Area (PHT): 2.66 cm    SHUNTS MV Decel Time: 285 msec    Systemic VTI:  0.20 m MV E velocity: 51.90 cm/s  Systemic Diam: 1.98 cm MV A velocity: 74.90 cm/s MV E/A ratio:  0.69  Sunit Tolia Electronically signed by Madonna Large Signature Date/Time: 10/01/2024/9:53:11 PM    Final          ______________________________________________________________________________________________       Recent Labs: 01/14/2024: ALT 24; BUN 13; Creatinine, Ser 0.90; Hemoglobin 17.4; Platelets 231.0; Potassium 4.3; Sodium 143; TSH 1.53   Recent Lipid Panel    Component Value Date/Time   CHOL 122 01/14/2024 1451   CHOL 108 12/29/2023 1526   TRIG 157.0 (H) 01/14/2024 1451   HDL 61.30 01/14/2024 1451   HDL 56 12/29/2023 1526   CHOLHDL 2 01/14/2024 1451   VLDL 31.4 01/14/2024 1451   LDLCALC 30 01/14/2024 1451   LDLCALC 30 12/29/2023 1526   LDLDIRECT 26.0 01/14/2023 1354     Physical Exam:    VS:  BP 128/78   Pulse 67   Ht 5' 7 (1.702 m)   Wt 168 lb 6.4 oz (76.4 kg)   SpO2 95%   BMI 26.38 kg/m     Wt Readings from Last 3 Encounters:  10/05/24 168 lb 6.4 oz (76.4 kg)  06/15/24 165 lb 6.4 oz (75 kg)  01/14/24 176 lb 1.6 oz (79.9 kg)     GEN:  Well nourished, well developed in  no acute distress HEENT: Normal NECK: No JVD; soft carotid bruits CARDIAC: RRR, no murmurs, rubs, gallops RESPIRATORY:  CTAB, good air  movement ABDOMEN: Soft, non-tender, non-distended MUSCULOSKELETAL:  No edema; No deformity  SKIN: Warm and dry NEUROLOGIC:  Alert and oriented x 3 PSYCHIATRIC:  Normal affect   ASSESSMENT/PLAN:    Stable coronary artery disease Coronary artery disease is well-managed with no symptoms of angina or coronary disease. Blood pressure and cholesterol levels are well-controlled. - Continue current management and medications. - Will repeat echocardiogram in one year.  Stable bilateral carotid artery stenosis Bilateral carotid artery stenosis is well-managed with no new symptoms or changes in condition. - Continue current management and monitoring in 2027  Mild thoracic aortic dilation Thoracic aortic dilation is mildly dilated and stable compared to last year. No surgical intervention is indicated at this time. Surgical indication is at 55 mm, and current measurements do not meet this threshold. - Will repeat echocardiogram in one year.  Premature atrial contractions Noted on EKG. No symptoms of palpitations or atrial fibrillation. Current management with atenolol  is appropriate. - Continue atenolol .  Mixed hyperlipidemia Well-controlled with LDL levels at 30 mg/dL.  Essential hypertension Well-controlled with current medication regimen.  Tobacco use Continues to smoke. Offered assistance with smoking cessation if he decides to quit in the future. - Offered smoking cessation support if he decides to quit.  Stanly Leavens, MD FASE [facility] Cardiologist [facility]  [address] [place], [place] [address] [phone]  5:19 PM

## 2024-10-12 ENCOUNTER — Other Ambulatory Visit (HOSPITAL_COMMUNITY)
Admission: RE | Admit: 2024-10-12 | Discharge: 2024-10-12 | Disposition: A | Payer: Self-pay | Source: Ambulatory Visit | Attending: Medical Genetics | Admitting: Medical Genetics

## 2024-10-16 ENCOUNTER — Other Ambulatory Visit: Payer: Self-pay | Admitting: Internal Medicine

## 2024-10-18 ENCOUNTER — Other Ambulatory Visit: Payer: Self-pay | Admitting: Internal Medicine

## 2024-10-18 DIAGNOSIS — E785 Hyperlipidemia, unspecified: Secondary | ICD-10-CM

## 2024-10-20 ENCOUNTER — Other Ambulatory Visit: Payer: Self-pay | Admitting: Internal Medicine

## 2024-10-20 DIAGNOSIS — I10 Essential (primary) hypertension: Secondary | ICD-10-CM

## 2024-10-20 DIAGNOSIS — R7989 Other specified abnormal findings of blood chemistry: Secondary | ICD-10-CM

## 2024-10-20 DIAGNOSIS — E785 Hyperlipidemia, unspecified: Secondary | ICD-10-CM

## 2024-10-21 ENCOUNTER — Other Ambulatory Visit: Payer: Self-pay

## 2024-10-21 DIAGNOSIS — I10 Essential (primary) hypertension: Secondary | ICD-10-CM

## 2024-10-21 DIAGNOSIS — E785 Hyperlipidemia, unspecified: Secondary | ICD-10-CM

## 2024-10-21 MED ORDER — ROSUVASTATIN CALCIUM 5 MG PO TABS: 2.5000 mg | ORAL_TABLET | Freq: Every day | ORAL | 3 refills | Status: AC

## 2024-10-21 MED ORDER — LISINOPRIL 40 MG PO TABS: 40.0000 mg | ORAL_TABLET | Freq: Every day | ORAL | 3 refills | Status: AC

## 2024-10-22 LAB — GENECONNECT MOLECULAR SCREEN: Genetic Analysis Overall Interpretation: NEGATIVE

## 2024-11-08 NOTE — Progress Notes (Signed)
 [name] [name]                                          MRN: [id number]   11/08/2024   The [facility] Quality Team Specialist reviewed this patient medical record for the purposes of chart review for care gap closure. The following were reviewed: abstraction for care gap closure-glycemic status assessment.    [facility] Quality Team

## 2024-11-17 ENCOUNTER — Other Ambulatory Visit: Payer: Self-pay | Admitting: Internal Medicine

## 2024-11-20 ENCOUNTER — Other Ambulatory Visit: Payer: Self-pay | Admitting: Internal Medicine

## 2024-12-14 LAB — OPHTHALMOLOGY REPORT-SCANNED

## 2024-12-16 ENCOUNTER — Encounter: Payer: Self-pay | Admitting: Internal Medicine

## 2024-12-16 ENCOUNTER — Ambulatory Visit: Admitting: Internal Medicine

## 2024-12-16 VITALS — BP 132/80 | HR 68 | Ht 67.0 in | Wt 170.8 lb

## 2024-12-16 DIAGNOSIS — E1159 Type 2 diabetes mellitus with other circulatory complications: Secondary | ICD-10-CM

## 2024-12-16 DIAGNOSIS — E1169 Type 2 diabetes mellitus with other specified complication: Secondary | ICD-10-CM

## 2024-12-16 MED ORDER — DAPAGLIFLOZIN PROPANEDIOL 10 MG PO TABS: 10.0000 mg | ORAL_TABLET | Freq: Every day | ORAL | 3 refills | Status: AC

## 2024-12-16 MED ORDER — RYBELSUS 14 MG PO TABS: 1.0000 | ORAL_TABLET | Freq: Every day | ORAL | 3 refills | Status: AC

## 2024-12-16 NOTE — Patient Instructions (Addendum)
 Please continue: - Metformin  ER 500 mg 1x a day  - but move it to b'fast - Farxiga  10 mg before breakfast - Rybelsus  14 mg before breakfast  Please return in 4-6 months with your meter.

## 2024-12-16 NOTE — Progress Notes (Signed)
 Patient ID: [name] [name], male   DOB: April 20, 1951, 74 y.o.   MRN: [id number]  HPI: [name] [name] is a 74 y.o.-year-old male, returning for follow-up for DM2, dx in 2008, non-insulin -dependent, uncontrolled, with circulatory complications (CAD). Pt. previously saw Dr. Kassie, but last visit with me 6 months ago.  Interim history: No increased urination, blurry vision, nausea, chest pain. He lost approximately 30 pounds in the last 3 years through changes in his diet like eliminating chips and other snacks.  He was also reticent to decrease the dose of Rybelsus  as this helped reducing his appetite. He does have back pain: spondylosis with radiculopathy.  Reviewed HbA1c: Lab Results  Component Value Date   HGBA1C 5.6 06/15/2024   HGBA1C 6.1 01/14/2024   HGBA1C 5.9 (A) 10/13/2023   HGBA1C 6.2 (A) 04/08/2023   HGBA1C 6.2 01/14/2023   HGBA1C 5.9 (A) 10/11/2022   HGBA1C 5.8 (A) 04/11/2022   HGBA1C 7.1 (H) 01/10/2022   HGBA1C 6.8 (A) 09/26/2021   HGBA1C 6.8 (A) 03/22/2021   Pt is on a regimen of: - Metformin  ER 1000 >> 500 mg with dinner - Farxiga  10 mg before breakfast - Rybelsus  14 mg before breakfast He tried Actos but this caused weight gain.  Pt was checking his sugars 0 to once a day: - am: 144 >> n/c >>> 160 - 2h after b'fast: n/c  - before lunch: n/c >> 143 >> n/c - 2h after lunch: n/c >> 120 >> n/c - before dinner: 67-102 >> 84-104  >> 80s-125 >> 84-118 >> 61 >> 91 - 2h after dinner: n/c >> 99 >> n/c - bedtime: n/c >> 72-116 - nighttime: n/c Lowest sugar was 67 >> 84 >> 80s >> 80s; he has hypoglycemia awareness at 70.  Highest sugar was 200s (steroids) >> 123. He stays up until 3-4 am.   Glucometer: One Touch Verio  - no CKD, last BUN/creatinine:  Lab Results  Component Value Date   BUN 13 01/14/2024   BUN 17 02/27/2023   CREATININE 0.90 01/14/2024   CREATININE 0.87 02/27/2023   Lab Results  Component Value Date   MICRALBCREAT 14.8 01/14/2024   MICRALBCREAT  5.3 08/28/2012  On lisinopril  40 mg daily.  - + HL; last set of lipids: Lab Results  Component Value Date   CHOL 122 01/14/2024   HDL 61.30 01/14/2024   LDLCALC 30 01/14/2024   LDLDIRECT 26.0 01/14/2023   TRIG 157.0 (H) 01/14/2024   CHOLHDL 2 01/14/2024  On Repatha , Vascepa  2 g twice a day, Crestor  2.5 mg daily, fenofibrate  145 mg daily.  - last eye exam was on 12/14/2024: No DR. He had cataract sx. 11/2022.  - no numbness and tingling in his feet.  Last foot exam 06/15/2024.  He is on ASA 81.  He does have a history of NASH-on vitamin E, anemia, HTN, hypogonadism-on testosterone  injections, history of nephrolithiasis, diverticulosis.  ROS: + see HPI  Past Medical History:  Diagnosis Date   Anemia, iron deficiency 11/18/2008   Resolved, despite no rx--2012     ANEMIA-IRON DEFICIENCY    Arthritis    BACK PAIN, LUMBAR    Blood transfusion without reported diagnosis    Cancer (HCC)    skin   Cataract 2023   CORONARY ARTERY DISEASE    DIVERTICULOSIS, COLON    Elevated LFTs 11/18/2015   GERD    Hepatitis    States he was tested for Hep B and he had antibodies   HYPERLIPIDEMIA  HYPERTENSION    dr tom wall   Nephrolithiasis    PONV (postoperative nausea and vomiting)    little nausea after having his hip replacement   Type 2 diabetes mellitus (HCC)    Unspecified disorder of urethra and urinary tract    Past Surgical History:  Procedure Laterality Date   COLONOSCOPY     CORONARY ANGIOPLASTY WITH STENT PLACEMENT     06    dr t. wall   ELECTROCARDIOGRAM  03/30/2007   ENTEROSCOPY N/A 04/30/2022   Procedure: ENTEROSCOPY;  Surgeon: Avram Lupita BRAVO, MD;  Location: [facility];  Service: Gastroenterology;  Laterality: N/A;   ESOPHAGOGASTRODUODENOSCOPY  11/07/2005   JOINT REPLACEMENT  2013   hip   kidney stones     in office procedure to remove but no sedation surgeries for kidney stones    LUMBAR LAMINECTOMY Right 05/18/2014   Procedure: LUMBAR LAMINECTOMY  Microdiscectomy Right Lumbar five - sacral one;  Surgeon: Oneil JAYSON Herald, MD;  Location: [facility] OR;  Service: Orthopedics;  Laterality: Right;   SPINE SURGERY     lower back   Stress Cardiolite   11/30/2004   SUBMUCOSAL TATTOO INJECTION  04/30/2022   Procedure: SUBMUCOSAL TATTOO INJECTION;  Surgeon: Avram Lupita BRAVO, MD;  Location: [facility];  Service: Gastroenterology;;   TOTAL HIP ARTHROPLASTY  10/28/2012   Procedure: TOTAL HIP ARTHROPLASTY;  Surgeon: Toribio JULIANNA Chancy, MD;  Location: [facility] OR;  Service: Orthopedics;  Laterality: Right;   UPPER GASTROINTESTINAL ENDOSCOPY     Social History   Socioeconomic History   Marital status: Single    Spouse name: Not on file   Number of children: 0   Years of education: Not on file   Highest education level: Not on file  Occupational History   Occupation: Facilities Manager: [employer]    Comment: works Naval Architect   Occupation: Retired  Tobacco Use   Smoking status: Every Day    Current packs/day: 1.00    Average packs/day: 1 pack/day for 48.0 years (48.0 ttl pk-yrs)    Types: Cigarettes   Smokeless tobacco: Never   Tobacco comments:    states he wants to reduce his dependence.  Vaping Use   Vaping status: Never Used  Substance and Sexual Activity   Alcohol use: Yes    Comment: rare   Drug use: No   Sexual activity: Not Currently  Other Topics Concern   Not on file  Social History Narrative   Originally from [address], [place]   Lives alone with dog, has a couple of friends he can rely on for support.    Social Drivers of Health   Tobacco Use: High Risk (06/15/2024)   Patient History    Smoking Tobacco Use: Every Day    Smokeless Tobacco Use: Never    Passive Exposure: Not on file  Financial Resource Strain: Low Risk (01/14/2024)   Overall Financial Resource Strain (CARDIA)    Difficulty of Paying Living Expenses: Not very hard  Food Insecurity: No Food Insecurity (01/14/2024)   Hunger Vital Sign    Worried About Running Out  of Food in the Last Year: Never true    Ran Out of Food in the Last Year: Never true  Transportation Needs: No Transportation Needs (01/14/2024)   PRAPARE - Administrator, Civil Service (Medical): No    Lack of Transportation (Non-Medical): No  Physical Activity: Insufficiently Active (01/14/2024)   Exercise Vital Sign    Days of Exercise  per Week: 2 days    Minutes of Exercise per Session: 30 min  Stress: No Stress Concern Present (01/14/2024)   [employer] of Occupational Health - Occupational Stress Questionnaire    Feeling of Stress : Not at all  Social Connections: Socially Isolated (01/14/2024)   Social Connection and Isolation Panel    Frequency of Communication with Friends and Family: Twice a week    Frequency of Social Gatherings with Friends and Family: Twice a week    Attends Religious Services: Never    Database Administrator or Organizations: No    Attends Banker Meetings: Never    Marital Status: Never married  Intimate Partner Violence: Not At Risk (01/14/2024)   Humiliation, Afraid, Rape, and Kick questionnaire    Fear of Current or Ex-Partner: No    Emotionally Abused: No    Physically Abused: No    Sexually Abused: No  Depression (PHQ2-9): Low Risk (01/14/2024)   Depression (PHQ2-9)    PHQ-2 Score: 0  Alcohol Screen: Low Risk (01/14/2024)   Alcohol Screen    Last Alcohol Screening Score (AUDIT): 0  Housing: Low Risk (01/14/2024)   Housing Stability Vital Sign    Unable to Pay for Housing in the Last Year: No    Number of Times Moved in the Last Year: 0    Homeless in the Last Year: No  Utilities: Not At Risk (01/14/2024)   AHC Utilities    Threatened with loss of utilities: No  Health Literacy: Adequate Health Literacy (01/14/2024)   B1300 Health Literacy    Frequency of need for help with medical instructions: Never   Current Outpatient Medications on File Prior to Visit  Medication Sig Dispense Refill   Accu-Chek Softclix Lancets lancets  Use as instructed 1x a day 100 each 3   Ascorbic Acid (VITAMIN C) 1000 MG tablet Take 2,000 mg by mouth daily.     aspirin  EC 81 MG tablet Take 1 tablet (81 mg total) by mouth daily. 30 tablet 11   atenolol  (TENORMIN ) 25 MG tablet TAKE 1 TABLET (25 MG TOTAL) BY MOUTH DAILY. 90 tablet 3   b complex vitamins tablet Take 1 tablet by mouth daily.     Biotin 5000 MCG TABS Take 5,000 mcg by mouth daily.     Blood Glucose Monitoring Suppl (ACCU-CHEK GUIDE) w/Device KIT Use as advised 1 kit 0   Cholecalciferol (VITAMIN D ) 2000 UNITS tablet Take 2,000 Units by mouth daily.     Coenzyme Q10 (CO Q 10) 100 MG CAPS Take 200 mg by mouth daily.      Cyanocobalamin  (VITAMIN B-12 PO) Take 1 tablet by mouth daily.     dapagliflozin  propanediol (FARXIGA ) 10 MG TABS tablet TAKE 1 TABLET BY MOUTH EVERY DAY BEFORE BREAKFAST 90 tablet 3   Evolocumab  (REPATHA  SURECLICK) 140 MG/ML SOAJ Inject 140 mg into the skin every 14 (fourteen) days. 6 mL 3   fenofibrate  (TRICOR ) 145 MG tablet TAKE 1 TABLET BY MOUTH EVERY DAY 90 tablet 0   Ginkgo Biloba (GINKOBA PO) Take 1 tablet by mouth daily.     glucose blood test strip Use as instructed 1x a day 100 each 3   lisinopril  (ZESTRIL ) 40 MG tablet Take 1 tablet (40 mg total) by mouth daily. 90 tablet 3   Magnesium  400 MG CAPS Take 400 mg by mouth daily.      metFORMIN  (GLUCOPHAGE -XR) 500 MG 24 hr tablet Take 1 tablet a day by mouth with a  meal. 90 tablet 3   methocarbamol  (ROBAXIN ) 500 MG tablet Take 1 tablet (500 mg total) by mouth every 8 (eight) hours as needed for muscle spasms. 300 tablet 0   Multiple Vitamin (MULTIVITAMIN) tablet Take 1 tablet by mouth daily.     nitroGLYCERIN  (NITROSTAT ) 0.4 MG SL tablet Place 1 tablet (0.4 mg total) under the tongue every 5 (five) minutes as needed for chest pain. 30 tablet 11   pantoprazole  (PROTONIX ) 40 MG tablet TAKE 1 TABLET BY MOUTH EVERY DAY 90 tablet 0   rosuvastatin  (CRESTOR ) 5 MG tablet Take 0.5 tablets (2.5 mg total) by mouth  daily. 45 tablet 3   RYBELSUS  14 MG TABS TAKE 1 TABLET (14 MG TOTAL) BY MOUTH DAILY 90 tablet 3   testosterone  (ANDROGEL ) 50 MG/5GM (1%) GEL APPLY 50 MG (ONE PACKET) TOPICALLY DAILY. 150 g 0   VASCEPA  1 g capsule TAKE 2 CAPSULES BY MOUTH 2 TIMES DAILY. 360 capsule 3   Zinc 50 MG TABS Take 50 mg by mouth daily.     Current Facility-Administered Medications on File Prior to Visit  Medication Dose Route Frequency Provider Last Rate Last Admin   regadenoson  (LEXISCAN ) injection SOLN 0.4 mg  0.4 mg Intravenous Once Kate Lonni CROME, MD       Allergies  Allergen Reactions   Actos [Pioglitazone Hydrochloride]     Weight gain   Family History  Problem Relation Age of Onset   Colon cancer Mother        Colon Cancer-at advanced age- in her 63's per pt    Cancer Mother    Diabetes Mother    Heart disease Father    Breast cancer Sister    Cancer Sister    Diabetes Paternal Grandfather    Esophageal cancer Neg Hx    Rectal cancer Neg Hx    Stomach cancer Neg Hx    PE: BP 132/80   Pulse 68   Ht 5' 7 (1.702 m)   Wt 170 lb 12.8 oz (77.5 kg)   SpO2 95%   BMI 26.75 kg/m  Wt Readings from Last 20 Encounters:  12/16/24 170 lb 12.8 oz (77.5 kg)  10/05/24 168 lb 6.4 oz (76.4 kg)  06/15/24 165 lb 6.4 oz (75 kg)  01/14/24 176 lb 1.6 oz (79.9 kg)  10/13/23 185 lb (83.9 kg)  10/02/23 187 lb 12.8 oz (85.2 kg)  08/20/23 197 lb 3.2 oz (89.4 kg)  04/08/23 197 lb 3.2 oz (89.4 kg)  03/27/23 196 lb (88.9 kg)  01/13/23 193 lb 1.6 oz (87.6 kg)  01/10/23 193 lb (87.5 kg)  11/29/22 193 lb (87.5 kg)  10/11/22 193 lb 6.4 oz (87.7 kg)  09/18/22 193 lb 12.8 oz (87.9 kg)  06/11/22 198 lb (89.8 kg)  05/16/22 198 lb (89.8 kg)  04/30/22 198 lb (89.8 kg)  04/11/22 198 lb 9.6 oz (90.1 kg)  03/18/22 200 lb (90.7 kg)  02/19/22 200 lb 6.4 oz (90.9 kg)   Constitutional: Normal weight, in NAD Eyes: EOMI, no exophthalmos ENT:  no thyromegaly, no cervical lymphadenopathy Cardiovascular: RRR, No  MRG Respiratory: CTA B Musculoskeletal: no deformities Skin:  no rashes Neurological: no tremor with outstretched hands  ASSESSMENT: 1. DM2, noninsulin-dependent, uncontrolled, with complications - CAD  2. HL  PLAN:  1. Patient with longstanding, well-controlled, type 2 diabetes, on oral antidiabetic regimen with metformin , SGLT2 inhibitor, and GLP-1 receptor agonist, with good control.  At last visit, HbA1c was excellent, decreased to 5.6%.  At that time, sugars  were at goal but he was not checking them frequently enough and I advised him to check every day if possible, rotating check times.  We also discussed about decreasing the dose of Rybelsus  but he was reticent to do so.  We did go ahead and reduced the dose of metformin . - At today's visit, he is not checking sugars consistently, and mostly checks before his last meal of the day.  Sugars are mostly at goal, with only 1 hyperglycemic exception after a large breakfast, but otherwise with sugars in the lower half of the target range.  Since his lowest blood sugars are at night, I advised him to try to move metformin  in the morning but otherwise continue to the same regimen.  We did discuss about the possibility of stopping metformin  but we continued this due to the many salutary effects. - at today's visit, I refilled his Farxiga  and Rybelsus  - I suggested to:  Patient Instructions  Please continue: - Metformin  ER 500 mg 1x a day  - but move it to b'fast - Farxiga  10 mg before breakfast - Rybelsus  14 mg before breakfast  Please return in 4-6 months with your meter.  - we checked his HbA1c: 5.6% (stable) - advised to check sugars at different times of the day - 1x a day, rotating check times - advised for yearly eye exams >> he is UTD - return to clinic in 4-6 months  2. HL - Latest lipid panel showed fractions at goal at last visit: Lab Results  Component Value Date   CHOL 122 01/14/2024   HDL 61.30 01/14/2024   LDLCALC 30  01/14/2024   LDLDIRECT 26.0 01/14/2023   TRIG 157.0 (H) 01/14/2024   CHOLHDL 2 01/14/2024  - He continues on Crestor  2.5 mg daily, Vascepa  2 g twice a day, fenofibrate  145 mg daily, Repatha -tolerated well   Lela Fendt, MD PhD [facility] Endocrinology

## 2025-01-17 ENCOUNTER — Encounter: Admitting: Internal Medicine

## 2025-02-02 ENCOUNTER — Other Ambulatory Visit

## 2025-06-13 ENCOUNTER — Ambulatory Visit: Admitting: Internal Medicine
